# Patient Record
Sex: Female | Born: 1935 | Race: White | Hispanic: No | Smoking: Never smoker
Health system: Southern US, Community
[De-identification: ages and names within clinical notes are randomized; demographics above are authoritative.]

## PROBLEM LIST (undated history)

## (undated) DIAGNOSIS — Z950 Presence of cardiac pacemaker: Secondary | ICD-10-CM

## (undated) DIAGNOSIS — I10 Essential (primary) hypertension: Secondary | ICD-10-CM

## (undated) DIAGNOSIS — I2510 Atherosclerotic heart disease of native coronary artery without angina pectoris: Secondary | ICD-10-CM

## (undated) DIAGNOSIS — I4891 Unspecified atrial fibrillation: Secondary | ICD-10-CM

## (undated) DIAGNOSIS — E119 Type 2 diabetes mellitus without complications: Secondary | ICD-10-CM

## (undated) HISTORY — DX: Atherosclerotic heart disease of native coronary artery without angina pectoris: I25.10

## (undated) HISTORY — DX: Morbid (severe) obesity due to excess calories: E66.01

## (undated) HISTORY — DX: Presence of cardiac pacemaker: Z95.0

## (undated) HISTORY — DX: Unspecified atrial fibrillation: I48.91

## (undated) HISTORY — DX: Type 2 diabetes mellitus without complications: E11.9

## (undated) HISTORY — DX: Essential (primary) hypertension: I10

## (undated) NOTE — *Deleted (*Deleted)
[facility] PROGRESS NOTE    Progress Note  Kirsten Jensen  ZOX:[id number] DOB: Aug 15, 1932 DOA: 10/30/2020 PCP: Melida Quitter, MD     Brief Narrative:   Kirsten Jensen is an 40 y.o. female past medical history significant for chronic atrial fibrillation on Eliquis tachybradycardia syndrome status post pacemaker, essential hypertension, diabetes mellitus type 2, obstructive sleep apnea comes into the hospital due to abnormal labs her hemoglobin dropped from 11-8 she was found to be FOBT positive in the ED, started on IV Protonix drip.  Cardiology was consulted who recommended taking to new metoprolol and proceed with EGD and colonoscopy, GI was consulted the patient underwent EGD and was found to have a nonbleeding ulcer, underwent colonoscopy as that showed diverticulosis and 2 small polyps which were removed.  CT scan of the abdomen and pelvis was unremarkable.  GI did signed off her hemoglobin remained around 1:08 unit of packed red blood cells Eliquis resumed. Her discharge is anticipated on 11/05/2020  Assessment/Plan:   Active Problems:   Upper GI bleed   Chronic atrial fibrillation (HCC)  RN Pressure Injury Documentation:    Estimated body mass index is 49.38 kg/m as calculated from the following:   Height as of this encounter: 5\' 7"  (1.702 m).   Weight as of this encounter: 143 kg. Malnutrition Type:      Malnutrition Characteristics:      Nutrition Interventions:       DVT prophylaxis: *** Family Communication:*** Status is: Inpatient  {Inpatient:23812}  Dispo:  Patient From: Home  Planned Disposition: Home  Expected discharge date: 11/04/20  Medically stable for discharge: No         Code Status:     Code Status Orders  (From admission, onward)         Start     Ordered   10/31/20 0224  Full code  Continuous        10/31/20 0223        Code Status History    Date Active Date Inactive Code Status Order ID Comments User Context    04/05/2020 0332 04/06/2020 2053 Full Code [id number]  Joselyn Arrow, MD ED   Advance Care Planning Activity        IV Access:    Peripheral IV   Procedures and diagnostic studies:   CT ABDOMEN PELVIS W CONTRAST  Result Date: 11/04/2020 CLINICAL DATA:  Anemia. Possible upper GI bleed. 14 pound weight gain and chest discomfort. Candidiasis and duodenal ulcers demonstrated at endoscopy. EXAM: CT ABDOMEN AND PELVIS WITH CONTRAST TECHNIQUE: Multidetector CT imaging of the abdomen and pelvis was performed using the standard protocol following bolus administration of intravenous contrast. CONTRAST:  OMNIPAQUE IOHEXOL 300 MG/ML  SOLN COMPARISON:  None. FINDINGS: Lower chest: Small bilateral pleural effusions with basilar atelectasis. Hepatobiliary: No focal liver abnormality is seen. No gallstones, gallbladder wall thickening, or biliary dilatation. Pancreas: Diffuse fatty atrophy of the pancreas. No inflammatory change or focal mass identified. Spleen: Normal in size without focal abnormality. Adrenals/Urinary Tract: Adrenal glands are unremarkable. Kidneys are normal, without renal calculi, focal lesion, or hydronephrosis. Bladder is unremarkable. Stomach/Bowel: The stomach, small bowel, and colon are not abnormally distended. No wall thickening or inflammatory changes are identified. Diverticulosis of the sigmoid colon. No evidence of diverticulitis. Appendix is not identified. Vascular/Lymphatic: Aortic atherosclerosis. No enlarged abdominal or pelvic lymph nodes. Reproductive: Status post hysterectomy. No adnexal masses. Other: No free air or free fluid in the abdomen. Abdominal wall musculature appears intact. Musculoskeletal:  Degenerative changes in the spine. No destructive bone lesions. IMPRESSION: 1. No acute process demonstrated in the abdomen or pelvis. No evidence of bowel obstruction or inflammation. 2. Small bilateral pleural effusions with basilar atelectasis. 3. Diffuse fatty atrophy  of the pancreas. 4. Aortic atherosclerosis. Aortic Atherosclerosis (ICD10-I70.0). Electronically Signed   By: Burman Nieves M.D.   On: 11/04/2020 01:12     Medical Consultants:    None.  Anti-Infectives:   ***  Subjective:    Kirsten Jensen   Objective:    Vitals:   11/04/20 1945 11/05/20 0436 11/05/20 0500 11/05/20 0900  BP: 111/71 114/60  (!) 96/55  Pulse: 72 70  78  Resp: 18 19    Temp: 98.2 F (36.8 C) 98.5 F (36.9 C)  97.7 F (36.5 C)  TempSrc: Oral Oral  Oral  SpO2: 99% 100%  95%  Weight:   (!) 143 kg   Height:       SpO2: 95 % O2 Flow Rate (L/min): 3 L/min   Intake/Output Summary (Last 24 hours) at 11/05/2020 0944 Last data filed at 11/05/2020 0900 Gross per 24 hour  Intake 480 ml  Output 900 ml  Net -420 ml   Filed Weights   11/02/20 0359 11/02/20 1411 11/05/20 0500  Weight: (!) 142.2 kg (!) 142.2 kg (!) 143 kg    Exam: General exam: In no acute distress. Respiratory system: Good air movement and clear to auscultation. Cardiovascular system: S1 & S2 heard, RRR. No JVD, murmurs, rubs, gallops or clicks.  Gastrointestinal system: Abdomen is nondistended, soft and nontender.  Central nervous system: Alert and oriented. No focal neurological deficits. Extremities: No pedal edema. Skin: No rashes, lesions or ulcers Psychiatry: Judgement and insight appear normal. Mood & affect appropriate.    Data Reviewed:    Labs: Basic Metabolic Panel: Recent Labs  Lab 11/01/20 0216 11/01/20 0216 11/02/20 1610 11/02/20 9604 11/03/20 0940 11/03/20 0940 11/04/20 0051 11/05/20 0209  NA 137  --  139  --  138  --  135 137  K 3.5   < > 3.2*   < > 3.5   < > 3.3* 4.0  CL 100  --  99  --  99  --  98 98  CO2 26  --  30  --  26  --  26 28  GLUCOSE 192*  --  147*  --  261*  --  184* 106*  BUN 26*  --  18  --  15  --  18 20  CREATININE 1.02*  --  0.91  --  0.96  --  1.10* 0.87  CALCIUM 8.7*  --  8.8*  --  9.0  --  8.8* 9.3  MG 1.3*  --  1.8  --  1.8  --   1.7 1.5*  PHOS 2.4*  --  2.8  --  2.9  --  3.3 3.1   < > = values in this interval not displayed.   GFR Estimated Creatinine Clearance: 69 mL/min (by C-G formula based on SCr of 0.87 mg/dL). Liver Function Tests: Recent Labs  Lab 10/30/20 2027 10/31/20 0258 11/05/20 0209  AST 81* 71* 28  ALT 50* 47* 24  ALKPHOS 156* 155* 86  BILITOT 0.7 0.7 0.8  PROT 6.8 6.4* 5.8*  ALBUMIN 3.2* 3.1* 2.8*   No results for input(s): LIPASE, AMYLASE in the last 168 hours. No results for input(s): AMMONIA in the last 168 hours. Coagulation profile Recent Labs  Lab 10/31/20 0041  INR 2.0*   COVID-19 Labs  No results for input(s): DDIMER, FERRITIN, LDH, CRP in the last 72 hours.  Lab Results  Component Value Date   SARSCOV2NAA NEGATIVE 10/30/2020   SARSCOV2NAA NEGATIVE 04/05/2020    CBC: Recent Labs  Lab 10/30/20 2027 10/30/20 2027 10/31/20 0258 10/31/20 1035 11/03/20 0940 11/03/20 2057 11/04/20 0051 11/04/20 0635 11/05/20 0209  WBC 9.6  --  9.2  --  9.1  --  8.0  --  7.0  NEUTROABS  --   --  7.2  --   --   --   --   --   --   HGB 8.2*   < > 8.1*   < > 8.4* 8.2* 8.2* 8.0* 8.2*  HCT 26.7*   < > 25.9*   < > 27.7* 26.3* 26.4* 25.9* 26.5*  MCV 97.4  --  96.3  --  95.5  --  92.6  --  94.3  PLT 399  --  374  --  347  --  278  --  290   < > = values in this interval not displayed.   Cardiac Enzymes: No results for input(s): CKTOTAL, CKMB, CKMBINDEX, TROPONINI in the last 168 hours. BNP (last 3 results) No results for input(s): PROBNP in the last 8760 hours. CBG: Recent Labs  Lab 11/04/20 1659 11/04/20 1938 11/05/20 0000 11/05/20 0438 11/05/20 0815  GLUCAP 193* 199* 157* 108* 124*   D-Dimer: No results for input(s): DDIMER in the last 72 hours. Hgb A1c: No results for input(s): HGBA1C in the last 72 hours. Lipid Profile: No results for input(s): CHOL, HDL, LDLCALC, TRIG, CHOLHDL, LDLDIRECT in the last 72 hours. Thyroid function studies: No results for input(s): TSH,  T4TOTAL, T3FREE, THYROIDAB in the last 72 hours.  Invalid input(s): FREET3 Anemia work up: No results for input(s): VITAMINB12, FOLATE, FERRITIN, TIBC, IRON, RETICCTPCT in the last 72 hours. Sepsis Labs: Recent Labs  Lab 10/31/20 0258 11/03/20 0940 11/04/20 0051 11/05/20 0209  WBC 9.2 9.1 8.0 7.0   Microbiology Recent Results (from the past 240 hour(s))  Respiratory Panel by RT PCR (Flu A&B, Covid) - Nasopharyngeal Swab     Status: None   Collection Time: 10/30/20 10:52 PM   Specimen: Nasopharyngeal Swab; Nasopharyngeal(NP) swabs in vial transport medium  Result Value Ref Range Status   SARS Coronavirus 2 by RT PCR NEGATIVE NEGATIVE Final    Comment: (NOTE) SARS-CoV-2 target nucleic acids are NOT DETECTED.  The SARS-CoV-2 RNA is generally detectable in upper respiratoy specimens during the acute phase of infection. The lowest concentration of SARS-CoV-2 viral copies this assay can detect is 131 copies/mL. A negative result does not preclude SARS-Cov-2 infection and should not be used as the sole basis for treatment or other patient management decisions. A negative result may occur with  improper specimen collection/handling, submission of specimen other than nasopharyngeal swab, presence of viral mutation(s) within the areas targeted by this assay, and inadequate number of viral copies (<131 copies/mL). A negative result must be combined with clinical observations, patient history, and epidemiological information. The expected result is Negative.  Fact Sheet for Patients:  https://www.moore.com/  Fact Sheet for Healthcare Providers:  https://www.young.biz/  This test is no t yet approved or cleared by the [place] FDA and  has been authorized for detection and/or diagnosis of SARS-CoV-2 by FDA under an Emergency Use Authorization (EUA). This EUA will remain  in effect (meaning this test can be used) for the duration of the  COVID-19  declaration under Section 564(b)(1) of the Act, 21 U.S.C. section 360bbb-3(b)(1), unless the authorization is terminated or revoked sooner.     Influenza A by PCR NEGATIVE NEGATIVE Final   Influenza B by PCR NEGATIVE NEGATIVE Final    Comment: (NOTE) The Xpert Xpress SARS-CoV-2/FLU/RSV assay is intended as an aid in  the diagnosis of influenza from Nasopharyngeal swab specimens and  should not be used as a sole basis for treatment. Nasal washings and  aspirates are unacceptable for Xpert Xpress SARS-CoV-2/FLU/RSV  testing.  Fact Sheet for Patients: https://www.moore.com/  Fact Sheet for Healthcare Providers: https://www.young.biz/  This test is not yet approved or cleared by the [place] FDA and  has been authorized for detection and/or diagnosis of SARS-CoV-2 by  FDA under an Emergency Use Authorization (EUA). This EUA will remain  in effect (meaning this test can be used) for the duration of the  Covid-19 declaration under Section 564(b)(1) of the Act, 21  U.S.C. section 360bbb-3(b)(1), unless the authorization is  terminated or revoked. Performed at [facility], [address] [place], [place] [address]   Surgical pcr screen     Status: Abnormal   Collection Time: 11/01/20  5:02 AM   Specimen: Nasal Mucosa; Nasal Swab  Result Value Ref Range Status   MRSA, PCR NEGATIVE NEGATIVE Final   Staphylococcus aureus POSITIVE (A) NEGATIVE Final    Comment: (NOTE) The Xpert SA Assay (FDA approved for NASAL specimens in patients 20 years of age and older), is one component of a comprehensive surveillance program. It is not intended to diagnose infection nor to guide or monitor treatment. Performed at [facility], [address], [place], [place] [address]      Medications:   . apixaban  5 mg Oral BID  . buPROPion  100 mg Oral Daily  . chlorhexidine  15 mL Mouth Rinse BID  . Chlorhexidine Gluconate Cloth  6  each Topical Q0600  . influenza vaccine adjuvanted  0.5 mL Intramuscular Tomorrow-1000  . insulin aspart  0-9 Units Subcutaneous Q4H  . insulin detemir  10 Units Subcutaneous Daily  . levothyroxine  50 mcg Oral QAC breakfast  . mouth rinse  15 mL Mouth Rinse q12n4p  . metoprolol succinate  50 mg Oral Daily  . mupirocin ointment  1 application Nasal BID  . nystatin  5 mL Oral QID  . pantoprazole  40 mg Oral BID AC  . polyethylene glycol-electrolytes  4,000 mL Oral Once  . pravastatin  80 mg Oral Daily  . ramipril  5 mg Oral Daily  . torsemide  20 mg Oral Daily  . vitamin B-12  1,000 mcg Oral Daily   Continuous Infusions: . lactated ringers Stopped (11/01/20 0903)      LOS: 5 days   Marinda Elk  [facility]  11/05/2020, 9:44 AM

---

## 2019-09-11 ENCOUNTER — Ambulatory Visit (INDEPENDENT_AMBULATORY_CARE_PROVIDER_SITE_OTHER): Payer: Medicare Other | Admitting: Internal Medicine

## 2019-09-11 ENCOUNTER — Other Ambulatory Visit: Payer: Self-pay

## 2019-09-11 ENCOUNTER — Encounter: Payer: Self-pay | Admitting: Internal Medicine

## 2019-09-11 ENCOUNTER — Telehealth: Payer: Self-pay | Admitting: Emergency Medicine

## 2019-09-11 VITALS — BP 124/82 | HR 63 | Ht 67.0 in | Wt 303.0 lb

## 2019-09-11 DIAGNOSIS — I4891 Unspecified atrial fibrillation: Secondary | ICD-10-CM

## 2019-09-11 LAB — CUP PACEART INCLINIC DEVICE CHECK
Brady Statistic RA Percent Paced: 75 %
Brady Statistic RV Percent Paced: 20 %
Date Time Interrogation Session: 20200929040000
Implantable Lead Implant Date: 20191003
Implantable Lead Implant Date: 20191003
Implantable Lead Location: 753859
Implantable Lead Location: 753860
Implantable Lead Model: 7741
Implantable Lead Model: 7742
Implantable Lead Serial Number: 1047491
Implantable Lead Serial Number: 1074664
Implantable Pulse Generator Implant Date: 20191003
Lead Channel Impedance Value: 701 Ohm
Lead Channel Impedance Value: 788 Ohm
Lead Channel Pacing Threshold Amplitude: 0.7 V
Lead Channel Pacing Threshold Amplitude: 0.7 V
Lead Channel Pacing Threshold Pulse Width: 0.4 ms
Lead Channel Pacing Threshold Pulse Width: 0.4 ms
Lead Channel Sensing Intrinsic Amplitude: 2.5 mV
Lead Channel Sensing Intrinsic Amplitude: 8.6 mV
Lead Channel Setting Pacing Amplitude: 2 V
Lead Channel Setting Pacing Amplitude: 2 V
Lead Channel Setting Pacing Pulse Width: 0.4 ms
Lead Channel Setting Sensing Sensitivity: 2.5 mV
Pulse Gen Serial Number: 444544

## 2019-09-11 NOTE — Telephone Encounter (Signed)
Spoke with patient concerning appointment today. Patient unsure what company device is made by. Confirmed that device is a [employer].

## 2019-09-11 NOTE — Patient Instructions (Signed)

## 2019-09-11 NOTE — Progress Notes (Signed)
Patient Care Team: Patient, No Pcp Per as PCP - General (General Practice)   HPI  Kirsten Jensen is a 85 y.o. female recently moved from [place] here to establish care for a Dual chamber [employer] pacemaker implanted 2019  Her recollection is that was put in for atrial fibrillation; suspect tachybradycardia syndrome.  She has a history of coronary artery disease.  She was stented about 15 years ago and then about 1 year ago.  She has managed currently with a combination of clopidogrel and Eliquis.  No significant bleeding.  No dyspnea on exertion; her effort tolerance is limited by arthritis in her left hip and knee.  She struggles with morbid obesity.  She has treated obstructive sleep apnea  No chest pain.  Lipids managed with gemfibrozil and Pravachol (?)  Records and Results soft.  Do not have much background information.  She thinks that an echocardiogram recently was normal.  Past Medical History:  Diagnosis Date  . Atrial fibrillation ([place])   . Coronary artery disease    stenting x 2  . Diabetes ([place])   . Hypertension   . Morbid obesity ([place])   . Pacemaker     History reviewed. No pertinent surgical history.  Current Meds  Medication Sig  . acetaminophen (TYLENOL) 500 MG tablet Take 500 mg by mouth every 6 (six) hours as needed for mild pain.  Marland Kitchen acetaminophen-codeine (TYLENOL #4) 300-60 MG tablet Take 1 tablet by mouth every 4 (four) hours as needed for pain.  Marland Kitchen amLODipine (NORVASC) 5 MG tablet Take 5 mg by mouth daily.  Marland Kitchen apixaban (ELIQUIS) 5 MG TABS tablet Take 5 mg by mouth 2 (two) times daily.  Marland Kitchen buPROPion (WELLBUTRIN) 100 MG tablet Take 100 mg by mouth daily.  . Cholecalciferol (VITAMIN D-3) 25 MCG (1000 UT) CAPS Take 1 capsule by mouth daily.  . clopidogrel (PLAVIX) 75 MG tablet Take 75 mg by mouth daily.  Marland Kitchen gemfibrozil (LOPID) 600 MG tablet Take 600 mg by mouth 2 (two) times daily before a meal.  . levothyroxine (SYNTHROID) 50 MCG tablet  Take 50 mcg by mouth daily before breakfast.  . metFORMIN (GLUCOPHAGE-XR) 500 MG 24 hr tablet Take 1,000 mg by mouth 2 (two) times daily.  . Multiple Vitamins-Minerals (OCUVITE ADULT 50+ PO) Take 1 capsule by mouth daily.  . pravastatin (PRAVACHOL) 80 MG tablet Take 80 mg by mouth daily.  . ramipril (ALTACE) 5 MG capsule Take 5 mg by mouth daily.  . traMADol (ULTRAM) 50 MG tablet Take by mouth every 6 (six) hours as needed for moderate pain.  . vitamin B-12 (CYANOCOBALAMIN) 1000 MCG tablet Take 1,000 mcg by mouth daily.    No Known Allergies    Review of Systems negative except from HPI and PMH  Physical Exam BP 124/82   Pulse 63   Ht 5\' 7"  (1.702 m)   Wt (!) 303 lb (137.4 kg)   SpO2 97%   BMI 47.46 kg/m    BP 124/82   Pulse 63   Ht 5\' 7"  (1.702 m)   Wt (!) 303 lb (137.4 kg)   SpO2 97%   BMI 47.46 kg/m  Well developed and Morbidly obese in no acute distress HENT normal Neck supple  Clear Device pocket well healed; without hematoma or erythema.  There is no tethering   Regular rate and rhythm, no murmur Abd-soft with active BS No Clubbing cyanosis   edema Skin-warm and dry A & Oriented  Grossly  normal sensory and motor function  ECG atrial pacing at 63  42/14/43 Right bundle branch block Lateral wall infarct  Assessment and  Plan Atrial fibrillation-paroxysmal  Bradycardia-presumed tachybradycardia  Pacemaker-[employer]  Coronary artery disease-prior stenting x2  Morbidly obese   Device function is normal.  Interval atrial fibrillation detected.  On Anticoagulation;  No bleeding issues   Without symptoms of ischemia  Blood pressure well controlled  Will try and get records           Current medicines are reviewed at length with the patient today .  The patient does not  have concerns regarding medicines.

## 2019-09-13 ENCOUNTER — Telehealth: Payer: Self-pay | Admitting: Internal Medicine

## 2019-09-13 NOTE — Telephone Encounter (Signed)
Patient called the office with some follow-up questions from her visit Tuesday 09/29

## 2019-09-13 NOTE — Telephone Encounter (Signed)
Pt calls in to report that she forgot to mention her weight gain to Dr. Caryl Comes at her appt earlier this week. Reports that she has had an 11 pd weight gain in the last month.  Her weight was 305 lb yesterday, 303 lb at OV this past Tuesday and it was 292 a month ago. She reports BLEE 1+ pitting.  States her shoes are tight on her feet. She also is experiencing occasional SOB, but notes that she didn't have any when aerobic exercising earlier today. She also reports that she is taking Torsemide. I informed her that this was not listed on her med list.  She is going to discuss this with ALF and I will cal her back before I get off today to see if she is supposed to be taking this.

## 2019-09-13 NOTE — Telephone Encounter (Signed)
Returned call as promised. The nurse has still not come in to discuss Torsemide. Pt states that she is having many issues with this facility since arriving on 7/22.  This is not the first medication problem she has had there. She thinks she may have not had this since arriving in late July. Pt aware I will follow up tomorrow to discuss and hopefully confirm if she has had any Torsemide at all, if it was discontinued for any reason (she denies this has been d/c'd), and what dose it was. She understands this is helpful information to form a treatment plan

## 2019-09-14 NOTE — Telephone Encounter (Signed)
Left message informing pt that I would follow up with her on Monday

## 2019-09-17 NOTE — Telephone Encounter (Signed)
Pt called back and wanted to let Sherri know that the dosage of torsemide was 20 mg.

## 2019-09-17 NOTE — Telephone Encounter (Signed)
Pt spoke with facility MD who reports that Torsemide was not on her FL2 at transfer.  Pt is going to call pharmacy in [place] to see what dose she was taking. I will call facility to get latest labs. Aware I will call back to get dosing so as to further discuss w/ Caryl Comes tomorrow. Patient verbalized understanding and agreeable to plan.

## 2019-09-19 NOTE — Telephone Encounter (Signed)
Left detailed message with pt informing her that Dr. Caryl Comes would like to obtain a BMET prior to making decision.   Aware I will fax it to number given to me yesterday by facility.  Explained to pt that we would call once lab result received and reviewed by Caryl Comes.

## 2019-09-21 ENCOUNTER — Encounter: Payer: Self-pay | Admitting: Internal Medicine

## 2019-09-21 NOTE — Telephone Encounter (Signed)
Follow Up  Patient is calling back in again and wanting to follow up based on previous conversations. Please give patient a call back.

## 2019-09-21 NOTE — Progress Notes (Signed)
Echo 9/19   EF 60-65% MR mild-mod; TR mod  DATE TEST EF        11/18 MYOVIEW  60 % Focused infero-apical defect/No ischemia  9/19 Echo   60-65 % MR mild/mod; TR mod Pulm HTN mod

## 2019-09-25 ENCOUNTER — Telehealth: Payer: Self-pay | Admitting: Internal Medicine

## 2019-09-25 NOTE — Telephone Encounter (Addendum)
Patient states she was to have some lab work and an ultrasound done. Orders are not in [place]. She states she called the other day and no one has returned her call.

## 2019-09-25 NOTE — Telephone Encounter (Addendum)
Pt  had recent BMET at her Home Facility/ [facility].. ordered by Dr Caryl Comes.Marland Kitchen to determine if she should be on or what dose of Torsemide... she is not sure who drew the labs or the house MD.. she says the communication there is poor.   Pt would like a call back from the device clinic to be sure that they are now able to connect with her Pacemaker.. since she has transferred her care to [place].   Pt is also asking if Dr. Caryl Comes wanted to get another "ultrasound" on her... I did see records of her having an Echo 08/2018 and Myoview 2018... will forward to Dr. Caryl Comes to see if he would like any new orders for tests.     Called [facility] and spoke with [place] and asked her to help with the lab results so Dr. Caryl Comes can review but she says I need to talk with Bennett Scrape but she is unavailable and she will have her call us back..... I explained the importance of obtaining those results and she verbalized understanding... [phone].

## 2019-09-25 NOTE — Telephone Encounter (Signed)
I let the pt know that we was able to get her transferred into our clinic. I told her that her last transmission was on Jun 10, 2019. She wanted to know why we did not call her to let her know that the monitor was not communicating. I told her [facility] website sends [place] an alert 21 days after not communicating. I told her she has not been in our system for 21 days and did not get the alert yet. She was not happy with the answer. I asked her when she press the heart to do a transmission did any of the lines turn yellow. She did not remember. I asked her was everything hooked up properly? She states she would have to call maintenance to help her. I gave her my direct office number to call me and I will try my best to help her. I also asked was her monitor hooked up with a phone line or a cell adapter? She states she do not have a phone line so it must be the cell adapter. I told her Joey from [place] would be here Wednesday and I will ask him about her monitor. She states she would like a call from [place] as well. The pt states she is not comfortable being in our care. I told her again that I am sorry and I do understand her frustration. She asked me was Dr. Caryl Comes was going to be made aware of this. I told her I was putting everything in a phone note and he will be able to read the phone note. I told once again to call me and I will help her to the best of my abilities. The pt verbalized understanding.

## 2019-09-26 NOTE — Telephone Encounter (Signed)
I spoke with [place] and he agreed to call the pt.

## 2019-10-01 NOTE — Telephone Encounter (Signed)
LMOVM requesting call back to DC. Direct number and office hours given.  Lat[employer] monitor is up to date and transmitting as of 10/01/19 at 16:16. Next scheduled remote transmission on 12/11/19.

## 2019-10-13 ENCOUNTER — Encounter (HOSPITAL_COMMUNITY): Payer: Self-pay | Admitting: Emergency Medicine

## 2019-10-13 ENCOUNTER — Emergency Department (HOSPITAL_COMMUNITY): Payer: Medicare Other

## 2019-10-13 ENCOUNTER — Telehealth: Payer: Self-pay | Admitting: Surgery

## 2019-10-13 ENCOUNTER — Other Ambulatory Visit: Payer: Self-pay

## 2019-10-13 ENCOUNTER — Emergency Department (HOSPITAL_COMMUNITY)
Admission: EM | Admit: 2019-10-13 | Discharge: 2019-10-13 | Disposition: A | Discharge status: Home / Self Care | Payer: Medicare Other | Attending: Emergency Medicine | Admitting: Emergency Medicine

## 2019-10-13 DIAGNOSIS — I4891 Unspecified atrial fibrillation: Secondary | ICD-10-CM | POA: Insufficient documentation

## 2019-10-13 DIAGNOSIS — I2510 Atherosclerotic heart disease of native coronary artery without angina pectoris: Secondary | ICD-10-CM | POA: Diagnosis not present

## 2019-10-13 DIAGNOSIS — Z7901 Long term (current) use of anticoagulants: Secondary | ICD-10-CM | POA: Insufficient documentation

## 2019-10-13 DIAGNOSIS — Z7984 Long term (current) use of oral hypoglycemic drugs: Secondary | ICD-10-CM | POA: Diagnosis not present

## 2019-10-13 DIAGNOSIS — I10 Essential (primary) hypertension: Secondary | ICD-10-CM | POA: Diagnosis not present

## 2019-10-13 DIAGNOSIS — Z79899 Other long term (current) drug therapy: Secondary | ICD-10-CM | POA: Insufficient documentation

## 2019-10-13 DIAGNOSIS — M25512 Pain in left shoulder: Secondary | ICD-10-CM | POA: Insufficient documentation

## 2019-10-13 DIAGNOSIS — E119 Type 2 diabetes mellitus without complications: Secondary | ICD-10-CM | POA: Insufficient documentation

## 2019-10-13 DIAGNOSIS — Z950 Presence of cardiac pacemaker: Secondary | ICD-10-CM | POA: Insufficient documentation

## 2019-10-13 LAB — CBC
HCT: 33.4 % — ABNORMAL LOW (ref 36.0–46.0)
Hemoglobin: 10.9 g/dL — ABNORMAL LOW (ref 12.0–15.0)
MCH: 31.8 pg (ref 26.0–34.0)
MCHC: 32.6 g/dL (ref 30.0–36.0)
MCV: 97.4 fL (ref 80.0–100.0)
Platelets: 291 10*3/uL (ref 150–400)
RBC: 3.43 MIL/uL — ABNORMAL LOW (ref 3.87–5.11)
RDW: 13.2 % (ref 11.5–15.5)
WBC: 10.9 10*3/uL — ABNORMAL HIGH (ref 4.0–10.5)
nRBC: 0 % (ref 0.0–0.2)

## 2019-10-13 LAB — PROTIME-INR
INR: 1.2 (ref 0.8–1.2)
Prothrombin Time: 14.9 seconds (ref 11.4–15.2)

## 2019-10-13 LAB — BASIC METABOLIC PANEL
Anion gap: 14 (ref 5–15)
BUN: 22 mg/dL (ref 8–23)
CO2: 24 mmol/L (ref 22–32)
Calcium: 9.8 mg/dL (ref 8.9–10.3)
Chloride: 99 mmol/L (ref 98–111)
Creatinine, Ser: 0.81 mg/dL (ref 0.44–1.00)
GFR calc Af Amer: >60 mL/min (ref >60)
GFR calc non Af Amer: >60 mL/min (ref >60)
Glucose, Bld: 227 mg/dL — ABNORMAL HIGH (ref 70–99)
Potassium: 4.4 mmol/L (ref 3.5–5.1)
Sodium: 137 mmol/L (ref 135–145)

## 2019-10-13 LAB — TROPONIN I (HIGH SENSITIVITY)
Troponin I (High Sensitivity): 18 ng/L — ABNORMAL HIGH (ref <18)
Troponin I (High Sensitivity): 19 ng/L — ABNORMAL HIGH (ref <18)

## 2019-10-13 MED ORDER — OXYCODONE-ACETAMINOPHEN 5-325 MG PO TABS
1.0000 | ORAL_TABLET | Freq: Once | ORAL | Status: AC
  Administered 2019-10-13: 1 via ORAL
  Filled 2019-10-13: qty 1

## 2019-10-13 MED ORDER — SODIUM CHLORIDE 0.9% FLUSH: 3.0000 mL | Freq: Once | INTRAVENOUS | Status: DC

## 2019-10-13 MED ORDER — PREDNISONE 10 MG (21) PO TBPK: ORAL_TABLET | Freq: Every day | ORAL | 0 refills | Status: DC

## 2019-10-13 NOTE — Telephone Encounter (Signed)
[facility] CM received call from patient's son confirming discharge prescription confirmation. States, ALF wanting to make sure correct prescription is being filled. CM provided clarification. No further questions or concerns noted.

## 2019-10-13 NOTE — ED Provider Notes (Addendum)
[facility] EMERGENCY DEPARTMENT Provider Note   CSN: [id number] Arrival date & time: 10/13/19  0156     History   Chief Complaint Chief Complaint  Patient presents with   Shoulder Pain    HPI Kirsten Jensen is a 85 y.o. female.     HPI  85 year old female presents today complaining of 3 days of left shoulder pain.  She denies having previous similar symptoms.  She describes it as tight and sharp with any movement.  Pain is moderate to severe.  She has taken her usual medications that include tramadol without relief.  She has no known history of injury.  She does have a pacemaker that was put in approximately a year ago just below this area.  She denies any discharge, redness, or other problems with the pacemaker.  She states that she had some pain in her neck previously after a fall.  She was seen for this over a year ago.  At that time it was felt to be muscular strain and she got better from that with Flexeril and massage therapy.  She has no history of fracture or injury to the shoulder.  The pain is somewhat in the medial aspect of the clavicle also.  She denies any change in ability to move her neck.  She is not having pain or problems in her left elbow or hand. Patient lives in an assisted living care facility.  She is fairly new to this area.  She moved around the time that the pandemic occurred and has not had the ability to form strong social relationships and his family live fairly close to her facility.  She has not had much chance to interact with them due to the pandemic.  She is seen by physician, Dr. Redmond School, who comes to the facility.  She has partially blind and has difficulty with mobility, but is able to care for herself in the assisted living part of where she is Past Medical History:  Diagnosis Date   Atrial fibrillation (HCC)    Coronary artery disease    stenting x 2   Diabetes (HCC)    Hypertension    Morbid obesity (HCC)    Pacemaker      There are no active problems to display for this patient.   History reviewed. No pertinent surgical history.   OB History   No obstetric history on file.      Home Medications    Prior to Admission medications   Medication Sig Start Date End Date Taking? Authorizing Provider  acetaminophen (TYLENOL) 500 MG tablet Take 500 mg by mouth every 6 (six) hours as needed for mild pain.    [provider]  acetaminophen-codeine (TYLENOL #4) 300-60 MG tablet Take 1 tablet by mouth every 4 (four) hours as needed for pain.    [provider]  amLODipine (NORVASC) 5 MG tablet Take 5 mg by mouth daily.    [provider]  apixaban (ELIQUIS) 5 MG TABS tablet Take 5 mg by mouth 2 (two) times daily.    [provider]  buPROPion (WELLBUTRIN) 100 MG tablet Take 100 mg by mouth daily.    [provider]  Cholecalciferol (VITAMIN D-3) 25 MCG (1000 UT) CAPS Take 1 capsule by mouth daily.    [provider]  clopidogrel (PLAVIX) 75 MG tablet Take 75 mg by mouth daily.    [provider]  gemfibrozil (LOPID) 600 MG tablet Take 600 mg by mouth 2 (two)  times daily before a meal.    [provider]  levothyroxine (SYNTHROID) 50 MCG tablet Take 50 mcg by mouth daily before breakfast.    [provider]  metFORMIN (GLUCOPHAGE-XR) 500 MG 24 hr tablet Take 1,000 mg by mouth 2 (two) times daily.    [provider]  Multiple Vitamins-Minerals (OCUVITE ADULT 50+ PO) Take 1 capsule by mouth daily.    [provider]  pravastatin (PRAVACHOL) 80 MG tablet Take 80 mg by mouth daily.    [provider]  ramipril (ALTACE) 5 MG capsule Take 5 mg by mouth daily.    [provider]  traMADol (ULTRAM) 50 MG tablet Take by mouth every 6 (six) hours as needed for moderate pain.    [provider]  vitamin B-12 (CYANOCOBALAMIN) 1000 MCG tablet Take 1,000 mcg by mouth daily.    [provider]     Family History History reviewed. No pertinent family history.  Social History Social History   Tobacco Use   Smoking status: Never Smoker   Smokeless tobacco: Never Used  Substance Use Topics   Alcohol use: Not on file   Drug use: Never     Allergies   Patient has no known allergies.   Review of Systems Review of Systems  All other systems reviewed and are negative.    Physical Exam Updated Vital Signs BP (!) 193/69 (BP Location: Right Arm)    Pulse 71    Temp 98.3 F (36.8 C) (Oral)    Resp 16    LMP  (LMP Unknown)    SpO2 99%   Physical Exam Vitals signs and nursing note reviewed.  Constitutional:      General: She is not in acute distress.    Appearance: Normal appearance. She is obese. She is not ill-appearing.  HENT:     Head: Normocephalic.     Right Ear: External ear normal.     Left Ear: External ear normal.     Nose: Nose normal.     Mouth/Throat:     Mouth: Mucous membranes are dry.  Eyes:     Extraocular Movements: Extraocular movements intact.  Neck:     Comments: Patient with decreased range of movement of left shoulder with decreased abduction and external rotation External visual examination of the shoulder reveals no signs of trauma.  Is not warm or red.  There is some diffuse tenderness with palpation around the humeral head Sensation is intact in the upper extremity Radial pulses are intact There is mild tenderness that extends to the upper aspect of the pacemaker pocket There is no tenderness over the pacemaker, redness, or crepitance. Skin:    General: Skin is warm and dry.     Capillary Refill: Capillary refill takes less than 2 seconds.  Neurological:     General: No focal deficit present.     Mental Status: She is alert.  Psychiatric:        Mood and Affect: Mood normal.      ED Treatments / Results  Labs (all labs ordered are listed, but only abnormal results are displayed) Labs Reviewed  BASIC METABOLIC PANEL -  Abnormal; Notable for the following components:      Result Value   Glucose, Bld 227 (*)    All other components within normal limits  CBC - Abnormal; Notable for the following components:   WBC 10.9 (*)    RBC 3.43 (*)    Hemoglobin 10.9 (*)  HCT 33.4 (*)    All other components within normal limits  TROPONIN I (HIGH SENSITIVITY) - Abnormal; Notable for the following components:   Troponin I (High Sensitivity) 18 (*)    All other components within normal limits  TROPONIN I (HIGH SENSITIVITY) - Abnormal; Notable for the following components:   Troponin I (High Sensitivity) 19 (*)    All other components within normal limits  PROTIME-INR    EKG EKG Interpretation  Date/Time:  Saturday October 13 2019 02:57:33 EDT Ventricular Rate:  65 PR Interval:  178 QRS Duration: 144 QT Interval:  454 QTC Calculation: 472 R Axis:   3 Text Interpretation: Normal sinus rhythm Right bundle branch block Lateral infarct , age undetermined Abnormal ECG No old tracing to compare Confirmed by Delora Fuel ([id number]) on 10/13/2019 3:55:05 AM   Radiology Dg Chest 2 View  Result Date: 10/13/2019 CLINICAL DATA:  Chest and left shoulder pain EXAM: CHEST - 2 VIEW COMPARISON:  None. FINDINGS: The heart size and mediastinal contours are within normal limits. Both lungs are clear. The visualized skeletal structures are unremarkable. There is a leftchest wall pacemakerwith leads projecting within the right atrium and right ventricle. IMPRESSION: No active cardiopulmonary disease. Electronically Signed   By: Ulyses Jarred M.D.   On: 10/13/2019 02:54   Dg Shoulder Left  Result Date: 10/13/2019 CLINICAL DATA:  Left shoulder pain EXAM: LEFT SHOULDER - 2+ VIEW COMPARISON:  None. FINDINGS: There is mild glenohumeral and acromioclavicular osteoarthrosis. There is degenerative spurring along the superolateral aspect of the humeral head where it underlies the acromion. No fracture or dislocation. IMPRESSION: Mild  glenohumeral and acromioclavicular osteoarthrosis. No fracture or dislocation of the left shoulder. Electronically Signed   By: Ulyses Jarred M.D.   On: 10/13/2019 02:56    Procedures Procedures (including critical care time)  Medications Ordered in ED Medications  sodium chloride flush (NS) 0.9 % injection 3 mL (has no administration in time range)     Initial Impression / Assessment and Plan / ED Course  I have reviewed the triage vital signs and the nursing notes.  Pertinent labs & imaging results that were available during my care of the patient were reviewed by me and considered in my medical decision making (see chart for details).      85 year old woman presents today with today with left shoulder pain that appears to be located to the left shoulder joint.  There does not  appear to be a cardiac component she does have a pacemaker pocket on the side but does not appear to be infected.  She has had a history of some neck problems but this appears to be in the shoulder and not from the neck.  Given her age, comorbidity of diabetes, and presentation most woods strongly suspect adhesive capsulitis.  Plan shoulder immobilization for comfort and referral to orthopedic surgery.  She will be placed on a short course of steroids as I am hesitant to start her on anti-inflammatory due to her clopidogrel and Eliquis use High blood pressure noted.  Patient with history of same.  I suspect this is due to the situation, pain, and not taking her medications this morning.  She is advised to follow-up and have her blood pressure rechecked in her normal setting after medications  Final Clinical Impressions(s) / ED Diagnoses   Final diagnoses:  Acute pain of left shoulder    ED Discharge Orders    None       Pattricia Boss, MD 10/13/19  0854    Margarita [id number], Kassie Keng, MD 10/13/19 [id number]

## 2019-10-13 NOTE — ED Triage Notes (Addendum)
Patient reports left shoulder pain radiating to left upper chest/left upper arm for several days , denies injury , pain increases with movement . She received 324 mg ASA by EMS and Tylenol#3 at the assisted living facility. Hypertensive at arrival.

## 2019-10-13 NOTE — ED Notes (Signed)
Patient's son called to ask about d/c prescription.Pt thought she was given a script for Percocet It was clarified that PT did not receive a narcotic prescription No further questions at this time

## 2019-10-13 NOTE — Discharge Instructions (Addendum)
Please call Dr. Luanna Cole office for follow-up next week Take medications as prescribed Return if you have increased redness, fever, or swelling.

## 2019-10-13 NOTE — ED Notes (Signed)
Called [facility]tar for pt transport to [facility]

## 2019-10-16 ENCOUNTER — Telehealth: Payer: Self-pay | Admitting: Internal Medicine

## 2019-10-16 NOTE — Telephone Encounter (Signed)
Tried to call patient, voicemail is full, unable to leave message.

## 2019-10-16 NOTE — Telephone Encounter (Signed)
New Message   Patient is calling to see what Dr. Caryl Comes has suggested in reference to her taking the toresmide.. She is also wanting to know about her lab results especially her A1C's. Please call to discuss.

## 2019-10-17 NOTE — Telephone Encounter (Signed)
I spoke with pt. She is calling to follow up on lab results and torsemide dose (see previous phone notes).  Pt reports she had lab work done on 09/20/19 at facility.  I told her I did not see those results in her chart.  I told her BMP from recent ED visit was viewable in chart.  Pt reports she had been on torsemide in the past but this was not listed on paperwork when she moved to new facility in July. She has not been taking since that time and does not know previous dose As noted in previous phone note she reports weight gain and swelling. Does not weigh daily and does not know current weight. Reports she saw podiatrist recently and was told her feet were swollen.  Pt is asking what torsemide dose she should be taking. Pt reports blood sugar has also been elevated. States she is on a steroid and has a binge eating disorder.  She does see a doctor at her facility and I asked her to follow up with him for management of blood sugar and HgbA1C. Will forward to Dr Caryl Comes regarding torsemide dose

## 2019-10-18 MED ORDER — TORSEMIDE 20 MG PO TABS: 20.0000 mg | ORAL_TABLET | Freq: Every day | ORAL | 3 refills | Status: DC

## 2019-10-18 NOTE — Telephone Encounter (Signed)
Pat   Could u aks someone in Kirsten Jensen records if they could followup on the request from her offcie in [place] see the records in [place] We can start her on torsemide 20 mg daily   Her last Cr < 1.0 Would need a bmet in about 2 weeks Thanks SK

## 2019-10-18 NOTE — Telephone Encounter (Signed)
I spoke with pt and gave her medication and follow up lab information from Dr Caryl Comes. Pt resides at [facility] and requests I call facility with instructions.  Phone number is [phone].  I spoke with [place] and medication instructions and lab requests should be faxed to [phone].  [facility] provides medication for pt and can do lab work.  Will send message to medical records to follow up on records from [place].

## 2019-10-18 NOTE — Telephone Encounter (Signed)
Orders faxed

## 2019-10-26 ENCOUNTER — Telehealth: Payer: Self-pay | Admitting: Internal Medicine

## 2019-10-26 NOTE — Telephone Encounter (Signed)
Medical records received from [facility]. Records placed in Dr. Olin Pia box for review. 10/26/19 vlm

## 2019-10-29 ENCOUNTER — Telehealth: Payer: Self-pay | Admitting: Student

## 2019-10-29 NOTE — Telephone Encounter (Signed)
  Alert for RV automatic threshold detected as > programmed amplitude or suspended. Likely due to irregularity in the setting of AF.   Of note with presenting AF.   Pt states she is feeling fine, if not better than her usual self. She has not been using her CPAP machine.  She denies missing any of her medications. She has felt better on torsemide, but feels like she fluctuates within a 10 lb range.   She is able to have labs drawn at her assisted living notes per Phone note 10/18/2019 and is due labwork s/p torsemide start on 10/18/2019.   Encouraged compliance with her CPAP, and will likely add Mg to labwork with now AF.   Will forward to Dr. Caryl Comes for additional, and wait to arrange labs for later this week pending recommendations.

## 2019-10-31 NOTE — Telephone Encounter (Signed)
Jonni Sanger  plz followup with her by phone. Or we can have her go to afib clinci to assess status inafib if it persists Thanks

## 2019-11-01 NOTE — Telephone Encounter (Signed)
Discussed with patient.    No change in symptoms. She continues to have SOB with moderate exertion, but doesn't feel like it has changed for worse or better. Denies lightheadedness, dizziness, or edema.   She agrees to appointment next Tuesday at 1100 am in AF clinic.  Will also call to arrange transportation with [facility].   Legrand Como [address] [place], PA-C  11/01/2019 11:15 AM

## 2019-11-01 NOTE — Telephone Encounter (Signed)
Confirmed with patients senior living facility, [facility], and they will provider transportation to her appointment 11/24, Tuesday at 1100 am to Entrance C at [facility].   They will be dropping her off, so parking code not given.    Afib clinic number given.

## 2019-11-05 NOTE — Telephone Encounter (Signed)
Noted Thanks SK   

## 2019-11-06 ENCOUNTER — Encounter (HOSPITAL_COMMUNITY): Payer: Self-pay | Admitting: Physician Assistant

## 2019-11-06 ENCOUNTER — Other Ambulatory Visit: Payer: Self-pay

## 2019-11-06 ENCOUNTER — Ambulatory Visit (HOSPITAL_COMMUNITY)
Admission: RE | Admit: 2019-11-06 | Discharge: 2019-11-06 | Disposition: A | Discharge status: Home / Self Care | Payer: Medicare Other | Source: Ambulatory Visit | Attending: Physician Assistant | Admitting: Physician Assistant

## 2019-11-06 VITALS — BP 150/60 | HR 65 | Ht 67.0 in | Wt 297.8 lb

## 2019-11-06 DIAGNOSIS — Z79899 Other long term (current) drug therapy: Secondary | ICD-10-CM | POA: Diagnosis not present

## 2019-11-06 DIAGNOSIS — Z9119 Patient's noncompliance with other medical treatment and regimen: Secondary | ICD-10-CM | POA: Diagnosis not present

## 2019-11-06 DIAGNOSIS — Z950 Presence of cardiac pacemaker: Secondary | ICD-10-CM | POA: Insufficient documentation

## 2019-11-06 DIAGNOSIS — Z7901 Long term (current) use of anticoagulants: Secondary | ICD-10-CM | POA: Insufficient documentation

## 2019-11-06 DIAGNOSIS — I495 Sick sinus syndrome: Secondary | ICD-10-CM | POA: Diagnosis not present

## 2019-11-06 DIAGNOSIS — Z7984 Long term (current) use of oral hypoglycemic drugs: Secondary | ICD-10-CM | POA: Diagnosis not present

## 2019-11-06 DIAGNOSIS — Z6841 Body Mass Index (BMI) 40.0 and over, adult: Secondary | ICD-10-CM | POA: Diagnosis not present

## 2019-11-06 DIAGNOSIS — I10 Essential (primary) hypertension: Secondary | ICD-10-CM | POA: Diagnosis not present

## 2019-11-06 DIAGNOSIS — D6869 Other thrombophilia: Secondary | ICD-10-CM

## 2019-11-06 DIAGNOSIS — Z7902 Long term (current) use of antithrombotics/antiplatelets: Secondary | ICD-10-CM | POA: Insufficient documentation

## 2019-11-06 DIAGNOSIS — Z79890 Hormone replacement therapy (postmenopausal): Secondary | ICD-10-CM | POA: Diagnosis not present

## 2019-11-06 DIAGNOSIS — I4510 Unspecified right bundle-branch block: Secondary | ICD-10-CM | POA: Insufficient documentation

## 2019-11-06 DIAGNOSIS — I480 Paroxysmal atrial fibrillation: Secondary | ICD-10-CM | POA: Insufficient documentation

## 2019-11-06 DIAGNOSIS — R9431 Abnormal electrocardiogram [ECG] [EKG]: Secondary | ICD-10-CM | POA: Insufficient documentation

## 2019-11-06 DIAGNOSIS — E119 Type 2 diabetes mellitus without complications: Secondary | ICD-10-CM | POA: Insufficient documentation

## 2019-11-06 DIAGNOSIS — G4733 Obstructive sleep apnea (adult) (pediatric): Secondary | ICD-10-CM | POA: Insufficient documentation

## 2019-11-06 DIAGNOSIS — I2510 Atherosclerotic heart disease of native coronary artery without angina pectoris: Secondary | ICD-10-CM | POA: Diagnosis not present

## 2019-11-06 HISTORY — DX: Paroxysmal atrial fibrillation: I48.0

## 2019-11-06 HISTORY — DX: Other thrombophilia: D68.69

## 2019-11-06 NOTE — Progress Notes (Signed)
Primary Care Physician: System, Pcp Not In Primary Electrophysiologist: Dr Graciela Husbands Referring Physician: Dr Hollace Kinnier Tax is a 85 y.o. female with a history of tachybradycardia syndrome s/p PPM, CAD, OSA, HTN, DM, and paroxysmal atrial fibrillation who presents for follow up in the [facility].  The patient was initially diagnosed with atrial fibrillation remotely in [place] and has been maintained on Eliquis for a CHADS2VASC score of 6. Her AF burden on her device has previously been low at <1%. The device clinic was alerted to AF episode on 10/29/19 with RVR. Patient denied any symptoms from her arrhythmia. She has chronic SOB for years and this is stable. She reports she has not been using her CPAP. Of note, she was also recently treated for adhesive capsulitis with a steroid taper.  Today, she denies symptoms of palpitations, chest pain, orthopnea, PND, lower extremity edema, dizziness, presyncope, syncope, snoring, daytime somnolence, bleeding, or neurologic sequela. The patient is tolerating medications without difficulties and is otherwise without complaint today.    Atrial Fibrillation Risk Factors:  she does have symptoms or diagnosis of sleep apnea. she is not compliant with CPAP therapy. she does not have a history of rheumatic fever.   she has a BMI of Body mass index is 46.64 kg/m.Marland Kitchen Filed Weights   11/06/19 1053  Weight: 135.1 kg    No family history on file.   Atrial Fibrillation Management history:  Previous antiarrhythmic drugs: none Previous cardioversions: none Previous ablations: none CHADS2VASC score: 6 Anticoagulation history: Eliquis   Past Medical History:  Diagnosis Date  . Atrial fibrillation (HCC)   . Coronary artery disease    stenting x 2  . Diabetes (HCC)   . Hypertension   . Morbid obesity (HCC)   . Pacemaker    No past surgical history on file.  Current Outpatient Medications  Medication Sig  Dispense Refill  . acetaminophen (TYLENOL) 500 MG tablet Take 500 mg by mouth every 6 (six) hours as needed for mild pain.    Marland Kitchen acetaminophen-codeine (TYLENOL #4) 300-60 MG tablet Take 1 tablet by mouth every 4 (four) hours as needed for pain.    Marland Kitchen amLODipine (NORVASC) 5 MG tablet Take 5 mg by mouth daily.    Marland Kitchen apixaban (ELIQUIS) 5 MG TABS tablet Take 5 mg by mouth 2 (two) times daily.    Marland Kitchen buPROPion (WELLBUTRIN) 100 MG tablet Take 100 mg by mouth daily.    . Cholecalciferol (VITAMIN D-3) 25 MCG (1000 UT) CAPS Take 1 capsule by mouth daily.    . clopidogrel (PLAVIX) 75 MG tablet Take 75 mg by mouth daily.    Marland Kitchen gemfibrozil (LOPID) 600 MG tablet Take 600 mg by mouth 2 (two) times daily before a meal.    . levothyroxine (SYNTHROID) 50 MCG tablet Take 50 mcg by mouth daily before breakfast.    . metFORMIN (GLUCOPHAGE-XR) 500 MG 24 hr tablet Take 1,000 mg by mouth 2 (two) times daily.    . Multiple Vitamins-Minerals (OCUVITE ADULT 50+ PO) Take 1 capsule by mouth daily.    . pravastatin (PRAVACHOL) 80 MG tablet Take 80 mg by mouth daily.    . ramipril (ALTACE) 5 MG capsule Take 5 mg by mouth daily.    Marland Kitchen torsemide (DEMADEX) 20 MG tablet Take 1 tablet (20 mg total) by mouth daily. 90 tablet 3  . traMADol (ULTRAM) 50 MG tablet Take by mouth every 6 (six) hours as needed for moderate pain.    Marland Kitchen  vitamin B-12 (CYANOCOBALAMIN) 1000 MCG tablet Take 1,000 mcg by mouth daily.     No current facility-administered medications for this encounter.     No Known Allergies  Social History   Socioeconomic History  . Marital status: Widowed    Spouse name: Not on file  . Number of children: Not on file  . Years of education: Not on file  . Highest education level: Not on file  Occupational History  . Not on file  Social Needs  . Financial resource strain: Not on file  . Food insecurity    Worry: Not on file    Inability: Not on file  . Transportation needs    Medical: Not on file    Non-medical: Not  on file  Tobacco Use  . Smoking status: Never Smoker  . Smokeless tobacco: Never Used  Substance and Sexual Activity  . Alcohol use: Not Currently    Frequency: Never  . Drug use: Never  . Sexual activity: Not on file  Lifestyle  . Physical activity    Days per week: Not on file    Minutes per session: Not on file  . Stress: Not on file  Relationships  . Social Herbalist on phone: Not on file    Gets together: Not on file    Attends religious service: Not on file    Active member of club or organization: Not on file    Attends meetings of clubs or organizations: Not on file    Relationship status: Not on file  . Intimate partner violence    Fear of current or ex partner: Not on file    Emotionally abused: Not on file    Physically abused: Not on file    Forced sexual activity: Not on file  Other Topics Concern  . Not on file  Social History Narrative  . Not on file     ROS- All systems are reviewed and negative except as per the HPI above.  Physical Exam: Vitals:   11/06/19 1053  BP: (!) 150/60  Pulse: 65  SpO2: 97%  Weight: 135.1 kg  Height: 5\' 7"  (1.702 m)    GEN- The patient is well appearing obese elderly female, alert and oriented x 3 today.   Head- normocephalic, atraumatic Eyes-  Sclera clear, conjunctiva pink Ears- hearing intact Oropharynx- clear Neck- supple  Lungs- Clear to ausculation bilaterally, normal work of breathing Heart- Regular rate and rhythm, no murmurs, rubs or gallops  GI- soft, NT, ND, + BS Extremities- no clubbing, cyanosis, or edema MS- no significant deformity or atrophy Skin- no rash or lesion Psych- euthymic mood, full affect Neuro- strength and sensation are intact  Wt Readings from Last 3 Encounters:  11/06/19 135.1 kg  09/11/19 (!) 137.4 kg    EKG today demonstrates A-paced rhythm HR 65, RBBB, PR 206, QRS 134, QTc 432  Echo 08/2018 demonstrated  EF 60-65%, mild/mod MR, mod TR, mod pulm HTN  10/2017  Myoview EF 60%, infero-apical defect, no ischemia.  Epic records are reviewed at length today  Assessment and Plan:  1. Paroxysmal atrial fibrillation General education about afib provided and questions answered. Suspect recent episode triggered by steroid vs CPAP noncompliance. She is back in SR today. She was asymptomatic during her episode. Overall AF burden <1% on device interrogation. Continue Eliquis 5 mg BID  This patients CHA2DS2-VASc Score and unadjusted Ischemic Stroke Rate (% per year) is equal to 9.7 % stroke rate/year from a  score of 6  Above score calculated as 1 point each if present [CHF, HTN, DM, Vascular=MI/PAD/Aortic Plaque, Age if 65-74, or Female] Above score calculated as 2 points each if present [Age > 75, or Stroke/TIA/TE]   2. Obesity Body mass index is 46.64 kg/m. Lifestyle modification was discussed at length including regular exercise and weight reduction. Patient has not bee able to walk much 2/2 COVID-19 lockdown at her facility.   3. Obstructive sleep apnea The importance of adequate treatment of sleep apnea was discussed today in order to improve our ability to maintain sinus rhythm long term. Encouraged compliance with CPAP therapy. Patient reports she will start using nightly again.  4. Tachybradycardia syndrome S/p PPM implanted in [place] 2019. Followed by Dr Graciela HusbandsKlein and the device clinic.  5. CAD No anginal symptoms. She is on Plavix.   Follow up with Dr Graciela HusbandsKlein per recall.   Jorja Loaicky Fenton PA-C [facility] [address] [place], [place] [id number] [phone] 11/06/2019 11:25 AM

## 2019-12-11 ENCOUNTER — Ambulatory Visit (INDEPENDENT_AMBULATORY_CARE_PROVIDER_SITE_OTHER): Payer: Medicare Other | Admitting: *Deleted

## 2019-12-11 DIAGNOSIS — I480 Paroxysmal atrial fibrillation: Secondary | ICD-10-CM | POA: Diagnosis not present

## 2019-12-11 LAB — CUP PACEART REMOTE DEVICE CHECK
Battery Remaining Longevity: 96 mo
Battery Remaining Percentage: 100 %
Brady Statistic RA Percent Paced: 42 %
Brady Statistic RV Percent Paced: 12 %
Date Time Interrogation Session: 20201229011100
Implantable Lead Implant Date: 20191003
Implantable Lead Implant Date: 20191003
Implantable Lead Location: 753859
Implantable Lead Location: 753860
Implantable Lead Model: 7741
Implantable Lead Model: 7742
Implantable Lead Serial Number: 1047491
Implantable Lead Serial Number: 1074664
Implantable Pulse Generator Implant Date: 20191003
Lead Channel Impedance Value: 707 Ohm
Lead Channel Impedance Value: 768 Ohm
Lead Channel Pacing Threshold Amplitude: 0.6 V
Lead Channel Pacing Threshold Amplitude: 1 V
Lead Channel Pacing Threshold Pulse Width: 0.4 ms
Lead Channel Pacing Threshold Pulse Width: 0.4 ms
Lead Channel Setting Pacing Amplitude: 2 V
Lead Channel Setting Pacing Amplitude: 2 V
Lead Channel Setting Pacing Pulse Width: 0.4 ms
Lead Channel Setting Sensing Sensitivity: 2.5 mV
Pulse Gen Serial Number: 444544

## 2019-12-20 ENCOUNTER — Ambulatory Visit: Payer: Medicare Other | Admitting: Registered"

## 2020-01-10 ENCOUNTER — Other Ambulatory Visit: Payer: Self-pay

## 2020-01-10 ENCOUNTER — Encounter: Payer: Medicare Other | Attending: Geriatric Medicine | Admitting: Registered"

## 2020-01-10 DIAGNOSIS — E119 Type 2 diabetes mellitus without complications: Secondary | ICD-10-CM | POA: Insufficient documentation

## 2020-01-10 NOTE — Patient Instructions (Signed)
-   Aim to have 1/2 plate of non-starchy vegetables with lunch and dinner.

## 2020-01-10 NOTE — Progress Notes (Signed)
Appointment start time: 2:16  Appointment end time: 3:33  Patient was seen on 01/10/2020 for nutrition counseling pertaining to disordered eating  Primary care provider: Florentina Jenny, MD Therapist: Crecencio Mc  ROI: none Any other medical team members: none  This note is not being shared with the patient for the following reason: eating disorder diagnosis   Assessment  Moved here in July from [place]. Lives in nursing facility.   States she has belonged to [employer] (very active member). Started 49 years ago. States she used to limit carbohydrates while participating. Followed regimen of what to eat and when. States she lost 80 lbs in 5 months. 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   States she loves to swim as exercise. States she needs knees and ankles replaced. Has been widowed since age 33. States husband was alcoholic and died in car crash. Has 3 children.   States "meeting a man" was a Academic librarian for her in the past. Was about 200 lbs through high school. Lost 40 lbs afterwards.   Has had diabetes for at least 25 years. Had education over 20 years ago. Does not recall recent A1c. Checks BS 2x/day: FBS (160) and at 4pm (220-229). States they have increased recently.  Wants me to contact her facility and let her know what her needs are. States she tries to stay away from carbohydrates. States she  has been eating 3 meals in her room and its hard. States she does not want food in her room. Will eat the entree and not eat much of the salad. Tries to cut down on salad dressing. States her issue is: Education administrator they will bring her 1-2 desserts although she does not opt-in for dessert.   Reports her younger brother died when she was 54 years old. States he was the only one that made her feel like she was wanted in the family. States when he died she wasn't given permission to grieve. States she was 86 years old when weight gain happened. States she believes when you fall in  love with someone, they disappear. States she feared her dad leaving once during childhood. States she was called "excess package" and told she was an unwanted pregnancy.     Eating history: Length of time:  Previous treatments: none Goals for RD meetings: improve relationship with food   Mental health diagnosis: BED   Dietary assessment: A typical day consists of 3 meals and 0-1 snacks  Safe foods include: [place], lean beef, tofu, yogurt, chicken, venison, pineapple, popcorn, sugar-free cookies/ice cream Avoided foods include: none reported  24 hour recall:  B: oatmeal + cottage cheese + pineapple S: L: baked sweet potato + lean protein + salad with dressing S: sometimes sugar-free cookies  D: baked potato + lean protein + carrots S:  Beverages:   What Methods Do You Use To Control Your Weight (Compensatory behaviors)?  Binge  Estimated energy needs: 1600 kcal 180 g CHO 120 g pro 44 g fat  Nutrition Diagnosis: NB-1.5 Disordered eating pattern As related to binge eating disorder.  As evidenced by pt reported.  Intervention/Goals: Pt was educated and counseled on eating to nourish the body, ways to increase non-starchy vegetable intake, and ways to improve relationship with food. Pt was in agreement with goals listed.  Goals: - Aim to have 1/2 plate of non-starchy vegetables with lunch and dinner.   Meal plan:    3 meals    0-3 snacks  Monitoring  and Evaluation: Patient will follow up in 4 weeks.

## 2020-01-29 ENCOUNTER — Telehealth: Payer: Self-pay

## 2020-01-29 NOTE — Telephone Encounter (Signed)
PM alert received for increased AF burden since 12/17/19.  Pt with known history of AF, on Eliquis 5mg  BID.    Spoke with pt, she has not noticed any specific changes in HR, but has been feeling more fatigued lately and has had some SOB.  She is not sure if she has had weight as she has not weighted herself.  She also reports that she has not been compliant with CPAP as she has fallen asleep before putting it on.  Pt confirms that she has been compliant with OAC.    Pt agreeable with AF clinic visit.  Advised would request someone contact her for scheduling.

## 2020-01-29 NOTE — Telephone Encounter (Signed)
Spoke with pt, she is aware of appt 02/05/20 with Jorja Loa, PA.

## 2020-02-05 ENCOUNTER — Encounter (HOSPITAL_COMMUNITY): Payer: Self-pay | Admitting: Physician Assistant

## 2020-02-05 ENCOUNTER — Other Ambulatory Visit: Payer: Self-pay

## 2020-02-05 ENCOUNTER — Encounter: Payer: Medicare Other | Attending: Geriatric Medicine | Admitting: Registered"

## 2020-02-05 ENCOUNTER — Ambulatory Visit (HOSPITAL_COMMUNITY)
Admission: RE | Admit: 2020-02-05 | Discharge: 2020-02-05 | Disposition: A | Discharge status: Home / Self Care | Payer: Medicare Other | Source: Ambulatory Visit | Attending: Physician Assistant | Admitting: Physician Assistant

## 2020-02-05 ENCOUNTER — Encounter: Payer: Self-pay | Admitting: Registered"

## 2020-02-05 VITALS — BP 130/64 | HR 127 | Ht 67.0 in | Wt 296.4 lb

## 2020-02-05 DIAGNOSIS — I495 Sick sinus syndrome: Secondary | ICD-10-CM | POA: Diagnosis not present

## 2020-02-05 DIAGNOSIS — I10 Essential (primary) hypertension: Secondary | ICD-10-CM | POA: Insufficient documentation

## 2020-02-05 DIAGNOSIS — Z7984 Long term (current) use of oral hypoglycemic drugs: Secondary | ICD-10-CM | POA: Diagnosis not present

## 2020-02-05 DIAGNOSIS — D6869 Other thrombophilia: Secondary | ICD-10-CM

## 2020-02-05 DIAGNOSIS — G4733 Obstructive sleep apnea (adult) (pediatric): Secondary | ICD-10-CM | POA: Diagnosis not present

## 2020-02-05 DIAGNOSIS — Z79899 Other long term (current) drug therapy: Secondary | ICD-10-CM | POA: Diagnosis not present

## 2020-02-05 DIAGNOSIS — E119 Type 2 diabetes mellitus without complications: Secondary | ICD-10-CM | POA: Diagnosis not present

## 2020-02-05 DIAGNOSIS — Z6841 Body Mass Index (BMI) 40.0 and over, adult: Secondary | ICD-10-CM | POA: Diagnosis not present

## 2020-02-05 DIAGNOSIS — Z950 Presence of cardiac pacemaker: Secondary | ICD-10-CM | POA: Insufficient documentation

## 2020-02-05 DIAGNOSIS — I484 Atypical atrial flutter: Secondary | ICD-10-CM

## 2020-02-05 DIAGNOSIS — I2510 Atherosclerotic heart disease of native coronary artery without angina pectoris: Secondary | ICD-10-CM | POA: Insufficient documentation

## 2020-02-05 DIAGNOSIS — Z7901 Long term (current) use of anticoagulants: Secondary | ICD-10-CM | POA: Diagnosis not present

## 2020-02-05 DIAGNOSIS — I4819 Other persistent atrial fibrillation: Secondary | ICD-10-CM | POA: Insufficient documentation

## 2020-02-05 DIAGNOSIS — E669 Obesity, unspecified: Secondary | ICD-10-CM | POA: Diagnosis not present

## 2020-02-05 LAB — BASIC METABOLIC PANEL
Anion gap: 16 — ABNORMAL HIGH (ref 5–15)
BUN: 26 mg/dL — ABNORMAL HIGH (ref 8–23)
CO2: 22 mmol/L (ref 22–32)
Calcium: 9.6 mg/dL (ref 8.9–10.3)
Chloride: 100 mmol/L (ref 98–111)
Creatinine, Ser: 1.1 mg/dL — ABNORMAL HIGH (ref 0.44–1.00)
GFR calc Af Amer: 53 mL/min — ABNORMAL LOW (ref >60)
GFR calc non Af Amer: 46 mL/min — ABNORMAL LOW (ref >60)
Glucose, Bld: 305 mg/dL — ABNORMAL HIGH (ref 70–99)
Potassium: 4.8 mmol/L (ref 3.5–5.1)
Sodium: 138 mmol/L (ref 135–145)

## 2020-02-05 LAB — MAGNESIUM: Magnesium: 1.3 mg/dL — ABNORMAL LOW (ref 1.7–2.4)

## 2020-02-05 LAB — TSH: TSH: 2.077 u[IU]/mL (ref 0.350–4.500)

## 2020-02-05 MED ORDER — MAGNESIUM OXIDE 400 MG PO TABS: 400.0000 mg | ORAL_TABLET | Freq: Every day | ORAL | 3 refills | Status: DC

## 2020-02-05 MED ORDER — DILTIAZEM HCL ER COATED BEADS 120 MG PO CP24: 120.0000 mg | ORAL_CAPSULE | Freq: Every day | ORAL | 3 refills | Status: DC

## 2020-02-05 NOTE — Patient Instructions (Signed)
STOP amlodipine  START Cardizem (Diltiazem) 120mg  once a day

## 2020-02-05 NOTE — Addendum Note (Signed)
Encounter addended by: Shona Simpson, RN on: 02/05/2020 2:48 PM  Actions taken: Order list changed

## 2020-02-05 NOTE — Patient Instructions (Addendum)
-   Have afternoon snack between 2-3 pm.   - Afternoon snack can include a source of carbohydrates and protein such as:  Cottage cheese and pineapple  Peanut butter and crackers  Cheese and crackers  Fruit and peanut butter  Fruit and cheese  - Continue to have 1/2 plate of non-starchy vegetables with lunch and dinner

## 2020-02-05 NOTE — Progress Notes (Signed)
Primary Care Physician: System, Pcp Not In Primary Electrophysiologist: Dr Caryl Comes Referring Physician: Dr Tora Duck Kirsten Jensen is a 86 y.o. female with a history of tachybradycardia syndrome s/p PPM, CAD, OSA, HTN, DM, and paroxysmal atrial fibrillation who presents for follow up in the [facility].  The patient was initially diagnosed with atrial fibrillation remotely in [place] and has been maintained on Eliquis for a CHADS2VASC score of 6. Her AF burden on her device has previously been low at <1%. The device clinic was alerted to AF episode on 10/29/19 with RVR. Patient denied any symptoms from her arrhythmia. She has chronic SOB for years and this is stable. She reports she has not been using her CPAP. Of note, she was also recently treated for adhesive capsulitis with a steroid taper.  On follow up today, device clinic received alert for increased AT/AF burden on device since 12/17/19. Patient denies any symptoms from her arrhythmia. Her SOB is present but has been chronic and stable for years. She denies orthopnea or increased lower extremity edema. She has had no heart racing or palpitations. Patient does report her son-in-law passed away at the end of 2023-12-03.   Today, she denies symptoms of palpitations, chest pain, orthopnea, PND, lower extremity edema, dizziness, presyncope, syncope, snoring, daytime somnolence, bleeding, or neurologic sequela. The patient is tolerating medications without difficulties and is otherwise without complaint today.    Atrial Fibrillation Risk Factors:  she does have symptoms or diagnosis of sleep apnea. she is not compliant with CPAP therapy. she does not have a history of rheumatic fever.   she has a BMI of Body mass index is 46.42 kg/m.Marland Kitchen Filed Weights   02/05/20 0920  Weight: 134.4 kg    No family history on file.   Atrial Fibrillation Management history:  Previous antiarrhythmic drugs: none Previous  cardioversions: none Previous ablations: none CHADS2VASC score: 6 Anticoagulation history: Eliquis   Past Medical History:  Diagnosis Date  . Atrial fibrillation ([place])   . Coronary artery disease    stenting x 2  . Diabetes ([place])   . Hypertension   . Morbid obesity ([place])   . Pacemaker    No past surgical history on file.  Current Outpatient Medications  Medication Sig Dispense Refill  . acetaminophen (TYLENOL) 500 MG tablet Take 500 mg by mouth every 6 (six) hours as needed for mild pain.    Marland Kitchen acetaminophen-codeine (TYLENOL #4) 300-60 MG tablet Take 1 tablet by mouth every 4 (four) hours as needed for pain.    Marland Kitchen apixaban (ELIQUIS) 5 MG TABS tablet Take 5 mg by mouth 2 (two) times daily.    Marland Kitchen buPROPion (WELLBUTRIN) 100 MG tablet Take 100 mg by mouth daily.    . Cholecalciferol (VITAMIN D-3) 25 MCG (1000 UT) CAPS Take 1 capsule by mouth daily.    . clopidogrel (PLAVIX) 75 MG tablet Take 75 mg by mouth daily.    Marland Kitchen gemfibrozil (LOPID) 600 MG tablet Take 600 mg by mouth 2 (two) times daily before a meal.    . levothyroxine (SYNTHROID) 50 MCG tablet Take 50 mcg by mouth daily before breakfast.    . metFORMIN (GLUCOPHAGE-XR) 500 MG 24 hr tablet Take 1,000 mg by mouth 2 (two) times daily.    . Multiple Vitamins-Minerals (OCUVITE ADULT 50+ PO) Take 1 capsule by mouth daily.    . pravastatin (PRAVACHOL) 80 MG tablet Take 80 mg by mouth daily.    . ramipril (ALTACE)  5 MG capsule Take 5 mg by mouth daily.    Marland Kitchen torsemide (DEMADEX) 20 MG tablet Take 1 tablet (20 mg total) by mouth daily. 90 tablet 3  . traMADol (ULTRAM) 50 MG tablet Take by mouth every 6 (six) hours as needed for moderate pain.    . vitamin B-12 (CYANOCOBALAMIN) 1000 MCG tablet Take 1,000 mcg by mouth daily.    Marland Kitchen diltiazem (CARDIZEM CD) 120 MG 24 hr capsule Take 1 capsule (120 mg total) by mouth daily. 30 capsule 3   No current facility-administered medications for this encounter.    No Known Allergies  Social History    Socioeconomic History  . Marital status: Widowed    Spouse name: Not on file  . Number of children: Not on file  . Years of education: Not on file  . Highest education level: Not on file  Occupational History  . Not on file  Tobacco Use  . Smoking status: Never Smoker  . Smokeless tobacco: Never Used  Substance and Sexual Activity  . Alcohol use: Not Currently  . Drug use: Never  . Sexual activity: Not on file  Other Topics Concern  . Not on file  Social History Narrative  . Not on file   Social Determinants of Health   Financial Resource Strain:   . Difficulty of Paying Living Expenses: Not on file  Food Insecurity:   . Worried About Programme researcher, broadcasting/film/video in the Last Year: Not on file  . Ran Out of Food in the Last Year: Not on file  Transportation Needs:   . Lack of Transportation (Medical): Not on file  . Lack of Transportation (Non-Medical): Not on file  Physical Activity:   . Days of Exercise per Week: Not on file  . Minutes of Exercise per Session: Not on file  Stress:   . Feeling of Stress : Not on file  Social Connections:   . Frequency of Communication with Friends and Family: Not on file  . Frequency of Social Gatherings with Friends and Family: Not on file  . Attends Religious Services: Not on file  . Active Member of Clubs or Organizations: Not on file  . Attends Banker Meetings: Not on file  . Marital Status: Not on file  Intimate Partner Violence:   . Fear of Current or Ex-Partner: Not on file  . Emotionally Abused: Not on file  . Physically Abused: Not on file  . Sexually Abused: Not on file     ROS- All systems are reviewed and negative except as per the HPI above.  Physical Exam: Vitals:   02/05/20 0920  BP: 130/64  Pulse: (!) 127  Weight: 134.4 kg  Height: 5\' 7"  (1.702 m)    GEN- The patient is well appearing obese elderly female, alert and oriented x 3 today.   HEENT-head normocephalic, atraumatic, sclera clear,  conjunctiva pink, hearing intact, trachea midline. Lungs- Clear to ausculation bilaterally, normal work of breathing Heart- Regular rate and rhythm, tachycardia, no murmurs, rubs or gallops  GI- soft, NT, ND, + BS Extremities- no clubbing, cyanosis. Trace bilateral edema MS- no significant deformity or atrophy Skin- no rash or lesion Psych- euthymic mood, full affect Neuro- strength and sensation are intact   Wt Readings from Last 3 Encounters:  02/05/20 134.4 kg  11/06/19 135.1 kg  09/11/19 (!) 137.4 kg    EKG today demonstrates atypical atrial flutter vs atrial tachycardia HR 127, RBBB, QRS 134, QTc 470  Echo 08/2018 demonstrated  EF 60-65%, mild/mod MR, mod TR, mod pulm HTN  10/2017 Myoview EF 60%, infero-apical defect, no ischemia.  Epic records are reviewed at length today  Assessment and Plan:  1. Persistent atrial fibrillation/atrial tach Patient asymptomatic but she does have RVR at times. ? Related to recent death in the family. Overall mean rate per device 99 bpm.  Will change Norvasc to diltiazem 120 mg daily for rate control.  We discussed therapeutic options today. It is the patients preference to pursue a conservative approach to her atrial arrhythmias given her age and paucity of symptoms. Will rate control for now.  Check TSH/mag/Bmet today. Continue Eliquis 5 mg BID  This patients CHA2DS2-VASc Score and unadjusted Ischemic Stroke Rate (% per year) is equal to 9.7 % stroke rate/year from a score of 6  Above score calculated as 1 point each if present [CHF, HTN, DM, Vascular=MI/PAD/Aortic Plaque, Age if 65-74, or Female] Above score calculated as 2 points each if present [Age > 75, or Stroke/TIA/TE]   2. Obesity Body mass index is 46.42 kg/m. Lifestyle modification was discussed and encouraged including regular physical activity and weight reduction.  3. Obstructive sleep apnea The importance of adequate treatment of sleep apnea was discussed today in  order to improve our ability to maintain sinus rhythm long term. Encouraged CPAP compliance.   4. Tachybradycardia syndrome S/p PPM implanted in [place] 2019.  Followed by Dr Graciela Husbands and the device clinic.  5. CAD No anginal symptoms.   Follow up with AF clinic in two weeks. Dr Graciela Husbands per recall.   Kirsten Loa PA-C [facility] [address] [place], [place] [address] [phone] 02/05/2020 10:36 AM

## 2020-02-05 NOTE — Progress Notes (Signed)
  Appointment start time: 10:55  Appointment end time: 12:15  Patient was seen on 02/05/2020 for nutrition counseling pertaining to disordered eating  Primary care provider: Florentina Jenny, MD Therapist: Crecencio Mc; weekly appts  ROI: none Any other medical team members: none  This note is not being shared with the patient for the following reason: eating disorder diagnosis   Assessment  Pt arrives stating her living facility allows her to have visitors now; glad to be able to have family visit. States she ordered food from [employer] recently: ordered salad + tomato basil soup + coconut curry chicken soup. States she has been having 1/2 plate of non-starchy vegetables. Reports BS numbers have been fluctuating, lowest has been 127. States she was told to aim for her FBS to stay around 135 due to age. Reports other BS numbers are around 189-225. States she has been more in touch with emotions lately and not eating as much dessert as before. Reports some physical activity during the week. States she didn't sleep well last night.   Reports she wants to lose weight in 18 months to be about 10 lbs lighter or 1-2 sizes smaller for family event. States she feels better about herself when she is smaller.    Eating history: Length of time:  Previous treatments: none Goals for RD meetings: improve relationship with food   Mental health diagnosis: BED   Dietary assessment: A typical day consists of 3 meals and 0-1 snacks  Safe foods include: [place], lean beef, tofu, yogurt, chicken, venison, pineapple, popcorn, sugar-free cookies/ice cream Avoided foods include: none reported  24 hour recall:  B: 1 c cottage cheese + 1 c pineapple + 1 c oatmeal S: Dinner (12 pm): chicken tetrazini + penne pasta + peas + salad S: sometimes sugar-free cookies  Supper (5 pm): beeft tips + carrots + salad + no-bake chocolate PB cookie  S:  Beverages: not reported  Physical activity: 15 min chair  exercises about 3 days/week  What Methods Do You Use To Control Your Weight (Compensatory behaviors)?  Binge  Estimated energy needs: 1600 kcal 180 g CHO 120 g pro 44 g fat  Nutrition Diagnosis: NB-1.5 Disordered eating pattern As related to binge eating disorder.  As evidenced by pt reported.  Intervention/Goals: Pt was encouraged with being intentional about having non-starchy vegetables with lunch and dinner. Was educated about ways to help manage BS numbers well and how to have afternoon snack between lunch and dinner to help with appetite. Pt was in agreement with goals listed.  Goals: - Have afternoon snack between 2-3 pm.  - Afternoon snack can include a source of carbohydrates and protein such as:  Cottage cheese and pineapple  Peanut butter and crackers  Cheese and crackers  Fruit and peanut butter  Fruit and cheese - Continue to have 1/2 plate of non-starchy vegetables with lunch and dinner  Meal plan:    3 meals    1-2 snacks  Monitoring and Evaluation: Patient will follow up in 3 weeks.

## 2020-02-19 ENCOUNTER — Encounter (HOSPITAL_COMMUNITY): Payer: Self-pay | Admitting: Physician Assistant

## 2020-02-19 ENCOUNTER — Ambulatory Visit (HOSPITAL_COMMUNITY)
Admission: RE | Admit: 2020-02-19 | Discharge: 2020-02-19 | Disposition: A | Discharge status: Home / Self Care | Payer: Medicare Other | Source: Ambulatory Visit | Attending: Physician Assistant | Admitting: Physician Assistant

## 2020-02-19 ENCOUNTER — Other Ambulatory Visit: Payer: Self-pay

## 2020-02-19 VITALS — BP 150/78 | HR 108 | Ht 67.0 in | Wt 302.4 lb

## 2020-02-19 DIAGNOSIS — I484 Atypical atrial flutter: Secondary | ICD-10-CM | POA: Diagnosis not present

## 2020-02-19 DIAGNOSIS — E669 Obesity, unspecified: Secondary | ICD-10-CM | POA: Insufficient documentation

## 2020-02-19 DIAGNOSIS — I4819 Other persistent atrial fibrillation: Secondary | ICD-10-CM | POA: Diagnosis present

## 2020-02-19 DIAGNOSIS — Z7984 Long term (current) use of oral hypoglycemic drugs: Secondary | ICD-10-CM | POA: Insufficient documentation

## 2020-02-19 DIAGNOSIS — Z950 Presence of cardiac pacemaker: Secondary | ICD-10-CM | POA: Diagnosis not present

## 2020-02-19 DIAGNOSIS — Z7902 Long term (current) use of antithrombotics/antiplatelets: Secondary | ICD-10-CM | POA: Insufficient documentation

## 2020-02-19 DIAGNOSIS — Z6841 Body Mass Index (BMI) 40.0 and over, adult: Secondary | ICD-10-CM | POA: Diagnosis not present

## 2020-02-19 DIAGNOSIS — I2510 Atherosclerotic heart disease of native coronary artery without angina pectoris: Secondary | ICD-10-CM | POA: Diagnosis not present

## 2020-02-19 DIAGNOSIS — E118 Type 2 diabetes mellitus with unspecified complications: Secondary | ICD-10-CM | POA: Diagnosis not present

## 2020-02-19 DIAGNOSIS — Z7901 Long term (current) use of anticoagulants: Secondary | ICD-10-CM | POA: Insufficient documentation

## 2020-02-19 DIAGNOSIS — Z79899 Other long term (current) drug therapy: Secondary | ICD-10-CM | POA: Insufficient documentation

## 2020-02-19 DIAGNOSIS — I10 Essential (primary) hypertension: Secondary | ICD-10-CM | POA: Diagnosis not present

## 2020-02-19 DIAGNOSIS — I495 Sick sinus syndrome: Secondary | ICD-10-CM | POA: Insufficient documentation

## 2020-02-19 DIAGNOSIS — G4733 Obstructive sleep apnea (adult) (pediatric): Secondary | ICD-10-CM | POA: Diagnosis not present

## 2020-02-19 DIAGNOSIS — D6869 Other thrombophilia: Secondary | ICD-10-CM

## 2020-02-19 NOTE — Progress Notes (Signed)
Primary Care Physician: System, Pcp Not In Primary Electrophysiologist: Dr Graciela Husbands Referring Physician: Dr Hollace Kinnier Kirsten Jensen is a 86 y.o. female with a history of tachybradycardia syndrome s/p PPM, CAD, OSA, HTN, DM, and paroxysmal atrial fibrillation who presents for follow up in the [facility].  The patient was initially diagnosed with atrial fibrillation remotely in [place] and has been maintained on Eliquis for a CHADS2VASC score of 6. Her AF burden on her device has previously been low at <1%. The device clinic was alerted to AF episode on 10/29/19 with RVR. Patient denied any symptoms from her arrhythmia. She has chronic SOB for years and this is stable. She reports she has not been using her CPAP. Of note, she was also recently treated for adhesive capsulitis with a steroid taper. Device clinic received alert for increased AT/AF burden on device since 12/17/19. Patient denies any symptoms from her arrhythmia. She was started on diltiazem for rate control.  On follow up today, patient reports that she has done well since her last visit. Her heart rate and blood pressure have been well controlled at the facility, she brings in the log today for review. She believes her heart rate is mildly elevated today because she has not yet had her diltiazem. She states that she has a Orthoptist now and has been walking more recently.    Today, she denies symptoms of palpitations, chest pain, orthopnea, PND, lower extremity edema, dizziness, presyncope, syncope, snoring, daytime somnolence, bleeding, or neurologic sequela. The patient is tolerating medications without difficulties and is otherwise without complaint today.    Atrial Fibrillation Risk Factors:  she does have symptoms or diagnosis of sleep apnea. she is not compliant with CPAP therapy. she does not have a history of rheumatic fever.   she has a BMI of Body mass index is 47.36 kg/m.Marland Kitchen Filed Weights   02/19/20 0918  Weight: (!) 137.2 kg    No family history on file.   Atrial Fibrillation Management history:  Previous antiarrhythmic drugs: none Previous cardioversions: none Previous ablations: none CHADS2VASC score: 6 Anticoagulation history: Eliquis   Past Medical History:  Diagnosis Date  . Atrial fibrillation (HCC)   . Coronary artery disease    stenting x 2  . Diabetes (HCC)   . Hypertension   . Morbid obesity (HCC)   . Pacemaker    No past surgical history on file.  Current Outpatient Medications  Medication Sig Dispense Refill  . acetaminophen (TYLENOL) 500 MG tablet Take 500 mg by mouth every 6 (six) hours as needed for mild pain.    Marland Kitchen acetaminophen-codeine (TYLENOL #4) 300-60 MG tablet Take 1 tablet by mouth every 4 (four) hours as needed for pain.    Marland Kitchen apixaban (ELIQUIS) 5 MG TABS tablet Take 5 mg by mouth 2 (two) times daily.    Marland Kitchen buPROPion (WELLBUTRIN) 100 MG tablet Take 100 mg by mouth daily.    . Cholecalciferol (VITAMIN D-3) 25 MCG (1000 UT) CAPS Take 1 capsule by mouth daily.    . clopidogrel (PLAVIX) 75 MG tablet Take 75 mg by mouth daily.    Marland Kitchen diltiazem (CARDIZEM CD) 120 MG 24 hr capsule Take 1 capsule (120 mg total) by mouth daily. 30 capsule 3  . gemfibrozil (LOPID) 600 MG tablet Take 600 mg by mouth 2 (two) times daily before a meal.    . levothyroxine (SYNTHROID) 50 MCG tablet Take 50 mcg by mouth daily before breakfast.    .  magnesium oxide (MAG-OX) 400 MG tablet Take 1 tablet (400 mg total) by mouth daily. 30 tablet 3  . metFORMIN (GLUCOPHAGE-XR) 500 MG 24 hr tablet Take 1,000 mg by mouth 2 (two) times daily.    . Multiple Vitamins-Minerals (OCUVITE ADULT 50+ PO) Take 1 capsule by mouth daily.    . pravastatin (PRAVACHOL) 80 MG tablet Take 80 mg by mouth daily.    . ramipril (ALTACE) 5 MG capsule Take 5 mg by mouth daily.    Marland Kitchen torsemide (DEMADEX) 20 MG tablet Take 1 tablet (20 mg total) by mouth daily. 90 tablet 3  . traMADol (ULTRAM) 50 MG tablet  Take by mouth every 6 (six) hours as needed for moderate pain.    . vitamin B-12 (CYANOCOBALAMIN) 1000 MCG tablet Take 1,000 mcg by mouth daily.     No current facility-administered medications for this encounter.    No Known Allergies  Social History   Socioeconomic History  . Marital status: Widowed    Spouse name: Not on file  . Number of children: Not on file  . Years of education: Not on file  . Highest education level: Not on file  Occupational History  . Not on file  Tobacco Use  . Smoking status: Never Smoker  . Smokeless tobacco: Never Used  Substance and Sexual Activity  . Alcohol use: Not Currently  . Drug use: Never  . Sexual activity: Not on file  Other Topics Concern  . Not on file  Social History Narrative  . Not on file   Social Determinants of Health   Financial Resource Strain:   . Difficulty of Paying Living Expenses: Not on file  Food Insecurity:   . Worried About Programme researcher, broadcasting/film/video in the Last Year: Not on file  . Ran Out of Food in the Last Year: Not on file  Transportation Needs:   . Lack of Transportation (Medical): Not on file  . Lack of Transportation (Non-Medical): Not on file  Physical Activity:   . Days of Exercise per Week: Not on file  . Minutes of Exercise per Session: Not on file  Stress:   . Feeling of Stress : Not on file  Social Connections:   . Frequency of Communication with Friends and Family: Not on file  . Frequency of Social Gatherings with Friends and Family: Not on file  . Attends Religious Services: Not on file  . Active Member of Clubs or Organizations: Not on file  . Attends Banker Meetings: Not on file  . Marital Status: Not on file  Intimate Partner Violence:   . Fear of Current or Ex-Partner: Not on file  . Emotionally Abused: Not on file  . Physically Abused: Not on file  . Sexually Abused: Not on file     ROS- All systems are reviewed and negative except as per the HPI above.  Physical  Exam: Vitals:   02/19/20 0918  BP: (!) 150/78  Pulse: (!) 108  Weight: (!) 137.2 kg  Height: 5\' 7"  (1.702 m)    GEN- The patient is well appearing obese elderly female, alert and oriented x 3 today.   HEENT-head normocephalic, atraumatic, sclera clear, conjunctiva pink, hearing intact, trachea midline. Lungs- Clear to ausculation bilaterally, normal work of breathing Heart- irregular rate and rhythm, no murmurs, rubs or gallops  GI- soft, NT, ND, + BS Extremities- no clubbing, cyanosis. Non pitting edema MS- no significant deformity or atrophy Skin- no rash or lesion Psych- euthymic mood,  full affect Neuro- strength and sensation are intact   Wt Readings from Last 3 Encounters:  02/19/20 (!) 137.2 kg  02/05/20 134.4 kg  11/06/19 135.1 kg    EKG today demonstrates atypical atrial flutter vs coarse afib HR 108, RBBB, QRS 134, QTc 517  Echo 08/2018 demonstrated  EF 60-65%, mild/mod MR, mod TR, mod pulm HTN  10/2017 Myoview EF 60%, infero-apical defect, no ischemia.  Epic records are reviewed at length today  Assessment and Plan:  1. Persistent atrial fibrillation/atrial tach Patient brings in log from facility which shows heart rates in the 80s-low 90s. She feels well and is increasing her activity.   Continue diltiazem 120 mg daily. Recall discussion that the patients preference to pursue a conservative approach to her atrial arrhythmias given her age and paucity of symptoms. Continue Eliquis 5 mg BID  This patients CHA2DS2-VASc Score and unadjusted Ischemic Stroke Rate (% per year) is equal to 9.7 % stroke rate/year from a score of 6  Above score calculated as 1 point each if present [CHF, HTN, DM, Vascular=MI/PAD/Aortic Plaque, Age if 65-74, or Female] Above score calculated as 2 points each if present [Age > 75, or Stroke/TIA/TE]  2. Obesity Body mass index is 47.36 kg/m. Lifestyle modification was discussed and encouraged including regular physical activity and  weight reduction. Patient increasing her physical activity.   3. Obstructive sleep apnea Patient is not compliant with CPAP therapy.  4. Tachybradycardia syndrome S/p PPM implanted in [place] 2019.  Followed by Dr Caryl Comes and the device clinic.  5. CAD No anginal symptoms.   Follow up with Dr Caryl Comes as scheduled.    [facility] [address] [place], [place] [address] [phone] 02/19/2020 9:27 AM

## 2020-02-26 ENCOUNTER — Encounter: Payer: Medicare Other | Attending: Internal Medicine | Admitting: Registered"

## 2020-02-26 ENCOUNTER — Encounter: Payer: Self-pay | Admitting: Registered"

## 2020-02-26 ENCOUNTER — Other Ambulatory Visit: Payer: Self-pay

## 2020-02-26 DIAGNOSIS — E119 Type 2 diabetes mellitus without complications: Secondary | ICD-10-CM | POA: Insufficient documentation

## 2020-02-26 NOTE — Progress Notes (Signed)
  Appointment start time: 2:15  Appointment end time: 3:00   Patient was seen on 02/26/2020 for nutrition counseling pertaining to disordered eating  Primary care provider: Florentina Jenny, MD Therapist: Crecencio Mc; weekly appts  ROI: none Any other medical team members: none  This note is not being shared with the patient for the following reason: eating disorder diagnosis   Assessment  Pt arrives with recent BS numbers: FBS (178-254) states this is taken prior to taking metformin. Also reports BS at 4 pm (145-276). States A1c was 7.7 in Dec. States she feels stressed. States she would like her BS taken before lunch and at 3 pm, prior to having a snack. States she threw away cheese recently. States she is in the process of getting established with a new PCP.    Previous appt: Reports she wants to lose weight in 18 months to be about 10 lbs lighter or 1-2 sizes smaller for family event. States she feels better about herself when she is smaller.    Eating history: Length of time:  Previous treatments: none Goals for RD meetings: improve relationship with food   Mental health diagnosis: BED   Dietary assessment: A typical day consists of 3 meals and 0-1 snacks  Safe foods include: [place], lean beef, tofu, yogurt, chicken, venison, pineapple, popcorn, sugar-free cookies/ice cream Avoided foods include: none reported  24 hour recall:  B: 1 c cottage cheese + 1 c pineapple + 1 c oatmeal S: Dinner (12 pm): pork loin + 2 c salad + chicnek driessing Supper (5 pm): ricotta cheese, mozzarella cheese, spinach + roasted potatoes, cabbage S: club crackers , 1/2 cheese platter + 1/2 can diet pepsi  Beverages: unsweetened iced tea (2 glasses), 2 c coffee (with 2 sweet n low), 1 oz  juice and 1/2 can diet pepis  Physical activity: 15 min chair exercises about 3 days/week  What Methods Do You Use To Control Your Weight (Compensatory behaviors)?  Binge  Estimated energy needs: 1600  kcal 180 g CHO 120 g pro 44 g fat  Nutrition Diagnosis: NB-1.5 Disordered eating pattern As related to binge eating disorder.  As evidenced by pt reported.  Intervention/Goals: Mainly listened. Discussed ADA recommendations for A1c, fasting blood sugar numbers, and after meal blood sugar numbers. Educated on why blood sugars were being checked at the the appointed times and how blood sugar works in the body related to when a meal/snack is eaten.   Meal plan:    3 meals    1-2 snacks  Monitoring and Evaluation: Patient will follow up in 4 weeks.

## 2020-02-27 ENCOUNTER — Telehealth: Payer: Self-pay | Admitting: Registered"

## 2020-02-27 NOTE — Telephone Encounter (Signed)
Magnolia from [employer] called to confirm patient's next visit for 4/13. Magnolia informed patient left behind her folder from [employer] at visit yesterday. Offer was made to mail information. Per Magnolia discard information it changes monthly and they will provide patient with updated information.

## 2020-03-11 ENCOUNTER — Ambulatory Visit (INDEPENDENT_AMBULATORY_CARE_PROVIDER_SITE_OTHER): Payer: Medicare Other | Admitting: *Deleted

## 2020-03-11 DIAGNOSIS — I480 Paroxysmal atrial fibrillation: Secondary | ICD-10-CM

## 2020-03-11 LAB — CUP PACEART REMOTE DEVICE CHECK
Battery Remaining Longevity: 66 mo
Battery Remaining Percentage: 100 %
Brady Statistic RA Percent Paced: 14 %
Brady Statistic RV Percent Paced: 7 %
Date Time Interrogation Session: 20210330011100
Implantable Lead Implant Date: 20191003
Implantable Lead Implant Date: 20191003
Implantable Lead Location: 753859
Implantable Lead Location: 753860
Implantable Lead Model: 7741
Implantable Lead Model: 7742
Implantable Lead Serial Number: 1047491
Implantable Lead Serial Number: 1074664
Implantable Pulse Generator Implant Date: 20191003
Lead Channel Impedance Value: 646 Ohm
Lead Channel Impedance Value: 761 Ohm
Lead Channel Pacing Threshold Amplitude: 1 V
Lead Channel Pacing Threshold Pulse Width: 0.4 ms
Lead Channel Setting Pacing Amplitude: 2 V
Lead Channel Setting Pacing Amplitude: 2 V
Lead Channel Setting Pacing Pulse Width: 0.4 ms
Lead Channel Setting Sensing Sensitivity: 2.5 mV
Pulse Gen Serial Number: 444544

## 2020-03-11 NOTE — Progress Notes (Signed)
PPM remote 

## 2020-03-17 DIAGNOSIS — I495 Sick sinus syndrome: Secondary | ICD-10-CM

## 2020-03-17 DIAGNOSIS — Z950 Presence of cardiac pacemaker: Secondary | ICD-10-CM | POA: Insufficient documentation

## 2020-03-17 HISTORY — DX: Sick sinus syndrome: I49.5

## 2020-03-18 ENCOUNTER — Encounter: Payer: Self-pay | Admitting: Internal Medicine

## 2020-03-18 ENCOUNTER — Ambulatory Visit (INDEPENDENT_AMBULATORY_CARE_PROVIDER_SITE_OTHER): Payer: Medicare Other | Admitting: Internal Medicine

## 2020-03-18 ENCOUNTER — Other Ambulatory Visit: Payer: Self-pay

## 2020-03-18 VITALS — BP 134/88 | HR 96 | Ht 67.0 in | Wt 305.0 lb

## 2020-03-18 DIAGNOSIS — I480 Paroxysmal atrial fibrillation: Secondary | ICD-10-CM | POA: Diagnosis not present

## 2020-03-18 DIAGNOSIS — Z950 Presence of cardiac pacemaker: Secondary | ICD-10-CM | POA: Diagnosis not present

## 2020-03-18 DIAGNOSIS — I495 Sick sinus syndrome: Secondary | ICD-10-CM
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
## 2020-03-18 NOTE — Patient Instructions (Signed)
Medication Instructions:  Your physician recommends that you continue on your current medications as directed. Please refer to the Current Medication list given to you today.  Labwork: None ordered.  Testing/Procedures: None ordered.  Follow-Up: Your physician wants you to follow-up in: 12 months with Dr Graciela Husbands. You will receive a reminder letter in the mail two months in advance. If you don't receive a letter, please call our office to schedule the follow-up appointment.  Remote monitoring is used to monitor your Pacemaker of ICD from home. This monitoring reduces the number of office visits required to check your device to one time per year. It allows [place] to keep an eye on the functioning of your device to ensure it is working properly.   Any Other Special Instructions Will Be Listed Below (If Applicable).  Social Worker at [employer] [phone]  Eileen Stanford [phone]  If you need a refill on your cardiac medications before your next appointment, please call your pharmacy.

## 2020-03-21 ENCOUNTER — Telehealth: Payer: Self-pay | Admitting: Licensed Clinical Social Worker
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
## 2020-03-25 ENCOUNTER — Other Ambulatory Visit: Payer: Self-pay

## 2020-03-25 ENCOUNTER — Encounter: Payer: Self-pay | Admitting: Registered"

## 2020-03-25 ENCOUNTER — Encounter: Payer: Medicare Other | Attending: Internal Medicine | Admitting: Registered"

## 2020-03-25 DIAGNOSIS — E119 Type 2 diabetes mellitus without complications: Secondary | ICD-10-CM | POA: Diagnosis not present

## 2020-03-25 NOTE — Patient Instructions (Addendum)
-   Continue to listen to your body for satiety.

## 2020-03-25 NOTE — Progress Notes (Signed)
  Appointment start time: 2:15  Appointment end time: 3:00   Patient was seen on 03/25/2020 for nutrition counseling pertaining to disordered eating  Primary care provider: Florentina Jenny, MD Therapist: Crecencio Mc; weekly appts  ROI: none Any other medical team members: none  This note is not being shared with the patient for the following reason: eating disorder diagnosis   Assessment   States she has become established with new PCP, Dorinda Hill, MD. [facility] cardiologist and dermatologist last week.  Reports recent BS as FBS (158-202) and at 4pm (135-224); decreased from previous visit. States therapist is trying to help her with impulse control. States son was making fun of her for seeing a dietitian. States she is not happy with the way her clothes are fitting today. States sometimes she will liste  Previous appt: States A1c was 7.7 in Dec. Reports she wants to lose weight in 18 months to be about 10 lbs lighter or 1-2 sizes smaller for family event. States she feels better about herself when she is smaller.    Eating history: Length of time:  Previous treatments: none Goals for RD meetings: improve relationship with food   Mental health diagnosis: BED   Dietary assessment: A typical day consists of 3 meals and 0-1 snacks  Safe foods include: [place], lean beef, tofu, yogurt, chicken, venison, pineapple, popcorn, sugar-free cookies/ice cream Avoided foods include: none reported  24 hour recall:  B: 1 c cottage cheese + 1 c pineapple + 1 c oatmeal S: Dinner (12 pm): pork loin + 2 c salad + chicnek driessing Supper (5 pm): ricotta cheese, mozzarella cheese, spinach + roasted potatoes, cabbage S: club crackers , 1/2 cheese platter + 1/2 can diet pepsi  Beverages: unsweetened iced tea (2 glasses), 2 c coffee (with 2 sweet n low), 1 oz  juice and 1/2 can diet pepis  Physical activity: 15 min chair exercises about 3 days/week  What Methods Do You Use To Control Your Weight  (Compensatory behaviors)?  Binge  Estimated energy needs: 1600 kcal 180 g CHO 120 g pro 44 g fat  Nutrition Diagnosis: NB-1.5 Disordered eating pattern As related to binge eating disorder.  As evidenced by pt reported.  Intervention/Goals: Mainly listened. Discussed BS numbers being lower than previous visit. Encouraged pt to continue to listen to her body related to satiety. Pt was in agreement with goals listed.  Goals: - Continue to listen to your body for satiety.   Meal plan:    3 meals    1-2 snacks  Monitoring and Evaluation: Patient will follow up in 4 weeks.

## 2020-03-26 ENCOUNTER — Telehealth: Payer: Self-pay

## 2020-03-26 NOTE — Telephone Encounter (Signed)
The pt called back because she did not receive a call back from [facility]. I told her we was waiting on the Doctor response and then someone will call her back. The pt verbalized understanding and thanked me for the call.

## 2020-03-26 NOTE — Telephone Encounter (Addendum)
Received [employer] alert for AF burden, presenting Atrial-Flutter,v rate controlled, currently taking Eliquis, cardizem and plavix.Patient reports of feeling very well. States she walked down the hallway today and had no complaints; even absent of shortness of breath. Patient currently resides at [facility]. Requested for patient to send manual transmission when she is able to. Patient given instructions on how to. Verbalizes understanding.   Presenting on alert 03/26/20 @ 01:11 AM: AFL/VS 80's-100's    currently awaiting manual.

## 2020-03-26 NOTE — Telephone Encounter (Signed)
A-flutter was pace-terminated at 03/18/20 appointment with [name] per OV note. Appears patient maintained SR until 03/25/20 at 22:09. [employer] alert for AT/AF duration adjusted to >24hrs for now, but may need to turn off depending on plan per [name].

## 2020-04-03 ENCOUNTER — Telehealth: Payer: Self-pay

## 2020-04-03 NOTE — Telephone Encounter (Signed)
Received [employer] alert on 04/03/20 at 1:14 AM for persistent A-Flutter with controlled VR 70's. + Eliquis and Cardizem.  Patient was seen by [name] 03/18/20 and wanted to follow for recurrent episodes.   Called patient to assess, spoke to [place], Med Tech at [employer], patient reports she feels fine with no complaints. Patient has been compliant with all medications including no missed doses.  Informed Asher Muir that I will forawrd this message to [name] for any further recommendations. Advised that if they have any questions or concerns to feel free to call DC back. Direct number given.

## 2020-04-04 ENCOUNTER — Emergency Department (HOSPITAL_COMMUNITY): Payer: Medicare Other

## 2020-04-04 ENCOUNTER — Inpatient Hospital Stay (HOSPITAL_COMMUNITY)
Admission: EM | Admit: 2020-04-04 | Discharge: 2020-04-06 | DRG: 303 | Disposition: A | Discharge status: Home / Self Care | Payer: Medicare Other | Attending: Family Medicine | Admitting: Family Medicine

## 2020-04-04 DIAGNOSIS — Z7901 Long term (current) use of anticoagulants: Secondary | ICD-10-CM

## 2020-04-04 DIAGNOSIS — I25119 Atherosclerotic heart disease of native coronary artery with unspecified angina pectoris: Secondary | ICD-10-CM | POA: Diagnosis not present

## 2020-04-04 DIAGNOSIS — Z823 Family history of stroke: Secondary | ICD-10-CM

## 2020-04-04 DIAGNOSIS — M25559 Pain in unspecified hip: Secondary | ICD-10-CM | POA: Diagnosis present

## 2020-04-04 DIAGNOSIS — E11649 Type 2 diabetes mellitus with hypoglycemia without coma: Secondary | ICD-10-CM

## 2020-04-04 DIAGNOSIS — Z955 Presence of coronary angioplasty implant and graft: Secondary | ICD-10-CM

## 2020-04-04 DIAGNOSIS — I4892 Unspecified atrial flutter: Secondary | ICD-10-CM | POA: Diagnosis present

## 2020-04-04 DIAGNOSIS — Z79899 Other long term (current) drug therapy: Secondary | ICD-10-CM

## 2020-04-04 DIAGNOSIS — I495 Sick sinus syndrome: Secondary | ICD-10-CM | POA: Diagnosis present

## 2020-04-04 DIAGNOSIS — I10 Essential (primary) hypertension: Secondary | ICD-10-CM | POA: Diagnosis present

## 2020-04-04 DIAGNOSIS — Z8249 Family history of ischemic heart disease and other diseases of the circulatory system: Secondary | ICD-10-CM

## 2020-04-04 DIAGNOSIS — Z20822 Contact with and (suspected) exposure to covid-19: Secondary | ICD-10-CM | POA: Diagnosis present

## 2020-04-04 DIAGNOSIS — G4733 Obstructive sleep apnea (adult) (pediatric): Secondary | ICD-10-CM | POA: Diagnosis present

## 2020-04-04 DIAGNOSIS — E119 Type 2 diabetes mellitus without complications: Secondary | ICD-10-CM | POA: Diagnosis present

## 2020-04-04 DIAGNOSIS — I4510 Unspecified right bundle-branch block: Secondary | ICD-10-CM | POA: Diagnosis present

## 2020-04-04 DIAGNOSIS — Z950 Presence of cardiac pacemaker: Secondary | ICD-10-CM

## 2020-04-04 DIAGNOSIS — R079 Chest pain, unspecified: Secondary | ICD-10-CM | POA: Diagnosis not present

## 2020-04-04 DIAGNOSIS — Z87891 Personal history of nicotine dependence: Secondary | ICD-10-CM

## 2020-04-04 DIAGNOSIS — E785 Hyperlipidemia, unspecified: Secondary | ICD-10-CM | POA: Diagnosis present

## 2020-04-04 DIAGNOSIS — Z6841 Body Mass Index (BMI) 40.0 and over, adult: Secondary | ICD-10-CM

## 2020-04-04 DIAGNOSIS — I4819 Other persistent atrial fibrillation: Secondary | ICD-10-CM | POA: Diagnosis present

## 2020-04-04 DIAGNOSIS — Z7902 Long term (current) use of antithrombotics/antiplatelets: Secondary | ICD-10-CM

## 2020-04-04 DIAGNOSIS — I480 Paroxysmal atrial fibrillation: Secondary | ICD-10-CM | POA: Diagnosis present

## 2020-04-04 DIAGNOSIS — G8929 Other chronic pain: Secondary | ICD-10-CM | POA: Diagnosis present

## 2020-04-04 DIAGNOSIS — Z79890 Hormone replacement therapy (postmenopausal): Secondary | ICD-10-CM

## 2020-04-04 DIAGNOSIS — Z7984 Long term (current) use of oral hypoglycemic drugs: Secondary | ICD-10-CM

## 2020-04-04 LAB — CBC WITH DIFFERENTIAL/PLATELET
Abs Immature Granulocytes: 0.06 10*3/uL (ref 0.00–0.07)
Basophils Absolute: 0.1 10*3/uL (ref 0.0–0.1)
Basophils Relative: 1 %
Eosinophils Absolute: 0.2 10*3/uL (ref 0.0–0.5)
Eosinophils Relative: 2 %
HCT: 35.2 % — ABNORMAL LOW (ref 36.0–46.0)
Hemoglobin: 11.6 g/dL — ABNORMAL LOW (ref 12.0–15.0)
Immature Granulocytes: 1 %
Lymphocytes Relative: 21 %
Lymphs Abs: 2 10*3/uL (ref 0.7–4.0)
MCH: 31.4 pg (ref 26.0–34.0)
MCHC: 33 g/dL (ref 30.0–36.0)
MCV: 95.1 fL (ref 80.0–100.0)
Monocytes Absolute: 0.9 10*3/uL (ref 0.1–1.0)
Monocytes Relative: 9 %
Neutro Abs: 6.3 10*3/uL (ref 1.7–7.7)
Neutrophils Relative %: 66 %
Platelets: 371 10*3/uL (ref 150–400)
RBC: 3.7 MIL/uL — ABNORMAL LOW (ref 3.87–5.11)
RDW: 13.2 % (ref 11.5–15.5)
WBC: 9.5 10*3/uL (ref 4.0–10.5)
nRBC: 0 % (ref 0.0–0.2)

## 2020-04-04 LAB — BASIC METABOLIC PANEL
Anion gap: 15 (ref 5–15)
BUN: 23 mg/dL (ref 8–23)
CO2: 25 mmol/L (ref 22–32)
Calcium: 9.7 mg/dL (ref 8.9–10.3)
Chloride: 100 mmol/L (ref 98–111)
Creatinine, Ser: 1.05 mg/dL — ABNORMAL HIGH (ref 0.44–1.00)
GFR calc Af Amer: 56 mL/min — ABNORMAL LOW (ref >60)
GFR calc non Af Amer: 48 mL/min — ABNORMAL LOW (ref >60)
Glucose, Bld: 157 mg/dL — ABNORMAL HIGH (ref 70–99)
Potassium: 4.1 mmol/L (ref 3.5–5.1)
Sodium: 140 mmol/L (ref 135–145)

## 2020-04-04 LAB — TROPONIN I (HIGH SENSITIVITY): Troponin I (High Sensitivity): 22 ng/L — ABNORMAL HIGH (ref <18)

## 2020-04-04 NOTE — ED Provider Notes (Signed)
[facility] EMERGENCY DEPARTMENT Provider Note   CSN: [id number] Arrival date & time: 04/04/20  2004     History Chief Complaint  Patient presents with  . Chest Pain    Kirsten Jensen is a 86 y.o. female.  HPI She presents for evaluation of chest discomfort which started after eating supper tonight, and walking to her room from the cafeteria.  She lives in an assisted living facility.  Pain lasted 2 hours and then resolved spontaneously.  She has been taking her usual medications.  She has atrial fibrillation.  She got a call from the cardiology office about a possible incident, with her heart, 2 nights ago.  Does not recall any problems at that time.  She denies cough, shortness of breath, diaphoresis, nausea, vomiting, weakness or dizziness.  She is taking her usual medications.  There are no other known modifying factors.    Past Medical History:  Diagnosis Date  . Atrial fibrillation (HCC)   . Coronary artery disease    stenting x 2  . Diabetes (HCC)   . Hypertension   . Morbid obesity (HCC)   . Pacemaker     Patient Active Problem List   Diagnosis Date Noted  . Tachycardia-bradycardia syndrome (HCC) 03/17/2020  . Pacemaker 03/17/2020  . Atypical atrial flutter (HCC) 02/05/2020  . Paroxysmal atrial fibrillation (HCC) 11/06/2019  . Secondary hypercoagulable state (HCC) 11/06/2019    No past surgical history on file.   OB History   No obstetric history on file.     No family history on file.  Social History   Tobacco Use  . Smoking status: Never Smoker  . Smokeless tobacco: Never Used  Substance Use Topics  . Alcohol use: Not Currently  . Drug use: Never    Home Medications Prior to Admission medications   Medication Sig Start Date End Date Taking? Authorizing Provider  acetaminophen (TYLENOL) 500 MG tablet Take 500 mg by mouth every 6 (six) hours as needed for mild pain.    [provider]  acetaminophen-codeine (TYLENOL #4)  300-60 MG tablet Take 1 tablet by mouth every 4 (four) hours as needed for pain.    [provider]  apixaban (ELIQUIS) 5 MG TABS tablet Take 5 mg by mouth 2 (two) times daily.    [provider]  buPROPion (WELLBUTRIN) 100 MG tablet Take 100 mg by mouth daily.    [provider]  Cholecalciferol (VITAMIN D-3) 25 MCG (1000 UT) CAPS Take 1 capsule by mouth daily.    [provider]  clopidogrel (PLAVIX) 75 MG tablet Take 75 mg by mouth daily.    [provider]  diltiazem (CARDIZEM CD) 120 MG 24 hr capsule Take 1 capsule (120 mg total) by mouth daily. 02/05/20   Fenton, Clint R, PA  gemfibrozil (LOPID) 600 MG tablet Take 600 mg by mouth 2 (two) times daily before a meal.    [provider]  levothyroxine (SYNTHROID) 50 MCG tablet Take 50 mcg by mouth daily before breakfast.    [provider]  magnesium oxide (MAG-OX) 400 MG tablet Take 1 tablet (400 mg total) by mouth daily. 02/05/20   Fenton, Clint R, PA  metFORMIN (GLUCOPHAGE-XR) 500 MG 24 hr tablet Take 1,000 mg by mouth 2 (two) times daily.    [provider]  Multiple Vitamins-Minerals (OCUVITE ADULT 50+ PO) Take 1 capsule by mouth daily.    [provider]  pravastatin (PRAVACHOL) 80 MG tablet Take 80 mg by  mouth daily.    [provider]  ramipril (ALTACE) 5 MG capsule Take 5 mg by mouth daily.    [provider]  torsemide (DEMADEX) 20 MG tablet Take 1 tablet (20 mg total) by mouth daily. 10/18/19   Duke Salvia, MD  traMADol (ULTRAM) 50 MG tablet Take by mouth every 6 (six) hours as needed for moderate pain.    [provider]  vitamin B-12 (CYANOCOBALAMIN) 1000 MCG tablet Take 1,000 mcg by mouth daily.    [provider]    Allergies    Patient has no known allergies.  Review of Systems   Review of Systems  All other systems reviewed and are negative.   Physical Exam Updated Vital Signs LMP  (LMP Unknown)   SpO2  97%   Physical Exam Vitals and nursing note reviewed.  Constitutional:      General: She is not in acute distress.    Appearance: She is well-developed. She is not ill-appearing, toxic-appearing or diaphoretic.  HENT:     Head: Normocephalic and atraumatic.     Right Ear: External ear normal.     Left Ear: External ear normal.  Eyes:     Conjunctiva/sclera: Conjunctivae normal.     Pupils: Pupils are equal, round, and reactive to light.  Neck:     Trachea: Phonation normal.  Cardiovascular:     Rate and Rhythm: Normal rate and regular rhythm.     Heart sounds: Normal heart sounds.  Pulmonary:     Effort: Pulmonary effort is normal. No respiratory distress.     Breath sounds: Normal breath sounds. No stridor.  Chest:     Chest wall: No tenderness.  Abdominal:     General: There is no distension.     Palpations: Abdomen is soft.     Tenderness: There is no abdominal tenderness.  Musculoskeletal:        General: Normal range of motion.     Cervical back: Normal range of motion and neck supple.     Right lower leg: Edema present.     Left lower leg: Edema present.  Skin:    General: Skin is warm and dry.  Neurological:     Mental Status: She is alert and oriented to person, place, and time.     Cranial Nerves: No cranial nerve deficit.     Sensory: No sensory deficit.     Motor: No abnormal muscle tone.     Coordination: Coordination normal.  Psychiatric:        Mood and Affect: Mood normal.        Behavior: Behavior normal.        Thought Content: Thought content normal.        Judgment: Judgment normal.     ED Results / Procedures / Treatments   Labs (all labs ordered are listed, but only abnormal results are displayed) Labs Reviewed  BASIC METABOLIC PANEL - Abnormal; Notable for the following components:      Result Value   Glucose, Bld 157 (*)    Creatinine, Ser 1.05 (*)    GFR calc non Af Amer 48 (*)    GFR calc Af Amer 56 (*)    All other components within  normal limits  CBC WITH DIFFERENTIAL/PLATELET - Abnormal; Notable for the following components:   RBC 3.70 (*)    Hemoglobin 11.6 (*)    HCT 35.2 (*)    All other components within normal limits  TROPONIN I (HIGH  SENSITIVITY) - Abnormal; Notable for the following components:   Troponin I (High Sensitivity) 22 (*)    All other components within normal limits  TROPONIN I (HIGH SENSITIVITY)    EKG EKG Interpretation  Date/Time:  Friday April 04 2020 20:25:54 EDT Ventricular Rate:  86 PR Interval:    QRS Duration: 155 QT Interval:  419 QTC Calculation: 502 R Axis:   -25 Text Interpretation: Atrial fibrillation Right bundle branch block Left ventricular hypertrophy Lateral infarct, old Since last tracing rate slower and now in Atrial Fibrillation Confirmed by Daleen Bo [phone]) on 04/04/2020 10:02:37 PM   Radiology DG Chest Port 1 View  Result Date: 04/04/2020 CLINICAL DATA:  Chest pain EXAM: PORTABLE CHEST 1 VIEW COMPARISON:  10/13/2019 FINDINGS: Single frontal view of the chest demonstrates stable dual lead pacemaker. Cardiac silhouette is enlarged but stable. There is chronic central vascular congestion without airspace disease, effusion, or pneumothorax. No acute displaced fractures. IMPRESSION: 1. Stable exam, no acute process. Electronically Signed   By: Randa Ngo M.D.   On: 04/04/2020 22:16    Procedures Procedures (including critical care time)  Medications Ordered in ED Medications - No data to display  ED Course  I have reviewed the triage vital signs and the nursing notes.  Pertinent labs & imaging results that were available during my care of the patient were reviewed by me and considered in my medical decision making (see chart for details).  Clinical Course as of Apr 04 2338  Fri Apr 04, 2020  2316 Normal except glucose high, creatinine high, GFR low  Basic metabolic panel(!) [EW]  8250 Elevated, delta troponin pending  Troponin I (High Sensitivity)(!)  [EW]  2316 Normal except hemoglobin low  CBC with Differential(!) [EW]    Clinical Course User Index [EW] Daleen Bo, MD   MDM Rules/Calculators/A&P                       Patient Vitals for the past 24 hrs:  SpO2  04/04/20 2011 97 %      Medical Decision Making:  This patient is presenting for evaluation of transient chest pain which started at 5 PM tonight resolved about 7 PM. There were no associated symptoms, which does require a range of treatment options, and is a complaint that involves a moderate risk of morbidity and mortality. The differential diagnoses include ACS, PE, pneumonia, congestive heart failure. I decided  to review old records, and in summary patient with a primary cardiac disorder atrial fib/flutter, with pacemaker, monitor by cardiology and remote history of coronary disease status post stenting. I did not require additional historical information from anyone. Clinical Laboratory Tests Ordered, included CBC, chemistry panel, troponin. Radiologic Tests Ordered, included portable chest x-ray. I independently Visualized: Images images, which show normal cardiac silhouette, no CHF or infiltrate; Cardiac Monitor Tracing which shows atrial fibrillation, rate controlled  Critical Interventions-clinical evaluation, laboratory testing, chest x-ray.  After These Interventions, the Patient was reevaluated and was found to remain comfortable.  Initial troponin, slightly elevated at 22 above baseline of 18.  Patient being followed for ventricular flutter by her electrophysiologist.  She has not had any syncope.  She has a remote history of coronary disease, and has had stenting.  Tonight she had 2 hours of chest pain that resolved spontaneously.  There were no associated symptoms.  No recurrence of chest pain in the emergency department  CRITICAL CARE-no Performed by: Daleen Bo   Nursing Notes Reviewed/ Care Coordinated  Applicable Imaging Reviewed Interpretation  of Laboratory Data incorporated into ED treatment   Care to oncoming provider team to evaluate after return of second troponin and likely discharge if no change.  Would recommend follow-up with cardiology regarding episodes of atrial flutter.   Final Clinical Impression(s) / ED Diagnoses Final diagnoses:  Nonspecific chest pain    Rx / DC Orders ED Discharge Orders    None       Mancel Bale, MD 04/04/20 2340

## 2020-04-04 NOTE — ED Triage Notes (Signed)
Pt brought in by EMS from [facility]. Pt was laying in her chair when she developed CP 5/10. Pt was given 324 aspirin en route and CP is now resolved. Pt with hx of Afib w/ pacemaker placement.

## 2020-04-05 ENCOUNTER — Other Ambulatory Visit: Payer: Self-pay

## 2020-04-05 ENCOUNTER — Encounter (HOSPITAL_COMMUNITY): Payer: Self-pay | Admitting: Emergency Medicine

## 2020-04-05 DIAGNOSIS — Z8249 Family history of ischemic heart disease and other diseases of the circulatory system: Secondary | ICD-10-CM | POA: Diagnosis not present

## 2020-04-05 DIAGNOSIS — Z79890 Hormone replacement therapy (postmenopausal): Secondary | ICD-10-CM | POA: Diagnosis not present

## 2020-04-05 DIAGNOSIS — I25119 Atherosclerotic heart disease of native coronary artery with unspecified angina pectoris: Secondary | ICD-10-CM | POA: Diagnosis present

## 2020-04-05 DIAGNOSIS — I4510 Unspecified right bundle-branch block: Secondary | ICD-10-CM | POA: Diagnosis present

## 2020-04-05 DIAGNOSIS — I495 Sick sinus syndrome: Secondary | ICD-10-CM | POA: Diagnosis present

## 2020-04-05 DIAGNOSIS — Z823 Family history of stroke: Secondary | ICD-10-CM | POA: Diagnosis not present

## 2020-04-05 DIAGNOSIS — Z7901 Long term (current) use of anticoagulants: Secondary | ICD-10-CM | POA: Diagnosis not present

## 2020-04-05 DIAGNOSIS — E119 Type 2 diabetes mellitus without complications: Secondary | ICD-10-CM

## 2020-04-05 DIAGNOSIS — E11649 Type 2 diabetes mellitus with hypoglycemia without coma: Secondary | ICD-10-CM

## 2020-04-05 DIAGNOSIS — Z7902 Long term (current) use of antithrombotics/antiplatelets: Secondary | ICD-10-CM | POA: Diagnosis not present

## 2020-04-05 DIAGNOSIS — E785 Hyperlipidemia, unspecified: Secondary | ICD-10-CM

## 2020-04-05 DIAGNOSIS — Z79899 Other long term (current) drug therapy: Secondary | ICD-10-CM | POA: Diagnosis not present

## 2020-04-05 DIAGNOSIS — Z7984 Long term (current) use of oral hypoglycemic drugs: Secondary | ICD-10-CM | POA: Diagnosis not present

## 2020-04-05 DIAGNOSIS — R079 Chest pain, unspecified: Secondary | ICD-10-CM | POA: Diagnosis present

## 2020-04-05 DIAGNOSIS — Z87891 Personal history of nicotine dependence: Secondary | ICD-10-CM | POA: Diagnosis not present

## 2020-04-05 DIAGNOSIS — Z950 Presence of cardiac pacemaker: Secondary | ICD-10-CM | POA: Diagnosis not present

## 2020-04-05 DIAGNOSIS — Z6841 Body Mass Index (BMI) 40.0 and over, adult: Secondary | ICD-10-CM

## 2020-04-05 DIAGNOSIS — M25559 Pain in unspecified hip: Secondary | ICD-10-CM | POA: Diagnosis present

## 2020-04-05 DIAGNOSIS — I10 Essential (primary) hypertension: Secondary | ICD-10-CM | POA: Diagnosis present

## 2020-04-05 DIAGNOSIS — Z20822 Contact with and (suspected) exposure to covid-19: Secondary | ICD-10-CM | POA: Diagnosis present

## 2020-04-05 DIAGNOSIS — I4892 Unspecified atrial flutter: Secondary | ICD-10-CM | POA: Diagnosis present

## 2020-04-05 DIAGNOSIS — G8929 Other chronic pain: Secondary | ICD-10-CM | POA: Diagnosis present

## 2020-04-05 DIAGNOSIS — I4819 Other persistent atrial fibrillation: Secondary | ICD-10-CM | POA: Diagnosis present

## 2020-04-05 DIAGNOSIS — G4733 Obstructive sleep apnea (adult) (pediatric): Secondary | ICD-10-CM | POA: Diagnosis present

## 2020-04-05 DIAGNOSIS — Z955 Presence of coronary angioplasty implant and graft: Secondary | ICD-10-CM | POA: Diagnosis not present

## 2020-04-05 HISTORY — DX: Type 2 diabetes mellitus without complications: E11.9

## 2020-04-05 HISTORY — DX: Hyperlipidemia, unspecified: E78.5

## 2020-04-05 LAB — SARS CORONAVIRUS 2 (TAT 6-24 HRS): SARS Coronavirus 2: NEGATIVE

## 2020-04-05 LAB — HEMOGLOBIN A1C
Hgb A1c MFr Bld: 8 % — ABNORMAL HIGH (ref 4.8–5.6)
Mean Plasma Glucose: 182.9 mg/dL

## 2020-04-05 LAB — GLUCOSE, CAPILLARY
Glucose-Capillary: 220 mg/dL — ABNORMAL HIGH (ref 70–99)
Glucose-Capillary: 190 mg/dL — ABNORMAL HIGH (ref 70–99)

## 2020-04-05 LAB — APTT: aPTT: 48 seconds — ABNORMAL HIGH (ref 24–36)

## 2020-04-05 LAB — MRSA PCR SCREENING: MRSA by PCR: NEGATIVE

## 2020-04-05 LAB — PROTIME-INR
INR: 2.1 — ABNORMAL HIGH (ref 0.8–1.2)
Prothrombin Time: 23.2 seconds — ABNORMAL HIGH (ref 11.4–15.2)

## 2020-04-05 LAB — CBG MONITORING, ED
Glucose-Capillary: 138 mg/dL — ABNORMAL HIGH (ref 70–99)
Glucose-Capillary: 141 mg/dL — ABNORMAL HIGH (ref 70–99)

## 2020-04-05 LAB — MAGNESIUM: Magnesium: 1.3 mg/dL — ABNORMAL LOW (ref 1.7–2.4)

## 2020-04-05 LAB — TROPONIN I (HIGH SENSITIVITY)
Troponin I (High Sensitivity): 24 ng/L — ABNORMAL HIGH (ref <18)
Troponin I (High Sensitivity): 22 ng/L — ABNORMAL HIGH (ref <18)

## 2020-04-05 MED ORDER — METFORMIN HCL ER 500 MG PO TB24
1000.0000 mg | ORAL_TABLET | Freq: Two times a day (BID) | ORAL | Status: DC
  Administered 2020-04-05 – 2020-04-06 (×3): 1000 mg via ORAL
  Filled 2020-04-05 (×4): qty 2

## 2020-04-05 MED ORDER — APIXABAN 5 MG PO TABS
5.0000 mg | ORAL_TABLET | Freq: Two times a day (BID) | ORAL | Status: DC
  Administered 2020-04-05 – 2020-04-06 (×3): 5 mg via ORAL
  Filled 2020-04-05 (×4): qty 1

## 2020-04-05 MED ORDER — GEMFIBROZIL 600 MG PO TABS
600.0000 mg | ORAL_TABLET | Freq: Two times a day (BID) | ORAL | Status: DC
  Administered 2020-04-05 – 2020-04-06 (×3): 600 mg via ORAL
  Filled 2020-04-05 (×4): qty 1

## 2020-04-05 MED ORDER — PRAVASTATIN SODIUM 40 MG PO TABS
80.0000 mg | ORAL_TABLET | Freq: Every day | ORAL | Status: DC
  Administered 2020-04-05: 80 mg via ORAL
  Filled 2020-04-05 (×2): qty 2

## 2020-04-05 MED ORDER — TRAMADOL HCL 50 MG PO TABS
50.0000 mg | ORAL_TABLET | Freq: Three times a day (TID) | ORAL | Status: DC | PRN
  Administered 2020-04-05: 50 mg via ORAL
  Filled 2020-04-05: qty 1

## 2020-04-05 MED ORDER — BUPROPION HCL 100 MG PO TABS
100.0000 mg | ORAL_TABLET | Freq: Every day | ORAL | Status: DC
  Administered 2020-04-05 – 2020-04-06 (×2): 100 mg via ORAL
  Filled 2020-04-05 (×3): qty 1

## 2020-04-05 MED ORDER — VITAMIN D 25 MCG (1000 UNIT) PO TABS
1000.0000 [IU] | ORAL_TABLET | Freq: Every day | ORAL | Status: DC
  Administered 2020-04-05 – 2020-04-06 (×2): 1000 [IU] via ORAL
  Filled 2020-04-05 (×2): qty 1

## 2020-04-05 MED ORDER — MAGNESIUM OXIDE 400 (241.3 MG) MG PO TABS
400.0000 mg | ORAL_TABLET | Freq: Every day | ORAL | Status: DC
  Administered 2020-04-05 – 2020-04-06 (×2): 400 mg via ORAL
  Filled 2020-04-05 (×2): qty 1

## 2020-04-05 MED ORDER — INSULIN ASPART 100 UNIT/ML ~~LOC~~ SOLN
0.0000 [IU] | Freq: Three times a day (TID) | SUBCUTANEOUS | Status: DC
  Administered 2020-04-06: 3 [IU] via SUBCUTANEOUS

## 2020-04-05 MED ORDER — ACETAMINOPHEN 500 MG PO TABS
500.0000 mg | ORAL_TABLET | Freq: Two times a day (BID) | ORAL | Status: DC | PRN
  Administered 2020-04-05: 500 mg via ORAL
  Filled 2020-04-05: qty 1

## 2020-04-05 MED ORDER — TORSEMIDE 20 MG PO TABS
20.0000 mg | ORAL_TABLET | Freq: Every day | ORAL | Status: DC
  Administered 2020-04-05 – 2020-04-06 (×2): 20 mg via ORAL
  Filled 2020-04-05 (×2): qty 1

## 2020-04-05 MED ORDER — DILTIAZEM HCL ER COATED BEADS 120 MG PO CP24
120.0000 mg | ORAL_CAPSULE | Freq: Every day | ORAL | Status: DC
  Administered 2020-04-05 – 2020-04-06 (×2): 120 mg via ORAL
  Filled 2020-04-05 (×3): qty 1

## 2020-04-05 MED ORDER — CLOPIDOGREL BISULFATE 75 MG PO TABS
75.0000 mg | ORAL_TABLET | Freq: Every day | ORAL | Status: DC
  Administered 2020-04-05 – 2020-04-06 (×2): 75 mg via ORAL
  Filled 2020-04-05 (×2): qty 1

## 2020-04-05 MED ORDER — VITAMIN B-12 1000 MCG PO TABS
1000.0000 ug | ORAL_TABLET | Freq: Every day | ORAL | Status: DC
  Administered 2020-04-05 – 2020-04-06 (×2): 1000 ug via ORAL
  Filled 2020-04-05 (×2): qty 1

## 2020-04-05 MED ORDER — LEVOTHYROXINE SODIUM 50 MCG PO TABS
50.0000 ug | ORAL_TABLET | Freq: Every day | ORAL | Status: DC
  Administered 2020-04-05 – 2020-04-06 (×2): 50 ug via ORAL
  Filled 2020-04-05 (×2): qty 1

## 2020-04-05 MED ORDER — RAMIPRIL 2.5 MG PO CAPS
5.0000 mg | ORAL_CAPSULE | Freq: Every day | ORAL | Status: DC
  Administered 2020-04-05 – 2020-04-06 (×2): 5 mg via ORAL
  Filled 2020-04-05: qty 2
  Filled 2020-04-05: qty 1

## 2020-04-05 NOTE — ED Notes (Signed)
PAGED DR FAIR TO DR Karel Jarvis

## 2020-04-05 NOTE — ED Notes (Signed)
Pt. Assisted with ambulation to the bathroom.

## 2020-04-05 NOTE — ED Notes (Signed)
Pt. Assisted w/ ambulation to RR.  

## 2020-04-05 NOTE — ED Notes (Signed)
Per [name] call Kirsten Skiff PA with cardiology and find out if any procedures will be done today before ordering diet.

## 2020-04-05 NOTE — ED Notes (Signed)
Paged [name], pt refused 8am insulin per [employer] RN and pt refused 12noon insulin.  Pt states "I am not on insulin at home and I don't want to take any here".   Also, states she takes Tramadol "up to 3 pills every 8 hours at home".   [name] returned paged, information relayed.  Per [name] pt may have a diabetic heart healthy diet.  Have pharmacy tech reconcile medications.

## 2020-04-05 NOTE — Telephone Encounter (Signed)
We had done paceterm9ination of her atrial arrhythmia in the office-- could you please see if she had noted any improvement in her symptoms between then and her recurrence  if not, continue dilt which has helped with rate control and we can leave things ow as they are Thanks SK

## 2020-04-05 NOTE — ED Notes (Signed)
Marjie Skiff PA in ED.  No procedures to be done today.  Ordered diet.

## 2020-04-05 NOTE — Progress Notes (Signed)
Pt refused SSI coverage.MDpaged made aware.

## 2020-04-05 NOTE — ED Notes (Signed)
Went to give pt. Her Insulin, and she stated, Ive never taken Insulin, I take the METformin.  Did not give the Insulin.

## 2020-04-05 NOTE — ED Notes (Signed)
Pt. Requesting Tramadol for hip pain.

## 2020-04-05 NOTE — Consult Note (Addendum)
Cardiology Consultation:   Patient ID: Kirsten Jensen MRN: [id number]; DOB: [birth year]  Admit date: 04/04/2020 Date of Consult: 04/05/2020  Primary Care Provider: Melida Quitter, MD Primary Cardiologist/ Electrophysiologist: Sherryl Manges, MD  Patient Profile:   Kirsten Jensen is a 86 y.o. female with a history of CAD s/p prior stenting, persistent atrial fibrillation/flutter on Eliquis, tachybrady syndrome s/p PPM, hypertension, diabetes mellitus, and obstructive sleep apnea who is being seen today for the evaluation of atrial flutter and chest pain at the request of Dr. Mahala Menghini.  History of Present Illness:   Kirsten Jensen is a 86 year old female with the above history who is followed by [name]. History of CAD with prior stenting x2 in [place]. Patient estimates most recent stent was placed about 5 years ago. She has been maintained on Plavix since that time. She had a PPM placed about 1 year for tachybrady syndrome. She moved here 10 months ago from [place] and has been following with [name] and the [facility]. Patient recently seen by [name] on 03/18/2020. Per that office visit note, "Undertook rapid atrial pacing with termination of her atrial flutter. We will follow for recurrence and then make a decision regarding antiarrhythmic therapy and/or catheter ablation of the AV node."  Patient presented to the ED on 04/04/2020 via EMS from assisted living facility for further evaluation of chest pain. Patient reports on set on chest tenderness about 2 hours after eating last night that gradually got a little worse so patient decided to come to the ED to be evaluated. She states pain did not feel like reflux. Pain is located in one spot on left side of sternum and patient notes some chest wall tenderness yesterday. She states it feels like arthritis of her breast bone. No exertional chest pain although patient is not very active. She denies any associated shortness of breath, nausea, vomiting, or  diaphoresis with the pain. She occasional notes some mild shortness of breath with activity but attributes this to her weight. She is generally unaware of her atrial fibrillation and denies any recent palpitations, lightheadedness, dizziness. No syncope. No abnormal bleeding or bruising on Plavix and Eliquis. No recent fevers or illnesses.   In the ED, patient mildly tachycardic and tachypneic at times. EKG showed rate controlled atrial flutter with LVH and RBBB. High-sensitivity troponin minimally elevated and flat at 22 >> 24 >> 22. Chest x-ray showed no acute findings. WBC 9.5, Hgb 11.6, Plts 371. Na 140, K 4.1, Glucose 157, Creatinine 1.05. COVID-19 negative. Cardiology consulted for further evaluation.   At the time of this evaluation, patient is laying completely flat in bed in no acute distress. She denies any chest pain at this time. Her only complaint is her chronic hip pain.   She reports remote smoking history but has not smoked since she was in her 74's. She does have a family history of CV disease with her father having a heart attack at the age of 78 and her mother dying from a stroke in her 76's.   Past Medical History:  Diagnosis Date  . Atrial fibrillation (HCC)   . Coronary artery disease    stenting x 2  . Diabetes (HCC)   . Hypertension   . Morbid obesity (HCC)   . Pacemaker     History reviewed. No pertinent surgical history.   Home Medications:  Prior to Admission medications   Medication Sig Start Date End Date Taking? Authorizing Provider  acetaminophen (TYLENOL) 500 MG  tablet Take 500 mg by mouth 2 (two) times daily as needed for mild pain.    Yes [provider]  acetaminophen-codeine (TYLENOL #4) 300-60 MG tablet Take 1 tablet by mouth 3 (three) times daily as needed for pain.    Yes [provider]  apixaban (ELIQUIS) 5 MG TABS tablet Take 5 mg by mouth 2 (two) times daily.   Yes [provider]  buPROPion (WELLBUTRIN) 100 MG tablet  Take 100 mg by mouth daily.   Yes [provider]  Cholecalciferol (VITAMIN D-3) 25 MCG (1000 UT) CAPS Take 1 capsule by mouth daily.   Yes [provider]  clopidogrel (PLAVIX) 75 MG tablet Take 75 mg by mouth daily.   Yes [provider]  diltiazem (CARDIZEM CD) 120 MG 24 hr capsule Take 1 capsule (120 mg total) by mouth daily. 02/05/20  Yes Fenton, Clint R, PA  gemfibrozil (LOPID) 600 MG tablet Take 600 mg by mouth 2 (two) times daily before a meal.   Yes [provider]  glimepiride (AMARYL) 2 MG tablet Take 2 mg by mouth daily with breakfast.   Yes [provider]  ketoconazole (NIZORAL) 2 % shampoo Apply 1 application topically daily as needed for irritation. Let sit for 3 minutes before rinsing.   Yes [provider]  levothyroxine (SYNTHROID) 50 MCG tablet Take 50 mcg by mouth daily before breakfast.   Yes [provider]  magnesium oxide (MAG-OX) 400 MG tablet Take 1 tablet (400 mg total) by mouth daily. 02/05/20  Yes Fenton, Clint R, PA  metFORMIN (GLUCOPHAGE-XR) 500 MG 24 hr tablet Take 1,000 mg by mouth 2 (two) times daily.   Yes [provider]  Multiple Vitamins-Minerals (OCUVITE ADULT 50+ PO) Take 1 capsule by mouth daily.   Yes [provider]  pravastatin (PRAVACHOL) 80 MG tablet Take 80 mg by mouth daily.   Yes [provider]  ramipril (ALTACE) 5 MG capsule Take 5 mg by mouth daily.   Yes [provider]  torsemide (DEMADEX) 20 MG tablet Take 1 tablet (20 mg total) by mouth daily. 10/18/19  Yes Duke Salvia, MD  traMADol (ULTRAM) 50 MG tablet Take 50 mg by mouth every 8 (eight) hours as needed for moderate pain.    Yes [provider]  vitamin B-12 (CYANOCOBALAMIN) 1000 MCG tablet Take 1,000 mcg by mouth daily.   Yes [provider]    Inpatient Medications: Scheduled Meds: . apixaban  5 mg Oral BID  . buPROPion  100 mg Oral Q breakfast  . cholecalciferol   1,000 Units Oral Daily  . clopidogrel  75 mg Oral Daily  . diltiazem  120 mg Oral Daily  . gemfibrozil  600 mg Oral BID AC  . insulin aspart  0-9 Units Subcutaneous TID WC  . levothyroxine  50 mcg Oral QAC breakfast  . magnesium oxide  400 mg Oral Daily  . metFORMIN  1,000 mg Oral BID WC  . pravastatin  80 mg Oral Daily  . ramipril  5 mg Oral Daily  . torsemide  20 mg Oral Daily  . vitamin B-12  1,000 mcg Oral Daily   Continuous Infusions:  PRN Meds: acetaminophen, traMADol  Allergies:   No Known Allergies  Social History:   Social History   Socioeconomic History  . Marital status: Widowed    Spouse name: Not on file  . Number of children: Not on file  . Years of education: Not on file  .  Highest education level: Not on file  Occupational History  . Not on file  Tobacco Use  . Smoking status: Never Smoker  . Smokeless tobacco: Never Used  Substance and Sexual Activity  . Alcohol use: Not Currently  . Drug use: Never  . Sexual activity: Not on file  Other Topics Concern  . Not on file  Social History Narrative  . Not on file   Social Determinants of Health   Financial Resource Strain:   . Difficulty of Paying Living Expenses:   Food Insecurity:   . Worried About Programme researcher, broadcasting/film/video in the Last Year:   . Barista in the Last Year:   Transportation Needs:   . Freight forwarder (Medical):   Marland Kitchen Lack of Transportation (Non-Medical):   Physical Activity:   . Days of Exercise per Week:   . Minutes of Exercise per Session:   Stress:   . Feeling of Stress :   Social Connections:   . Frequency of Communication with Friends and Family:   . Frequency of Social Gatherings with Friends and Family:   . Attends Religious Services:   . Active Member of Clubs or Organizations:   . Attends Banker Meetings:   Marland Kitchen Marital Status:   Intimate Partner Violence:   . Fear of Current or Ex-Partner:   . Emotionally Abused:   Marland Kitchen Physically Abused:   .  Sexually Abused:     Family History:    Family History  Problem Relation Age of Onset  . Hypertension Mother   . Stroke Mother        died at age 23  . CAD Father        MI at age of 71     ROS:  Please see the history of present illness.  All other ROS reviewed and negative.     Physical Exam/Data:   Vitals:   04/04/20 2011 04/05/20 0115 04/05/20 1300 04/05/20 1318  BP:  116/71 (!) 111/51   Pulse:  89 86   Resp:  (!) 22 (!) 28   SpO2: 97% 95% 99%   Weight:    (!) 138 kg  Height:    5\' 7"  (1.702 m)   No intake or output data in the 24 hours ending 04/05/20 1402 Last 3 Weights 04/05/2020 03/18/2020 02/19/2020  Weight (lbs) 304 lb 3.8 oz 305 lb 302 lb 6.4 oz  Weight (kg) 138 kg 138.347 kg 137.168 kg     Body mass index is 47.65 kg/m.  General: 86 y.o. female resting comfortably in no acute distress. HEENT: Normocephalic and atraumatic. Sclera clear. Neck: Supple. No JVD. Heart: Irregular rhythm. Distinct S1 and S2. No significant murmurs, gallops, or rubs. Radial pulses 2+ and equal bilaterally. Lungs: No increased work of breathing. Clear to ausculation bilaterally. No wheezes, rhonchi, or rales.  Abdomen: Soft, non-distended, and non-tender to palpation. Bowel sounds present. Extremities: Trace to mild lower extremity edema.    Skin: Warm and dry. Neuro: Alert and oriented x3. No focal deficits. Psych: Normal affect. Responds appropriately.   EKG:  The EKG was personally reviewed and demonstrates:  Atrial flutter, rate 86 bpm, with LVH and RBBB but no acute ST/T changes.  Telemetry:  Telemetry was personally reviewed and demonstrates: Atrial flutter/fibrillation, mostly rate controlled, with PVCs.   Relevant CV Studies: N/A.  Laboratory Data:  High Sensitivity Troponin:   Recent Labs  Lab 04/04/20 2204 04/05/20 0141 04/05/20 0450  TROPONINIHS 22* 24* 22*  Chemistry Recent Labs  Lab 04/04/20 2204  NA 140  K 4.1  CL 100  CO2 25  GLUCOSE 157*  BUN 23   CREATININE 1.05*  CALCIUM 9.7  GFRNONAA 48*  GFRAA 56*  ANIONGAP 15    No results for input(s): PROT, ALBUMIN, AST, ALT, ALKPHOS, BILITOT in the last 168 hours. Hematology Recent Labs  Lab 04/04/20 2204  WBC 9.5  RBC 3.70*  HGB 11.6*  HCT 35.2*  MCV 95.1  MCH 31.4  MCHC 33.0  RDW 13.2  PLT 371   BNPNo results for input(s): BNP, PROBNP in the last 168 hours.  DDimer No results for input(s): DDIMER in the last 168 hours.   Radiology/Studies:  DG Chest Port 1 View  Result Date: 04/04/2020 CLINICAL DATA:  Chest pain EXAM: PORTABLE CHEST 1 VIEW COMPARISON:  10/13/2019 FINDINGS: Single frontal view of the chest demonstrates stable dual lead pacemaker. Cardiac silhouette is enlarged but stable. There is chronic central vascular congestion without airspace disease, effusion, or pneumothorax. No acute displaced fractures. IMPRESSION: 1. Stable exam, no acute process. Electronically Signed   By: Randa Ngo M.D.   On: 04/04/2020 22:16  [id number]} TIMI Risk Score for Unstable Angina or Non-ST Elevation MI:   The patient's TIMI risk score is 4, which indicates a 20% risk of all cause mortality, new or recurrent myocardial infarction or need for urgent revascularization in the next 14 days.   Assessment and Plan:   Chest Pain with Known CAD - Patient present with chest pain at rest. Patient does have a history CAD with 2 prior stents placed in [place]. Patient thinks last stent was placed about 5 years ago.  - EKG shows no acute ST/T changes.  - High-sensitivity troponin minimally elevated and flat at 22 >> 24 >> 22. Not consistent with ACS. - Currently chest pain free.  - Will check Echo to assess EF and wall motion. - Presentation very atypical. Possible GI related given onset after meal. She also notes chest wall tenderness to left of breastbone so possible costochondritis. Patient also admits she has been under a lot of stress lately so this may be playing a role. However, given multiple  CV risk factors, will discuss with MD about possible stress test.  - Continue Plavix and statin.  Persistent Atrial Fibrillation/ Atrial Flutter/ Tachybrady Syndrome s/p PPM - Patient has a dual chamber [employer] PPM implanted in 2019. Last device check on 03/11/20 showed persistent atrial fibrillation since 12/15/2019 mostly rate controlled.  - Presented in what looks like possible atrial flutter vs. atrial fibrillation. Rate controlled. - Continue Cardizem 120mg  daily. - Continue chronic anticoagulation with Eliquis 5mg  twice daily.  - Device reportedly interrogated in the ED but I could not find the report.  Hypertension - BP well controlled. - Continue home medications: Ramipril 5mg  daily and Cardizem 120mg  daily.   Diabetes Mellitus - Hemoglobin A1c 8.0 this admission. - Currently on sliding scale insulin. - Management per primary team  Obstructive Sleep Apnea - Compliant with CPAP.  Morbid Obesity - BMI 47.    For questions or updates, please contact [place] Please consult [email or web address] for contact info under     Signed, Darreld Mclean, PA-C  04/05/2020 2:02 PM   Patient seen and examined with Sande Rives PA-C.  Agree as above, with the following exceptions and changes as noted below. She is currently feeling well and no chest pain. She does not feel that this is from her heart but can't  be sure. . Gen: NAD, CV: iRRR, no murmurs, Lungs: clear, Abd: soft, Extrem: Warm, well perfused, trace edema, Neuro/Psych: alert and oriented x 3, normal mood and affect. All available labs, radiology testing, previous records reviewed.   Chest pain sounds atypical for cardiac source. Will obtain echocardiogram to evaluate LV Function. If abnormal will discuss stress testing, though likely this can be deferred to outpatient setting. Atrial flutter on ECG, stable rates.  Parke PoissonGayatri A Jaquaya Coyle, MD 04/05/20 5:56 PM

## 2020-04-05 NOTE — ED Notes (Signed)
Pt. Assisted w/ambulation from bathroom to bed

## 2020-04-05 NOTE — ED Notes (Signed)
Pt. Assisted w/ ambulation back to bed.

## 2020-04-05 NOTE — ED Notes (Signed)
Paged J Samtani for diet orders or if pt still needs to be NPO.

## 2020-04-05 NOTE — Progress Notes (Signed)
Agree with the plan of care as documented per my partner Dr. Leeroy Bock fair on HPI done this morning at 78:53 AM  86 year old [name] Resident since 05/2019 lady cardiac disease (CAD status post 2 stents heart score 4) multiple other comorbid's tachybradycardia + [employer] pacemaker presented to ED with chest pain, osa on cpap [occasional] Troponin trending 22-->24-->cardiology recommended to be consulted  EKG my over read from 04/05/2020 at 130 shows atrial flutter with PVCs QRS axis -15 LAFB RSR prime V1 through V3 and no ST-T wave depressions although cannot really interpret EKG given presence of PPM  Tell me that the pain in the chest was more of an "ache" --started around 1820 and went on for a couple of hours--they chekd on her and sent her here.it was constant up until sometime lastg evening when it just went away--out of 10 the pain was 7/10--releived by "nothing"---asa in the ambuilance didn't seem to make a difference --the pain just went away on its own No sour nor reflux feeling in the chest---she was told she might have had runs of flutter this am She DID NOT have pain when she had here HA "had a "squiggly feeling" from wasist  ip Mnitors confirm runs of a fib with some PVC  On exam BP 116/71   Pulse 89   Resp (!) 22   LMP  (LMP Unknown)   SpO2 95%  Obese mallampatti 4 No jvd no bruit s1 s2 no m/r/g abd obese poor exam neuro intact no focal deficit  Plan We will consult cardiology-DDx ACS vs flutter---she recently had HR and device adjusted by DR Graciela Husbands to ? Overdrive pace May need stress test per csrds Expect will be observed overnight at least

## 2020-04-05 NOTE — ED Notes (Signed)
Pt. Assisted w/ambulation the bathroom

## 2020-04-05 NOTE — H&P (Addendum)
[facility] History and Physical  Kirsten Jensen ZOX:[id number] DOB: 10-20-1932 DOA: 04/04/2020  Referring EDP: Nicanor Alcon PCP: Melida Quitter, MD   Chief Complaint: Chest Pain  HPI: Kirsten Jensen is a 86 y.o. female with PMH of PAF on Eliquis with pacemaker, T2DM, HTN, HLD, CAD s/p PCI who presented to ED with chest pain and admitted for ACS rule-out.  Patient reports feeling normal yesterday. She ate dinner around 1700 and noticed chest pain around 1800 that lasted for about two hours and resolved in the ED. Patient was "relaxing" when pain started. Pain was not worse with movement. Denies pain like this previously. Pain was located sub-sternally and in "breast bone" per patient. Reports that it was not extremely painful but very noticeable. Pain did not radiate and not worse with respirations. She did not take anything for the pain. Denies reflux symptoms. Denies other symptoms and reports feeling well currently. Denies headache, dizziness, fever, chills, cough, SOB, abdominal pain, nausea, vomiting, diarrhea, constipation, dysuria, hematuria, hematochezia, melena, difficulty moving arms/legs, speech difficulty, trouble eating, confusion or any other complaints.  In the ED: Vitals stable. Labs remarkable for Cr 1.05, Glucose 157, Trop 22. CBC WNL. CXR: Stable exam, no acute process. EDP called for admission for ACS rule-out due to patient's risk factors.  Review of Systems:  All other systems negative unless noted above in HPI.   Past Medical History:  Diagnosis Date  . Atrial fibrillation (HCC)   . Coronary artery disease    stenting x 2  . Diabetes (HCC)   . Hypertension   . Morbid obesity (HCC)   . Pacemaker    History reviewed. No pertinent surgical history. Social History:  reports that she has never smoked. She has never used smokeless tobacco. She reports previous alcohol use. She reports that she does not use drugs.  No Known Allergies  History reviewed. No pertinent  family history.  Father-Heart Attack in 70's Mother-HTN, Stroke   Prior to Admission medications   Medication Sig Start Date End Date Taking? Authorizing Provider  acetaminophen (TYLENOL) 500 MG tablet Take 500 mg by mouth 2 (two) times daily as needed for mild pain.    Yes [provider]  acetaminophen-codeine (TYLENOL #4) 300-60 MG tablet Take 1 tablet by mouth 3 (three) times daily as needed for pain.    Yes [provider]  apixaban (ELIQUIS) 5 MG TABS tablet Take 5 mg by mouth 2 (two) times daily.   Yes [provider]  buPROPion (WELLBUTRIN) 100 MG tablet Take 100 mg by mouth daily.   Yes [provider]  Cholecalciferol (VITAMIN D-3) 25 MCG (1000 UT) CAPS Take 1 capsule by mouth daily.   Yes [provider]  clopidogrel (PLAVIX) 75 MG tablet Take 75 mg by mouth daily.   Yes [provider]  diltiazem (CARDIZEM CD) 120 MG 24 hr capsule Take 1 capsule (120 mg total) by mouth daily. 02/05/20  Yes Fenton, Clint R, PA  gemfibrozil (LOPID) 600 MG tablet Take 600 mg by mouth 2 (two) times daily before a meal.   Yes [provider]  glimepiride (AMARYL) 2 MG tablet Take 2 mg by mouth daily with breakfast.   Yes [provider]  ketoconazole (NIZORAL) 2 % shampoo Apply 1 application topically daily as needed for irritation. Let sit for 3 minutes before rinsing.   Yes [provider]  levothyroxine (SYNTHROID) 50 MCG tablet Take 50 mcg by mouth daily before breakfast.   Yes [provider]  magnesium oxide (MAG-OX) 400 MG tablet Take 1 tablet (400 mg total) by mouth daily. 02/05/20  Yes Fenton, Clint R, PA  metFORMIN (GLUCOPHAGE-XR) 500 MG 24 hr tablet Take 1,000 mg by mouth 2 (two) times daily.   Yes [provider]  Multiple Vitamins-Minerals (OCUVITE ADULT 50+ PO) Take 1 capsule by mouth daily.   Yes [provider]  pravastatin (PRAVACHOL) 80 MG tablet Take 80 mg by mouth daily.   Yes  [provider]  ramipril (ALTACE) 5 MG capsule Take 5 mg by mouth daily.   Yes [provider]  torsemide (DEMADEX) 20 MG tablet Take 1 tablet (20 mg total) by mouth daily. 10/18/19  Yes Duke Salvia, MD  traMADol (ULTRAM) 50 MG tablet Take 50 mg by mouth every 8 (eight) hours as needed for moderate pain.    Yes [provider]  vitamin B-12 (CYANOCOBALAMIN) 1000 MCG tablet Take 1,000 mcg by mouth daily.   Yes [provider]   Physical Exam: Vitals:   04/04/20 2011 04/05/20 0115  BP:  116/71  Pulse:  89  Resp:  (!) 22  SpO2: 97% 95%    Wt Readings from Last 3 Encounters:  03/18/20 (!) 138.3 kg  02/19/20 (!) 137.2 kg  02/05/20 134.4 kg    . General:  Appears calm and comfortable. AAOx4. Obese. . Eyes: EOMI, normal lids, irises & conjunctiva . ENT: grossly normal hearing, lips & tongue . Neck: normal ROM . Cardiovascular: RRR, no m/r/g. No LE edema. . Chest Wall: Tenderness of lower sternum with deep palpation only. Marland Kitchen Respiratory: CTA bilaterally, no w/r/r. Normal respiratory effort. . Abdomen: soft, ntnd . Skin: no rash or induration seen on limited exam . Musculoskeletal: grossly normal tone BUE/BLE . Psychiatric: grossly normal mood and affect, speech fluent and appropriate . Neurologic: grossly non-focal.          Labs on Admission:  Basic Metabolic Panel: Recent Labs  Lab 04/04/20 2204  NA 140  K 4.1  CL 100  CO2 25  GLUCOSE 157*  BUN 23  CREATININE 1.05*  CALCIUM 9.7   Liver Function Tests: No results for input(s): AST, ALT, ALKPHOS, BILITOT, PROT, ALBUMIN in the last 168 hours. No results for input(s): LIPASE, AMYLASE in the last 168 hours. No results for input(s): AMMONIA in the last 168 hours. CBC: Recent Labs  Lab 04/04/20 2204  WBC 9.5  NEUTROABS 6.3  HGB 11.6*  HCT 35.2*  MCV 95.1  PLT 371   Cardiac Enzymes: No results for input(s): CKTOTAL, CKMB, CKMBINDEX, TROPONINI in the last 168 hours.  BNP  (last 3 results) No results for input(s): BNP in the last 8760 hours.  ProBNP (last 3 results) No results for input(s): PROBNP in the last 8760 hours.  CBG: No results for input(s): GLUCAP in the last 168 hours.  Radiological Exams on Admission: DG Chest Port 1 View  Result Date: 04/04/2020 CLINICAL DATA:  Chest pain EXAM: PORTABLE CHEST 1 VIEW COMPARISON:  10/13/2019 FINDINGS: Single frontal view of the chest demonstrates stable dual lead pacemaker. Cardiac silhouette is enlarged but stable. There is chronic central vascular congestion without airspace disease, effusion, or pneumothorax. No acute displaced fractures. IMPRESSION: 1. Stable exam, no acute process. Electronically Signed   By: Sharlet Salina M.D.   On: 04/04/2020 22:16    EKG: Independently reviewed. HR 90. MAT. QTc 502. No STEMI. RBBB. (Unchanged from prior).  Assessment/Plan Principal Problem:   Chest pain Active Problems:   Paroxysmal atrial  fibrillation ([place])   Tachycardia-bradycardia syndrome ([place])   Pacemaker   T2DM (type 2 diabetes mellitus) ([place])   HTN (hypertension)   Dyslipidemia   Morbid obesity with BMI of 45.0-49.9, adult ([place])  86 y.o. female with PMH of PAF on Eliquis with pacemaker, T2DM, HTN, HLD, CAD s/p PCI who presented to ED with chest pain and admitted for ACS rule-out.  Chest Pain ACS Rule-out - sub-sternal chest pain that lasted for 2 hours, onset at rest without radiation or pleuritic symptoms - hx of CAD s/p 2 stents  - Obese, T2DM, HLD, family hx - Heart Score 4 (mainly for risk factors) - received ASA by EMS - NPO - Consult Cards in AM for possible stress test - Tele  - Last Echo 08/2018 demonstrated EF 60-65%, mild/mod MR, mod TR, mod pulm HTN - appears euvolemic  - Last saw Cards EP 03/18/2020 - Trop 22>24; will trend one more and stop if stable   Persistent atrial fibrillation/atrial tach on Anti-coagulation  CAD s/p PCI - cont Diltiazem, Eliquis, Plavix  T2DM - last A1c  unknown; ordered - Cont PTA Metformin, will hold Amaryl as patient will be NPO - Sliding scale   HTN - cont PTA Ramipril, Torsemide  Dyslipidemia - cont PTA statin and gemfibrozil   Tachy-brady - pacemaker-[employer]   Code Status: Full  DVT Prophylaxis: PTA Eliquis  Family Communication: None Disposition Plan: Admit to Observation. Patient with chest pain and risk factors for MI requiring specialty consult and possible stress test. If stress test able to be performed later today and negative, anticipate discharge back to [facility].   Time spent: 50 minutes  Chauncey Mann, MD [facility] Pager [phone]

## 2020-04-06 ENCOUNTER — Inpatient Hospital Stay (HOSPITAL_COMMUNITY): Payer: Medicare Other

## 2020-04-06 DIAGNOSIS — R079 Chest pain, unspecified: Secondary | ICD-10-CM

## 2020-04-06 LAB — COMPREHENSIVE METABOLIC PANEL
ALT: 13 U/L (ref 0–44)
AST: 24 U/L (ref 15–41)
Albumin: 3.7 g/dL (ref 3.5–5.0)
Alkaline Phosphatase: 77 U/L (ref 38–126)
Anion gap: 13 (ref 5–15)
BUN: 23 mg/dL (ref 8–23)
CO2: 22 mmol/L (ref 22–32)
Calcium: 9.9 mg/dL (ref 8.9–10.3)
Chloride: 102 mmol/L (ref 98–111)
Creatinine, Ser: 1.05 mg/dL — ABNORMAL HIGH (ref 0.44–1.00)
GFR calc Af Amer: 56 mL/min — ABNORMAL LOW (ref >60)
GFR calc non Af Amer: 48 mL/min — ABNORMAL LOW (ref >60)
Glucose, Bld: 260 mg/dL — ABNORMAL HIGH (ref 70–99)
Potassium: 4.6 mmol/L (ref 3.5–5.1)
Sodium: 137 mmol/L (ref 135–145)
Total Bilirubin: 0.7 mg/dL (ref 0.3–1.2)
Total Protein: 7.1 g/dL (ref 6.5–8.1)

## 2020-04-06 LAB — PROTIME-INR
INR: 1.2 (ref 0.8–1.2)
Prothrombin Time: 15.2 seconds (ref 11.4–15.2)

## 2020-04-06 LAB — ECHOCARDIOGRAM COMPLETE
Height: 67 in
Weight: 4680 oz

## 2020-04-06 LAB — GLUCOSE, CAPILLARY
Glucose-Capillary: 203 mg/dL — ABNORMAL HIGH (ref 70–99)
Glucose-Capillary: 176 mg/dL — ABNORMAL HIGH (ref 70–99)

## 2020-04-06 NOTE — Progress Notes (Signed)
  Echocardiogram 2D Echocardiogram has been performed.  Kirsten Jensen 04/06/2020, 12:04 PM

## 2020-04-06 NOTE — Progress Notes (Signed)
Patient is from [facility]. CSW called and spoke with the patient's son, Dennie Bible. He or his wife will come pick the patient up when she is medically ready.   Drucilla Schmidt, MSW, [employer] Clinical Social Work [employer] [phone]

## 2020-04-06 NOTE — Discharge Instructions (Signed)

## 2020-04-06 NOTE — Discharge Summary (Signed)
Physician Discharge Summary  Kirsten HomansCarol Nestor ZOX:[id number] DOB: 03/12/1932 DOA: 04/04/2020  PCP: Melida QuitterWile, Kirsten H, MD  Admit date: 04/04/2020 Discharge date: 04/06/2020  Time spent: 40 minutes  Recommendations for Outpatient Follow-up:  1. Needs Chem-12 CBC in 1 week at her independent living and reported to her regular doctor 2. Limit salt intake etc. 3. No changes to medications on discharge  Discharge Diagnoses:  Principal Problem:   Chest pain Active Problems:   Paroxysmal atrial fibrillation (HCC)   Tachycardia-bradycardia syndrome (HCC)   Pacemaker   T2DM (type 2 diabetes mellitus) (HCC)   HTN (hypertension)   Dyslipidemia   Morbid obesity with BMI of 45.0-49.9, adult [facility](HCC)   Discharge Condition: Improved  Diet recommendation: Heart healthy low-salt  Filed Weights   04/05/20 1318 04/05/20 1520 04/06/20 0543  Weight: (!) 138 kg 133.3 kg 132.7 kg    History of present illness:  86 year oldCarriager Jensen Resident since 6/2020lady cardiac disease (CAD status post 2 stents heart score 4) multiple other comorbid's tachybradycardia + [employer] pacemaker presented to ED with chest pain, osa on cpap [occasional] Troponin trending 22-->24-->cardiology recommended to be consulted  EKG my over read from 04/05/2020 at 130 shows atrial flutter with PVCs QRS axis -15 LAFB RSR prime V1 through V3 and no ST-T wave depressions although cannot really interpret EKG given presence of PPM  Tell me that the pain in the chest was more of an "ache" --started around 1820 and went on for a couple of hours--they chekd on her and sent her here.it was constant up until sometime lastg evening when it just went away--out of 10 the pain was 7/10--releived by "nothing"---asa in the ambuilance didn't seem to make a difference --the pain just went away on its own No sour nor reflux feeling in the chest---she was told she might have had runs of flutter this am She DID NOT have pain when she had here  HA "had a "squiggly feeling" from wasist ip Mnitors confirm runs of a fib with some PVC  Cardiology saw the patient in consult and secondary to her having prior disease with 2 stents and atypical features they obtained an echocardiogram the echocardiogram is as below  IMPRESSIONS    1. Left ventricular ejection fraction, by estimation, is 50%. The left  ventricle has low normal function. Left ventricular endocardial border not  optimally defined to evaluate regional wall motion. The left ventricular  internal cavity size was mildly  dilated. Left ventricular diastolic parameters are indeterminate.  2. Right ventricular systolic function is normal. The right ventricular  size is mildly enlarged. There is mildly elevated pulmonary artery  systolic pressure. The estimated right ventricular systolic pressure is  39.0 mmHg.  3. Left atrial size was mildly dilated.  4. Right atrial size was mildly dilated.  5. The mitral valve is degenerative. Mild mitral valve regurgitation. No  evidence of mitral stenosis.  6. The aortic valve is normal in structure. Aortic valve regurgitation is  not visualized. No aortic stenosis is present.  7. The inferior vena cava is normal in size with greater than 50%  respiratory variability, suggesting right atrial pressure of 3 mmHg   She is feeling really well today and is not in any distress she is eating and drinking she is not on oxygen she has no chest pain she has no fever she has no cough cold blurred vision double vision and she is moving around in the room    Discharge Exam: Vitals:   04/06/20 0945  04/06/20 1210  BP: (!) 153/74 (!) 117/105  Pulse: 92 99  Resp: 20 18  Temp:  99.7 F (37.6 C)  SpO2: 98% 97%    General: EOMI NCAT no focal deficit S1-S2 no murmur rub or gallop chest clinically clear no added sound no rales no rhonchi abdomen soft no rebound no guarding Neurologically intact no focal deficit ROM intact No lower  extremity edema power/5/5 Discharge Instructions    Allergies as of 04/06/2020   No Known Allergies     Medication List    TAKE these medications   acetaminophen 500 MG tablet Commonly known as: TYLENOL Take 500 mg by mouth 2 (two) times daily as needed for mild pain.   acetaminophen-codeine 300-60 MG tablet Commonly known as: TYLENOL #4 Take 1 tablet by mouth 3 (three) times daily as needed for pain.   buPROPion 100 MG tablet Commonly known as: WELLBUTRIN Take 100 mg by mouth daily.   clopidogrel 75 MG tablet Commonly known as: PLAVIX Take 75 mg by mouth daily.   diltiazem 120 MG 24 hr capsule Commonly known as: Cardizem CD Take 1 capsule (120 mg total) by mouth daily.   Eliquis 5 MG Tabs tablet Generic drug: apixaban Take 5 mg by mouth 2 (two) times daily.   gemfibrozil 600 MG tablet Commonly known as: LOPID Take 600 mg by mouth 2 (two) times daily before a meal.   glimepiride 2 MG tablet Commonly known as: AMARYL Take 2 mg by mouth daily with breakfast.   ketoconazole 2 % shampoo Commonly known as: NIZORAL Apply 1 application topically daily as needed for irritation. Let sit for 3 minutes before rinsing.   levothyroxine 50 MCG tablet Commonly known as: SYNTHROID Take 50 mcg by mouth daily before breakfast.   magnesium oxide 400 MG tablet Commonly known as: MAG-OX Take 1 tablet (400 mg total) by mouth daily.   metFORMIN 500 MG 24 hr tablet Commonly known as: GLUCOPHAGE-XR Take 1,000 mg by mouth 2 (two) times daily.   OCUVITE ADULT 50+ PO Take 1 capsule by mouth daily.   pravastatin 80 MG tablet Commonly known as: PRAVACHOL Take 80 mg by mouth daily.   ramipril 5 MG capsule Commonly known as: ALTACE Take 5 mg by mouth daily.   torsemide 20 MG tablet Commonly known as: DEMADEX Take 1 tablet (20 mg total) by mouth daily.   traMADol 50 MG tablet Commonly known as: ULTRAM Take 50 mg by mouth every 8 (eight) hours as needed for moderate pain.    vitamin B-12 1000 MCG tablet Commonly known as: CYANOCOBALAMIN Take 1,000 mcg by mouth daily.   Vitamin D-3 25 MCG (1000 UT) Caps Take 1 capsule by mouth daily.      No Known Allergies    The results of significant diagnostics from this hospitalization (including imaging, microbiology, ancillary and laboratory) are listed below for reference.    Significant Diagnostic Studies: DG Chest Port 1 View  Result Date: 04/04/2020 CLINICAL DATA:  Chest pain EXAM: PORTABLE CHEST 1 VIEW COMPARISON:  10/13/2019 FINDINGS: Single frontal view of the chest demonstrates stable dual lead pacemaker. Cardiac silhouette is enlarged but stable. There is chronic central vascular congestion without airspace disease, effusion, or pneumothorax. No acute displaced fractures. IMPRESSION: 1. Stable exam, no acute process. Electronically Signed   By: Sharlet Salina M.D.   On: 04/04/2020 22:16   ECHOCARDIOGRAM COMPLETE  Result Date: 04/06/2020    ECHOCARDIOGRAM REPORT   Patient Name:   Kirsten Jensen Date of Exam:  04/06/2020 Medical Rec #:  [id number]    Height:       67.0 in Accession #:    [id number]   Weight:       292.5 lb Date of Birth:  [birth year]     BSA:          2.377 m Patient Age:    86 years     BP:           149/90 mmHg Patient Gender: F            HR:           77 bpm. Exam Location:  Inpatient Procedure: 2D Echo, Cardiac Doppler and Color Doppler Indications:    Chest Pain 786.50/ R07.9  History:        Patient has no prior history of Echocardiogram examinations.                 Pacemaker, Arrythmias:Atrial Fibrillation and Atrial Flutter,                 Signs/Symptoms:Chest Pain; Risk Factors:Hypertension, Diabetes,                 Dyslipidemia and Non-Smoker.  Sonographer:    Vickie Epley RDCS Referring Phys: [id number] [place]  1. Left ventricular ejection fraction, by estimation, is 50%. The left ventricle has low normal function. Left ventricular endocardial border not optimally defined  to evaluate regional wall motion. The left ventricular internal cavity size was mildly dilated. Left ventricular diastolic parameters are indeterminate.  2. Right ventricular systolic function is normal. The right ventricular size is mildly enlarged. There is mildly elevated pulmonary artery systolic pressure. The estimated right ventricular systolic pressure is 67.3 mmHg.  3. Left atrial size was mildly dilated.  4. Right atrial size was mildly dilated.  5. The mitral valve is degenerative. Mild mitral valve regurgitation. No evidence of mitral stenosis.  6. The aortic valve is normal in structure. Aortic valve regurgitation is not visualized. No aortic stenosis is present.  7. The inferior vena cava is normal in size with greater than 50% respiratory variability, suggesting right atrial pressure of 3 mmHg. FINDINGS  Left Ventricle: Left ventricular ejection fraction, by estimation, is 50%. The left ventricle has low normal function. Left ventricular endocardial border not optimally defined to evaluate regional wall motion. The left ventricular internal cavity size was mildly dilated. There is no left ventricular hypertrophy. Left ventricular diastolic parameters are indeterminate.  LV Wall Scoring: The posterior wall and mid inferior segment are hypokinetic. Right Ventricle: The right ventricular size is mildly enlarged. No increase in right ventricular wall thickness. Right ventricular systolic function is normal. There is mildly elevated pulmonary artery systolic pressure. The tricuspid regurgitant velocity is 3.00 m/s, and with an assumed right atrial pressure of 3 mmHg, the estimated right ventricular systolic pressure is 41.9 mmHg. Left Atrium: Left atrial size was mildly dilated. Right Atrium: Right atrial size was mildly dilated. Pericardium: There is no evidence of pericardial effusion. Mitral Valve: The mitral valve is degenerative in appearance. Normal mobility of the mitral valve leaflets. Moderate  mitral annular calcification. Mild mitral valve regurgitation. No evidence of mitral valve stenosis. Tricuspid Valve: The tricuspid valve is normal in structure. Tricuspid valve regurgitation is mild . No evidence of tricuspid stenosis. Aortic Valve: The aortic valve is normal in structure. Aortic valve regurgitation is not visualized. No aortic stenosis is present. Pulmonic Valve: The pulmonic valve was not well visualized. Pulmonic valve  regurgitation is not visualized. No evidence of pulmonic stenosis. Aorta: The aortic root is normal in size and structure. Venous: The inferior vena cava was not well visualized. The inferior vena cava is normal in size with greater than 50% respiratory variability, suggesting right atrial pressure of 3 mmHg. IAS/Shunts: The interatrial septum was not well visualized.  LEFT VENTRICLE PLAX 2D LVIDd:         5.90 cm LVIDs:         4.50 cm LV PW:         0.70 cm LV IVS:        0.70 cm LVOT diam:     2.00 cm LV SV:         51 LV SV Index:   21 LVOT Area:     3.14 cm  RIGHT VENTRICLE RV S prime:     11.60 cm/s TAPSE (M-mode): 1.9 cm LEFT ATRIUM             Index       RIGHT ATRIUM           Index LA diam:        4.00 cm 1.68 cm/m  RA Area:     18.00 cm LA Vol (A2C):   60.4 ml 25.41 ml/m RA Volume:   49.20 ml  20.70 ml/m LA Vol (A4C):   50.8 ml 21.37 ml/m LA Biplane Vol: 57.3 ml 24.11 ml/m  AORTIC VALVE LVOT Vmax:   80.90 cm/s LVOT Vmean:  55.900 cm/s LVOT VTI:    0.161 m  AORTA Ao Root diam: 3.10 cm MR Peak grad: 104.0 mmHg  TRICUSPID VALVE MR Vmax:      510.00 cm/s TR Peak grad:   36.0 mmHg                           TR Vmax:        300.00 cm/s                            SHUNTS                           Systemic VTI:  0.16 m                           Systemic Diam: 2.00 cm Weston Brass MD Electronically signed by Weston Brass MD Signature Date/Time: 04/06/2020/12:11:46 PM    Final    CUP PACEART REMOTE DEVICE CHECK  Result Date: 03/11/2020 Scheduled remote reviewed.  Normal device function. R wave varied, 2.5-7.70mv since last remote. Appropriate sensing noted on presenting. Persistent AF since 12/15/19 with rates predominately < 100bpm. Rate control strategy adopted per Epic note (AFib OV 02/19/20). 3 VHR episodes, 2 AF/RVR, 2 NSVT, 11 beats. OAC- Eliquis Next remote 91 days- JBox, RN/CVRS   Microbiology: Recent Results (from the past 240 hour(s))  SARS CORONAVIRUS 2 (TAT 6-24 HRS) Nasopharyngeal Nasopharyngeal Swab     Status: None   Collection Time: 04/05/20  1:41 AM   Specimen: Nasopharyngeal Swab  Result Value Ref Range Status   SARS Coronavirus 2 NEGATIVE NEGATIVE Final    Comment: (NOTE) SARS-CoV-2 target nucleic acids are NOT DETECTED. The SARS-CoV-2 RNA is generally detectable in upper and lower respiratory specimens during the acute phase of infection. Negative results do not preclude SARS-CoV-2 infection, do not  rule out co-infections with other pathogens, and should not be used as the sole basis for treatment or other patient management decisions. Negative results must be combined with clinical observations, patient history, and epidemiological information. The expected result is Negative. Fact Sheet for Patients: HairSlick.no Fact Sheet for Healthcare Providers: quierodirigir.com This test is not yet approved or cleared by the [place] FDA and  has been authorized for detection and/or diagnosis of SARS-CoV-2 by FDA under an Emergency Use Authorization (EUA). This EUA will remain  in effect (meaning this test can be used) for the duration of the COVID-19 declaration under Section 56 4(b)(1) of the Act, 21 U.S.C. section 360bbb-3(b)(1), unless the authorization is terminated or revoked sooner. Performed at [facility], [address], [place], [place] [address]   MRSA PCR Screening     Status: None   Collection Time: 04/05/20  8:19 PM   Specimen: Nasal Mucosa;  Nasopharyngeal  Result Value Ref Range Status   MRSA by PCR NEGATIVE NEGATIVE Final    Comment:        The GeneXpert MRSA Assay (FDA approved for NASAL specimens only), is one component of a comprehensive MRSA colonization surveillance program. It is not intended to diagnose MRSA infection nor to guide or monitor treatment for MRSA infections. Performed at [facility], [address], [place], [place] [address]      Labs: Basic Metabolic Panel: Recent Labs  Lab 04/04/20 2204 04/05/20 0409 04/06/20 0820  NA 140  --  137  K 4.1  --  4.6  CL 100  --  102  CO2 25  --  22  GLUCOSE 157*  --  260*  BUN 23  --  23  CREATININE 1.05*  --  1.05*  CALCIUM 9.7  --  9.9  MG  --  1.3*  --    Liver Function Tests: Recent Labs  Lab 04/06/20 0820  AST 24  ALT 13  ALKPHOS 77  BILITOT 0.7  PROT 7.1  ALBUMIN 3.7   No results for input(s): LIPASE, AMYLASE in the last 168 hours. No results for input(s): AMMONIA in the last 168 hours. CBC: Recent Labs  Lab 04/04/20 2204  WBC 9.5  NEUTROABS 6.3  HGB 11.6*  HCT 35.2*  MCV 95.1  PLT 371   Cardiac Enzymes: No results for input(s): CKTOTAL, CKMB, CKMBINDEX, TROPONINI in the last 168 hours. BNP: BNP (last 3 results) No results for input(s): BNP in the last 8760 hours.  ProBNP (last 3 results) No results for input(s): PROBNP in the last 8760 hours.  CBG: Recent Labs  Lab 04/05/20 1214 04/05/20 1654 04/05/20 2109 04/06/20 0745 04/06/20 1211  GLUCAP 141* 220* 190* 203* 176*       Signed:  Rhetta Mura MD   [facility] 04/06/2020, 12:35 PM

## 2020-04-06 NOTE — Progress Notes (Signed)
Patient discharged: back to facility   Via: Wheelchair   Discharge paperwork given: to patient and family  Reviewed with teach back  IV and telemetry disconnected  Belongings given to patient

## 2020-04-06 NOTE — TOC Transition Note (Signed)
Transition of Care [facility]) - CM/SW Discharge Note   Patient Details  Name: Kirsten Jensen MRN: [id number] Date of Birth: 1932/06/08  Transition of Care [facility]) CM/SW Contact:  Gwenlyn Fudge, LCSWA Phone Number: 04/06/2020, 3:03 PM   Clinical Narrative:     Patient discharging back to her Assisted Living Facility, [facility]. CSW faxed over discharge summary and Fl2 for them to review. Pt's son Dennie Bible, will provide transportation. No additional TOC needs.   Final next level of care: Assisted Living Barriers to Discharge: No Barriers Identified   Patient Goals and CMS Choice Patient states their goals for this hospitalization and ongoing recovery are:: Pt will return to [facility] CMS Medicare.gov Compare Post Acute Care list provided to:: Patient Choice offered to / list presented to : Patient  Discharge Placement                  Name of family member notified: Pat Patient and family notified of of transfer: 04/06/20  Discharge Plan and Services                                     Social Determinants of Health (SDOH) Interventions     Readmission Risk Interventions No flowsheet data found.

## 2020-04-06 NOTE — Progress Notes (Signed)
Pt ready for d/c, awaiting on ride. IV and teli removed Paperwork given

## 2020-04-06 NOTE — Progress Notes (Signed)
Progress Note  Patient Name: Kirsten Jensen Date of Encounter: 04/06/2020  Primary Cardiologist: Dr. Caryl Comes  Subjective   Feels well, no concerns. Eager to go home.   Inpatient Medications    Scheduled Meds: . apixaban  5 mg Oral BID  . buPROPion  100 mg Oral Q breakfast  . cholecalciferol  1,000 Units Oral Daily  . clopidogrel  75 mg Oral Daily  . diltiazem  120 mg Oral Daily  . gemfibrozil  600 mg Oral BID AC  . insulin aspart  0-9 Units Subcutaneous TID WC  . levothyroxine  50 mcg Oral QAC breakfast  . magnesium oxide  400 mg Oral Daily  . metFORMIN  1,000 mg Oral BID WC  . pravastatin  80 mg Oral Daily  . ramipril  5 mg Oral Daily  . torsemide  20 mg Oral Daily  . vitamin B-12  1,000 mcg Oral Daily   Continuous Infusions:  PRN Meds: acetaminophen, traMADol   Vital Signs    Vitals:   04/06/20 0543 04/06/20 0740 04/06/20 0945 04/06/20 1210  BP: (!) 141/73 (!) 149/90 (!) 153/74 (!) 117/105  Pulse: 86 (!) 48 92 99  Resp: 20 19 20 18   Temp: 97.6 F (36.4 C) 98.4 F (36.9 C)  99.7 F (37.6 C)  TempSrc: Oral Oral  Oral  SpO2: 96% 98% 98% 97%  Weight: 132.7 kg     Height:        Intake/Output Summary (Last 24 hours) at 04/06/2020 1213 Last data filed at 04/06/2020 1203 Gross per 24 hour  Intake 960 ml  Output 250 ml  Net 710 ml   Last 3 Weights 04/06/2020 04/05/2020 04/05/2020  Weight (lbs) 292 lb 8 oz 293 lb 14 oz 304 lb 3.8 oz  Weight (kg) 132.677 kg 133.3 kg 138 kg      Telemetry    Atrial fibrillation/flutter - Personally Reviewed  ECG    No new - Personally Reviewed  Physical Exam   GEN: No acute distress.   Neck: No JVD Cardiac: iRRR, no murmurs, rubs, or gallops.  Respiratory: Clear to auscultation bilaterally. GI: Soft, nontender, non-distended  MS: No edema; No deformity. Neuro:  Nonfocal  Psych: Normal affect   Labs    High Sensitivity Troponin:   Recent Labs  Lab 04/04/20 2204 04/05/20 0141 04/05/20 0450  TROPONINIHS 22* 24*  22*      Chemistry Recent Labs  Lab 04/04/20 2204 04/06/20 0820  NA 140 137  K 4.1 4.6  CL 100 102  CO2 25 22  GLUCOSE 157* 260*  BUN 23 23  CREATININE 1.05* 1.05*  CALCIUM 9.7 9.9  PROT  --  7.1  ALBUMIN  --  3.7  AST  --  24  ALT  --  13  ALKPHOS  --  77  BILITOT  --  0.7  GFRNONAA 48* 48*  GFRAA 56* 56*  ANIONGAP 15 13     Hematology Recent Labs  Lab 04/04/20 2204  WBC 9.5  RBC 3.70*  HGB 11.6*  HCT 35.2*  MCV 95.1  MCH 31.4  MCHC 33.0  RDW 13.2  PLT 371    BNPNo results for input(s): BNP, PROBNP in the last 168 hours.   DDimer No results for input(s): DDIMER in the last 168 hours.   Radiology    DG Chest Port 1 View  Result Date: 04/04/2020 CLINICAL DATA:  Chest pain EXAM: PORTABLE CHEST 1 VIEW COMPARISON:  10/13/2019 FINDINGS: Single frontal view of the  chest demonstrates stable dual lead pacemaker. Cardiac silhouette is enlarged but stable. There is chronic central vascular congestion without airspace disease, effusion, or pneumothorax. No acute displaced fractures. IMPRESSION: 1. Stable exam, no acute process. Electronically Signed   By: Sharlet Salina M.D.   On: 04/04/2020 22:16   ECHOCARDIOGRAM COMPLETE  Result Date: 04/06/2020    ECHOCARDIOGRAM REPORT   Patient Name:   Kirsten Jensen Date of Exam: 04/06/2020 Medical Rec #:  [id number]    Height:       67.0 in Accession #:    [id number]   Weight:       292.5 lb Date of Birth:  17-Sep-1932     BSA:          2.377 m Patient Age:    87 years     BP:           149/90 mmHg Patient Gender: F            HR:           77 bpm. Exam Location:  Inpatient Procedure: 2D Echo, Cardiac Doppler and Color Doppler Indications:    Chest Pain 786.50/ R07.9  History:        Patient has no prior history of Echocardiogram examinations.                 Pacemaker, Arrythmias:Atrial Fibrillation and Atrial Flutter,                 Signs/Symptoms:Chest Pain; Risk Factors:Hypertension, Diabetes,                 Dyslipidemia and  Non-Smoker.  Sonographer:    Renella Cunas RDCS Referring Phys: [id number] CALLIE E GOODRICH IMPRESSIONS  1. Left ventricular ejection fraction, by estimation, is 50%. The left ventricle has low normal function. Left ventricular endocardial border not optimally defined to evaluate regional wall motion. The left ventricular internal cavity size was mildly dilated. Left ventricular diastolic parameters are indeterminate.  2. Right ventricular systolic function is normal. The right ventricular size is mildly enlarged. There is mildly elevated pulmonary artery systolic pressure. The estimated right ventricular systolic pressure is 39.0 mmHg.  3. Left atrial size was mildly dilated.  4. Right atrial size was mildly dilated.  5. The mitral valve is degenerative. Mild mitral valve regurgitation. No evidence of mitral stenosis.  6. The aortic valve is normal in structure. Aortic valve regurgitation is not visualized. No aortic stenosis is present.  7. The inferior vena cava is normal in size with greater than 50% respiratory variability, suggesting right atrial pressure of 3 mmHg. FINDINGS  Left Ventricle: Left ventricular ejection fraction, by estimation, is 50%. The left ventricle has low normal function. Left ventricular endocardial border not optimally defined to evaluate regional wall motion. The left ventricular internal cavity size was mildly dilated. There is no left ventricular hypertrophy. Left ventricular diastolic parameters are indeterminate.  LV Wall Scoring: The posterior wall and mid inferior segment are hypokinetic. Right Ventricle: The right ventricular size is mildly enlarged. No increase in right ventricular wall thickness. Right ventricular systolic function is normal. There is mildly elevated pulmonary artery systolic pressure. The tricuspid regurgitant velocity is 3.00 m/s, and with an assumed right atrial pressure of 3 mmHg, the estimated right ventricular systolic pressure is 39.0 mmHg. Left Atrium: Left  atrial size was mildly dilated. Right Atrium: Right atrial size was mildly dilated. Pericardium: There is no evidence of pericardial effusion. Mitral Valve: The mitral valve is degenerative in  appearance. Normal mobility of the mitral valve leaflets. Moderate mitral annular calcification. Mild mitral valve regurgitation. No evidence of mitral valve stenosis. Tricuspid Valve: The tricuspid valve is normal in structure. Tricuspid valve regurgitation is mild . No evidence of tricuspid stenosis. Aortic Valve: The aortic valve is normal in structure. Aortic valve regurgitation is not visualized. No aortic stenosis is present. Pulmonic Valve: The pulmonic valve was not well visualized. Pulmonic valve regurgitation is not visualized. No evidence of pulmonic stenosis. Aorta: The aortic root is normal in size and structure. Venous: The inferior vena cava was not well visualized. The inferior vena cava is normal in size with greater than 50% respiratory variability, suggesting right atrial pressure of 3 mmHg. IAS/Shunts: The interatrial septum was not well visualized.  LEFT VENTRICLE PLAX 2D LVIDd:         5.90 cm LVIDs:         4.50 cm LV PW:         0.70 cm LV IVS:        0.70 cm LVOT diam:     2.00 cm LV SV:         51 LV SV Index:   21 LVOT Area:     3.14 cm  RIGHT VENTRICLE RV S prime:     11.60 cm/s TAPSE (M-mode): 1.9 cm LEFT ATRIUM             Index       RIGHT ATRIUM           Index LA diam:        4.00 cm 1.68 cm/m  RA Area:     18.00 cm LA Vol (A2C):   60.4 ml 25.41 ml/m RA Volume:   49.20 ml  20.70 ml/m LA Vol (A4C):   50.8 ml 21.37 ml/m LA Biplane Vol: 57.3 ml 24.11 ml/m  AORTIC VALVE LVOT Vmax:   80.90 cm/s LVOT Vmean:  55.900 cm/s LVOT VTI:    0.161 m  AORTA Ao Root diam: 3.10 cm MR Peak grad: 104.0 mmHg  TRICUSPID VALVE MR Vmax:      510.00 cm/s TR Peak grad:   36.0 mmHg                           TR Vmax:        300.00 cm/s                            SHUNTS                           Systemic VTI:  0.16  m                           Systemic Diam: 2.00 cm Weston Brass MD Electronically signed by Weston Brass MD Signature Date/Time: 04/06/2020/12:11:46 PM    Final     Cardiac Studies   Echo with low normal EF, known regional wall motion abnormalities based on review of stress testing.   Patient Profile     Heavenly Christine is a 86 y.o. female with a history of CAD s/p prior stenting, persistent atrial fibrillation/flutter on Eliquis, tachybrady syndrome s/p PPM, hypertension, diabetes mellitus, and obstructive sleep apnea who is being seen today for the evaluation of atrial flutter and chest pain at the request of Dr. Mahala Menghini.  Assessment &  Plan    Chest Pain with Known CAD - Patient present with chest pain at rest. Patient does have a history CAD with 2 prior stents placed in [place]. Patient thinks last stent was placed about 5 years ago.  - EKG shows no acute ST/T changes.  - High-sensitivity troponin minimally elevated and flat at 22 >> 24 >> 22. Not consistent with ACS. - Currently chest pain free.  - Will check Echo to assess EF and wall motion. - Presentation very atypical. Possible GI related given onset after meal. She also notes chest wall tenderness to left of breastbone so possible costochondritis. Patient also admits she has been under a lot of stress lately so this may be playing a role. - Continue Plavix and statin. - echo with low normal EF 50% and RWMA in the distribution of perfusion defects seen on prior stress testing. Suggests no change over time.  - consider outpatient stress test, discussed with patient and participated in shared decision making.   Persistent Atrial Fibrillation/ Atrial Flutter/ Tachybrady Syndrome s/p PPM - Patient has a dual chamber [employer] PPM implanted in 2019. Last device check on 03/11/20 showed persistent atrial fibrillation since 12/15/2019 mostly rate controlled.  - Presented in what looks like possible atrial flutter vs. atrial  fibrillation. Rate controlled. - Continue Cardizem 120mg  daily. - Continue chronic anticoagulation with Eliquis 5mg  twice daily.  - Device reportedly interrogated in the ED but I could not find the report.  Hypertension - BP well controlled. - Continue home medications: Ramipril 5mg  daily and Cardizem 120mg  daily.   Diabetes Mellitus - Hemoglobin A1c 8.0 this admission. - Currently on sliding scale insulin. - Management per primary team  Obstructive Sleep Apnea - Compliant with CPAP.  Morbid Obesity - BMI 47.     For questions or updates, please contact [facility] Please consult [email or web address] for contact info under        Signed, , MD  04/06/2020, 12:13 PM

## 2020-04-06 NOTE — NC FL2 (Signed)
[place] MEDICAID FL2 LEVEL OF CARE SCREENING TOOL     IDENTIFICATION  Patient Name: Kirsten Jensen Birthdate: 01/19/1932 Sex: female Admission Date (Current Location): 04/04/2020  [facility] and [place] Number:  Producer, television/film/video and Address:  [facility] [place], [address] [place], [place] [address]      Provider Number: [id number]  Attending Physician Name and Address:  Rhetta Mura, MD  Relative Name and Phone Number:  [place], [place], [phone]    Current Level of Care: Hospital Recommended Level of Care: Assisted Living Facility Prior Approval Number:    Date Approved/Denied:   PASRR Number:    Discharge Plan: Domiciliary (Rest home)    Current Diagnoses: Patient Active Problem List   Diagnosis Date Noted  . Chest pain 04/05/2020  . T2DM (type 2 diabetes mellitus) (HCC) 04/05/2020  . HTN (hypertension) 04/05/2020  . Dyslipidemia 04/05/2020  . Morbid obesity with BMI of 45.0-49.9, adult (HCC) 04/05/2020  . Tachycardia-bradycardia syndrome (HCC) 03/17/2020  . Pacemaker 03/17/2020  . Atypical atrial flutter (HCC) 02/05/2020  . Paroxysmal atrial fibrillation (HCC) 11/06/2019  . Secondary hypercoagulable state (HCC) 11/06/2019    Orientation RESPIRATION BLADDER Height & Weight     Self, Time, Situation, Place  Normal Continent Weight: 132.7 kg(scale a) Height:  5\' 7"  (170.2 cm)  BEHAVIORAL SYMPTOMS/MOOD NEUROLOGICAL BOWEL NUTRITION STATUS      Continent Diet(Low sodium, heart healthy diet, thin liquids)  AMBULATORY STATUS COMMUNICATION OF NEEDS Skin   Independent Verbally Normal                       Personal Care Assistance Level of Assistance  Bathing, Feeding, Dressing Bathing Assistance: Independent Feeding assistance: Independent Dressing Assistance: Independent     Functional Limitations Info  Sight, Hearing, Speech Sight Info: Impaired Hearing Info: Adequate Speech Info: Adequate    SPECIAL CARE FACTORS  FREQUENCY                       Contractures Contractures Info: Not present    Additional Factors Info  Code Status, Allergies Code Status Info: Full Code Allergies Info: No Known Allergies           Current Medications (04/06/2020):  This is the current hospital active medication list Current Facility-Administered Medications  Medication Dose Route Frequency Provider Last Rate Last Admin  . acetaminophen (TYLENOL) tablet 500 mg  500 mg Oral BID PRN 04/08/2020, MD   500 mg at 04/05/20 1442  . apixaban (ELIQUIS) tablet 5 mg  5 mg Oral BID 04/07/20, MD   5 mg at 04/06/20 0946  . buPROPion [facility]) tablet 100 mg  100 mg Oral Q breakfast Fair, [facility], MD   100 mg at 04/06/20 0949  . cholecalciferol (VITAMIN D3) tablet 1,000 Units  1,000 Units Oral Daily 04/08/20, MD   1,000 Units at 04/06/20 0946  . clopidogrel (PLAVIX) tablet 75 mg  75 mg Oral Daily 04/08/20, MD   75 mg at 04/06/20 0946  . diltiazem (CARDIZEM CD) 24 hr capsule 120 mg  120 mg Oral Daily Fair, 04/08/20, MD   120 mg at 04/06/20 0946  . gemfibrozil (LOPID) tablet 600 mg  600 mg Oral BID AC Fair, 04/08/20, MD   600 mg at 04/06/20 0950  . insulin aspart (novoLOG) injection 0-9 Units  0-9 Units Subcutaneous TID WC Fair, 04/08/20, MD   3 Units  at 04/06/20 0958  . levothyroxine (SYNTHROID) tablet 50 mcg  50 mcg Oral QAC breakfast Fair, Marin Shutter, MD   50 mcg at 04/06/20 0600  . magnesium oxide (MAG-OX) tablet 400 mg  400 mg Oral Daily Fair, Marin Shutter, MD   400 mg at 04/06/20 0947  . metFORMIN (GLUCOPHAGE-XR) 24 hr tablet 1,000 mg  1,000 mg Oral BID WC Fair, Marin Shutter, MD   1,000 mg at 04/06/20 0955  . pravastatin (PRAVACHOL) tablet 80 mg  80 mg Oral Daily Chauncey Mann, MD   80 mg at 04/05/20 0959  . ramipril (ALTACE) capsule 5 mg  5 mg Oral Daily Fair, Marin Shutter, MD   5 mg at 04/06/20 0948  . torsemide (DEMADEX) tablet 20 mg  20 mg Oral Daily Fair, Marin Shutter, MD   20 mg at 04/06/20 0946   . traMADol (ULTRAM) tablet 50 mg  50 mg Oral Q8H PRN Chauncey Mann, MD   50 mg at 04/05/20 0739  . vitamin B-12 (CYANOCOBALAMIN) tablet 1,000 mcg  1,000 mcg Oral Daily Chauncey Mann, MD   1,000 mcg at 04/06/20 0539     Discharge Medications: Please see discharge summary for a list of discharge medications.  Relevant Imaging Results:  Relevant Lab Results:   Additional Information SSN: [id number]; COVID negative on 4/24  Tamira Ryland B Gilmar Bua, LCSWA

## 2020-04-06 NOTE — NC FL2 (Signed)
[place] MEDICAID FL2 LEVEL OF CARE SCREENING TOOL     IDENTIFICATION  Patient Name: Kirsten Jensen Birthdate: April 11, 1932 Sex: female Admission Date (Current Location): 04/04/2020  [facility] and [place] Number:  Producer, television/film/video and Address:  [facility] [place]. [facility], [address] [place], [place] [address]      Provider Number: [id number]  Attending Physician Name and Address:  Rhetta Mura, MD  Relative Name and Phone Number:  Maiyah, [place], [phone]    Current Level of Care: Hospital Recommended Level of Care: Skilled Nursing Facility Prior Approval Number:    Date Approved/Denied:   PASRR Number:    Discharge Plan: Domiciliary (Rest home)    Current Diagnoses: Patient Active Problem List   Diagnosis Date Noted  . Chest pain 04/05/2020  . T2DM (type 2 diabetes mellitus) (HCC) 04/05/2020  . HTN (hypertension) 04/05/2020  . Dyslipidemia 04/05/2020  . Morbid obesity with BMI of 45.0-49.9, adult (HCC) 04/05/2020  . Tachycardia-bradycardia syndrome (HCC) 03/17/2020  . Pacemaker 03/17/2020  . Atypical atrial flutter (HCC) 02/05/2020  . Paroxysmal atrial fibrillation (HCC) 11/06/2019  . Secondary hypercoagulable state (HCC) 11/06/2019    Orientation RESPIRATION BLADDER Height & Weight     Self, Time, Situation, Place  Normal Continent Weight: 132.7 kg(scale a) Height:  5\' 7"  (170.2 cm)  BEHAVIORAL SYMPTOMS/MOOD NEUROLOGICAL BOWEL NUTRITION STATUS      Continent Diet(Low sodium, heart healthy diet, thin liquids)  AMBULATORY STATUS COMMUNICATION OF NEEDS Skin   Independent Verbally Normal                       Personal Care Assistance Level of Assistance  Bathing, Feeding, Dressing Bathing Assistance: Independent Feeding assistance: Independent Dressing Assistance: Independent     Functional Limitations Info  Sight, Hearing, Speech Sight Info: Impaired Hearing Info: Adequate Speech Info: Adequate    SPECIAL CARE FACTORS  FREQUENCY                       Contractures Contractures Info: Not present    Additional Factors Info  Code Status, Allergies Code Status Info: Full Code Allergies Info: No Known Allergies           Current Medications (04/06/2020):  This is the current hospital active medication list Current Facility-Administered Medications  Medication Dose Route Frequency Provider Last Rate Last Admin  . acetaminophen (TYLENOL) tablet 500 mg  500 mg Oral BID PRN 04/08/2020, MD   500 mg at 04/05/20 1442  . apixaban (ELIQUIS) tablet 5 mg  5 mg Oral BID 04/07/20, MD   5 mg at 04/06/20 0946  . buPROPion [facility]) tablet 100 mg  100 mg Oral Q breakfast Fair, [facility], MD   100 mg at 04/06/20 0949  . cholecalciferol (VITAMIN D3) tablet 1,000 Units  1,000 Units Oral Daily 04/08/20, MD   1,000 Units at 04/06/20 0946  . clopidogrel (PLAVIX) tablet 75 mg  75 mg Oral Daily 04/08/20, MD   75 mg at 04/06/20 0946  . diltiazem (CARDIZEM CD) 24 hr capsule 120 mg  120 mg Oral Daily Fair, 04/08/20, MD   120 mg at 04/06/20 0946  . gemfibrozil (LOPID) tablet 600 mg  600 mg Oral BID AC Fair, 04/08/20, MD   600 mg at 04/06/20 0950  . insulin aspart (novoLOG) injection 0-9 Units  0-9 Units Subcutaneous TID WC Fair, 04/08/20, MD   3 Units  at 04/06/20 0958  . levothyroxine (SYNTHROID) tablet 50 mcg  50 mcg Oral QAC breakfast Fair, Marin Shutter, MD   50 mcg at 04/06/20 0600  . magnesium oxide (MAG-OX) tablet 400 mg  400 mg Oral Daily Fair, Marin Shutter, MD   400 mg at 04/06/20 0947  . metFORMIN (GLUCOPHAGE-XR) 24 hr tablet 1,000 mg  1,000 mg Oral BID WC Fair, Marin Shutter, MD   1,000 mg at 04/06/20 0955  . pravastatin (PRAVACHOL) tablet 80 mg  80 mg Oral Daily Chauncey Mann, MD   80 mg at 04/05/20 0959  . ramipril (ALTACE) capsule 5 mg  5 mg Oral Daily Fair, Marin Shutter, MD   5 mg at 04/06/20 0948  . torsemide (DEMADEX) tablet 20 mg  20 mg Oral Daily Fair, Marin Shutter, MD   20 mg at 04/06/20 0946   . traMADol (ULTRAM) tablet 50 mg  50 mg Oral Q8H PRN Chauncey Mann, MD   50 mg at 04/05/20 0739  . vitamin B-12 (CYANOCOBALAMIN) tablet 1,000 mcg  1,000 mcg Oral Daily Chauncey Mann, MD   1,000 mcg at 04/06/20 9798     Discharge Medications: Please see discharge summary for a list of discharge medications.  Relevant Imaging Results:  Relevant Lab Results:   Additional Information SSN: [id number]; COVID negative on 4/24  Chirag Krueger B Markelle Asaro, LCSWA

## 2020-04-07 ENCOUNTER — Telehealth: Payer: Self-pay | Admitting: Internal Medicine

## 2020-04-07 NOTE — Telephone Encounter (Signed)
New message   Patient would like a fax sent over to [facility] for an order for her b/p to be taken every morning. The fax # is [phone].

## 2020-04-08 NOTE — Telephone Encounter (Signed)
Ok

## 2020-04-08 NOTE — Telephone Encounter (Signed)
Attempted phone call to pt.  Left voicemail message to contact RN at [phone]. 

## 2020-04-08 NOTE — Telephone Encounter (Signed)
Order for daily B/P check faxed to [facility] [phone] per [place] of Otilio Saber, [place].  Fax placed in medical records for scan.

## 2020-04-10 NOTE — Telephone Encounter (Signed)
Attempted phone call to pt.  Left voicemail message to contact RN at [phone]. 

## 2020-04-14 ENCOUNTER — Telehealth: Payer: Self-pay | Admitting: Internal Medicine

## 2020-04-14 NOTE — Telephone Encounter (Signed)
Patient states she is returning Kirsten Jensen's call from Friday 04/11/20. Not sure what it was in regards to.

## 2020-04-15 NOTE — Telephone Encounter (Signed)
Spoke with pt who states she is unaware when she is is afib or flutter so it is difficult for her to answer if she had any improvement in symptoms.  Pt states she is feeling well and continues medications as prescribed.  Pt made aware of appointment scheduled with Francis Dowse for 05/06/2020.  Pt verbalizes understanding and agrees with current plan.

## 2020-04-15 NOTE — Telephone Encounter (Signed)
Patient aware Kirsten Jensen will be calling her shortly.

## 2020-04-22 ENCOUNTER — Ambulatory Visit: Payer: Medicare Other | Admitting: Family Medicine

## 2020-04-22 ENCOUNTER — Encounter: Payer: Self-pay | Admitting: Registered"

## 2020-04-22 ENCOUNTER — Other Ambulatory Visit: Payer: Self-pay

## 2020-04-22 ENCOUNTER — Encounter: Payer: Medicare Other | Attending: Internal Medicine | Admitting: Registered"

## 2020-04-22 DIAGNOSIS — E119 Type 2 diabetes mellitus without complications: Secondary | ICD-10-CM | POA: Insufficient documentation

## 2020-04-22 NOTE — Progress Notes (Addendum)
  Appointment start time: 2:05  Appointment end time: 3:05  Patient was seen on 5/11//2021 for nutrition counseling pertaining to disordered eating  Primary care provider: Florentina Jenny, MD Therapist: Crecencio Mc; weekly appts  ROI: none Any other medical team members: none  Assessment  Pt reports she was recently in emergency room due to chest pain. Per recent labs A1c was 8.0 (an increased from 7.7 reported for Dec 2020). Glu was 260. Pt reports she has BS checked 2x/day: FBS (79-147) and at 4pm (91-191). States she does not judge well with eating; feels she needs to be eating less and sometimes overeats. States she listens to [employer].  States its hard for her to take in a new thought pattern and knowing what it means to her personally, related to not finding security in weight loss anymore. Reports she wants to lose 10-20 lbs for a family members upcoming wedding. States she got a lot of attention when she was younger for not eating. Expresses a lot of concerns related to current living situation at senior facility. States there is currently an investigation. Pt states she does not have much control over decisions that are being made for her and she does not like it.   Previous appt: States she has become established with new PCP, Dorinda Hill, MD. [facility] cardiologist and dermatologist last week. States A1c was 7.7 in Dec. Reports she wants to lose weight in 18 months to be about 10 lbs lighter or 1-2 sizes smaller for family event. States she feels better about herself when she is smaller.    Eating history: Length of time:  Previous treatments: none Goals for RD meetings: improve relationship with food   Mental health diagnosis: BED   Dietary assessment: A typical day consists of 3 meals and 0-1 snacks  Safe foods include: [place], lean beef, tofu, yogurt, chicken, venison, pineapple, popcorn, sugar-free cookies/ice cream Avoided foods include: none reported  24  hour recall:  B: 1 c cottage cheese + 1 c pineapple + 1 c oatmeal  S: L: cheddar and beef bake + 1 Tbs chicken + mixed vegetables + strawberry cobbler S: 2 chocolate chip cookies Supper (5 pm): french dip sub + vegetable soup  S:   Beverages: unsweetened iced tea (2 glasses), 2 c coffee (with 2 sweet n low), 1 oz  juice and 1/2 can diet pepis  Physical activity: 15 min chair exercises about 3 days/week  What Methods Do You Use To Control Your Weight (Compensatory behaviors)?  Binge  Estimated energy needs: 1600 kcal 180 g CHO 120 g pro 44 g fat  Nutrition Diagnosis: NB-1.5 Disordered eating pattern As related to binge eating disorder.  As evidenced by pt reported.  Intervention/Goals: Mainly listened. Discussed BS numbers being lower than previous visit. Discussed diet culture, challenging thought patterns related to weight loss, and what is considered "the right quantity".    Meal plan:    3 meals    1-2 snacks  Monitoring and Evaluation: Patient will follow up in 3 weeks.

## 2020-05-05 NOTE — Progress Notes (Signed)
Cardiology Office Note Date:  05/06/2020  Patient ID:  Kirsten Jensen, Kirsten Jensen Jan 11, 1932, MRN [id number] PCP:  Michael Boston, MD  Cardiologist/EP:  Dr. Caryl Comes    Chief Complaint: post hospital f/u, remote with Aflutter  History of Present Illness: Lorren Rossetti is a 86 y.o. female with history of CAD (PCI approx 5 years ago in [place]), AFib, flutter, tachy-brady w/PPM, HTN, DM, OSA (w/CPAP).  He comes in today to be seen for Dr. Caryl Comes.  Last seen by him 03/18/2020. Per that office visit note, "Undertook rapid atrial pacing with termination of her atrial flutter. We will follow for recurrence and then make a decision regarding antiarrhythmic therapy and/or catheter ablation of the AV node"  He was admitted to [facility] 04/05/20  w/CP, EKG showed rate controlled atrial flutter with LVH and RBBB. High-sensitivity troponin minimally elevated and flat at 22 >> 24 >> 22. Chest x-ray showed no acute findings. WBC 9.5, Hgb 11.6, Plts 371. Na 140, K 4.1, Glucose 157, Creatinine 1.05. COVID-19 negative Cardiology, Dr. Margaretann Loveless consulted, CP was felt atypical, planned for echo, noted with low normal EF 50% and RWMA in the distribution of perfusion defects seen on prior stress testing. Suggests no change over time.  - consider outpatient stress test, Continued on [place], was rate controlled, dilt/home meds continued Discharged 04/06/20  She feels well!  She is a resident at [facility] 2/2 worsening macular degeneration.  She feels like the care is slow and unpredictable, and feels like she is a good patient advocate for the other residents that are less able to verbalize or get help/attention.  She is a retired Education officer, museum, and feels like she is there for a reason and has been able to make some observations, recommendations and they have made some effective changes already in staffing and care at the facility.  The night she went to the hospital she had eaten dinner and once back to her room felt a central chest  pressure, she felt it was probably not her heart, more her breast bone, but it stayed a while and she was worried about being the whole night there with it, so went tot he hospital.  It seemed to self resolve and has not had it again.  She denies any overt palpitations.  She is infrequently aware of a irregular heart beat.  No dizzy spells, near syncope or syncope, no bleeding or signs of bleeding  In review of the week or 2 of SR that she had, she does not think she could tell the difference.  And in comparison to a month ago or longer, she feels the best now that she has in quite a long time with improving exertional capacity and endurance.    Device information BSCi dual chamber PPM implanted 09/14/2018     Past Medical History:  Diagnosis Date  . Atrial fibrillation ([place])   . Coronary artery disease    stenting x 2  . Diabetes ([place])   . Dyslipidemia 04/05/2020  . Hypertension   . Morbid obesity ([place])   . Pacemaker   . Paroxysmal atrial fibrillation ([place]) 11/06/2019  . Secondary hypercoagulable state ([place]) 11/06/2019  . T2DM (type 2 diabetes mellitus) ([place]) 04/05/2020  . Tachycardia-bradycardia syndrome ([place]) 03/17/2020    No past surgical history on file.  Current Outpatient Medications  Medication Sig Dispense Refill  . acetaminophen (TYLENOL) 500 MG tablet Take 500 mg by mouth 2 (two) times daily as needed for mild pain.     Marland Kitchen  acetaminophen-codeine (TYLENOL #4) 300-60 MG tablet Take 1 tablet by mouth 3 (three) times daily as needed for pain.     Marland Kitchen apixaban (ELIQUIS) 5 MG TABS tablet Take 5 mg by mouth 2 (two) times daily.    Marland Kitchen buPROPion (WELLBUTRIN) 100 MG tablet Take 100 mg by mouth daily.    . Cholecalciferol (VITAMIN D-3) 25 MCG (1000 UT) CAPS Take 1 capsule by mouth daily.    . clopidogrel (PLAVIX) 75 MG tablet Take 75 mg by mouth daily.    Marland Kitchen diltiazem (CARDIZEM CD) 120 MG 24 hr capsule Take 1 capsule (120 mg total) by mouth daily. 30 capsule 3  . gemfibrozil (LOPID) 600  MG tablet Take 600 mg by mouth 2 (two) times daily before a meal.    . glimepiride (AMARYL) 2 MG tablet Take 2 mg by mouth daily with breakfast.    . ketoconazole (NIZORAL) 2 % shampoo Apply 1 application topically daily as needed for irritation. Let sit for 3 minutes before rinsing.    Marland Kitchen levothyroxine (SYNTHROID) 50 MCG tablet Take 50 mcg by mouth daily before breakfast.    . magnesium oxide (MAG-OX) 400 MG tablet Take 1 tablet (400 mg total) by mouth daily. 30 tablet 3  . metFORMIN (GLUCOPHAGE-XR) 500 MG 24 hr tablet Take 1,000 mg by mouth 2 (two) times daily.    . Multiple Vitamins-Minerals (OCUVITE ADULT 50+ PO) Take 1 capsule by mouth daily.    . pravastatin (PRAVACHOL) 80 MG tablet Take 80 mg by mouth daily.    . ramipril (ALTACE) 5 MG capsule Take 5 mg by mouth daily.    Marland Kitchen torsemide (DEMADEX) 20 MG tablet Take 1 tablet (20 mg total) by mouth daily. 90 tablet 3  . traMADol (ULTRAM) 50 MG tablet Take 50 mg by mouth every 8 (eight) hours as needed for moderate pain.     . vitamin B-12 (CYANOCOBALAMIN) 1000 MCG tablet Take 1,000 mcg by mouth daily.     No current facility-administered medications for this visit.    Allergies:   Patient has no known allergies.   Social History:  The patient  reports that she has never smoked. She has never used smokeless tobacco. She reports previous alcohol use. She reports that she does not use drugs.   Family History:  The patient's family history includes CAD in her father; Hypertension in her mother; Stroke in her mother.  ROS:  Please see the history of present illness.  All other systems are reviewed and otherwise negative.   PHYSICAL EXAM:  VS:  BP (!) 142/68   Pulse 100   Ht 5\' 7"  (1.702 m)   Wt 299 lb (135.6 kg)   LMP  (LMP Unknown)   SpO2 97%   BMI 46.83 kg/m  BMI: Body mass index is 46.83 kg/m. Well nourished, well developed, in no acute distress  HEENT: normocephalic, atraumatic  Neck: no JVD, carotid bruits or masses Cardiac:  irreg-irreg ; no significant murmurs, no rubs, or gallops Lungs:  CTA b/l, no wheezing, rhonchi or rales  Abd: soft, nontender, obese MS: no deformity, age appropriate atrophy Ext: no edema  Skin: warm and dry, no rash Neuro:  No gross deficits appreciated Psych: euthymic mood, full affect  PPM site is stable, no tethering or discomfort   EKG:  Not done today  PPM interrogation done today and reviewed by myself:  She is in Aflutter, V rate 90's-100 Battery and lead measurements are good Looks like from 4/6 maintained 10-14 days  in SR and has been in AFlutter since then AF rates generally 90's   04/06/2020: TTE IMPRESSIONS  1. Left ventricular ejection fraction, by estimation, is 50%. The left  ventricle has low normal function. Left ventricular endocardial border not  optimally defined to evaluate regional wall motion. The left ventricular  internal cavity size was mildly  dilated. Left ventricular diastolic parameters are indeterminate.  2. Right ventricular systolic function is normal. The right ventricular  size is mildly enlarged. There is mildly elevated pulmonary artery  systolic pressure. The estimated right ventricular systolic pressure is  39.0 mmHg.  3. Left atrial size was mildly dilated.  4. Right atrial size was mildly dilated.  5. The mitral valve is degenerative. Mild mitral valve regurgitation. No  evidence of mitral stenosis.  6. The aortic valve is normal in structure. Aortic valve regurgitation is  not visualized. No aortic stenosis is present.  7. The inferior vena cava is normal in size with greater than 50%  respiratory variability, suggesting right atrial pressure of 3 mmHg.     Recent Labs: 02/05/2020: TSH 2.077 04/04/2020: Hemoglobin 11.6; Platelets 371 04/05/2020: Magnesium 1.3 04/06/2020: ALT 13; BUN 23; Creatinine, Ser 1.05; Potassium 4.6; Sodium 137  No results found for requested labs within last 8760 hours.   CrCl cannot be calculated  (Patient's most recent lab result is older than the maximum 21 days allowed.).   Wt Readings from Last 3 Encounters:  05/06/20 299 lb (135.6 kg)  04/06/20 292 lb 8 oz (132.7 kg)  03/18/20 (!) 305 lb (138.3 kg)     Other studies reviewed: Additional studies/records reviewed today include: summarized above  ASSESSMENT AND PLAN:  1. CAD      Atypical CP, not recurrent      On Plavix, statin and lopid, labs monitored via her PMD  2. PPM     Battery and lead measurements are good     No programming changes made  3. Persistent AFib, flutter     CHA2DS2Vasc is 6, on Eliquis, appropriately dosed     Had ERAF after about 10 days or so     She feels well  Given no symptoms with her AFlutter (that looks atypical by her EKGs though difficult to see) will not add AAD or pursue rhythm control Rates look fair, 90's generally, follow  4. HTN     No changes today  5. HLD     Monitored via her PMD  6. Mild reduction in LVEF     EF 50% by her last echo     No symptoms or exam findings of volume OL     Discussed perhaps changing her dilt to a BB though she feels the best she has now then she has in quite a while.       For now, no changes, if we need better rate control in the future can add on   Disposition: F/u with me in 2-3 mo, sooner if needed.  Re-evaluate for any symptoms, rates.  Current medicines are reviewed at length with the patient today.  The patient did not have any concerns regarding medicines.  Norma Fredrickson, PA-C 05/06/2020 5:40 PM     [facility] [address] [place] [address] [phone] (office)  [phone] (fax)

## 2020-05-06 ENCOUNTER — Ambulatory Visit (INDEPENDENT_AMBULATORY_CARE_PROVIDER_SITE_OTHER): Payer: Medicare Other | Admitting: Physician Assistant

## 2020-05-06 ENCOUNTER — Other Ambulatory Visit: Payer: Self-pay

## 2020-05-06 VITALS — BP 142/68 | HR 100 | Ht 67.0 in | Wt 299.0 lb

## 2020-05-06 DIAGNOSIS — I2510 Atherosclerotic heart disease of native coronary artery without angina pectoris: Secondary | ICD-10-CM

## 2020-05-06 DIAGNOSIS — I10 Essential (primary) hypertension: Secondary | ICD-10-CM

## 2020-05-06 DIAGNOSIS — I484 Atypical atrial flutter: Secondary | ICD-10-CM | POA: Diagnosis not present

## 2020-05-06 DIAGNOSIS — Z950 Presence of cardiac pacemaker: Secondary | ICD-10-CM | POA: Diagnosis not present

## 2020-05-06 DIAGNOSIS — E785 Hyperlipidemia, unspecified: Secondary | ICD-10-CM

## 2020-05-06 NOTE — Patient Instructions (Signed)
Medication Instructions:   Your physician recommends that you continue on your current medications as directed. Please refer to the Current Medication list given to you today.  *If you need a refill on your cardiac medications before your next appointment, please call your pharmacy*   Lab Work: NONE ORDERED  TODAY   If you have labs (blood work) drawn today and your tests are completely normal, you will receive your results only by: Marland Kitchen MyChart Message (if you have MyChart) OR . A paper copy in the mail If you have any lab test that is abnormal or we need to change your treatment, we will call you to review the results.   Testing/Procedures: NONE ORDERED  TODAY   Follow-Up: At [facility], you and your health needs are our priority.  As part of our continuing mission to provide you with exceptional heart care, we have created designated Provider Care Teams.  These Care Teams include your primary Cardiologist (physician) and Advanced Practice Providers (APPs -  Physician Assistants and Nurse Practitioners) who all work together to provide you with the care you need, when you need it.  We recommend signing up for the patient portal called "MyChart".  Sign up information is provided on this After Visit Summary.  MyChart is used to connect with patients for Virtual Visits (Telemedicine).  Patients are able to view lab/test results, encounter notes, upcoming appointments, etc.  Non-urgent messages can be sent to your provider as well.   To learn more about what you can do with MyChart, go to [email or web address].    Your next appointment:   2 -3  month(s)  The format for your next appointment:   In Person  Provider:   You may see  following Advanced Practice Providers on your designated Care Team: Francis Dowse, [place]   Other Instructions

## 2020-05-20 ENCOUNTER — Encounter: Payer: Self-pay | Admitting: Registered"

## 2020-05-20 ENCOUNTER — Encounter: Payer: Medicare Other | Attending: Internal Medicine | Admitting: Registered"

## 2020-05-20 ENCOUNTER — Other Ambulatory Visit: Payer: Self-pay

## 2020-05-20 DIAGNOSIS — E119 Type 2 diabetes mellitus without complications: Secondary | ICD-10-CM | POA: Diagnosis not present

## 2020-05-20 NOTE — Progress Notes (Signed)
  Appointment start time: 2:11  Appointment end time: 3:05  Patient was seen on 6/8//2021 for nutrition counseling pertaining to disordered eating  Primary care provider: Florentina Jenny, MD Therapist: Crecencio Mc; weekly appts  ROI: 05/20/2020 Any other medical team members: none  Assessment  States she ate eggplant parmesan + ziti + garlic bread + small bowl of tomato basil soup the other night when eating with family. States she ate 1/2 of the meal and took the other half home. States she feels like she should have only eaten 1/3 and saved 2/3 of it. States she ate salad and chicken it was bad. States she weighs 300 lbs and needs to weigh less. Thinks about the pressure it would take off her knees related to weight loss. States she listens to OA 24/7, goes to sleep listening to it and wakes up to it on her phone.   Pt reports she has BS checked 2x/day: FBS (80-124) and at 4pm (74-170). States she has appt at [facility]. Planning to get there 3 days/week for water activities.   Previous appt: States she has become established with new PCP, Dorinda Hill, MD. [facility] cardiologist and dermatologist last week. States A1c was 7.7 in Dec. Reports she wants to lose weight in 18 months to be about 10 lbs lighter or 1-2 sizes smaller for family event. States she feels better about herself when she is smaller.    Eating history: Length of time:  Previous treatments: none Goals for RD meetings: improve relationship with food   Mental health diagnosis: BED   Dietary assessment: A typical day consists of 3 meals and 0-1 snacks  Safe foods include: [place], lean beef, tofu, yogurt, chicken, venison, pineapple, popcorn, sugar-free cookies/ice cream Avoided foods include: none reported  24 hour recall:  B: 2 chinese donuts + 1/2 c cottage cheese  S: L: 1/3 eggplant + garlic sauce + 1/2 soup S: Supper (5 pm): chinese takeout - 2/3 eggplant + brown rice + 1/2 soup + egg roll + 3 chinese donuts S:    Beverages: unsweetened iced tea (2 glasses), 2 c coffee (with 2 sweet n low), 1 oz  juice and 1/2 can diet pepis  Physical activity: 15 min chair exercises about 3 days/week  What Methods Do You Use To Control Your Weight (Compensatory behaviors)?  Binge  Estimated energy needs: 1600 kcal 180 g CHO 120 g pro 44 g fat  Nutrition Diagnosis: NB-1.5 Disordered eating pattern As related to binge eating disorder.  As evidenced by pt reported.  Intervention/Goals: Mainly listened. Discussed BS numbers being lower than previous visit. Discussed challenging thought patterns related to weight loss.   Meal plan:    3 meals    1-2 snacks  Monitoring and Evaluation: Patient will follow up in 4 weeks.

## 2020-06-10 ENCOUNTER — Ambulatory Visit (INDEPENDENT_AMBULATORY_CARE_PROVIDER_SITE_OTHER): Payer: Medicare Other | Admitting: *Deleted

## 2020-06-10 DIAGNOSIS — I495 Sick sinus syndrome: Secondary | ICD-10-CM | POA: Diagnosis not present

## 2020-06-13 LAB — CUP PACEART REMOTE DEVICE CHECK
Battery Remaining Longevity: 60 mo
Battery Remaining Percentage: 96 %
Brady Statistic RA Percent Paced: 0 %
Brady Statistic RV Percent Paced: 6 %
Date Time Interrogation Session: 20210702040200
Implantable Lead Implant Date: 20191003
Implantable Lead Implant Date: 20191003
Implantable Lead Location: 753859
Implantable Lead Location: 753860
Implantable Lead Model: 7741
Implantable Lead Model: 7742
Implantable Lead Serial Number: 1047491
Implantable Lead Serial Number: 1074664
Implantable Pulse Generator Implant Date: 20191003
Lead Channel Impedance Value: 720 Ohm
Lead Channel Impedance Value: 745 Ohm
Lead Channel Pacing Threshold Amplitude: 1 V
Lead Channel Pacing Threshold Pulse Width: 0.4 ms
Lead Channel Setting Pacing Amplitude: 2 V
Lead Channel Setting Pacing Amplitude: 5 V
Lead Channel Setting Pacing Pulse Width: 0.4 ms
Lead Channel Setting Sensing Sensitivity: 2.5 mV
Pulse Gen Serial Number: 444544

## 2020-06-13 NOTE — Progress Notes (Signed)
Remote pacemaker transmission.   

## 2020-06-18 ENCOUNTER — Ambulatory Visit: Payer: Medicare Other | Admitting: Registered"

## 2020-07-14 LAB — CUP PACEART INCLINIC DEVICE CHECK
Brady Statistic RA Percent Paced: 13 %
Brady Statistic RV Percent Paced: 7 %
Date Time Interrogation Session: 20210406103402
Implantable Lead Implant Date: 20191003
Implantable Lead Implant Date: 20191003
Implantable Lead Location: 753859
Implantable Lead Location: 753860
Implantable Lead Model: 7741
Implantable Lead Model: 7742
Implantable Lead Serial Number: 1047491
Implantable Lead Serial Number: 1074664
Implantable Pulse Generator Implant Date: 20191003
Lead Channel Pacing Threshold Amplitude: 0.7 V
Lead Channel Pacing Threshold Amplitude: 1 V
Lead Channel Pacing Threshold Pulse Width: 0.4 ms
Lead Channel Pacing Threshold Pulse Width: 0.4 ms
Lead Channel Sensing Intrinsic Amplitude: 2.9 mV
Lead Channel Sensing Intrinsic Amplitude: 5.9 mV
Pulse Gen Serial Number: 444544

## 2020-07-22 ENCOUNTER — Telehealth: Payer: Self-pay | Admitting: Internal Medicine

## 2020-07-22 ENCOUNTER — Telehealth: Payer: Self-pay

## 2020-07-22 NOTE — Telephone Encounter (Signed)
Pt left a message on voicemail that was not clear. I LMOVM for the pt to give me a call back.

## 2020-07-22 NOTE — Telephone Encounter (Signed)
Pt called afib clinic demanding to speak to Clint Clide Cliff) Charlean Merl, PA-C or Sebastian Ache, NP regarding cardiac clearance for upcoming dental procedure. She states "she was told she could call the office anytime for anything and she's calling now." I instructed the patient that Clide Cliff would not be the person to grant her cardiac clearance that would either need to come from her general cardiologist or from her EP doctor. I instructed the patient that I see that she had already spoken to someone and been informed the same information and that she would need to have a cardiac clearance appointment as instructed.

## 2020-07-22 NOTE — Telephone Encounter (Signed)
   [facility] Pre-operative Risk Assessment    HEARTCARE STAFF: - Please ensure there is not already an duplicate clearance open for this procedure. - Under Visit Info/Reason for Call, type in Other and utilize the format Clearance MM/DD/YY or Clearance TBD. Do not use dashes or single digits. - If request is for dental extraction, please clarify the # of teeth to be extracted.  Request for surgical clearance:  1. What type of surgery is being performed? One tooth extraction  2. When is this surgery scheduled? 07/29/20  3. What type of clearance is required (medical clearance vs. Pharmacy clearance to hold med vs. Both)? Both  4. Are there any medications that need to be held prior to surgery and how long? Up to Cardiology  5. Practice name and name of physician performing surgery? Dr. Othelia Pulling, [place]   6. What is the office phone number? [phone]   7.   What is the office fax number? Pt did not know  8.   Anesthesia type (None, local, MAC, general) ? Pt did not know   Patient needs clearance request to be faxed to her [place] ([place]) phone: [phone] Fax: [phone]  Johnna Acosta 07/22/2020, 4:39 PM  _________________________________________________________________   (provider comments below)

## 2020-07-23 ENCOUNTER — Telehealth: Payer: Self-pay

## 2020-07-23 NOTE — Telephone Encounter (Signed)
Patient returning call.

## 2020-07-23 NOTE — Telephone Encounter (Signed)
The pt is having a tooth extraction and needs to know what medication she can and can not take. She is living at the [facility] they will need a fax stating what she can take and can not take before and after the tooth extraction. She would like for [name] nurse to call her as soon as she can. Her phone number is [phone].

## 2020-07-23 NOTE — Telephone Encounter (Signed)
   Primary Cardiologist: Dr Graciela Husbands  Chart reviewed as part of pre-operative protocol coverage.   Simple dental extractions are considered low risk procedures per guidelines and generally do not require any specific cardiac clearance. It is also generally accepted that for simple extractions and dental cleanings, there is no need to interrupt blood thinner therapy.  SBE prophylaxis is not required for the patient.  I will route this recommendation to the requesting party via Epic fax function and remove from pre-op pool.  Please call with questions.  Corine Shelter, PA-C 07/23/2020, 10:31 AM

## 2020-07-23 NOTE — Telephone Encounter (Signed)
Attempted phone call to pt.  Left voicemail message for pt to contact RN at [phone]. 

## 2020-07-23 NOTE — Telephone Encounter (Signed)
Dental clearance sent to [facility] at the fax number provided.  Corine Shelter PA-C 07/23/2020 10:35 AM

## 2020-07-23 NOTE — Telephone Encounter (Signed)
Opening error 

## 2020-07-23 NOTE — Telephone Encounter (Signed)
Left voicemail message to contact RN at [phone]. °

## 2020-07-29 ENCOUNTER — Emergency Department (HOSPITAL_COMMUNITY)
Admission: EM | Admit: 2020-07-29 | Discharge: 2020-07-29 | Disposition: A | Discharge status: Home / Self Care | Payer: Medicare Other | Attending: Emergency Medicine | Admitting: Emergency Medicine

## 2020-07-29 ENCOUNTER — Encounter (HOSPITAL_COMMUNITY): Payer: Self-pay | Admitting: Emergency Medicine

## 2020-07-29 ENCOUNTER — Telehealth: Payer: Self-pay | Admitting: Internal Medicine

## 2020-07-29 DIAGNOSIS — Z98818 Other dental procedure status: Secondary | ICD-10-CM | POA: Insufficient documentation

## 2020-07-29 DIAGNOSIS — K068 Other specified disorders of gingiva and edentulous alveolar ridge: Secondary | ICD-10-CM | POA: Diagnosis not present

## 2020-07-29 DIAGNOSIS — Z5321 Procedure and treatment not carried out due to patient leaving prior to being seen by health care provider: Secondary | ICD-10-CM | POA: Insufficient documentation

## 2020-07-29 DIAGNOSIS — K0889 Other specified disorders of teeth and supporting structures: Secondary | ICD-10-CM | POA: Insufficient documentation

## 2020-07-29 NOTE — ED Triage Notes (Signed)
Patient reports she had tooth extracted this AM. Has reported continuous bleeding from site. Patient takes Eliquis.

## 2020-07-29 NOTE — Telephone Encounter (Signed)
Follow up   Kirsten Jensen is calling back to follow up, he said pt is still bleeding and needs to be seen today.

## 2020-07-29 NOTE — Telephone Encounter (Signed)
Dental clearance has been completed and sent to [facility].

## 2020-07-29 NOTE — ED Notes (Signed)
LWBS 

## 2020-07-29 NOTE — Telephone Encounter (Signed)
Spoke with pt who states she continues to have bleeding from her tooth extraction today.  Pt advised per Tereso Newcomer, PA-C and Francis Dowse PA-C pt's dentist needs to address the bleeding as this is a dental procedure issue and not cardiology.  Pt advised per Lorin Picket she may hold her Eliquis tonight but will need to restart it in the am.  Pt will need to call her dentist emergency number for further assistance.  Pt verbalizes understanding and agrees with current plan.

## 2020-07-29 NOTE — Telephone Encounter (Signed)
Aurther Loft is calling requesting a nurse reach out to the patient's living facility due to the patient having a tooth exstraction at 12:30 today and still bleeding. She states the pt advised her she informed the living facility and asked them to call to schedule an appt, but they are not doing anything for her. The dental office closes at 4:00 PM, but Aurther Loft will be available tomorrow if there are any questions she can answer. Please advise.

## 2020-07-29 NOTE — Telephone Encounter (Signed)
Bleeding should be addressed by dentist that did her procedure. If needed, Eliquis can be held tonight but should be resumed tomorrow.  Tereso Newcomer, PA-C    07/29/2020 4:15 PM

## 2020-09-09 ENCOUNTER — Ambulatory Visit (INDEPENDENT_AMBULATORY_CARE_PROVIDER_SITE_OTHER): Payer: Medicare Other | Admitting: Emergency Medicine

## 2020-09-09 DIAGNOSIS — I495 Sick sinus syndrome: Secondary | ICD-10-CM

## 2020-09-09 LAB — CUP PACEART REMOTE DEVICE CHECK
Battery Remaining Longevity: 54 mo
Battery Remaining Percentage: 85 %
Brady Statistic RA Percent Paced: 0 %
Brady Statistic RV Percent Paced: 7 %
Date Time Interrogation Session: 20210928012400
Implantable Lead Implant Date: 20191003
Implantable Lead Implant Date: 20191003
Implantable Lead Location: 753859
Implantable Lead Location: 753860
Implantable Lead Model: 7741
Implantable Lead Model: 7742
Implantable Lead Serial Number: 1047491
Implantable Lead Serial Number: 1074664
Implantable Pulse Generator Implant Date: 20191003
Lead Channel Impedance Value: 699 Ohm
Lead Channel Impedance Value: 741 Ohm
Lead Channel Pacing Threshold Amplitude: 1.1 V
Lead Channel Pacing Threshold Pulse Width: 0.4 ms
Lead Channel Setting Pacing Amplitude: 2 V
Lead Channel Setting Pacing Amplitude: 5 V
Lead Channel Setting Pacing Pulse Width: 0.4 ms
Lead Channel Setting Sensing Sensitivity: 2.5 mV
Pulse Gen Serial Number: 444544

## 2020-09-11 ENCOUNTER — Encounter (INDEPENDENT_AMBULATORY_CARE_PROVIDER_SITE_OTHER): Payer: Self-pay | Admitting: Ophthalmology

## 2020-09-11 ENCOUNTER — Ambulatory Visit (INDEPENDENT_AMBULATORY_CARE_PROVIDER_SITE_OTHER): Payer: Medicare Other | Admitting: Ophthalmology

## 2020-09-11 ENCOUNTER — Other Ambulatory Visit: Payer: Self-pay

## 2020-09-11 DIAGNOSIS — E119 Type 2 diabetes mellitus without complications: Secondary | ICD-10-CM | POA: Insufficient documentation

## 2020-09-11 DIAGNOSIS — H353233 Exudative age-related macular degeneration, bilateral, with inactive scar: Secondary | ICD-10-CM | POA: Insufficient documentation

## 2020-09-11 DIAGNOSIS — H353134 Nonexudative age-related macular degeneration, bilateral, advanced atrophic with subfoveal involvement: Secondary | ICD-10-CM | POA: Insufficient documentation

## 2020-09-11 NOTE — Assessment & Plan Note (Signed)

## 2020-09-11 NOTE — Progress Notes (Signed)
09/11/2020     CHIEF COMPLAINT Patient presents for Retina Follow Up   HISTORY OF PRESENT ILLNESS: Kirsten Jensen is a 86 y.o. female who presents to the clinic today for:   HPI    Retina Follow Up    This started 1 year ago.  Severity is mild.  Duration of 1 year.  Since onset it is gradually worsening.          Comments    1 Year F/U OU  Pt c/o gradually decreasing [facility] OU. Pt c/o difficulty reading numbers up close and seeing people far away.       Last edited by Ileana Roup, COA on 09/11/2020  1:57 PM. (History)      Referring physician: Melida Quitter, MD [address] [place],  [place] [address]  HISTORICAL INFORMATION:   Selected notes from the MEDICAL RECORD NUMBER    Lab Results  Component Value Date   HGBA1C 8.0 (H) 04/05/2020     CURRENT MEDICATIONS: No current outpatient medications on file. (Ophthalmic Drugs)   No current facility-administered medications for this visit. (Ophthalmic Drugs)   Current Outpatient Medications (Other)  Medication Sig  . acetaminophen (TYLENOL) 500 MG tablet Take 500 mg by mouth 2 (two) times daily as needed for mild pain.   Marland Kitchen acetaminophen-codeine (TYLENOL #4) 300-60 MG tablet Take 1 tablet by mouth 3 (three) times daily as needed for pain.   Marland Kitchen apixaban (ELIQUIS) 5 MG TABS tablet Take 5 mg by mouth 2 (two) times daily.  Marland Kitchen buPROPion (WELLBUTRIN) 100 MG tablet Take 100 mg by mouth daily.  . Cholecalciferol (VITAMIN D-3) 25 MCG (1000 UT) CAPS Take 1 capsule by mouth daily.  . clopidogrel (PLAVIX) 75 MG tablet Take 75 mg by mouth daily.  Marland Kitchen diltiazem (CARDIZEM CD) 120 MG 24 hr capsule Take 1 capsule (120 mg total) by mouth daily.  Marland Kitchen gemfibrozil (LOPID) 600 MG tablet Take 600 mg by mouth 2 (two) times daily before a meal.  . glimepiride (AMARYL) 2 MG tablet Take 2 mg by mouth daily with breakfast.  . ketoconazole (NIZORAL) 2 % shampoo Apply 1 application topically daily as needed for irritation. Let sit for 3 minutes before  rinsing.  Marland Kitchen levothyroxine (SYNTHROID) 50 MCG tablet Take 50 mcg by mouth daily before breakfast.  . magnesium oxide (MAG-OX) 400 MG tablet Take 1 tablet (400 mg total) by mouth daily.  . metFORMIN (GLUCOPHAGE-XR) 500 MG 24 hr tablet Take 1,000 mg by mouth 2 (two) times daily.  . Multiple Vitamins-Minerals (OCUVITE ADULT 50+ PO) Take 1 capsule by mouth daily.  . pravastatin (PRAVACHOL) 80 MG tablet Take 80 mg by mouth daily.  . ramipril (ALTACE) 5 MG capsule Take 5 mg by mouth daily.  Marland Kitchen torsemide (DEMADEX) 20 MG tablet Take 1 tablet (20 mg total) by mouth daily.  . traMADol (ULTRAM) 50 MG tablet Take 50 mg by mouth every 8 (eight) hours as needed for moderate pain.   . vitamin B-12 (CYANOCOBALAMIN) 1000 MCG tablet Take 1,000 mcg by mouth daily.   No current facility-administered medications for this visit. (Other)      REVIEW OF SYSTEMS:    ALLERGIES No Known Allergies  PAST MEDICAL HISTORY Past Medical History:  Diagnosis Date  . Atrial fibrillation (HCC)   . Coronary artery disease    stenting x 2  . Diabetes (HCC)   . Dyslipidemia 04/05/2020  . Hypertension   . Morbid obesity (HCC)   . Pacemaker   .  Paroxysmal atrial fibrillation (HCC) 11/06/2019  . Secondary hypercoagulable state (HCC) 11/06/2019  . T2DM (type 2 diabetes mellitus) (HCC) 04/05/2020  . 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 No pertinent surgical history.  FAMILY HISTORY Family History  Problem Relation Age of Onset  . Hypertension Mother   . Stroke Mother        died at age 84  . CAD Father        MI at age of 671    SOCIAL HISTORY Social History   Tobacco Use  . Smoking status: Never Smoker  . Smokeless tobacco: Never Used  Vaping Use  . Vaping Use: Never used  Substance Use Topics  . Alcohol use: Not Currently  . Drug use: Never         OPHTHALMIC EXAM: Base Eye Exam    Visual Acuity (ETDRS)      Right Left   Dist [place] CF @ 1' 20/400   Dist ph  NI NI        Tonometry (Tonopen, 1:58 PM)      Right Left   Pressure 13 12       Pupils      Pupils Dark Light Shape React APD   Right PERRL 4 3 Round Brisk None   Left PERRL 4 3 Round Brisk None       Visual Fields (Counting fingers)      Left Right   Restrictions Total inferior nasal deficiency Total superior temporal, superior nasal deficiencies       Extraocular Movement      Right Left    Full Full       Neuro/Psych    Oriented x3: Yes   Mood/Affect: Normal       Dilation    Both eyes: 1.0% Mydriacyl, 2.5% Phenylephrine @ 2:02 PM        Slit Lamp and Fundus Exam    External Exam      Right Left   External Normal Normal       Slit Lamp Exam      Right Left   Lids/Lashes Normal Normal   Conjunctiva/Sclera White and quiet White and quiet   Cornea Clear Clear   Anterior Chamber Deep and quiet Deep and quiet   Iris Round and reactive Round and reactive   Lens Posterior chamber intraocular lens Posterior chamber intraocular lens   Anterior Vitreous Normal Normal       Fundus Exam      Right Left   Posterior Vitreous Posterior vitreous detachment Posterior vitreous detachment   Disc Peripapillary atrophy, extensive region of nonperfusion, bare white sclera approximately 30 disc areas in size encompassing the macula superior to the nerve inferior and nasal to the nerve Peripapillary atrophy, extensive region of nonperfusion, bare white sclera approximately 30 disc areas in size encompassing the macula superior to the nerve inferior and nasal to the nerve   C/D Ratio 0.45 0.3   Macula Geographic atrophy Geographic atrophy   Vessels Normal Normal   Periphery Normal Normal          IMAGING AND PROCEDURES  Imaging and Procedures for 09/11/20  Color Fundus Photography Optos - OU - Both Eyes       Right Eye Progression has no prior data. Disc findings include increased cup to disc ratio. Macula : geographic atrophy. Vessels : normal observations. Periphery :  normal observations.   Left Eye Progression has no prior data. Disc findings include normal observations. Macula :  geographic atrophy. Vessels : normal observations. Periphery : normal observations.   Notes No signs of active CNVM OU                ASSESSMENT/PLAN:  Advanced nonexudative age-related macular degeneration of both eyes with subfoveal involvement The nature of dry age related macular degeneration was discussed with the patient as well as its possible conversion to wet. The results of the AREDS 2 study was discussed with the patient. A diet rich in dark leafy green vegetables was advised and specific recommendations were made regarding supplements with AREDS 2 formulation . Control of hypertension and serum cholesterol may slow the disease. Smoking cessation is mandatory to slow the disease and diminish the risk of progressing to wet age related macular degeneration. The patient was instructed in the use of an Amsler Grid and was told to return immediately for any changes in the Grid. Stressed to the patient do not rub eyes  Diabetes mellitus without complication (HCC) The patient has diabetes without any evidence of retinopathy. The patient advised to maintain good blood glucose control, excellent blood pressure control, and favorable levels of cholesterol, low density lipoprotein, and high density lipoproteins. Follow up in 1 year was recommended. Explained that fluctuations in visual acuity , or "out of focus", may result from large variations of blood sugar control.      ICD-10-CM   1. Advanced nonexudative age-related macular degeneration of both eyes with subfoveal involvement  H35.3134 Color Fundus Photography Optos - OU - Both Eyes  2. Exudative age-related macular degeneration of both eyes with inactive scar (HCC)  H35.3233 Color Fundus Photography Optos - OU - Both Eyes  3. Diabetes mellitus without complication (HCC)  E11.9 Color Fundus Photography Optos - OU - Both  Eyes    1.  2.  3.  Ophthalmic Meds Ordered this visit:  No orders of the defined types were placed in this encounter.      Return in about 2 years (around 09/11/2022) for DILATE OU, COLOR FP.  There are no Patient Instructions on file for this visit.   Explained the diagnoses, plan, and follow up with the patient and they expressed understanding.  Patient expressed understanding of the importance of proper follow up care.   Alford Highland Kivon Aprea M.D. Diseases & Surgery of the Retina and Vitreous [facility] 09/11/20     Abbreviations: M myopia (nearsighted); A astigmatism; H hyperopia (farsighted); P presbyopia; Mrx spectacle prescription;  CTL contact lenses; OD right eye; OS left eye; OU both eyes  XT exotropia; ET esotropia; PEK punctate epithelial keratitis; PEE punctate epithelial erosions; DES dry eye syndrome; MGD meibomian gland dysfunction; ATs artificial tears; PFAT's preservative free artificial tears; NSC nuclear sclerotic cataract; PSC posterior subcapsular cataract; ERM epi-retinal membrane; PVD posterior vitreous detachment; RD retinal detachment; DM diabetes mellitus; DR diabetic retinopathy; NPDR non-proliferative diabetic retinopathy; PDR proliferative diabetic retinopathy; CSME clinically significant macular edema; DME diabetic macular edema; dbh dot blot hemorrhages; CWS cotton wool spot; POAG primary open angle glaucoma; C/D cup-to-disc ratio; HVF humphrey visual field; GVF goldmann visual field; OCT optical coherence tomography; IOP intraocular pressure; BRVO Branch retinal vein occlusion; CRVO central retinal vein occlusion; CRAO central retinal artery occlusion; BRAO branch retinal artery occlusion; RT retinal tear; SB scleral buckle; PPV pars plana vitrectomy; VH Vitreous hemorrhage; PRP panretinal laser photocoagulation; IVK intravitreal kenalog; VMT vitreomacular traction; MH Macular hole;  NVD neovascularization of the disc; NVE neovascularization  elsewhere; AREDS age related eye disease study;  ARMD age related macular degeneration; POAG primary open angle glaucoma; EBMD epithelial/anterior basement membrane dystrophy; ACIOL anterior chamber intraocular lens; IOL intraocular lens; PCIOL posterior chamber intraocular lens; Phaco/IOL phacoemulsification with intraocular lens placement; PRK photorefractive keratectomy; LASIK laser assisted in situ keratomileusis; HTN hypertension; DM diabetes mellitus; COPD chronic obstructive pulmonary disease   09/11/2020     CHIEF COMPLAINT Patient presents for Retina Follow Up   HISTORY OF PRESENT ILLNESS: Kirsten Jensen is a 86 y.o. female who presents to the clinic today for:   HPI    Retina Follow Up    This started 1 year ago.  Severity is mild.  Duration of 1 year.  Since onset it is gradually worsening.          Comments    1 Year F/U OU  Pt c/o gradually decreasing VA OU. Pt c/o difficulty reading numbers up close and seeing people far away.       Last edited by Ileana Roup, COA on 09/11/2020  1:57 PM. (History)      Referring physician: Melida Quitter, MD [address] [place],  [place] [address]  HISTORICAL INFORMATION:   Selected notes from the MEDICAL RECORD NUMBER    Lab Results  Component Value Date   HGBA1C 8.0 (H) 04/05/2020     CURRENT MEDICATIONS: No current outpatient medications on file. (Ophthalmic Drugs)   No current facility-administered medications for this visit. (Ophthalmic Drugs)   Current Outpatient Medications (Other)  Medication Sig  . acetaminophen (TYLENOL) 500 MG tablet Take 500 mg by mouth 2 (two) times daily as needed for mild pain.   Marland Kitchen acetaminophen-codeine (TYLENOL #4) 300-60 MG tablet Take 1 tablet by mouth 3 (three) times daily as needed for pain.   Marland Kitchen apixaban (ELIQUIS) 5 MG TABS tablet Take 5 mg by mouth 2 (two) times daily.  Marland Kitchen buPROPion (WELLBUTRIN) 100 MG tablet Take 100 mg by mouth daily.  . Cholecalciferol (VITAMIN D-3) 25 MCG (1000 UT)  CAPS Take 1 capsule by mouth daily.  . clopidogrel (PLAVIX) 75 MG tablet Take 75 mg by mouth daily.  Marland Kitchen diltiazem (CARDIZEM CD) 120 MG 24 hr capsule Take 1 capsule (120 mg total) by mouth daily.  Marland Kitchen gemfibrozil (LOPID) 600 MG tablet Take 600 mg by mouth 2 (two) times daily before a meal.  . glimepiride (AMARYL) 2 MG tablet Take 2 mg by mouth daily with breakfast.  . ketoconazole (NIZORAL) 2 % shampoo Apply 1 application topically daily as needed for irritation. Let sit for 3 minutes before rinsing.  Marland Kitchen levothyroxine (SYNTHROID) 50 MCG tablet Take 50 mcg by mouth daily before breakfast.  . magnesium oxide (MAG-OX) 400 MG tablet Take 1 tablet (400 mg total) by mouth daily.  . metFORMIN (GLUCOPHAGE-XR) 500 MG 24 hr tablet Take 1,000 mg by mouth 2 (two) times daily.  . Multiple Vitamins-Minerals (OCUVITE ADULT 50+ PO) Take 1 capsule by mouth daily.  . pravastatin (PRAVACHOL) 80 MG tablet Take 80 mg by mouth daily.  . ramipril (ALTACE) 5 MG capsule Take 5 mg by mouth daily.  Marland Kitchen torsemide (DEMADEX) 20 MG tablet Take 1 tablet (20 mg total) by mouth daily.  . traMADol (ULTRAM) 50 MG tablet Take 50 mg by mouth every 8 (eight) hours as needed for moderate pain.   . vitamin B-12 (CYANOCOBALAMIN) 1000 MCG tablet Take 1,000 mcg by mouth daily.   No current facility-administered medications for this visit. (Other)      REVIEW OF SYSTEMS:    ALLERGIES No Known  Allergies  PAST MEDICAL HISTORY Past Medical History:  Diagnosis Date  . Atrial fibrillation (HCC)   . Coronary artery disease    stenting x 2  . Diabetes (HCC)   . Dyslipidemia 04/05/2020  . Hypertension   . Morbid obesity (HCC)   . Pacemaker   . Paroxysmal atrial fibrillation (HCC) 11/06/2019  . Secondary hypercoagulable state (HCC) 11/06/2019  . T2DM (type 2 diabetes mellitus) (HCC) 04/05/2020  . 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 No pertinent surgical history.  FAMILY HISTORY Family History   Problem Relation Age of Onset  . Hypertension Mother   . Stroke Mother        died at age 77  . CAD Father        MI at age of 9    SOCIAL HISTORY Social History   Tobacco Use  . Smoking status: Never Smoker  . Smokeless tobacco: Never Used  Vaping Use  . Vaping Use: Never used  Substance Use Topics  . Alcohol use: Not Currently  . Drug use: Never         OPHTHALMIC EXAM: Base Eye Exam    Visual Acuity (ETDRS)      Right Left   Dist [place] CF @ 1' 20/400   Dist ph [place] NI NI       Tonometry (Tonopen, 1:58 PM)      Right Left   Pressure 13 12       Pupils      Pupils Dark Light Shape React APD   Right PERRL 4 3 Round Brisk None   Left PERRL 4 3 Round Brisk None       Visual Fields (Counting fingers)      Left Right   Restrictions Total inferior nasal deficiency Total superior temporal, superior nasal deficiencies       Extraocular Movement      Right Left    Full Full       Neuro/Psych    Oriented x3: Yes   Mood/Affect: Normal       Dilation    Both eyes: 1.0% Mydriacyl, 2.5% Phenylephrine @ 2:02 PM        Slit Lamp and Fundus Exam    External Exam      Right Left   External Normal Normal       Slit Lamp Exam      Right Left   Lids/Lashes Normal Normal   Conjunctiva/Sclera White and quiet White and quiet   Cornea Clear Clear   Anterior Chamber Deep and quiet Deep and quiet   Iris Round and reactive Round and reactive   Lens Posterior chamber intraocular lens Posterior chamber intraocular lens   Anterior Vitreous Normal Normal       Fundus Exam      Right Left   Posterior Vitreous Posterior vitreous detachment Posterior vitreous detachment   Disc Peripapillary atrophy, extensive region of nonperfusion, bare white sclera approximately 30 disc areas in size encompassing the macula superior to the nerve inferior and nasal to the nerve Peripapillary atrophy, extensive region of nonperfusion, bare white sclera approximately 30 disc areas in  size encompassing the macula superior to the nerve inferior and nasal to the nerve   C/D Ratio 0.45 0.3   Macula Geographic atrophy Geographic atrophy   Vessels Normal Normal   Periphery Normal Normal          IMAGING AND PROCEDURES  Imaging and Procedures for 09/11/20  Color Fundus Photography Optos -  OU - Both Eyes       Right Eye Progression has no prior data. Disc findings include increased cup to disc ratio. Macula : geographic atrophy. Vessels : normal observations. Periphery : normal observations.   Left Eye Progression has no prior data. Disc findings include normal observations. Macula : geographic atrophy. Vessels : normal observations. Periphery : normal observations.   Notes No signs of active CNVM OU                ASSESSMENT/PLAN:  Advanced nonexudative age-related macular degeneration of both eyes with subfoveal involvement The nature of dry age related macular degeneration was discussed with the patient as well as its possible conversion to wet. The results of the AREDS 2 study was discussed with the patient. A diet rich in dark leafy green vegetables was advised and specific recommendations were made regarding supplements with AREDS 2 formulation . Control of hypertension and serum cholesterol may slow the disease. Smoking cessation is mandatory to slow the disease and diminish the risk of progressing to wet age related macular degeneration. The patient was instructed in the use of an Amsler Grid and was told to return immediately for any changes in the Grid. Stressed to the patient do not rub eyes  Diabetes mellitus without complication (HCC) The patient has diabetes without any evidence of retinopathy. The patient advised to maintain good blood glucose control, excellent blood pressure control, and favorable levels of cholesterol, low density lipoprotein, and high density lipoproteins. Follow up in 1 year was recommended. Explained that fluctuations in  visual acuity , or "out of focus", may result from large variations of blood sugar control.      ICD-10-CM   1. Advanced nonexudative age-related macular degeneration of both eyes with subfoveal involvement  H35.3134 Color Fundus Photography Optos - OU - Both Eyes  2. Exudative age-related macular degeneration of both eyes with inactive scar (HCC)  H35.3233 Color Fundus Photography Optos - OU - Both Eyes  3. Diabetes mellitus without complication (HCC)  E11.9 Color Fundus Photography Optos - OU - Both Eyes    1.  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  Now each macula has been affected.  There are no signs of active CNVM. 2. I explained to the patient that database is being maintained to look for this particular type of patient should a clinical trial and/or therapy developed to replenish rejuvenate RPE and/or retinal cells from atrophic dry ARMD  3.  Ophthalmic Meds Ordered this visit:  No orders of the defined types were placed in this encounter.      No follow-ups on file.  There are no Patient Instructions on file for this visit.   Explained the diagnoses, plan, and follow up with the patient and they expressed understanding.  Patient expressed understanding of the importance of proper follow up care.   Alford Highland Yahayra Geis M.D. Diseases & Surgery of the Retina and Vitreous [facility] 09/11/20     Abbreviations: M myopia (nearsighted); A astigmatism; H hyperopia (farsighted); P presbyopia; Mrx spectacle prescription;  CTL contact lenses; OD right eye; OS left eye; OU both eyes  XT exotropia; ET esotropia; PEK punctate epithelial keratitis; PEE punctate epithelial erosions; DES dry eye syndrome; MGD meibomian gland dysfunction; ATs artificial tears; PFAT's preservative free artificial tears; NSC nuclear sclerotic cataract; PSC posterior subcapsular cataract; ERM epi-retinal membrane; PVD posterior vitreous detachment; RD retinal  detachment; DM diabetes mellitus; DR diabetic retinopathy; NPDR non-proliferative diabetic  retinopathy; PDR proliferative diabetic retinopathy; CSME clinically significant macular edema; DME diabetic macular edema; dbh dot blot hemorrhages; CWS cotton wool spot; POAG primary open angle glaucoma; C/D cup-to-disc ratio; HVF humphrey visual field; GVF goldmann visual field; OCT optical coherence tomography; IOP intraocular pressure; BRVO Branch retinal vein occlusion; CRVO central retinal vein occlusion; CRAO central retinal artery occlusion; BRAO branch retinal artery occlusion; RT retinal tear; SB scleral buckle; PPV pars plana vitrectomy; VH Vitreous hemorrhage; PRP panretinal laser photocoagulation; IVK intravitreal kenalog; VMT vitreomacular traction; MH Macular hole;  NVD neovascularization of the disc; NVE neovascularization elsewhere; AREDS age related eye disease study; ARMD age related macular degeneration; POAG primary open angle glaucoma; EBMD epithelial/anterior basement membrane dystrophy; ACIOL anterior chamber intraocular lens; IOL intraocular lens; PCIOL posterior chamber intraocular lens; Phaco/IOL phacoemulsification with intraocular lens placement; Wheeler photorefractive keratectomy; LASIK laser assisted in situ keratomileusis; HTN hypertension; DM diabetes mellitus; COPD chronic obstructive pulmonary disease

## 2020-09-11 NOTE — Assessment & Plan Note (Signed)

## 2020-09-12 NOTE — Progress Notes (Signed)
Remote pacemaker transmission.   

## 2020-10-20 ENCOUNTER — Telehealth: Payer: Self-pay

## 2020-10-20 NOTE — Telephone Encounter (Signed)
Latitude alert for RV pacing > 50%.  Pt with fairly significant increase in RV pacing % over last 2 weeks.   Pt with known history of persist AF, it looks like AF burden has increased in recent months.  In past coupe of week V rates have not been very controlled.    Medications include- Eliquis for OAC, Diltiazem 120mg  daily  Spoke with pt, she reports hse has felt like she was off for the past few months and now if feeling increased SOB.  Pt previously managed in Af clinic and would like input from them on what to do about her AF.

## 2020-10-20 NOTE — Telephone Encounter (Signed)
Called and spoke with patient.  She is aware of appt 10/23/20 at 1:30 pm with Jorja Loa, PA.

## 2020-10-23 ENCOUNTER — Ambulatory Visit (HOSPITAL_COMMUNITY)
Admission: RE | Admit: 2020-10-23 | Discharge: 2020-10-23 | Disposition: A | Discharge status: Home / Self Care | Payer: Medicare Other | Source: Ambulatory Visit | Attending: Physician Assistant | Admitting: Physician Assistant

## 2020-10-23 ENCOUNTER — Encounter (HOSPITAL_COMMUNITY): Payer: Self-pay | Admitting: Physician Assistant

## 2020-10-23 ENCOUNTER — Other Ambulatory Visit: Payer: Self-pay

## 2020-10-23 VITALS — BP 132/80 | HR 106 | Ht 67.0 in | Wt 314.6 lb

## 2020-10-23 DIAGNOSIS — Z79899 Other long term (current) drug therapy: Secondary | ICD-10-CM | POA: Insufficient documentation

## 2020-10-23 DIAGNOSIS — R0600 Dyspnea, unspecified: Secondary | ICD-10-CM | POA: Insufficient documentation

## 2020-10-23 DIAGNOSIS — I484 Atypical atrial flutter: Secondary | ICD-10-CM

## 2020-10-23 DIAGNOSIS — I4892 Unspecified atrial flutter: Secondary | ICD-10-CM | POA: Diagnosis not present

## 2020-10-23 DIAGNOSIS — Z950 Presence of cardiac pacemaker: Secondary | ICD-10-CM | POA: Insufficient documentation

## 2020-10-23 DIAGNOSIS — G4733 Obstructive sleep apnea (adult) (pediatric): Secondary | ICD-10-CM | POA: Insufficient documentation

## 2020-10-23 DIAGNOSIS — I4819 Other persistent atrial fibrillation: Secondary | ICD-10-CM | POA: Insufficient documentation

## 2020-10-23 DIAGNOSIS — Z6841 Body Mass Index (BMI) 40.0 and over, adult: Secondary | ICD-10-CM | POA: Insufficient documentation

## 2020-10-23 DIAGNOSIS — I495 Sick sinus syndrome: Secondary | ICD-10-CM | POA: Insufficient documentation

## 2020-10-23 DIAGNOSIS — D6869 Other thrombophilia: Secondary | ICD-10-CM

## 2020-10-23 DIAGNOSIS — Z7901 Long term (current) use of anticoagulants: Secondary | ICD-10-CM | POA: Insufficient documentation

## 2020-10-23 DIAGNOSIS — E669 Obesity, unspecified: Secondary | ICD-10-CM | POA: Diagnosis not present

## 2020-10-23 DIAGNOSIS — I4510 Unspecified right bundle-branch block: Secondary | ICD-10-CM | POA: Insufficient documentation

## 2020-10-23 DIAGNOSIS — I2510 Atherosclerotic heart disease of native coronary artery without angina pectoris: Secondary | ICD-10-CM | POA: Insufficient documentation

## 2020-10-23 LAB — BASIC METABOLIC PANEL
Anion gap: 14 (ref 5–15)
BUN: 22 mg/dL (ref 8–23)
CO2: 23 mmol/L (ref 22–32)
Calcium: 9.4 mg/dL (ref 8.9–10.3)
Chloride: 99 mmol/L (ref 98–111)
Creatinine, Ser: 1.24 mg/dL — ABNORMAL HIGH (ref 0.44–1.00)
GFR, Estimated: 42 mL/min — ABNORMAL LOW (ref >60)
Glucose, Bld: 183 mg/dL — ABNORMAL HIGH (ref 70–99)
Potassium: 5.1 mmol/L (ref 3.5–5.1)
Sodium: 136 mmol/L (ref 135–145)

## 2020-10-23 MED ORDER — METOPROLOL SUCCINATE ER 25 MG PO TB24: 25.0000 mg | ORAL_TABLET | Freq: Every day | ORAL | 3 refills | Status: DC

## 2020-10-23 NOTE — Patient Instructions (Addendum)
Friday, Saturday & Sunday you will increase your toresmide to 40mg  a day then on Monday you will go back to 20mg  a day   Start Metoprolol 25mg  once a day

## 2020-10-23 NOTE — Progress Notes (Signed)
Primary Care Physician: Kirsten Quitter, MD Primary Electrophysiologist: Dr Graciela Husbands Referring Physician: Dr Hollace Kinnier [name] is a 86 y.o. female with a history of tachybradycardia syndrome s/p PPM, CAD, OSA, HTN, DM, and atrial fibrillation who presents for follow up in the [facility].  The patient was initially diagnosed with atrial fibrillation remotely in [place] and has been maintained on Eliquis for a CHADS2VASC score of 6. Her AF burden on her device has previously been low at <1%. The device clinic was alerted to AF episode on 10/29/19 with RVR. Patient denied any symptoms from her arrhythmia. She has chronic SOB for years and this is stable. She reports she has not been using her CPAP. Of note, she was also recently treated for adhesive capsulitis with a steroid taper. Device clinic received alert for increased AT/AF burden on device since 12/17/19. Patient denies any symptoms from her arrhythmia. She was started on diltiazem for rate control.  On follow up today, patient reports that over the last several weeks she has felt more SOB. She has been treated for an URI the past two weeks and is currently on cefdinir. The device clinic received an alert for elevated V rates over the last 2 weeks. Patient reports she has gained weight and has more lower extremity edema.   Today, she denies symptoms of palpitations, chest pain, orthopnea, PND, dizziness, presyncope, syncope, bleeding, or neurologic sequela. The patient is tolerating medications without difficulties and is otherwise without complaint today.    Atrial Fibrillation Risk Factors:  she does have symptoms or diagnosis of sleep apnea. she is not compliant with CPAP therapy. she does not have a history of rheumatic fever.   she has a BMI of Body mass index is 49.27 kg/m.Marland Kitchen Filed Weights   10/23/20 1404  Weight: (!) 142.7 kg    Family History  Problem Relation Age of Onset  . Hypertension Mother    . Stroke Mother        died at age 44  . CAD Father        MI at age of 15     Atrial Fibrillation Management history:  Previous antiarrhythmic drugs: none Previous cardioversions: none Previous ablations: none CHADS2VASC score: 6 Anticoagulation history: Eliquis   Past Medical History:  Diagnosis Date  . Atrial fibrillation (HCC)   . Coronary artery disease    stenting x 2  . Diabetes (HCC)   . Dyslipidemia 04/05/2020  . Hypertension   . Morbid obesity (HCC)   . Pacemaker   . Paroxysmal atrial fibrillation (HCC) 11/06/2019  . Secondary hypercoagulable state (HCC) 11/06/2019  . T2DM (type 2 diabetes mellitus) (HCC) 04/05/2020  . Tachycardia-bradycardia syndrome (HCC) 03/17/2020   No past surgical history on file.  Current Outpatient Medications  Medication Sig Dispense Refill  . acetaminophen (TYLENOL) 500 MG tablet Take 500 mg by mouth 2 (two) times daily as needed for mild pain.     Marland Kitchen acetaminophen-codeine (TYLENOL #4) 300-60 MG tablet Take 1 tablet by mouth 3 (three) times daily as needed for pain.     Marland Kitchen apixaban (ELIQUIS) 5 MG TABS tablet Take 5 mg by mouth 2 (two) times daily.    . benzonatate (TESSALON) 100 MG capsule Take 100 mg by mouth 3 (three) times daily as needed.    Marland Kitchen buPROPion (WELLBUTRIN) 100 MG tablet Take 100 mg by mouth daily.    . cefdinir (OMNICEF) 300 MG capsule Take 300 mg by mouth  2 (two) times daily.    . Cholecalciferol (VITAMIN D-3) 25 MCG (1000 UT) CAPS Take 1 capsule by mouth daily.    . clopidogrel (PLAVIX) 75 MG tablet Take 75 mg by mouth daily.    Marland Kitchen. diltiazem (CARDIZEM LA) 120 MG 24 hr tablet Take 120 mg by mouth daily.    Marland Kitchen. gemfibrozil (LOPID) 600 MG tablet Take 600 mg by mouth 2 (two) times daily before a meal.    . glipiZIDE (GLUCOTROL XL) 2.5 MG 24 hr tablet Take 2.5 mg by mouth daily with breakfast.    . ketoconazole (NIZORAL) 2 % shampoo Apply 1 application topically daily as needed for irritation. Let sit for 3 minutes before  rinsing.    Marland Kitchen. levothyroxine (SYNTHROID) 50 MCG tablet Take 50 mcg by mouth daily before breakfast.    . magnesium oxide (MAG-OX) 400 MG tablet Take 1 tablet (400 mg total) by mouth daily. 30 tablet 3  . metFORMIN (GLUCOPHAGE-XR) 500 MG 24 hr tablet Take 1,000 mg by mouth 2 (two) times daily.    . Multiple Vitamins-Minerals (OCUVITE ADULT 50+ PO) Take 1 capsule by mouth daily.    . pravastatin (PRAVACHOL) 80 MG tablet Take 80 mg by mouth daily.    . ramipril (ALTACE) 5 MG capsule Take 5 mg by mouth daily.    Marland Kitchen. torsemide (DEMADEX) 20 MG tablet Take 1 tablet (20 mg total) by mouth daily. 90 tablet 3  . vitamin B-12 (CYANOCOBALAMIN) 1000 MCG tablet Take 1,000 mcg by mouth daily.    . metoprolol succinate (TOPROL XL) 25 MG 24 hr tablet Take 1 tablet (25 mg total) by mouth daily. 30 tablet 3   No current facility-administered medications for this encounter.    No Known Allergies  Social History   Socioeconomic History  . Marital status: Widowed    Spouse name: Not on file  . Number of children: Not on file  . Years of education: Not on file  . Highest education level: Not on file  Occupational History  . Not on file  Tobacco Use  . Smoking status: Never Smoker  . Smokeless tobacco: Never Used  Vaping Use  . Vaping Use: Never used  Substance and Sexual Activity  . Alcohol use: Not Currently  . Drug use: Never  . Sexual activity: Not on file  Other Topics Concern  . Not on file  Social History Narrative  . Not on file   Social Determinants of Health   Financial Resource Strain:   . Difficulty of Paying Living Expenses: Not on file  Food Insecurity:   . Worried About Programme researcher, broadcasting/film/videounning Out of Food in the Last Year: Not on file  . Ran Out of Food in the Last Year: Not on file  Transportation Needs:   . Lack of Transportation (Medical): Not on file  . Lack of Transportation (Non-Medical): Not on file  Physical Activity:   . Days of Exercise per Week: Not on file  . Minutes of Exercise  per Session: Not on file  Stress:   . Feeling of Stress : Not on file  Social Connections:   . Frequency of Communication with Friends and Family: Not on file  . Frequency of Social Gatherings with Friends and Family: Not on file  . Attends Religious Services: Not on file  . Active Member of Clubs or Organizations: Not on file  . Attends BankerClub or Organization Meetings: Not on file  . Marital Status: Not on file  Intimate Partner Violence:   .  Fear of Current or Ex-Partner: Not on file  . Emotionally Abused: Not on file  . Physically Abused: Not on file  . Sexually Abused: Not on file     ROS- All systems are reviewed and negative except as per the HPI above.  Physical Exam: Vitals:   10/23/20 1404  BP: 132/80  Pulse: (!) 106  Weight: (!) 142.7 kg  Height: 5\' 7"  (1.702 m)    GEN- The patient is well appearing obese elderly female, alert and oriented x 3 today.   HEENT-head normocephalic, atraumatic, sclera clear, conjunctiva pink, hearing intact, trachea midline. Lungs- Clear to ausculation bilaterally, normal work of breathing Heart- irregular rate and rhythm, no murmurs, rubs or gallops  GI- soft, NT, ND, + BS Extremities- no clubbing, cyanosis. 1+ bilateral edema MS- no significant deformity or atrophy Skin- no rash or lesion Psych- euthymic mood, full affect Neuro- strength and sensation are intact   Wt Readings from Last 3 Encounters:  10/23/20 (!) 142.7 kg  07/29/20 136.1 kg  05/06/20 135.6 kg    EKG today demonstrates atypical atrial flutter HR 106 intermittent V pacing, RBBB, QRS 132, QTc 443  Echo 04/06/20 demonstrated  1. Left ventricular ejection fraction, by estimation, is 50%. The left  ventricle has low normal function. Left ventricular endocardial border not  optimally defined to evaluate regional wall motion. The left ventricular  internal cavity size was mildly  dilated. Left ventricular diastolic parameters are indeterminate.  2. Right  ventricular systolic function is normal. The right ventricular  size is mildly enlarged. There is mildly elevated pulmonary artery  systolic pressure. The estimated right ventricular systolic pressure is  39.0 mmHg.  3. Left atrial size was mildly dilated.  4. Right atrial size was mildly dilated.  5. The mitral valve is degenerative. Mild mitral valve regurgitation. No  evidence of mitral stenosis.  6. The aortic valve is normal in structure. Aortic valve regurgitation is  not visualized. No aortic stenosis is present.  7. The inferior vena cava is normal in size with greater than 50%  respiratory variability, suggesting right atrial pressure of 3 mmHg.  10/2017 Myoview EF 60%, infero-apical defect, no ischemia.  Epic records are reviewed at length today  Assessment and Plan:  1. Persistent atrial fibrillation/atrial tach/atrial flutter Device clinic noted increased V rates with afib. Will start Toprol 25 mg daily for rate control. Continue diltiazem 120 mg daily. Pursuing rate control at this time. Patient did not feel improved when she was in SR. Continue Eliquis 5 mg BID  This patients CHA2DS2-VASc Score and unadjusted Ischemic Stroke Rate (% per year) is equal to 9.7 % stroke rate/year from a score of 6  Above score calculated as 1 point each if present [CHF, HTN, DM, Vascular=MI/PAD/Aortic Plaque, Age if 65-74, or Female] Above score calculated as 2 points each if present [Age > 75, or Stroke/TIA/TE]  2. Obesity Body mass index is 49.27 kg/m. Lifestyle modification was discussed and encouraged including regular physical activity and weight reduction.  3. Obstructive sleep apnea Patient is not compliant with CPAP therapy.  4. Tachybradycardia syndrome S/p PPM implanted in [place]2001 2019.  Followed by Dr [place] and the device clinic.  5. CAD No anginal symptoms.  6. Dyspnea Dyspnea likely multifactorial with URI and fluid overload. Patient's weight is up ~14  lbs over the last 3 months.  Increase torsemide to 40 mg x 3 days, then decrease back to 20 mg daily. Check bmet. Hopefully will improve with better  rate control.    Follow up in the AF clinic next week.    Jorja Loa PA-C [facility] [address] [place], [place] [address] [phone] 10/23/2020 4:13 PM

## 2020-10-26 IMAGING — DX DG CHEST 1V PORT
1 series · 1 of 1 positions shown · non-contrast
Comparison: 10/13/2019

CLINICAL DATA: Chest pain

EXAM:
PORTABLE CHEST 1 VIEW

[chest]
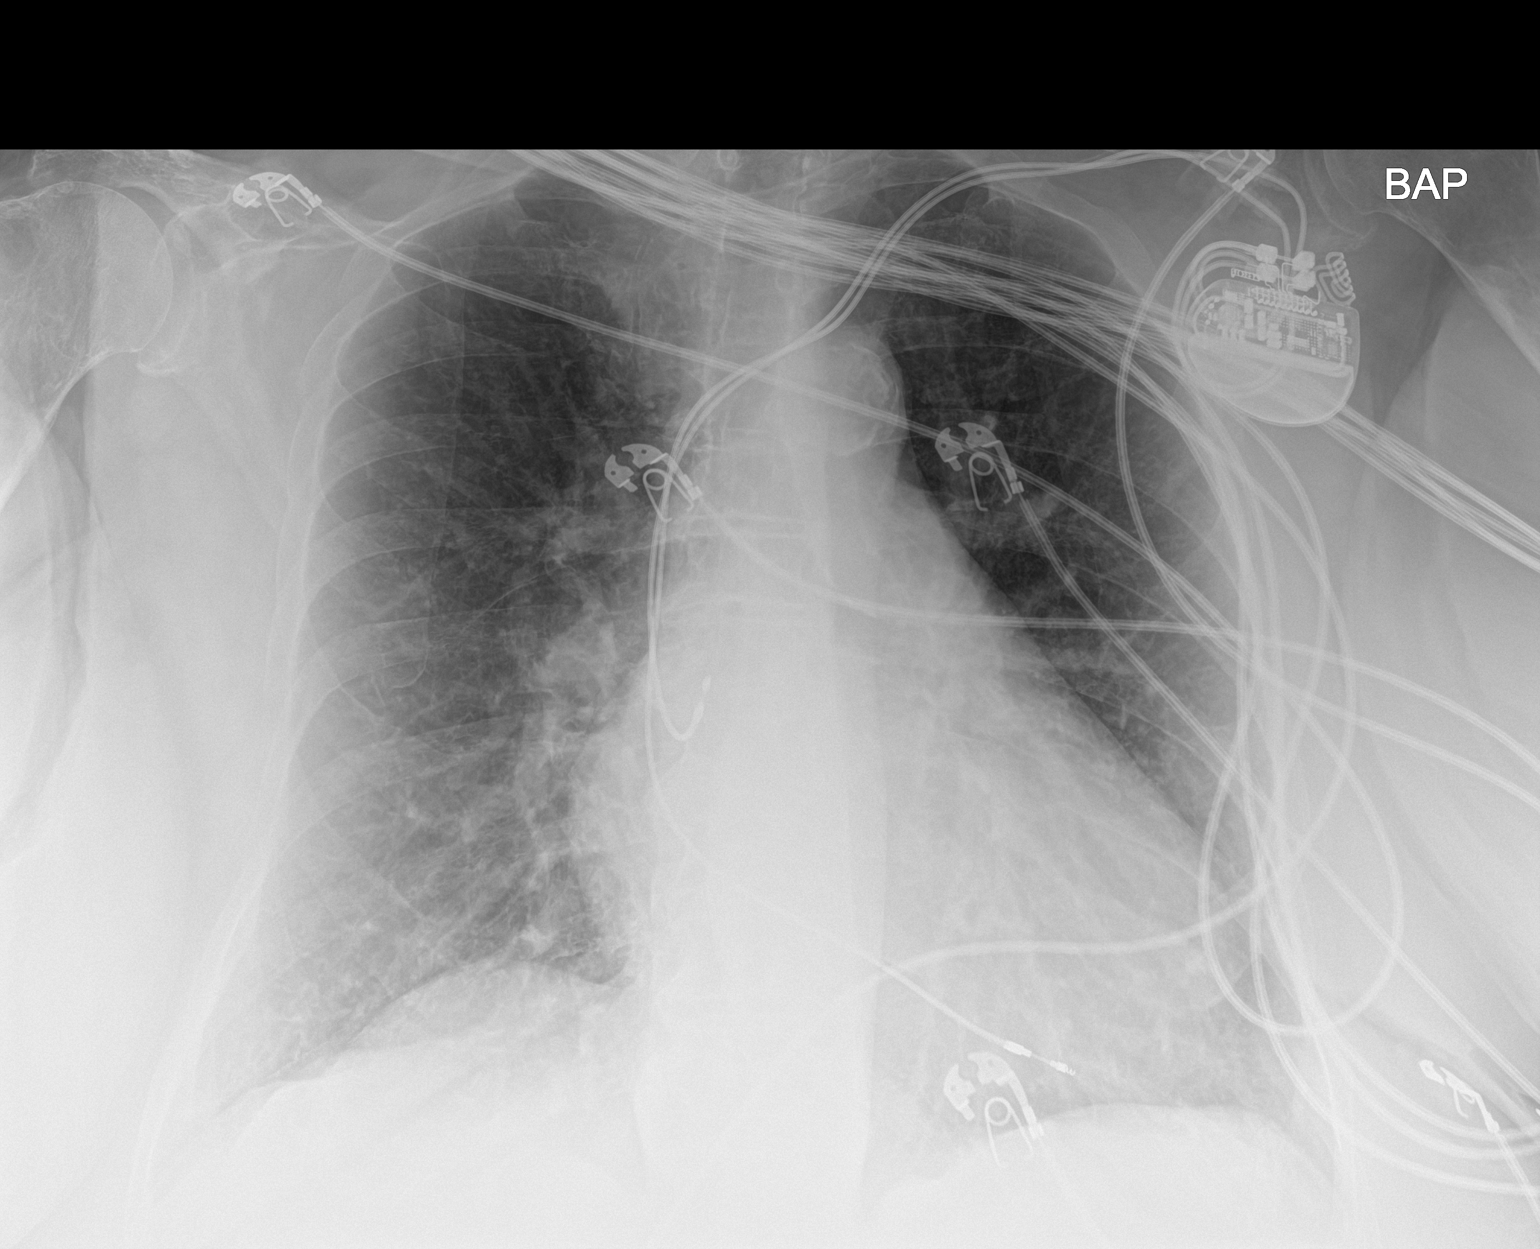

[1 of 1 positions shown; findings below may reference images not displayed]

FINDINGS: Single frontal view of the chest demonstrates stable dual lead
pacemaker. Cardiac silhouette is enlarged but stable. There is
chronic central vascular congestion without airspace disease,
effusion, or pneumothorax. No acute displaced fractures.
IMPRESSION: 1. Stable exam, no acute process.

## 2020-10-30 ENCOUNTER — Encounter (HOSPITAL_COMMUNITY): Payer: Self-pay

## 2020-10-30 ENCOUNTER — Other Ambulatory Visit: Payer: Self-pay

## 2020-10-30 ENCOUNTER — Ambulatory Visit (HOSPITAL_COMMUNITY)
Admission: RE | Admit: 2020-10-30 | Discharge: 2020-10-30 | Disposition: A | Discharge status: Home / Self Care | Payer: Medicare Other | Source: Ambulatory Visit | Attending: Physician Assistant | Admitting: Physician Assistant

## 2020-10-30 ENCOUNTER — Encounter (HOSPITAL_COMMUNITY): Payer: Self-pay | Admitting: Physician Assistant

## 2020-10-30 ENCOUNTER — Ambulatory Visit (HOSPITAL_BASED_OUTPATIENT_CLINIC_OR_DEPARTMENT_OTHER)
Admission: RE | Admit: 2020-10-30 | Discharge: 2020-10-30 | Disposition: A | Discharge status: Home / Self Care | Payer: Medicare Other | Source: Ambulatory Visit | Attending: Physician Assistant | Admitting: Physician Assistant

## 2020-10-30 ENCOUNTER — Inpatient Hospital Stay (HOSPITAL_COMMUNITY)
Admission: EM | Admit: 2020-10-30 | Discharge: 2020-11-08 | DRG: 377 | Disposition: A | Discharge status: Home Health | Payer: Medicare Other | Attending: Internal Medicine | Admitting: Internal Medicine

## 2020-10-30 VITALS — BP 128/78 | HR 96 | Ht 67.0 in | Wt 314.0 lb

## 2020-10-30 DIAGNOSIS — Z7902 Long term (current) use of antithrombotics/antiplatelets: Secondary | ICD-10-CM

## 2020-10-30 DIAGNOSIS — Z6841 Body Mass Index (BMI) 40.0 and over, adult: Secondary | ICD-10-CM

## 2020-10-30 DIAGNOSIS — D6869 Other thrombophilia: Secondary | ICD-10-CM | POA: Diagnosis not present

## 2020-10-30 DIAGNOSIS — E039 Hypothyroidism, unspecified: Secondary | ICD-10-CM | POA: Diagnosis present

## 2020-10-30 DIAGNOSIS — Z951 Presence of aortocoronary bypass graft: Secondary | ICD-10-CM | POA: Insufficient documentation

## 2020-10-30 DIAGNOSIS — Z79890 Hormone replacement therapy (postmenopausal): Secondary | ICD-10-CM

## 2020-10-30 DIAGNOSIS — Z950 Presence of cardiac pacemaker: Secondary | ICD-10-CM

## 2020-10-30 DIAGNOSIS — Z9119 Patient's noncompliance with other medical treatment and regimen: Secondary | ICD-10-CM

## 2020-10-30 DIAGNOSIS — K264 Chronic or unspecified duodenal ulcer with hemorrhage: Principal | ICD-10-CM | POA: Diagnosis present

## 2020-10-30 DIAGNOSIS — E1165 Type 2 diabetes mellitus with hyperglycemia: Secondary | ICD-10-CM | POA: Diagnosis not present

## 2020-10-30 DIAGNOSIS — Z8249 Family history of ischemic heart disease and other diseases of the circulatory system: Secondary | ICD-10-CM

## 2020-10-30 DIAGNOSIS — R059 Cough, unspecified: Secondary | ICD-10-CM

## 2020-10-30 DIAGNOSIS — R0902 Hypoxemia: Secondary | ICD-10-CM | POA: Diagnosis not present

## 2020-10-30 DIAGNOSIS — I509 Heart failure, unspecified: Secondary | ICD-10-CM

## 2020-10-30 DIAGNOSIS — R0600 Dyspnea, unspecified: Secondary | ICD-10-CM | POA: Insufficient documentation

## 2020-10-30 DIAGNOSIS — R052 Subacute cough: Secondary | ICD-10-CM | POA: Insufficient documentation

## 2020-10-30 DIAGNOSIS — N179 Acute kidney failure, unspecified: Secondary | ICD-10-CM | POA: Diagnosis not present

## 2020-10-30 DIAGNOSIS — Z7984 Long term (current) use of oral hypoglycemic drugs: Secondary | ICD-10-CM

## 2020-10-30 DIAGNOSIS — H3530 Unspecified macular degeneration: Secondary | ICD-10-CM | POA: Diagnosis present

## 2020-10-30 DIAGNOSIS — K635 Polyp of colon: Secondary | ICD-10-CM | POA: Diagnosis present

## 2020-10-30 DIAGNOSIS — K922 Gastrointestinal hemorrhage, unspecified: Secondary | ICD-10-CM | POA: Diagnosis not present

## 2020-10-30 DIAGNOSIS — I495 Sick sinus syndrome: Secondary | ICD-10-CM | POA: Insufficient documentation

## 2020-10-30 DIAGNOSIS — I5033 Acute on chronic diastolic (congestive) heart failure: Secondary | ICD-10-CM | POA: Diagnosis present

## 2020-10-30 DIAGNOSIS — K648 Other hemorrhoids: Secondary | ICD-10-CM | POA: Diagnosis present

## 2020-10-30 DIAGNOSIS — E669 Obesity, unspecified: Secondary | ICD-10-CM | POA: Insufficient documentation

## 2020-10-30 DIAGNOSIS — I2510 Atherosclerotic heart disease of native coronary artery without angina pectoris: Secondary | ICD-10-CM | POA: Insufficient documentation

## 2020-10-30 DIAGNOSIS — E119 Type 2 diabetes mellitus without complications: Secondary | ICD-10-CM | POA: Insufficient documentation

## 2020-10-30 DIAGNOSIS — E785 Hyperlipidemia, unspecified: Secondary | ICD-10-CM | POA: Diagnosis present

## 2020-10-30 DIAGNOSIS — G4733 Obstructive sleep apnea (adult) (pediatric): Secondary | ICD-10-CM | POA: Diagnosis present

## 2020-10-30 DIAGNOSIS — I10 Essential (primary) hypertension: Secondary | ICD-10-CM | POA: Insufficient documentation

## 2020-10-30 DIAGNOSIS — B3781 Candidal esophagitis: Secondary | ICD-10-CM | POA: Diagnosis not present

## 2020-10-30 DIAGNOSIS — Z79899 Other long term (current) drug therapy: Secondary | ICD-10-CM

## 2020-10-30 DIAGNOSIS — Z7901 Long term (current) use of anticoagulants: Secondary | ICD-10-CM

## 2020-10-30 DIAGNOSIS — I4821 Permanent atrial fibrillation: Secondary | ICD-10-CM | POA: Insufficient documentation

## 2020-10-30 DIAGNOSIS — I482 Chronic atrial fibrillation, unspecified: Secondary | ICD-10-CM

## 2020-10-30 DIAGNOSIS — D500 Iron deficiency anemia secondary to blood loss (chronic): Secondary | ICD-10-CM | POA: Diagnosis present

## 2020-10-30 DIAGNOSIS — Z20822 Contact with and (suspected) exposure to covid-19: Secondary | ICD-10-CM | POA: Diagnosis present

## 2020-10-30 DIAGNOSIS — K5730 Diverticulosis of large intestine without perforation or abscess without bleeding: Secondary | ICD-10-CM | POA: Diagnosis present

## 2020-10-30 DIAGNOSIS — Z823 Family history of stroke: Secondary | ICD-10-CM

## 2020-10-30 DIAGNOSIS — K449 Diaphragmatic hernia without obstruction or gangrene: Secondary | ICD-10-CM | POA: Diagnosis present

## 2020-10-30 DIAGNOSIS — K644 Residual hemorrhoidal skin tags: Secondary | ICD-10-CM | POA: Diagnosis present

## 2020-10-30 DIAGNOSIS — Z955 Presence of coronary angioplasty implant and graft: Secondary | ICD-10-CM

## 2020-10-30 DIAGNOSIS — I130 Hypertensive heart and chronic kidney disease with heart failure and stage 1 through stage 4 chronic kidney disease, or unspecified chronic kidney disease: Secondary | ICD-10-CM | POA: Diagnosis present

## 2020-10-30 DIAGNOSIS — N1831 Chronic kidney disease, stage 3a: Secondary | ICD-10-CM | POA: Diagnosis present

## 2020-10-30 DIAGNOSIS — I5032 Chronic diastolic (congestive) heart failure: Secondary | ICD-10-CM

## 2020-10-30 DIAGNOSIS — E1122 Type 2 diabetes mellitus with diabetic chronic kidney disease: Secondary | ICD-10-CM | POA: Diagnosis present

## 2020-10-30 DIAGNOSIS — R079 Chest pain, unspecified: Secondary | ICD-10-CM

## 2020-10-30 DIAGNOSIS — I4892 Unspecified atrial flutter: Secondary | ICD-10-CM | POA: Diagnosis present

## 2020-10-30 DIAGNOSIS — H109 Unspecified conjunctivitis: Secondary | ICD-10-CM | POA: Diagnosis not present

## 2020-10-30 LAB — BASIC METABOLIC PANEL
Anion gap: 14 (ref 5–15)
BUN: 40 mg/dL — ABNORMAL HIGH (ref 8–23)
CO2: 23 mmol/L (ref 22–32)
Calcium: 9.3 mg/dL (ref 8.9–10.3)
Chloride: 99 mmol/L (ref 98–111)
Creatinine, Ser: 1.32 mg/dL — ABNORMAL HIGH (ref 0.44–1.00)
GFR, Estimated: 39 mL/min — ABNORMAL LOW (ref >60)
Glucose, Bld: 319 mg/dL — ABNORMAL HIGH (ref 70–99)
Potassium: 3.9 mmol/L (ref 3.5–5.1)
Sodium: 136 mmol/L (ref 135–145)

## 2020-10-30 LAB — CBC
HCT: 26.7 % — ABNORMAL LOW (ref 36.0–46.0)
HCT: 26.7 % — ABNORMAL LOW (ref 36.0–46.0)
Hemoglobin: 8 g/dL — ABNORMAL LOW (ref 12.0–15.0)
Hemoglobin: 8.2 g/dL — ABNORMAL LOW (ref 12.0–15.0)
MCH: 28.9 pg (ref 26.0–34.0)
MCH: 29.9 pg (ref 26.0–34.0)
MCHC: 30 g/dL (ref 30.0–36.0)
MCHC: 30.7 g/dL (ref 30.0–36.0)
MCV: 96.4 fL (ref 80.0–100.0)
MCV: 97.4 fL (ref 80.0–100.0)
Platelets: 382 10*3/uL (ref 150–400)
Platelets: 399 10*3/uL (ref 150–400)
RBC: 2.77 MIL/uL — ABNORMAL LOW (ref 3.87–5.11)
RBC: 2.74 MIL/uL — ABNORMAL LOW (ref 3.87–5.11)
RDW: 15.1 % (ref 11.5–15.5)
RDW: 15.2 % (ref 11.5–15.5)
WBC: 10.8 10*3/uL — ABNORMAL HIGH (ref 4.0–10.5)
WBC: 9.6 10*3/uL (ref 4.0–10.5)
nRBC: 0.3 % — ABNORMAL HIGH (ref 0.0–0.2)
nRBC: 0.3 % — ABNORMAL HIGH (ref 0.0–0.2)

## 2020-10-30 LAB — COMPREHENSIVE METABOLIC PANEL
ALT: 50 U/L — ABNORMAL HIGH (ref 0–44)
AST: 81 U/L — ABNORMAL HIGH (ref 15–41)
Albumin: 3.2 g/dL — ABNORMAL LOW (ref 3.5–5.0)
Alkaline Phosphatase: 156 U/L — ABNORMAL HIGH (ref 38–126)
Anion gap: 14 (ref 5–15)
BUN: 39 mg/dL — ABNORMAL HIGH (ref 8–23)
CO2: 23 mmol/L (ref 22–32)
Calcium: 9.3 mg/dL (ref 8.9–10.3)
Chloride: 100 mmol/L (ref 98–111)
Creatinine, Ser: 1.33 mg/dL — ABNORMAL HIGH (ref 0.44–1.00)
GFR, Estimated: 39 mL/min — ABNORMAL LOW (ref >60)
Glucose, Bld: 288 mg/dL — ABNORMAL HIGH (ref 70–99)
Potassium: 3.6 mmol/L (ref 3.5–5.1)
Sodium: 137 mmol/L (ref 135–145)
Total Bilirubin: 0.7 mg/dL (ref 0.3–1.2)
Total Protein: 6.8 g/dL (ref 6.5–8.1)

## 2020-10-30 LAB — TYPE AND SCREEN
ABO/RH(D): B NEG
Antibody Screen: NEGATIVE

## 2020-10-30 LAB — ABO/RH: ABO/RH(D): B NEG

## 2020-10-30 LAB — BRAIN NATRIURETIC PEPTIDE: B Natriuretic Peptide: 628.9 pg/mL — ABNORMAL HIGH (ref 0.0–100.0)

## 2020-10-30 MED ORDER — GUAIFENESIN 100 MG/5ML PO SOLN
5.0000 mL | Freq: Once | ORAL | Status: AC
  Administered 2020-10-31: 100 mg via ORAL
  Filled 2020-10-30: qty 5

## 2020-10-30 MED ORDER — FUROSEMIDE 10 MG/ML IJ SOLN
40.0000 mg | Freq: Once | INTRAMUSCULAR | Status: AC
  Administered 2020-10-31: 40 mg via INTRAVENOUS
  Filled 2020-10-30: qty 4

## 2020-10-30 NOTE — Addendum Note (Signed)
Encounter addended by: Danice Goltz, PA on: 10/30/2020 4:54 PM  Actions taken: Clinical Note Signed

## 2020-10-30 NOTE — ED Provider Notes (Signed)
[facility] EMERGENCY DEPARTMENT Provider Note   CSN: [id number] Arrival date & time: 10/30/20  2007     History Chief Complaint  Patient presents with  . Abnormal Lab    Kirsten Jensen is a 86 y.o. female with a history of atrial fibrillation on Eliquis, atrial flutter, CAD s/p stent x2, tachybradycardia syndrome s/p PPM, OSA not compliant with CPAP, HTN, dyslipidemia, diabetes mellitus type 2 who presents to the emergency department from the A. fib clinic with a chief complaint of abnormal lab.  The patient was seen earlier today at the A. fib clinic for follow-up.  Labs drawn during the visit demonstrated a hemoglobin of 8.0.  Hemoglobin was 11.6 in April.  She was advised to come to the ER for further work-up and evaluation.  The patient states that she has noticed that her bowel movements have been dark recently, but denies that they have been black or tarry.  She does note that her bowel movements became more dark when she was started on a new medication, but she cannot recall the name of the medication. She denies hematochezia.  However, she does report a history of hematochezia due to hemorrhoids and was followed by a gastroenterologist when she lived in [place].  She is not established with a gastroenterologist since moving to [place].  She does also note that she has had intermittent nosebleeds.  Last episode of epistaxis was more than a week ago.  She has not had to come to the ER for epistaxis and blood has not contained large clots.  She denies hematemesis, rash, gingival bleeding, hematuria, or vaginal bleeding.  No history of GI bleed.  The patient reports that she has been having worsening dyspnea with exertion since July.  She states that she has not very active; however, she will occasionally ambulate in her room to the bathroom [facility] she is currently living.  When she ambulates, she develops pressure-like central chest pain, dyspnea,  nausea, and near syncope.  Symptoms resolved with rest.  No chest pain at this time. She currently denies shortness of breath, nausea, vomiting, dizziness, lightheadedness, diaphoresis, palpitations abdominal pain, diarrhea, constipation, fever, chills at this time.    She does note that her legs are much more swollen than usual. She also notes that she has recently she had a 14 pound weight gain.  She denies orthopnea and PND. Her home torsemide was increased for 3 days.  Per chart review, patient's weight was 135.6 kg on 5/25. Today, she is 142.4 kg.   She also reports that about 3 weeks ago that she developed URI symptoms.  She was placed on a 7-day antibiotic.  However, she cannot recall the name of the antibiotic.  She reports minimal improvement with symptoms despite compliance with the antibiotic.  She continues to endorse a dry cough.  She is unsure if the cough is worse at night.    Last echo was April 2021 with EF of 50%.    The history is provided by the patient and medical records. No language interpreter was used.       Past Medical History:  Diagnosis Date  . Atrial fibrillation (HCC)   . Coronary artery disease    stenting x 2  . Diabetes (HCC)   . Dyslipidemia 04/05/2020  . Hypertension   . Morbid obesity (HCC)   . Pacemaker   . Paroxysmal atrial fibrillation (HCC) 11/06/2019  . Secondary hypercoagulable state (HCC) 11/06/2019  .  T2DM (type 2 diabetes mellitus) (HCC) 04/05/2020  . Tachycardia-bradycardia syndrome (HCC) 03/17/2020    Patient Active Problem List   Diagnosis Date Noted  . Upper GI bleed 10/31/2020  . Advanced nonexudative age-related macular degeneration of both eyes with subfoveal involvement 09/11/2020  . Exudative age-related macular degeneration of both eyes with inactive scar (HCC) 09/11/2020  . Diabetes mellitus without complication (HCC) 09/11/2020  . Chest pain 04/05/2020  . T2DM (type 2 diabetes mellitus) (HCC) 04/05/2020  . HTN (hypertension)  04/05/2020  . Dyslipidemia 04/05/2020  . Morbid obesity with BMI of 45.0-49.9, adult (HCC) 04/05/2020  . Tachycardia-bradycardia syndrome (HCC) 03/17/2020  . Pacemaker 03/17/2020  . Atypical atrial flutter (HCC) 02/05/2020  . Paroxysmal atrial fibrillation (HCC) 11/06/2019  . Secondary hypercoagulable state (HCC) 11/06/2019    History reviewed. No pertinent surgical history.   OB History   No obstetric history on file.     Family History  Problem Relation Age of Onset  . Hypertension Mother   . Stroke Mother        died at age 50  . CAD Father        MI at age of 73    Social History   Tobacco Use  . Smoking status: Never Smoker  . Smokeless tobacco: Never Used  Vaping Use  . Vaping Use: Never used  Substance Use Topics  . Alcohol use: Not Currently  . Drug use: Never    Home Medications Prior to Admission medications   Medication Sig Start Date End Date Taking? Authorizing Provider  acetaminophen (TYLENOL) 500 MG tablet Take 500 mg by mouth 2 (two) times daily as needed for mild pain.     [provider]  acetaminophen-codeine (TYLENOL #4) 300-60 MG tablet Take 1 tablet by mouth 3 (three) times daily as needed for pain.     [provider]  apixaban (ELIQUIS) 5 MG TABS tablet Take 5 mg by mouth 2 (two) times daily.    [provider]  benzonatate (TESSALON) 100 MG capsule Take 100 mg by mouth 3 (three) times daily as needed. 10/17/20   [provider]  buPROPion (WELLBUTRIN) 100 MG tablet Take 100 mg by mouth daily.    [provider]  cefdinir (OMNICEF) 300 MG capsule Take 300 mg by mouth 2 (two) times daily. 10/17/20   [provider]  Cholecalciferol (VITAMIN D-3) 25 MCG (1000 UT) CAPS Take 1 capsule by mouth daily.    [provider]  clopidogrel (PLAVIX) 75 MG tablet Take 75 mg by mouth daily.    [provider]  diltiazem (CARDIZEM LA) 120 MG 24 hr tablet Take 120 mg by mouth daily.     [provider]  gemfibrozil (LOPID) 600 MG tablet Take 600 mg by mouth 2 (two) times daily before a meal.    [provider]  glipiZIDE (GLUCOTROL XL) 2.5 MG 24 hr tablet Take 2.5 mg by mouth daily with breakfast.    [provider]  ketoconazole (NIZORAL) 2 % shampoo Apply 1 application topically daily as needed for irritation. Let sit for 3 minutes before rinsing.    [provider]  levothyroxine (SYNTHROID) 50 MCG tablet Take 50 mcg by mouth daily before breakfast.    [provider]  magnesium oxide (MAG-OX) 400 MG tablet Take 1 tablet (400 mg total) by mouth daily. 02/05/20   Fenton, Clint R, PA  metFORMIN (GLUCOPHAGE-XR) 500 MG 24 hr tablet Take 1,000 mg by mouth 2 (two) times daily.  [provider]  metoprolol succinate (TOPROL XL) 25 MG 24 hr tablet Take 1 tablet (25 mg total) by mouth daily. 10/23/20 10/23/21  Fenton, Clint R, PA  Multiple Vitamins-Minerals (OCUVITE ADULT 50+ PO) Take 1 capsule by mouth daily.    [provider]  pravastatin (PRAVACHOL) 80 MG tablet Take 80 mg by mouth daily.    [provider]  ramipril (ALTACE) 5 MG capsule Take 5 mg by mouth daily.    [provider]  torsemide (DEMADEX) 20 MG tablet Take 1 tablet (20 mg total) by mouth daily. 10/18/19   Duke SalviaKlein, Steven C, MD  vitamin B-12 (CYANOCOBALAMIN) 1000 MCG tablet Take 1,000 mcg by mouth daily.    [provider]    Allergies    Patient has no known allergies.  Review of Systems   Review of Systems  Constitutional: Negative for activity change, chills, diaphoresis and fever.  HENT: Negative for congestion and sore throat.   Eyes: Negative for visual disturbance.  Respiratory: Positive for cough, chest tightness and shortness of breath.   Cardiovascular: Positive for chest pain.  Gastrointestinal: Positive for nausea. Negative for abdominal pain, blood in stool, constipation, diarrhea, rectal pain and vomiting.   Genitourinary: Negative for dysuria, hematuria and vaginal bleeding.  Musculoskeletal: Negative for back pain, myalgias and neck pain.  Skin: Negative for rash.  Allergic/Immunologic: Negative for immunocompromised state.  Neurological: Positive for light-headedness. Negative for dizziness, syncope, weakness, numbness and headaches.  Psychiatric/Behavioral: Negative for confusion.    Physical Exam Updated Vital Signs BP 110/76 (BP Location: Left Wrist)   Pulse (!) 58   Temp 97.6 F (36.4 C) (Oral)   Resp 19   Ht 5\' 7"  (1.702 m)   Wt (!) 144.6 kg   LMP  (LMP Unknown)   SpO2 99%   BMI 49.93 kg/m   Physical Exam Vitals and nursing note reviewed.  Constitutional:      General: She is not in acute distress.    Appearance: She is obese. She is not ill-appearing, toxic-appearing or diaphoretic.     Comments: Elderly, chronically ill-appearing female in no acute distress.  HENT:     Head: Normocephalic.  Eyes:     Conjunctiva/sclera: Conjunctivae normal.  Cardiovascular:     Rate and Rhythm: Normal rate. Rhythm irregular.     Heart sounds: No murmur heard.  No friction rub. No gallop.   Pulmonary:     Effort: Pulmonary effort is normal. No respiratory distress.     Breath sounds: No stridor.     Comments: Crackles in the bilateral bases.  Persistent nonproductive cough throughout exam. Chest:     Chest wall: No tenderness.  Abdominal:     General: There is no distension.     Palpations: Abdomen is soft. There is no mass.     Tenderness: There is no abdominal tenderness. There is no right CVA tenderness, left CVA tenderness, guarding or rebound.     Hernia: No hernia is present.     Comments: Abdomen is soft, nontender, nondistended.  Normoactive bowel sounds.  Negative Murphy sign.  No tenderness over McBurney's point.  No CVA tenderness bilaterally.  Genitourinary:    Comments: Chaperoned exam.  Normal rectal tone.  Stool is dark brown.  No gross blood.  Nonthrombosed  external hemorrhoids are noted. Musculoskeletal:     Cervical back: Normal range of motion and neck supple.     Right lower leg: Edema present.     Left lower leg: Edema  present.     Comments: 2+ pitting edema to the bilateral lower extremities.  Skin:    General: Skin is warm.     Findings: No rash.  Neurological:     Mental Status: She is alert.  Psychiatric:        Behavior: Behavior normal.     ED Results / Procedures / Treatments   Labs (all labs ordered are listed, but only abnormal results are displayed) Labs Reviewed  COMPREHENSIVE METABOLIC PANEL - Abnormal; Notable for the following components:      Result Value   Glucose, Bld 288 (*)    BUN 39 (*)    Creatinine, Ser 1.33 (*)    Albumin 3.2 (*)    AST 81 (*)    ALT 50 (*)    Alkaline Phosphatase 156 (*)    GFR, Estimated 39 (*)    All other components within normal limits  CBC - Abnormal; Notable for the following components:   RBC 2.74 (*)    Hemoglobin 8.2 (*)    HCT 26.7 (*)    nRBC 0.3 (*)    All other components within normal limits  PROTIME-INR - Abnormal; Notable for the following components:   Prothrombin Time 21.5 (*)    INR 2.0 (*)    All other components within normal limits  CBC WITH DIFFERENTIAL/PLATELET - Abnormal; Notable for the following components:   RBC 2.69 (*)    Hemoglobin 8.1 (*)    HCT 25.9 (*)    All other components within normal limits  COMPREHENSIVE METABOLIC PANEL - Abnormal; Notable for the following components:   Glucose, Bld 246 (*)    BUN 37 (*)    Creatinine, Ser 1.25 (*)    Total Protein 6.4 (*)    Albumin 3.1 (*)    AST 71 (*)    ALT 47 (*)    Alkaline Phosphatase 155 (*)    GFR, Estimated 42 (*)    All other components within normal limits  HEMOGLOBIN A1C - Abnormal; Notable for the following components:   Hgb A1c MFr Bld 7.9 (*)    All other components within normal limits  IRON AND TIBC - Abnormal; Notable for the following components:   Iron 23 (*)     Saturation Ratios 5 (*)    All other components within normal limits  VITAMIN B12 - Abnormal; Notable for the following components:   Vitamin B-12 1,164 (*)    All other components within normal limits  GLUCOSE, CAPILLARY - Abnormal; Notable for the following components:   Glucose-Capillary 167 (*)    All other components within normal limits  GLUCOSE, CAPILLARY - Abnormal; Notable for the following components:   Glucose-Capillary 132 (*)    All other components within normal limits  POC OCCULT BLOOD, ED - Abnormal; Notable for the following components:   Fecal Occult Bld POSITIVE (*)    All other components within normal limits  CBG MONITORING, ED - Abnormal; Notable for the following components:   Glucose-Capillary 248 (*)    All other components within normal limits  TROPONIN I (HIGH SENSITIVITY) - Abnormal; Notable for the following components:   Troponin I (High Sensitivity) 26 (*)    All other components within normal limits  TROPONIN I (HIGH SENSITIVITY) - Abnormal; Notable for the following components:   Troponin I (High Sensitivity) 25 (*)    All other components within normal limits  RESPIRATORY PANEL BY RT PCR (FLU A&B, COVID)  FERRITIN  FOLATE  HEMOGLOBIN AND HEMATOCRIT, BLOOD  HEMOGLOBIN AND HEMATOCRIT, BLOOD  HEMOGLOBIN AND HEMATOCRIT, BLOOD  TSH  TYPE AND SCREEN  ABO/RH    EKG None  Radiology DG Chest 2 View  Result Date: 10/30/2020 CLINICAL DATA:  Cough EXAM: CHEST - 2 VIEW COMPARISON:  April 2021 FINDINGS: Mild interstitial prominence appears chronic. No pleural effusion. Mild cardiomegaly. Left chest wall dual lead pacemaker. No acute osseous abnormality. IMPRESSION: Chronic interstitial changes. Electronically Signed   By: Guadlupe Spanish M.D.   On: 10/30/2020 16:11    Procedures .Critical Care Performed by: Barkley Boards, PA-C Authorized by: Barkley Boards, PA-C   Critical care provider statement:    Critical care time (minutes):  45   Critical  care time was exclusive of:  Separately billable procedures and treating other patients and teaching time   Critical care was necessary to treat or prevent imminent or life-threatening deterioration of the following conditions:  Cardiac failure (GI bleed)   Critical care was time spent personally by me on the following activities:  Ordering and performing treatments and interventions, ordering and review of laboratory studies, ordering and review of radiographic studies, pulse oximetry, re-evaluation of patient's condition, obtaining history from patient or surrogate, examination of patient, evaluation of patient's response to treatment and review of old charts   I assumed direction of critical care for this patient from another provider in my specialty: no     (including critical care time)  Medications Ordered in ED Medications  pantoprazole (PROTONIX) 80 mg in sodium chloride 0.9 % 100 mL (0.8 mg/mL) infusion (8 mg/hr Intravenous New Bag/Given 10/31/20 0132)  levothyroxine (SYNTHROID) tablet 50 mcg (has no administration in time range)  metoprolol succinate (TOPROL-XL) 24 hr tablet 12.5 mg (has no administration in time range)  pravastatin (PRAVACHOL) tablet 80 mg (has no administration in time range)  vitamin B-12 (CYANOCOBALAMIN) tablet 1,000 mcg (has no administration in time range)  insulin aspart (novoLOG) injection 0-9 Units (2 Units Subcutaneous Given 10/31/20 0417)  chlorhexidine (PERIDEX) 0.12 % solution 15 mL (has no administration in time range)  MEDLINE mouth rinse (has no administration in time range)  benzonatate (TESSALON) capsule 200 mg (200 mg Oral Given 10/31/20 0504)  furosemide (LASIX) injection 40 mg (40 mg Intravenous Given 10/31/20 0022)  guaiFENesin (ROBITUSSIN) 100 MG/5ML solution 100 mg (100 mg Oral Given 10/31/20 0056)  pantoprazole (PROTONIX) 80 mg in sodium chloride 0.9 % 100 mL IVPB ( Intravenous Restarted 10/31/20 0300)    ED Course  I have reviewed the  triage vital signs and the nursing notes.  Pertinent labs & imaging results that were available during my care of the patient were reviewed by me and considered in my medical decision making (see chart for details).  Clinical Course as of Nov 01 755  Fri Oct 31, 2020  5573 A secure chat message was sent to Dr. Matthias Hughs with [facility] for a.m. gastroenterology consult.   [MM]  0122 Spoke with Dr. Julianne Handler, cardiology.  He recommends holding Eliquis given concern for GI bleed.   [MM]    Clinical Course User Index [MM] Sean Malinowski A, PA-C   MDM Rules/Calculators/A&P                          CHA2DS2-VASc Score = 6  The patient's score is based upon: CHF History: 1 HTN History: 1 Diabetes History: 1 Stroke History: 0 Vascular Disease History: 0 Age Score: 2 Gender Score: 1  ASSESSMENT AND PLAN: Persistent Atrial Fibrillation (ICD10:  I48.19) The patient's CHA2DS2-VASc score is 6, indicating a 9.7% annual risk of stroke.    Secondary Hypercoagulable State (ICD10:  D68.69) The patient is at significant risk for stroke/thromboembolism based upon her CHA2DS2-VASc Score of 6.  However, the patient is on anticoagulation due to her CHA2DS2-VASc score, but this is being held at this time given concern for GI bleed.  Signed,  Barkley Boards, PA-C    10/31/2020 7:56 AM     This is a 86 y.o. female with a history of atrial fibrillation on Eliquis, atrial flutter, CAD s/p stent x2, tachybradycardia syndrome s/p PPM, OSA not compliant with CPAP, HTN, dyslipidemia, diabetes mellitus type 2 who presents to the ED for concern of  low hemoglobin.  She has also been having exertional chest pressure, shortness of breath, and nausea.  Her legs have been swollen and she has had a 14 pound weight gain.  The patient was seen and independently evaluated by Dr. Pilar Plate, attending physician.    Vitals and Exam:    Normotensive.  No tachypnea, hypoxia, or tachycardia.  She is afebrile.   Lab Tests:     I ordered, reviewed, and interpreted labs that showed elevated BNP, concerning for volume overload from congestive heart failure.  Hemoccult is positive and hemoglobin is 8.2 today, down from 11.6 in April. BUN is also elevated.  This is more suspicious for an upper GI bleed.  She does also have elevated transaminases and total bilirubin.  However, abdomen is soft and non-tender.  She does not have a surgical abdomen at this time. Will add on PT/INR.   Imaging Studies ordered:    I independently visualized and interpreted chest x-ray ordered by A. fib clinic earlier today, which demonstrated chronic interstitial changes.   Additional history obtained:    Previous records obtained and reviewed  Medicines ordered:    I ordered IV Lasix x1, guaifenesin for cough, and infusion bolus of Protonix for suspected GI bleed   Consultations Obtained:    I consulted the hospitalist team and Dr. Margo Aye has accepted the patient for admission  I sent a secure chat message to Dr. Matthias Hughs with GI for a.m. evaluation as patient is currently hemodynamically stable  I spoke with Dr. Estrella Deeds, cardiology, at the request of Dr. Margo Aye regarding recommendations for anticoagulation in the setting of suspected GI bleed  Critical Interventions:     She was started on Protonix for suspected GI bleed.  Plan and Disposition:   Patient has been admitted to the hospitalist service for further work up and evaluation. The patient appears reasonably stabilized for admission considering the current resources, flow, and capabilities available in the ED at this time, and I doubt any other [facility] requiring further screening and/or treatment in the ED prior to admission.   Final Clinical Impression(s) / ED Diagnoses Final diagnoses:  Gastrointestinal hemorrhage, unspecified gastrointestinal hemorrhage type  Acute congestive heart failure, unspecified heart failure type [facility])  Exertional chest pain    Rx / DC  Orders ED Discharge Orders    None       Barkley Boards, PA-C 10/31/20 0756    Sabas Sous, MD 11/03/20 2338

## 2020-10-30 NOTE — ED Triage Notes (Signed)
Pt reports that she saw her PCP today for an URI and some SOB, pt reports hx of afib and flutter, pt reports called by PCP and told her HBG was 8 and was 11 last week. Reports dark colored stool, pt is on Eliquis

## 2020-10-30 NOTE — ED Notes (Signed)
Pt was in a-fib during orthostatics, HR for sitting vitals sign ran between 96-100bpm

## 2020-10-30 NOTE — Progress Notes (Addendum)
Primary Care Physician: Melida Quitter, MD Primary Electrophysiologist: Dr Graciela Husbands Referring Physician: Dr Hollace Kinnier Kirsten Jensen is a 86 y.o. female with a history of tachybradycardia syndrome s/p PPM, CAD, OSA, HTN, DM, and atrial fibrillation who presents for follow up in the [facility].  The patient was initially diagnosed with atrial fibrillation remotely in [place] and has been maintained on Eliquis for a CHADS2VASC score of 6. Her AF burden on her device has previously been low at <1%. The device clinic was alerted to AF episode on 10/29/19 with RVR. Patient denied any symptoms from her arrhythmia. She has chronic SOB for years and this is stable. She reports she has not been using her CPAP. Of note, she was also recently treated for adhesive capsulitis with a steroid taper. Device clinic received alert for increased AT/AF burden on device since 12/17/19. Patient denies any symptoms from her arrhythmia. She was started on diltiazem for rate control.  Patient was seen at her PCP office 10/17/20 for cough and was treated for an URI. CXR showed no acute process at that time (spoke to nurse at Dr Sigurd Sos office). She completed her course of antibiotics. The device clinic received an alert for elevated V rates over the same time period.   On follow up today, patient feels symptoms have mildly improved since her last visit. Her heart rate has improved with the addition of metoprolol. Unfortunately, her cough seems much worse today.   Today, she denies symptoms of palpitations, chest pain, orthopnea, PND, dizziness, presyncope, syncope, bleeding, or neurologic sequela. The patient is tolerating medications without difficulties and is otherwise without complaint today.    Atrial Fibrillation Risk Factors:  she does have symptoms or diagnosis of sleep apnea. she is not compliant with CPAP therapy. she does not have a history of rheumatic fever.   she has a BMI of Body  mass index is 49.18 kg/m.Marland Kitchen Filed Weights   10/30/20 1427  Weight: (!) 142.4 kg    Family History  Problem Relation Age of Onset  . Hypertension Mother   . Stroke Mother        died at age 32  . CAD Father        MI at age of 60     Atrial Fibrillation Management history:  Previous antiarrhythmic drugs: none Previous cardioversions: none Previous ablations: none CHADS2VASC score: 6 Anticoagulation history: Eliquis   Past Medical History:  Diagnosis Date  . Atrial fibrillation (HCC)   . Coronary artery disease    stenting x 2  . Diabetes (HCC)   . Dyslipidemia 04/05/2020  . Hypertension   . Morbid obesity (HCC)   . Pacemaker   . Paroxysmal atrial fibrillation (HCC) 11/06/2019  . Secondary hypercoagulable state (HCC) 11/06/2019  . T2DM (type 2 diabetes mellitus) (HCC) 04/05/2020  . Tachycardia-bradycardia syndrome (HCC) 03/17/2020   No past surgical history on file.  Current Outpatient Medications  Medication Sig Dispense Refill  . acetaminophen (TYLENOL) 500 MG tablet Take 500 mg by mouth 2 (two) times daily as needed for mild pain.     Marland Kitchen acetaminophen-codeine (TYLENOL #4) 300-60 MG tablet Take 1 tablet by mouth 3 (three) times daily as needed for pain.     Marland Kitchen apixaban (ELIQUIS) 5 MG TABS tablet Take 5 mg by mouth 2 (two) times daily.    . benzonatate (TESSALON) 100 MG capsule Take 100 mg by mouth 3 (three) times daily as needed.    Marland Kitchen  buPROPion (WELLBUTRIN) 100 MG tablet Take 100 mg by mouth daily.    . cefdinir (OMNICEF) 300 MG capsule Take 300 mg by mouth 2 (two) times daily.    . Cholecalciferol (VITAMIN D-3) 25 MCG (1000 UT) CAPS Take 1 capsule by mouth daily.    . clopidogrel (PLAVIX) 75 MG tablet Take 75 mg by mouth daily.    Marland Kitchen diltiazem (CARDIZEM LA) 120 MG 24 hr tablet Take 120 mg by mouth daily.    Marland Kitchen gemfibrozil (LOPID) 600 MG tablet Take 600 mg by mouth 2 (two) times daily before a meal.    . glipiZIDE (GLUCOTROL XL) 2.5 MG 24 hr tablet Take 2.5 mg by mouth  daily with breakfast.    . ketoconazole (NIZORAL) 2 % shampoo Apply 1 application topically daily as needed for irritation. Let sit for 3 minutes before rinsing.    Marland Kitchen levothyroxine (SYNTHROID) 50 MCG tablet Take 50 mcg by mouth daily before breakfast.    . magnesium oxide (MAG-OX) 400 MG tablet Take 1 tablet (400 mg total) by mouth daily. 30 tablet 3  . metFORMIN (GLUCOPHAGE-XR) 500 MG 24 hr tablet Take 1,000 mg by mouth 2 (two) times daily.    . metoprolol succinate (TOPROL XL) 25 MG 24 hr tablet Take 1 tablet (25 mg total) by mouth daily. 30 tablet 3  . Multiple Vitamins-Minerals (OCUVITE ADULT 50+ PO) Take 1 capsule by mouth daily.    . pravastatin (PRAVACHOL) 80 MG tablet Take 80 mg by mouth daily.    . ramipril (ALTACE) 5 MG capsule Take 5 mg by mouth daily.    Marland Kitchen torsemide (DEMADEX) 20 MG tablet Take 1 tablet (20 mg total) by mouth daily. 90 tablet 3  . vitamin B-12 (CYANOCOBALAMIN) 1000 MCG tablet Take 1,000 mcg by mouth daily.     No current facility-administered medications for this encounter.    No Known Allergies  Social History   Socioeconomic History  . Marital status: Widowed    Spouse name: Not on file  . Number of children: Not on file  . Years of education: Not on file  . Highest education level: Not on file  Occupational History  . Not on file  Tobacco Use  . Smoking status: Never Smoker  . Smokeless tobacco: Never Used  Vaping Use  . Vaping Use: Never used  Substance and Sexual Activity  . Alcohol use: Not Currently  . Drug use: Never  . Sexual activity: Not on file  Other Topics Concern  . Not on file  Social History Narrative  . Not on file   Social Determinants of Health   Financial Resource Strain:   . Difficulty of Paying Living Expenses: Not on file  Food Insecurity:   . Worried About Programme researcher, broadcasting/film/video in the Last Year: Not on file  . Ran Out of Food in the Last Year: Not on file  Transportation Needs:   . Lack of Transportation (Medical):  Not on file  . Lack of Transportation (Non-Medical): Not on file  Physical Activity:   . Days of Exercise per Week: Not on file  . Minutes of Exercise per Session: Not on file  Stress:   . Feeling of Stress : Not on file  Social Connections:   . Frequency of Communication with Friends and Family: Not on file  . Frequency of Social Gatherings with Friends and Family: Not on file  . Attends Religious Services: Not on file  . Active Member of Clubs or Organizations:  Not on file  . Attends Banker Meetings: Not on file  . Marital Status: Not on file  Intimate Partner Violence:   . Fear of Current or Ex-Partner: Not on file  . Emotionally Abused: Not on file  . Physically Abused: Not on file  . Sexually Abused: Not on file     ROS- All systems are reviewed and negative except as per the HPI above.  Physical Exam: Vitals:   10/30/20 1427  BP: 128/78  Pulse: 96  Weight: (!) 142.4 kg  Height: 5\' 7"  (1.702 m)    GEN- The patient is well appearing obese elderly female, alert and oriented x 3 today.   HEENT-head normocephalic, atraumatic, sclera clear, conjunctiva pink, hearing intact, trachea midline. Lungs- Clear to ausculation bilaterally, cough, mildly increased work of breathing Heart- irregular rate and rhythm, no murmurs, rubs or gallops  GI- soft, NT, ND, + BS Extremities- no clubbing, cyanosis. 1+ bilateral edema MS- no significant deformity or atrophy Skin- no rash or lesion Psych- euthymic mood, full affect Neuro- strength and sensation are intact   Wt Readings from Last 3 Encounters:  10/30/20 (!) 142.4 kg  10/23/20 (!) 142.7 kg  07/29/20 136.1 kg    EKG today demonstrates atypical atrial flutter with variable block HR 96, PVCs, QRS 136, QTc 444  Echo 04/06/20 demonstrated  1. Left ventricular ejection fraction, by estimation, is 50%. The left  ventricle has low normal function. Left ventricular endocardial border not  optimally defined to  evaluate regional wall motion. The left ventricular  internal cavity size was mildly  dilated. Left ventricular diastolic parameters are indeterminate.  2. Right ventricular systolic function is normal. The right ventricular  size is mildly enlarged. There is mildly elevated pulmonary artery  systolic pressure. The estimated right ventricular systolic pressure is  39.0 mmHg.  3. Left atrial size was mildly dilated.  4. Right atrial size was mildly dilated.  5. The mitral valve is degenerative. Mild mitral valve regurgitation. No  evidence of mitral stenosis.  6. The aortic valve is normal in structure. Aortic valve regurgitation is  not visualized. No aortic stenosis is present.  7. The inferior vena cava is normal in size with greater than 50%  respiratory variability, suggesting right atrial pressure of 3 mmHg.  10/2017 Myoview EF 60%, infero-apical defect, no ischemia.  Epic records are reviewed at length today  Assessment and Plan:  1. Permanent atrial fibrillation/atrial tach/atrial flutter Heart rate improved today.  Continue Toprol 25 mg daily Continue diltiazem 120 mg daily. Pursuing rate control at this time. Patient did not feel improved when she was in SR. Continue Eliquis 5 mg BID  This patients CHA2DS2-VASc Score and unadjusted Ischemic Stroke Rate (% per year) is equal to 9.7 % stroke rate/year from a score of 6  Above score calculated as 1 point each if present [CHF, HTN, DM, Vascular=MI/PAD/Aortic Plaque, Age if 65-74, or Female] Above score calculated as 2 points each if present [Age > 75, or Stroke/TIA/TE]  2. Obesity Body mass index is 49.18 kg/m. Lifestyle modification was discussed and encouraged including regular physical activity and weight reduction.  3. Obstructive sleep apnea Patient is not compliant with CPAP therapy.  4. Tachybradycardia syndrome S/p PPM [place] 2019.  Followed by Dr [place] and the device clinic.  5. CAD  No anginal symptoms.  6. Dyspnea/cough Only minimally improved.  Unclear etiology. Her cough appears much worse since her last visit which is concerning. Reports she  completed abx treatment last Friday. Check bmet//CBC/BNP/echo/repeat CXR. Per patient report, she had a CXR last week, will request records from PCP.   Follow up with Dr Graciela HusbandsKlein or EP APP in one month.    Addendum: Labs today show renal function declined, BNP elevated at 630 indicating likely fluid overload, CXR showed no acute process. Hgb 8.0 down from 9.4 at PCP office.   Jorja Loaicky Fenton PA-C [facility] [address] [place], [place] [address] [phone] 10/30/2020 2:32 PM

## 2020-10-31 ENCOUNTER — Inpatient Hospital Stay (HOSPITAL_COMMUNITY): Payer: Medicare Other

## 2020-10-31 DIAGNOSIS — I4819 Other persistent atrial fibrillation: Secondary | ICD-10-CM | POA: Diagnosis not present

## 2020-10-31 DIAGNOSIS — K264 Chronic or unspecified duodenal ulcer with hemorrhage: Secondary | ICD-10-CM | POA: Diagnosis not present

## 2020-10-31 DIAGNOSIS — N1831 Chronic kidney disease, stage 3a: Secondary | ICD-10-CM | POA: Diagnosis present

## 2020-10-31 DIAGNOSIS — K922 Gastrointestinal hemorrhage, unspecified: Secondary | ICD-10-CM | POA: Diagnosis present

## 2020-10-31 DIAGNOSIS — I495 Sick sinus syndrome: Secondary | ICD-10-CM | POA: Diagnosis present

## 2020-10-31 DIAGNOSIS — I482 Chronic atrial fibrillation, unspecified: Secondary | ICD-10-CM | POA: Diagnosis not present

## 2020-10-31 DIAGNOSIS — I4821 Permanent atrial fibrillation: Secondary | ICD-10-CM | POA: Diagnosis not present

## 2020-10-31 DIAGNOSIS — Z6841 Body Mass Index (BMI) 40.0 and over, adult: Secondary | ICD-10-CM | POA: Diagnosis not present

## 2020-10-31 DIAGNOSIS — R0902 Hypoxemia: Secondary | ICD-10-CM | POA: Diagnosis not present

## 2020-10-31 DIAGNOSIS — D6869 Other thrombophilia: Secondary | ICD-10-CM | POA: Diagnosis not present

## 2020-10-31 DIAGNOSIS — I5021 Acute systolic (congestive) heart failure: Secondary | ICD-10-CM | POA: Diagnosis not present

## 2020-10-31 DIAGNOSIS — I5031 Acute diastolic (congestive) heart failure: Secondary | ICD-10-CM

## 2020-10-31 DIAGNOSIS — I130 Hypertensive heart and chronic kidney disease with heart failure and stage 1 through stage 4 chronic kidney disease, or unspecified chronic kidney disease: Secondary | ICD-10-CM | POA: Diagnosis not present

## 2020-10-31 DIAGNOSIS — I509 Heart failure, unspecified: Secondary | ICD-10-CM | POA: Diagnosis not present

## 2020-10-31 DIAGNOSIS — D500 Iron deficiency anemia secondary to blood loss (chronic): Secondary | ICD-10-CM | POA: Diagnosis not present

## 2020-10-31 DIAGNOSIS — K644 Residual hemorrhoidal skin tags: Secondary | ICD-10-CM | POA: Diagnosis present

## 2020-10-31 DIAGNOSIS — I480 Paroxysmal atrial fibrillation: Secondary | ICD-10-CM | POA: Diagnosis not present

## 2020-10-31 DIAGNOSIS — I4892 Unspecified atrial flutter: Secondary | ICD-10-CM | POA: Diagnosis not present

## 2020-10-31 DIAGNOSIS — Z950 Presence of cardiac pacemaker: Secondary | ICD-10-CM

## 2020-10-31 DIAGNOSIS — H3530 Unspecified macular degeneration: Secondary | ICD-10-CM | POA: Diagnosis present

## 2020-10-31 DIAGNOSIS — G4733 Obstructive sleep apnea (adult) (pediatric): Secondary | ICD-10-CM | POA: Diagnosis not present

## 2020-10-31 DIAGNOSIS — E785 Hyperlipidemia, unspecified: Secondary | ICD-10-CM | POA: Diagnosis present

## 2020-10-31 DIAGNOSIS — D649 Anemia, unspecified: Secondary | ICD-10-CM | POA: Diagnosis not present

## 2020-10-31 DIAGNOSIS — I5033 Acute on chronic diastolic (congestive) heart failure: Secondary | ICD-10-CM | POA: Diagnosis not present

## 2020-10-31 DIAGNOSIS — I10 Essential (primary) hypertension: Secondary | ICD-10-CM | POA: Diagnosis not present

## 2020-10-31 DIAGNOSIS — N179 Acute kidney failure, unspecified: Secondary | ICD-10-CM | POA: Diagnosis not present

## 2020-10-31 DIAGNOSIS — R079 Chest pain, unspecified: Secondary | ICD-10-CM | POA: Diagnosis not present

## 2020-10-31 DIAGNOSIS — K635 Polyp of colon: Secondary | ICD-10-CM | POA: Diagnosis present

## 2020-10-31 DIAGNOSIS — K449 Diaphragmatic hernia without obstruction or gangrene: Secondary | ICD-10-CM | POA: Diagnosis present

## 2020-10-31 DIAGNOSIS — Z20822 Contact with and (suspected) exposure to covid-19: Secondary | ICD-10-CM | POA: Diagnosis not present

## 2020-10-31 DIAGNOSIS — I2510 Atherosclerotic heart disease of native coronary artery without angina pectoris: Secondary | ICD-10-CM | POA: Diagnosis present

## 2020-10-31 DIAGNOSIS — K5730 Diverticulosis of large intestine without perforation or abscess without bleeding: Secondary | ICD-10-CM | POA: Diagnosis present

## 2020-10-31 DIAGNOSIS — B3781 Candidal esophagitis: Secondary | ICD-10-CM | POA: Diagnosis not present

## 2020-10-31 DIAGNOSIS — R0602 Shortness of breath: Secondary | ICD-10-CM | POA: Diagnosis not present

## 2020-10-31 DIAGNOSIS — I5032 Chronic diastolic (congestive) heart failure: Secondary | ICD-10-CM | POA: Diagnosis not present

## 2020-10-31 DIAGNOSIS — K648 Other hemorrhoids: Secondary | ICD-10-CM | POA: Diagnosis present

## 2020-10-31 LAB — COMPREHENSIVE METABOLIC PANEL
ALT: 47 U/L — ABNORMAL HIGH (ref 0–44)
AST: 71 U/L — ABNORMAL HIGH (ref 15–41)
Albumin: 3.1 g/dL — ABNORMAL LOW (ref 3.5–5.0)
Alkaline Phosphatase: 155 U/L — ABNORMAL HIGH (ref 38–126)
Anion gap: 12 (ref 5–15)
BUN: 37 mg/dL — ABNORMAL HIGH (ref 8–23)
CO2: 26 mmol/L (ref 22–32)
Calcium: 9.1 mg/dL (ref 8.9–10.3)
Chloride: 100 mmol/L (ref 98–111)
Creatinine, Ser: 1.25 mg/dL — ABNORMAL HIGH (ref 0.44–1.00)
GFR, Estimated: 42 mL/min — ABNORMAL LOW (ref >60)
Glucose, Bld: 246 mg/dL — ABNORMAL HIGH (ref 70–99)
Potassium: 3.6 mmol/L (ref 3.5–5.1)
Sodium: 138 mmol/L (ref 135–145)
Total Bilirubin: 0.7 mg/dL (ref 0.3–1.2)
Total Protein: 6.4 g/dL — ABNORMAL LOW (ref 6.5–8.1)

## 2020-10-31 LAB — IRON AND TIBC
Iron: 23 ug/dL — ABNORMAL LOW (ref 28–170)
Saturation Ratios: 5 % — ABNORMAL LOW (ref 10.4–31.8)
TIBC: 434 ug/dL (ref 250–450)
UIBC: 411 ug/dL

## 2020-10-31 LAB — CBC WITH DIFFERENTIAL/PLATELET
Abs Immature Granulocytes: 0.06 10*3/uL (ref 0.00–0.07)
Basophils Absolute: 0 10*3/uL (ref 0.0–0.1)
Basophils Relative: 0 %
Eosinophils Absolute: 0 10*3/uL (ref 0.0–0.5)
Eosinophils Relative: 0 %
HCT: 25.9 % — ABNORMAL LOW (ref 36.0–46.0)
Hemoglobin: 8.1 g/dL — ABNORMAL LOW (ref 12.0–15.0)
Immature Granulocytes: 1 %
Lymphocytes Relative: 11 %
Lymphs Abs: 1 10*3/uL (ref 0.7–4.0)
MCH: 30.1 pg (ref 26.0–34.0)
MCHC: 31.3 g/dL (ref 30.0–36.0)
MCV: 96.3 fL (ref 80.0–100.0)
Monocytes Absolute: 0.8 10*3/uL (ref 0.1–1.0)
Monocytes Relative: 9 %
Neutro Abs: 7.2 10*3/uL (ref 1.7–7.7)
Neutrophils Relative %: 79 %
Platelets: 374 10*3/uL (ref 150–400)
RBC: 2.69 MIL/uL — ABNORMAL LOW (ref 3.87–5.11)
RDW: 15.3 % (ref 11.5–15.5)
WBC: 9.2 10*3/uL (ref 4.0–10.5)
nRBC: 0.2 % (ref 0.0–0.2)

## 2020-10-31 LAB — HEMOGLOBIN AND HEMATOCRIT, BLOOD
HCT: 27.6 % — ABNORMAL LOW (ref 36.0–46.0)
HCT: 26.1 % — ABNORMAL LOW (ref 36.0–46.0)
Hemoglobin: 8.5 g/dL — ABNORMAL LOW (ref 12.0–15.0)
Hemoglobin: 8.1 g/dL — ABNORMAL LOW (ref 12.0–15.0)

## 2020-10-31 LAB — HEMOGLOBIN A1C
Hgb A1c MFr Bld: 7.9 % — ABNORMAL HIGH (ref 4.8–5.6)
Mean Plasma Glucose: 180.03 mg/dL

## 2020-10-31 LAB — FOLATE: Folate: 6.6 ng/mL (ref >5.9)

## 2020-10-31 LAB — GLUCOSE, CAPILLARY
Glucose-Capillary: 122 mg/dL — ABNORMAL HIGH (ref 70–99)
Glucose-Capillary: 132 mg/dL — ABNORMAL HIGH (ref 70–99)
Glucose-Capillary: 158 mg/dL — ABNORMAL HIGH (ref 70–99)
Glucose-Capillary: 167 mg/dL — ABNORMAL HIGH (ref 70–99)
Glucose-Capillary: 176 mg/dL — ABNORMAL HIGH (ref 70–99)
Glucose-Capillary: 207 mg/dL — ABNORMAL HIGH (ref 70–99)
Glucose-Capillary: 217 mg/dL — ABNORMAL HIGH (ref 70–99)

## 2020-10-31 LAB — FERRITIN: Ferritin: 25 ng/mL (ref 11–307)

## 2020-10-31 LAB — CBG MONITORING, ED: Glucose-Capillary: 248 mg/dL — ABNORMAL HIGH (ref 70–99)

## 2020-10-31 LAB — ECHOCARDIOGRAM COMPLETE
Height: 67 in
S' Lateral: 4.1 cm
Weight: 5100.56 oz

## 2020-10-31 LAB — PROTIME-INR
INR: 2 — ABNORMAL HIGH (ref 0.8–1.2)
Prothrombin Time: 21.5 seconds — ABNORMAL HIGH (ref 11.4–15.2)

## 2020-10-31 LAB — RESPIRATORY PANEL BY RT PCR (FLU A&B, COVID)
Influenza A by PCR: NEGATIVE
Influenza B by PCR: NEGATIVE
SARS Coronavirus 2 by RT PCR: NEGATIVE

## 2020-10-31 LAB — TROPONIN I (HIGH SENSITIVITY)
Troponin I (High Sensitivity): 25 ng/L — ABNORMAL HIGH (ref <18)
Troponin I (High Sensitivity): 26 ng/L — ABNORMAL HIGH (ref <18)

## 2020-10-31 LAB — VITAMIN B12: Vitamin B-12: 1164 pg/mL — ABNORMAL HIGH (ref 180–914)

## 2020-10-31 LAB — TSH: TSH: 1.05 u[IU]/mL (ref 0.350–4.500)

## 2020-10-31 LAB — POC OCCULT BLOOD, ED: Fecal Occult Bld: POSITIVE — AB

## 2020-10-31 MED ORDER — PRAVASTATIN SODIUM 40 MG PO TABS
80.0000 mg | ORAL_TABLET | Freq: Every day | ORAL | Status: DC
  Administered 2020-10-31 – 2020-11-08 (×9): 80 mg via ORAL
  Filled 2020-10-31 (×10): qty 2

## 2020-10-31 MED ORDER — PANTOPRAZOLE SODIUM 40 MG IV SOLR
40.0000 mg | Freq: Two times a day (BID) | INTRAVENOUS | Status: DC
  Administered 2020-10-31 (×2): 40 mg via INTRAVENOUS
  Filled 2020-10-31 (×2): qty 40

## 2020-10-31 MED ORDER — ORAL CARE MOUTH RINSE
15.0000 mL | Freq: Two times a day (BID) | OROMUCOSAL | Status: DC
  Administered 2020-11-01 – 2020-11-05 (×2): 15 mL via OROMUCOSAL

## 2020-10-31 MED ORDER — METOPROLOL SUCCINATE ER 25 MG PO TB24
25.0000 mg | ORAL_TABLET | Freq: Every day | ORAL | Status: DC
  Administered 2020-11-01: 25 mg via ORAL
  Filled 2020-10-31 (×2): qty 1

## 2020-10-31 MED ORDER — VITAMIN B-12 1000 MCG PO TABS
1000.0000 ug | ORAL_TABLET | Freq: Every day | ORAL | Status: DC
  Administered 2020-10-31 – 2020-11-08 (×9): 1000 ug via ORAL
  Filled 2020-10-31 (×9): qty 1

## 2020-10-31 MED ORDER — SODIUM CHLORIDE 0.9 % IV SOLN
80.0000 mg | Freq: Once | INTRAVENOUS | Status: AC
  Administered 2020-10-31: 80 mg via INTRAVENOUS
  Filled 2020-10-31 (×2): qty 80

## 2020-10-31 MED ORDER — INSULIN ASPART 100 UNIT/ML ~~LOC~~ SOLN
0.0000 [IU] | SUBCUTANEOUS | Status: DC
  Administered 2020-10-31: 2 [IU] via SUBCUTANEOUS
  Administered 2020-10-31 (×2): 3 [IU] via SUBCUTANEOUS
  Administered 2020-10-31 (×2): 2 [IU] via SUBCUTANEOUS
  Administered 2020-11-01: 5 [IU] via SUBCUTANEOUS
  Administered 2020-11-01: 2 [IU] via SUBCUTANEOUS
  Administered 2020-11-01: 3 [IU] via SUBCUTANEOUS
  Administered 2020-11-01 (×2): 2 [IU] via SUBCUTANEOUS
  Administered 2020-11-02: 1 [IU] via SUBCUTANEOUS
  Administered 2020-11-02 (×4): 2 [IU] via SUBCUTANEOUS
  Administered 2020-11-02: 5 [IU] via SUBCUTANEOUS
  Administered 2020-11-03: 7 [IU] via SUBCUTANEOUS
  Administered 2020-11-03 (×2): 5 [IU] via SUBCUTANEOUS
  Administered 2020-11-03: 7 [IU] via SUBCUTANEOUS
  Administered 2020-11-03: 2 [IU] via SUBCUTANEOUS
  Administered 2020-11-03: 3 [IU] via SUBCUTANEOUS
  Administered 2020-11-04: 2 [IU] via SUBCUTANEOUS
  Administered 2020-11-04: 5 [IU] via SUBCUTANEOUS
  Administered 2020-11-04: 3 [IU] via SUBCUTANEOUS
  Administered 2020-11-04 (×3): 2 [IU] via SUBCUTANEOUS
  Administered 2020-11-05: 1 [IU] via SUBCUTANEOUS
  Administered 2020-11-05: 2 [IU] via SUBCUTANEOUS
  Administered 2020-11-05 (×2): 3 [IU] via SUBCUTANEOUS
  Administered 2020-11-05: 2 [IU] via SUBCUTANEOUS
  Administered 2020-11-06: 1 [IU] via SUBCUTANEOUS
  Administered 2020-11-06: 7 [IU] via SUBCUTANEOUS
  Administered 2020-11-06 (×2): 2 [IU] via SUBCUTANEOUS
  Administered 2020-11-06: 3 [IU] via SUBCUTANEOUS
  Administered 2020-11-06: 5 [IU] via SUBCUTANEOUS
  Administered 2020-11-07: 2 [IU] via SUBCUTANEOUS
  Administered 2020-11-07: 3 [IU] via SUBCUTANEOUS
  Administered 2020-11-07: 2 [IU] via SUBCUTANEOUS

## 2020-10-31 MED ORDER — LEVOTHYROXINE SODIUM 50 MCG PO TABS
50.0000 ug | ORAL_TABLET | Freq: Every day | ORAL | Status: DC
  Administered 2020-10-31 – 2020-11-08 (×9): 50 ug via ORAL
  Filled 2020-10-31 (×9): qty 1

## 2020-10-31 MED ORDER — BUPROPION HCL 100 MG PO TABS
100.0000 mg | ORAL_TABLET | Freq: Every day | ORAL | Status: DC
  Administered 2020-10-31 – 2020-11-08 (×8): 100 mg via ORAL
  Filled 2020-10-31 (×10): qty 1

## 2020-10-31 MED ORDER — PEG 3350-KCL-NA BICARB-NACL 420 G PO SOLR
4000.0000 mL | Freq: Once | ORAL | Status: AC
  Administered 2020-10-31: 4000 mL via ORAL
  Filled 2020-10-31: qty 4000

## 2020-10-31 MED ORDER — BENZONATATE 100 MG PO CAPS
200.0000 mg | ORAL_CAPSULE | Freq: Three times a day (TID) | ORAL | Status: DC | PRN
  Administered 2020-10-31 – 2020-11-08 (×13): 200 mg via ORAL
  Filled 2020-10-31 (×16): qty 2

## 2020-10-31 MED ORDER — CHLORHEXIDINE GLUCONATE 0.12 % MT SOLN
15.0000 mL | Freq: Two times a day (BID) | OROMUCOSAL | Status: DC
  Administered 2020-10-31 – 2020-11-06 (×11): 15 mL via OROMUCOSAL
  Filled 2020-10-31 (×11): qty 15

## 2020-10-31 MED ORDER — METOPROLOL SUCCINATE ER 25 MG PO TB24: 12.5000 mg | ORAL_TABLET | Freq: Every day | ORAL | Status: DC

## 2020-10-31 MED ORDER — SODIUM CHLORIDE 0.9 % IV SOLN
8.0000 mg/h | INTRAVENOUS | Status: DC
  Administered 2020-10-31: 8 mg/h via INTRAVENOUS
  Filled 2020-10-31 (×3): qty 80

## 2020-10-31 NOTE — Progress Notes (Signed)
Full consultation note to follow, from our physician assistant.  This is a patient presenting with symptomatic anemia (shortness of breath) in the setting of a roughly 3.5 g drop in hemoglobin over the past 7 months, documented heme positive stool in the ER, low iron saturation but low normal ferritin level and normal MCV and RDW, so this is equivocal for iron deficiency anemia due to chronic GI blood loss.  The patient does have hemorrhoids which bleed intermittently, but otherwise no localizing GI tract symptoms such as involuntary weight loss, reflux, dysphagia, nausea, abdominal pain, or irritable bowel.  On exam, the patient is cognitively intact but morbidly obese with associated difficulty with mobility.  Chest is clear, heart has a regular rhythm with adequately controlled rate, abdomen is very obese, lower extremities are very adipose but no focal neurologic deficits.  I do feel the patient is an appropriate candidate for endoscopy and colonoscopy.  The patient indicates she has had both of these procedures in the past, although it has been at least 5 years since her colonoscopy, which was done in [place].  I reviewed the risks of endoscopy and colonoscopy with the patient and she is agreeable to proceed.  She is aware it will be done by my partner tomorrow morning.  Kirsten Jensen, M.D. Pager [phone] If no answer or after 5 PM call [phone]

## 2020-10-31 NOTE — Consult Note (Signed)
Cardiology Consult    Patient ID: Davy Faught MRN: [id number], DOB/AGE: 84-Nov-1933   Admit date: 10/30/2020 Date of Consult: 10/31/2020  Primary Physician: Melida Quitter, MD Primary Cardiologist: Sherryl Manges, MD Requesting Provider: Dow Adolph, DO  Patient Profile    Kirsten Jensen is a 86 y.o. female with a history of tachybradycardia syndrome s/p PPM, CAD, OSA, HTN, DM, and atrial fibrillation whom we were asked to evaluate in the setting of suspected gastrointestinal bleed and recent chest pressure and dyspnea.  History of Present Illness    Kirsten Jensen was seen in AF clinic today with Kirsten Jensen for follow-up of her paroxysmal atrial fibrillation.  She reports to me that since around August of this year, she has been having episodes of exertional shortness of breath which will progress to chest pressure.  She stops exerting herself after that (she is almost certain she would pass out if she continued) and exertional bouts are limited to ambulation from her bed to the bathroom.  She is currently living in an assisted living facility but she relays significant concerns to me that she isn't getting the correct medications most of the time.  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  She is maintained on a rate control strategy at this time given lack of improvement in symptoms when in sinus with apixban 5 mg BID.  Labs obtained today showed hemoglobin of 8.0 from 11.6 in April (9.5 recently in PCP office) and she was instructed to present to ED.  She denies symptomatic bleeding but reports she has hemorrhoids that cause her trouble at times.  Troponin 26, 26 w/ BNP of 630. ECG reviewed.  Artifactual baseline but probable ST with frequent PVCs.  No significant ST changes.  Past Medical History   Past Medical History:   Diagnosis Date  . Atrial fibrillation (HCC)   . Coronary artery disease    stenting x 2  . Diabetes (HCC)   . Dyslipidemia 04/05/2020  . Hypertension   . Morbid obesity (HCC)   . Pacemaker   . Paroxysmal atrial fibrillation (HCC) 11/06/2019  . Secondary hypercoagulable state (HCC) 11/06/2019  . T2DM (type 2 diabetes mellitus) (HCC) 04/05/2020  . Tachycardia-bradycardia syndrome (HCC) 03/17/2020    History reviewed. No pertinent surgical history.   No Known Allergies Inpatient Medications    . insulin aspart  0-9 Units Subcutaneous Q4H  . levothyroxine  50 mcg Oral QAC breakfast  . metoprolol succinate  12.5 mg Oral Daily  . pravastatin  80 mg Oral Daily  . vitamin B-12  1,000 mcg Oral Daily    Family History    Family History  Problem Relation Age of Onset  . Hypertension Mother   . Stroke Mother        died at age 29  . CAD Father        MI at age of 41   She indicated that her mother is deceased. She indicated that her father is deceased. She indicated that her sister is alive.   Social History    Social History   Socioeconomic History  . Marital status: Widowed    Spouse name: Not on file  . Number of children: Not on file  . Years of education: Not on file  . Highest education level: Not on file  Occupational History  . Not on file  Tobacco Use  . Smoking status: Never Smoker  . Smokeless tobacco: Never Used  Vaping Use  . Vaping Use: Never used  Substance and Sexual Activity  . Alcohol use: Not Currently  . Drug use: Never  . Sexual activity: Not on file  Other Topics Concern  . Not on file  Social History Narrative  . Not on file   Social Determinants of Health   Financial Resource Strain:   . Difficulty of Paying Living Expenses: Not on file  Food Insecurity:   . Worried About Programme researcher, broadcasting/film/video in the Last Year: Not on file  . Ran Out of Food in the Last Year: Not on file  Transportation Needs:   . Lack of Transportation (Medical): Not  on file  . Lack of Transportation (Non-Medical): Not on file  Physical Activity:   . Days of Exercise per Week: Not on file  . Minutes of Exercise per Session: Not on file  Stress:   . Feeling of Stress : Not on file  Social Connections:   . Frequency of Communication with Friends and Family: Not on file  . Frequency of Social Gatherings with Friends and Family: Not on file  . Attends Religious Services: Not on file  . Active Member of Clubs or Organizations: Not on file  . Attends Banker Meetings: Not on file  . Marital Status: Not on file  Intimate Partner Violence:   . Fear of Current or Ex-Partner: Not on file  . Emotionally Abused: Not on file  . Physically Abused: Not on file  . Sexually Abused: Not on file     Review of Systems    General:  No chills, fever, night sweats or weight changes.  Cardiovascular:  No chest pain, dyspnea on exertion, edema, orthopnea, palpitations, paroxysmal nocturnal dyspnea. Dermatological: No rash, lesions/masses Respiratory: No cough, dyspnea Urologic: No hematuria, dysuria Abdominal:   No nausea, vomiting, diarrhea, bright red blood per rectum, melena, or hematemesis Neurologic:  No visual changes, wkns, changes in mental status. All other systems reviewed and are otherwise negative except as noted above.  Physical Exam    Blood pressure 110/76, pulse (!) 58, temperature 97.6 F (36.4 C), temperature source Oral, resp. rate 19, SpO2 99 %.     Intake/Output Summary (Last 24 hours) at 10/31/2020 0307 Last data filed at 10/31/2020 0136 Gross per 24 hour  Intake 100 ml  Output --  Net 100 ml   Wt Readings from Last 3 Encounters:  10/30/20 (!) 142.4 kg  10/23/20 (!) 142.7 kg  07/29/20 136.1 kg    CONSTITUTIONAL: alert and conversant, well-appearing, nourished, no distress HEENT: oropharynx clear and moist, no mucosal lesions, normal dentition, conjunctiva normal, EOM intact, pupils equal, no lid lag. NECK: supple, no  cervical adenopathy, no thyromegaly CARDIOVASCULAR: Regular rhythm. No gallop, murmur, or rub. Normal S1/S2. Radial pulses intact. JVP >12 cm H20. No carotid bruits. PULMONARY/CHEST WALL: no deformities, normal breath sounds bilaterally, normal work of breathing ABDOMINAL: soft, non-tender, non-distended EXTREMITIES: 2+ BLE edema, warm and well-perfused SKIN: Dry and intact without apparent rashes or wounds. NEUROLOGIC: alert, normal gait, no abnormal movements, cranial nerves grossly intact.   Labs    Troponin (Point of Care Test) No results for input(s): TROPIPOC in the last 72 hours. No results for input(s): CKTOTAL, CKMB, TROPONINI in the last 72 hours. Lab Results  Component Value Date   WBC 9.6 10/30/2020   HGB 8.2 (L) 10/30/2020   HCT 26.7 (L) 10/30/2020  MCV 97.4 10/30/2020   PLT 399 10/30/2020    Recent Labs  Lab 10/30/20 2027  NA 137  K 3.6  CL 100  CO2 23  BUN 39*  CREATININE 1.33*  CALCIUM 9.3  PROT 6.8  BILITOT 0.7  ALKPHOS 156*  ALT 50*  AST 81*  GLUCOSE 288*   No results found for: CHOL, HDL, LDLCALC, TRIG No results found for: DDIMER   Radiology Studies    DG Chest 2 View  Result Date: 10/30/2020 CLINICAL DATA:  Cough EXAM: CHEST - 2 VIEW COMPARISON:  April 2021 FINDINGS: Mild interstitial prominence appears chronic. No pleural effusion. Mild cardiomegaly. Left chest wall dual lead pacemaker. No acute osseous abnormality. IMPRESSION: Chronic interstitial changes. Electronically Signed   By: Guadlupe Spanish M.D.   On: 10/30/2020 16:11    ECG & Cardiac Imaging    TTE 04/06/20 w/ LVEF 50%, indeterminate diastology, RV mildly enlarged w/ RVSP 40 mm Hg, mild LA dilation, mild MR, collapsible IVC  10/2017 moview w/ EF 60%, infero-apical defect, no ischemia.  Unclear anatomy, seen Dr. Jacques Navy 2021 w/ most recent stenting ~ 5 years ago.  Assessment & Plan    86 year old female with history of pAF, high C2V risk, coroanry artery disease w/ prior PCI  (unclear anatomy), probable HFpEF admitted with acute anemia and suspected gastrointestinal bleed in the setting of apixaban.  She has high annual stroke risk of around 9% but brief discontinuation of anticoagulation while managing gastrointestinal bleeding is reasonable and I reviewed her daily stroke risk in light of this with the patient.  She is agreeable.  Suspect that her exertional dyspnea is likely multifactorial given frailty, multiple comorbidities with worsening anemia of late.  Given low normal LVEF and comorbidities I do suspect a component of HFpEF and she appears to have elevated filling pressures by exam, but would hold on diuresis until status of blood loss is clarified.  Recommend prompt reinitiation of anticoagulation once bleeding question is resolved.  Problem list Acute anemia, suspect blood loss Paroxysmal AF, rate control strategy, C2V risk 6 Shortness of breath, chest pressure Frequent PVCs Coronary artery disease s/p PCI HFpEF Morbid obesity Obstructive sleep apnea  Recommendations #pAF, rate control strategy, C2V risk 6 - Continue dilitazem 120 mg daily - Continue metoprolol succinate 25 mg daily - Hold apixaban for now.  Appropriate for apixaban 5 mg BID for now but, if SCr > 1.5 then recommend reduced dose @ 2.5 mg BID  #HFpEF #Shortness of breath, chest pressure - Hold diuretics until bleeding status clarified. - If persistent exertional dyspnea, chest pressure despite correction of anemia and diuresis could consider functional testing.  #Hx CAD - Continue high intensity statin, check lipids, add ezetimibe if LDL >70 - Remote history of PCI w/ precise-DAPT score 72, high risk for minor/major bleed.  Without clear indication for P2Y12 would recommend stopping plavix indefinitely.  Signed, Regino Schultze, MD 10/31/2020, 3:07 AM  For questions or updates, please contact   Please consult [email or web address] for contact info under Cardiology/STEMI.

## 2020-10-31 NOTE — H&P (Addendum)
History and Physical  Kirsten HomansCarol Jensen ZOX:[id number] DOB: 09/11/1932 DOA: 10/30/2020  Referring physician: Lilian KapurMcDonald, PA, EDP PCP: Melida QuitterWile, Laura H, MD  Outpatient Specialists: Cardiology. Patient coming from: Home through A. fib clinic. Chief Complaint: Abnormal labs hemoglobin 8K with baseline of 11K.  HPI: Kirsten HomansCarol Jensen is a 86 y.o. female with medical history significant for permanent A. fib on Eliquis, tachybradycardia status post pacemaker, essential hypertension, type 2 diabetes, OSA on CPAP, who presented [facility] ED as referred by provider at A[facility] fib clinic due to abnormal lab with a drop of hemoglobin, down to 8k from 11k at baseline. In the ED she had a positive FOBT and elevated BUN.  Due to concern for upper GI bleed, she was started on Protonix drip.  She has no prior history of GI bleed.  She reports having an occasional nosebleed, last one was about a week ago.  She has also reported a 14 pound weight gain in the last week and intermittent sudden onset discomfort in her chest since August 2021.  Work-up in the ED revealed presumed upper GI bleed, exacerbation of chronic diastolic CHF, mildly elevated troponin likely secondary to demand ischemia from anemia.  EDP contacted GI and cardiology.  Okay to hold Eliquis per cardiology.  TRH, hospitalist team, asked to admit.  ED Course: Blood pressure stable, maintaining MAP greater than 65, heart rate mildly elevated 101, respiration rate 29. Lab studies remarkable for hemoglobin 8.2K with baseline of 11K, positive FOBT, serum glucose 288, creatinine 1.3 with baseline of 1.0, mildly elevated troponin at 26, BNP greater than 600.  Review of Systems: Review of systems as noted in the HPI. All other systems reviewed and are negative.   Past Medical History:  Diagnosis Date  . Atrial fibrillation (HCC)   . Coronary artery disease    stenting x 2  . Diabetes (HCC)   . Dyslipidemia 04/05/2020  . Hypertension   . Morbid obesity (HCC)   . Pacemaker    . Paroxysmal atrial fibrillation (HCC) 11/06/2019  . Secondary hypercoagulable state (HCC) 11/06/2019  . T2DM (type 2 diabetes mellitus) (HCC) 04/05/2020  . 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 No pertinent surgical history.  Social History:  reports that she has never smoked. She has never used smokeless tobacco. She reports previous alcohol use. She reports that she does not use drugs.   No Known Allergies  Family History  Problem Relation Age of Onset  . Hypertension Mother   . Stroke Mother        died at age 84  . CAD Father        MI at age of 84      Prior to Admission medications   Medication Sig Start Date End Date Taking? Authorizing Provider  acetaminophen (TYLENOL) 500 MG tablet Take 500 mg by mouth 2 (two) times daily as needed for mild pain.     [provider]  acetaminophen-codeine (TYLENOL #4) 300-60 MG tablet Take 1 tablet by mouth 3 (three) times daily as needed for pain.     [provider]  apixaban (ELIQUIS) 5 MG TABS tablet Take 5 mg by mouth 2 (two) times daily.    [provider]  benzonatate (TESSALON) 100 MG capsule Take 100 mg by mouth 3 (three) times daily as needed. 10/17/20   [provider]  buPROPion (WELLBUTRIN) 100 MG tablet Take 100 mg by mouth daily.    [provider]  cefdinir (OMNICEF) 300 MG capsule Take 300  mg by mouth 2 (two) times daily. 10/17/20   [provider]  Cholecalciferol (VITAMIN D-3) 25 MCG (1000 UT) CAPS Take 1 capsule by mouth daily.    [provider]  clopidogrel (PLAVIX) 75 MG tablet Take 75 mg by mouth daily.    [provider]  diltiazem (CARDIZEM LA) 120 MG 24 hr tablet Take 120 mg by mouth daily.    [provider]  gemfibrozil (LOPID) 600 MG tablet Take 600 mg by mouth 2 (two) times daily before a meal.    [provider]  glipiZIDE (GLUCOTROL XL) 2.5 MG 24 hr tablet Take 2.5 mg by mouth daily with  breakfast.    [provider]  ketoconazole (NIZORAL) 2 % shampoo Apply 1 application topically daily as needed for irritation. Let sit for 3 minutes before rinsing.    [provider]  levothyroxine (SYNTHROID) 50 MCG tablet Take 50 mcg by mouth daily before breakfast.    [provider]  magnesium oxide (MAG-OX) 400 MG tablet Take 1 tablet (400 mg total) by mouth daily. 02/05/20   Fenton, Clint R, PA  metFORMIN (GLUCOPHAGE-XR) 500 MG 24 hr tablet Take 1,000 mg by mouth 2 (two) times daily.    [provider]  metoprolol succinate (TOPROL XL) 25 MG 24 hr tablet Take 1 tablet (25 mg total) by mouth daily. 10/23/20 10/23/21  Fenton, Clint R, PA  Multiple Vitamins-Minerals (OCUVITE ADULT 50+ PO) Take 1 capsule by mouth daily.    [provider]  pravastatin (PRAVACHOL) 80 MG tablet Take 80 mg by mouth daily.    [provider]  ramipril (ALTACE) 5 MG capsule Take 5 mg by mouth daily.    [provider]  torsemide (DEMADEX) 20 MG tablet Take 1 tablet (20 mg total) by mouth daily. 10/18/19   Duke Salvia, MD  vitamin B-12 (CYANOCOBALAMIN) 1000 MCG tablet Take 1,000 mcg by mouth daily.    [provider]    Physical Exam: BP 128/72   Pulse 94   Temp (!) 97.5 F (36.4 C) (Oral)   Resp (!) 22   LMP  (LMP Unknown)   SpO2 99%   . General: 86 y.o. year-old female well developed well nourished in no acute distress.  Alert and oriented x3. . Cardiovascular: Regular rate and rhythm with no rubs or gallops.  No thyromegaly or JVD noted.  1+ pitting edema in lower extremities bilaterally. 2/4 pulses in all 4 extremities. Marland Kitchen Respiratory: Clear to auscultation with no wheezes or rales. Good inspiratory effort. . Abdomen: Soft nontender nondistended with normal bowel sounds x4 quadrants. . Muskuloskeletal: No cyanosis or clubbing.  1+ pitting edema in lower extremities bilaterally.   . Neuro: CN II-XII intact, strength, sensation,  reflexes . Skin: No ulcerative lesions noted or rashes . Psychiatry: Judgement and insight appear normal. Mood is appropriate for condition and setting          Labs on Admission:  Basic Metabolic Panel: Recent Labs  Lab 10/30/20 1505 10/30/20 2027  NA 136 137  K 3.9 3.6  CL 99 100  CO2 23 23  GLUCOSE 319* 288*  BUN 40* 39*  CREATININE 1.32* 1.33*  CALCIUM 9.3 9.3   Liver Function Tests: Recent Labs  Lab 10/30/20 2027  AST 81*  ALT 50*  ALKPHOS 156*  BILITOT 0.7  PROT 6.8  ALBUMIN 3.2*   No results for input(s): LIPASE, AMYLASE in the last 168 hours. No results for input(s): AMMONIA in the  last 168 hours. CBC: Recent Labs  Lab 10/30/20 1505 10/30/20 2027  WBC 10.8* 9.6  HGB 8.0* 8.2*  HCT 26.7* 26.7*  MCV 96.4 97.4  PLT 382 399   Cardiac Enzymes: No results for input(s): CKTOTAL, CKMB, CKMBINDEX, TROPONINI in the last 168 hours.  BNP (last 3 results) Recent Labs    10/30/20 1505  BNP 628.9*    ProBNP (last 3 results) No results for input(s): PROBNP in the last 8760 hours.  CBG: No results for input(s): GLUCAP in the last 168 hours.  Radiological Exams on Admission: DG Chest 2 View  Result Date: 10/30/2020 CLINICAL DATA:  Cough EXAM: CHEST - 2 VIEW COMPARISON:  April 2021 FINDINGS: Mild interstitial prominence appears chronic. No pleural effusion. Mild cardiomegaly. Left chest wall dual lead pacemaker. No acute osseous abnormality. IMPRESSION: Chronic interstitial changes. Electronically Signed   By: Guadlupe Spanish M.D.   On: 10/30/2020 16:11    EKG: I independently viewed the EKG done and my findings are as followed: Sinus tachycardia rate of 105, ventricular paced.    Assessment/Plan Present on Admission: . Upper GI bleed  Active Problems:   Upper GI bleed  Presumed upper GI bleed Referred to by A. fib clinic due to drop of hemoglobin 8K from 11K at baseline Positive FOBT in the ED She was started on Protonix drip Hold Eliquis, per  cardiology Repeat H&H every 6 hours Obtain iron studies Transfuse hemoglobin less than 7.0 or less than 8 if symptomatic GI consult in the morning N.p.o. until seen by GI. Protonix drip started in the ED, continue  Permanent A. fib on Eliquis Eliquis is currently on hold due to presumed upper GI bleed Recommend cardiology and GI evaluation for guidance regarding future anticoagulation Patient has a CHA2DS2-VASc score of 6 Resume home oral low-dose beta-blocker for rate control to avoid withdrawal Continue to monitor on telemetry  Acute on chronic diastolic CHF Presented with elevated BNP greater than 600 and evidence of volume overload on exam She is on torsemide at home 20 mg daily Received a dose of IV Lasix in the ED Hold off diuretics for now to maintain MAP greater than 65 in the setting of possible active GI bleed Start strict I's and O's and daily weight Obtain 2D echo  AKI on CKD 3A Baseline creatinine appears to be 1.0 with GFR of 56 Presented with creatinine of 1.3 with GFR of 39 Avoid nephrotoxins Monitor urine output Repeat renal panel  Elevated troponin suspect demand ischemia in the setting of anemia Troponin S 26 Trend troponin  Coronary artery disease status post PCI x2 Resume Plavix if okay with GI. Continue home Pravachol  Essential hypertension  Hold off home oral antihypertensives in the setting of suspected GI bleed We will restart Toprol-XL at lower dose 12.5 mg daily to avoid beta-blocker withdrawal. Maintain MAP greater than 65 Closely monitor vital signs  Hypothyroidism Obtain TSH Resume home levothyroxine  Type 2 diabetes with hyperglycemia Hold off oral hypoglycemics Obtain A1c Start insulin sliding scale  Severe morbid obesity BMI 49 Management per PCP  OSA CPAP at night        DVT prophylaxis: SCDs  Code Status: Full code as stated by the patient herself.  Family Communication: None at bedside.  Disposition Plan:  Admit to telemetry medical.  Consults called: EDP contacted GI and cardiology.    Admission status: Inpatient status.   Status is: Inpatient    Dispo:  Patient From: Home  Planned Disposition: Home  Expected discharge date: 11/03/20  Medically stable for discharge: No, ongoing management of presumed upper GI bleed, acute on chronic diastolic CHF, elevated troponin.Darlin Drop MD [facility] Pager [phone]  If 7PM-7AM, please contact night-coverage [email or web address] Password [facility]  10/31/2020, 12:49 AM

## 2020-10-31 NOTE — Plan of Care (Signed)
  Problem: Education: Goal: Knowledge of General Education information will improve Description: Including pain rating scale, medication(s)/side effects and non-pharmacologic comfort measures Outcome: Progressing   Problem: Clinical Measurements: Goal: Ability to maintain clinical measurements within normal limits will improve Outcome: Progressing Goal: Respiratory complications will improve Outcome: Progressing Goal: Cardiovascular complication will be avoided Outcome: Progressing   Problem: Activity: Goal: Risk for activity intolerance will decrease Outcome: Progressing   Problem: Elimination: Goal: Will not experience complications related to urinary retention Outcome: Progressing   Problem: Safety: Goal: Ability to remain free from injury will improve Outcome: Progressing   Problem: Skin Integrity: Goal: Risk for impaired skin integrity will decrease Outcome: Progressing

## 2020-10-31 NOTE — Progress Notes (Addendum)
11/19 Patient is in failure to sense notified by CCMD, Harrell Gave. Pt is alert and stable.RN will continue to monitor patient for any change in status.  11/19 0430 Pt has unrelieved cough. Linton Flemings, NP notified.  11/19 0507 Pt refused CPAP. Pt educated and encouraged but refused.

## 2020-10-31 NOTE — Progress Notes (Signed)
Primary cardiologist:  Klein/Acharya   Subjective:  Denies SSCP, palpitations or Dyspnea   Objective:  Vitals:   10/31/20 0200 10/31/20 0248 10/31/20 0325 10/31/20 0812  BP: 115/76 110/76  134/78  Pulse: 81 (!) 58  92  Resp: 20 19  16   Temp:  97.6 F (36.4 C)  98.7 F (37.1 C)  TempSrc:  Oral  Oral  SpO2: 100% 99%  100%  Weight:   (!) 144.6 kg   Height:   5\' 7"  (1.702 m)     Intake/Output from previous day:  Intake/Output Summary (Last 24 hours) at 10/31/2020 0947 Last data filed at 10/31/2020 0840 Gross per 24 hour  Intake 100.67 ml  Output 400 ml  Net -299.33 ml    Physical Exam: Overweight white female Blind macular degeneration Lungs clear No murmur  Trace edeam Pale  Pacer under left clavicle   Lab Results: Basic Metabolic Panel: Recent Labs    10/30/20 2027 10/31/20 0258  NA 137 138  K 3.6 3.6  CL 100 100  CO2 23 26  GLUCOSE 288* 246*  BUN 39* 37*  CREATININE 1.33* 1.25*  CALCIUM 9.3 9.1   Liver Function Tests: Recent Labs    10/30/20 2027 10/31/20 0258  AST 81* 71*  ALT 50* 47*  ALKPHOS 156* 155*  BILITOT 0.7 0.7  PROT 6.8 6.4*  ALBUMIN 3.2* 3.1*   No results for input(s): LIPASE, AMYLASE in the last 72 hours. CBC: Recent Labs    10/30/20 2027 10/31/20 0258  WBC 9.6 9.2  NEUTROABS  --  7.2  HGB 8.2* 8.1*  HCT 26.7* 25.9*  MCV 97.4 96.3  PLT 399 374   Hemoglobin A1C: Recent Labs    10/31/20 0258  HGBA1C 7.9*   Fasting Lipid Panel: No results for input(s): CHOL, HDL, LDLCALC, TRIG, CHOLHDL, LDLDIRECT in the last 72 hours. Thyroid Function Tests: Recent Labs    10/31/20 0258  TSH 1.050   Anemia Panel: Recent Labs    10/31/20 0258  VITAMINB12 1,164*  FOLATE 6.6  FERRITIN 25  TIBC 434  IRON 23*    Imaging: DG Chest 2 View  Result Date: 10/30/2020 CLINICAL DATA:  Cough EXAM: CHEST - 2 VIEW COMPARISON:  April 2021 FINDINGS: Mild interstitial prominence appears chronic. No pleural effusion. Mild  cardiomegaly. Left chest wall dual lead pacemaker. No acute osseous abnormality. IMPRESSION: Chronic interstitial changes. Electronically Signed   By: 11/01/2020 M.D.   On: 10/30/2020 16:11    Cardiac Studies:  ECG: afib intermittent pacing    Telemetry: afib intermittent pacing   Echo:   Medications:   . chlorhexidine  15 mL Mouth Rinse BID  . insulin aspart  0-9 Units Subcutaneous Q4H  . levothyroxine  50 mcg Oral QAC breakfast  . mouth rinse  15 mL Mouth Rinse q12n4p  . metoprolol succinate  12.5 mg Oral Daily  . pravastatin  80 mg Oral Daily  . vitamin B-12  1,000 mcg Oral Daily     . pantoprozole (PROTONIX) infusion 8 mg/hr (10/31/20 0132)    Assessment/Plan:   1. CAD:  Prior stenting in [place] ? 5 years ago Currently no chest pain Plavix held due to GI bleeding echo done  04/06/20 with EF 50% no definite RWMA;s Troponin not elevated 25 ECG with afib pacing no acute changes  Should be on Toprol for rate control increase to 25 mg daily. Add back plavix after GI / anemia issues sorted  2. PPM:  Normal function f/u  Klein  3. Afib:  See above regarding rate control hold eliquis in setting of anemia ? GI bleed.    4. HLD continue statin   Ok to proceed with GI w/u including EGD and colonoscopy   Charlton Haws 10/31/2020, 9:47 AM

## 2020-10-31 NOTE — ED Notes (Signed)
Pt admitted to 5N15; report called to [place], RN.

## 2020-10-31 NOTE — Procedures (Signed)
Patient does not want echo at this time 

## 2020-10-31 NOTE — Consult Note (Signed)
[facility] Primary Care Physician:  Melida QuitterWile, Laura H, MD Primary Gastroenterologist: Gentry FitzUnassigned  Reason for Consultation: Iron deficiency anemia, heme positive stools  HPI: Kirsten HomansCarol Jensen is a 86 y.o. female with past medical history noted below to include A. fib (on Eliquis), CAD s/p stenting, pacemaker presenting for consultation of anemia and heme positive stools.  Patient presented to the ED after lab work revealed hemoglobin of 8.0, decreased from baseline of 11.6.  Patient reports over the past couple of days, she has had some nausea and had 2 episodes of nonbloody emesis 2 days ago.  She denies any abdominal pain, GERD, dysphagia, changes in appetite, unexplained weight loss, changes in stool, melena, or hematochezia.  She has had some shortness of breath over the last couple of weeks, worse with exertion.  She is on Eliquis, last dose yesterday.  Denies aspirin or NSAID use.  States she has a history of colon polyps with last colon[place] over [place] years ago [place].  Denies any family history of colon cancer or gastrointestinal malignancy.  Past Medical History:  Diagnosis Date  . Atrial fibrillation (HCC)   . Coronary artery disease    stenting x 2  . Diabetes (HCC)   . Dyslipidemia 04/05/2020  . Hypertension   . Morbid obesity (HCC)   . Pacemaker   . Paroxysmal atrial fibrillation (HCC) 11/06/2019  . Secondary hypercoagulable state (HCC) 11/06/2019  . T2DM (type 2 diabetes mellitus) (HCC) 04/05/2020  . Tachycardia-bradycardia syndrome (HCC) 03/17/2020    History reviewed. No pertinent surgical history.  Prior to Admission medications   Medication Sig Start Date End Date Taking? Authorizing Provider  acetaminophen (TYLENOL) 500 MG tablet Take 500 mg by mouth 2 (two) times daily as needed for mild pain.     [provider]  acetaminophen-codeine (TYLENOL #4) 300-60 MG tablet Take 1 tablet by mouth 3 (three) times daily as needed for pain.     [provider]  apixaban (ELIQUIS) 5 MG TABS tablet Take 5 mg by mouth 2 (two) times daily.    [provider]  benzonatate (TESSALON) 100 MG capsule Take 100 mg by mouth 3 (three) times daily as needed. 10/17/20   [provider]  buPROPion (WELLBUTRIN) 100 MG tablet Take 100 mg by mouth daily.    [provider]  cefdinir (OMNICEF) 300 MG capsule Take 300 mg by mouth 2 (two) times daily. 10/17/20   [provider]  Cholecalciferol (VITAMIN D-3) 25 MCG (1000 UT) CAPS Take 1 capsule by mouth daily.    [provider]  clopidogrel (PLAVIX) 75 MG tablet Take 75 mg by mouth daily.    [provider]  diltiazem (CARDIZEM LA) 120 MG 24 hr tablet Take 120 mg by mouth daily.    [provider]  gemfibrozil (LOPID) 600 MG tablet Take 600 mg by mouth 2 (two) times daily before a meal.    [provider]  glipiZIDE (GLUCOTROL XL) 2.5 MG 24 hr tablet Take 2.5 mg by mouth daily with breakfast.    [provider]  ketoconazole (NIZORAL) 2 % shampoo Apply 1 application topically daily as needed for irritation. Let sit for 3 minutes before rinsing.    [provider]  levothyroxine (SYNTHROID) 50 MCG tablet Take 50 mcg by mouth daily before breakfast.    [provider]  magnesium oxide (MAG-OX) 400 MG tablet Take 1 tablet (400 mg total) by mouth daily. 02/05/20   Fenton, Laural Beneslint R, PA  metFORMIN (GLUCOPHAGE-XR) 500 MG 24 hr tablet Take 1,000 mg by mouth 2 (two) times daily.    [provider]  metoprolol succinate (TOPROL XL) 25 MG 24 hr tablet Take 1 tablet (25 mg total) by mouth daily. 10/23/20 10/23/21  Fenton, Clint R, PA  Multiple Vitamins-Minerals (OCUVITE ADULT 50+ PO) Take 1 capsule by mouth daily.    [provider]  pravastatin (PRAVACHOL) 80 MG tablet Take 80 mg by mouth daily.    [provider]  ramipril (ALTACE) 5 MG capsule Take 5 mg by mouth daily.    [provider]   torsemide (DEMADEX) 20 MG tablet Take 1 tablet (20 mg total) by mouth daily. 10/18/19   Duke Salvia, MD  vitamin B-12 (CYANOCOBALAMIN) 1000 MCG tablet Take 1,000 mcg by mouth daily.    [provider]    Current Facility-Administered Medications  Medication Dose Route Frequency Provider Last Rate Last Admin  . benzonatate (TESSALON) capsule 200 mg  200 mg Oral TID PRN Audrea Muscat T, NP   200 mg at 10/31/20 0504  . chlorhexidine (PERIDEX) 0.12 % solution 15 mL  15 mL Mouth Rinse BID Hall, Carole N, DO      . insulin aspart (novoLOG) injection 0-9 Units  0-9 Units Subcutaneous Q4H Abaigeal, Moomaw N, DO   2 Units at 10/31/20 0417  . levothyroxine (SYNTHROID) tablet 50 mcg  50 mcg Oral QAC breakfast Dow Adolph N, DO      . MEDLINE mouth rinse  15 mL Mouth Rinse q12n4p Hall, Carole N, DO      . metoprolol succinate (TOPROL-XL) 24 hr tablet 12.5 mg  12.5 mg Oral Daily Hall, Carole N, DO      . pantoprazole (PROTONIX) 80 mg in sodium chloride 0.9 % 100 mL (0.8 mg/mL) infusion  8 mg/hr Intravenous Continuous McDonald, Mia A, PA-C 10 mL/hr at 10/31/20 0132 8 mg/hr at 10/31/20 0132  . pravastatin (PRAVACHOL) tablet 80 mg  80 mg Oral Daily Hall, Carole N, DO      . vitamin B-12 (CYANOCOBALAMIN) tablet 1,000 mcg  1,000 mcg Oral Daily Dow Adolph N, DO        Allergies as of 10/30/2020  . (No Known Allergies)    Family History  Problem Relation Age of Onset  . Hypertension Mother   . Stroke Mother        died at age 79  . CAD Father        MI at age of 75    Social History   Socioeconomic History  . Marital status: Widowed    Spouse name: Not on file  . Number of children: Not on file  . Years of education: Not on file  . Highest education level: Not on file  Occupational History  . Not on file  Tobacco Use  . Smoking status: Never Smoker  . Smokeless tobacco: Never Used  Vaping Use  . Vaping Use: Never used  Substance and Sexual Activity  . Alcohol use: Not Currently   . Drug use: Never  . Sexual activity: Not on file  Other Topics Concern  . Not on file  Social History Narrative  . Not on file   Social Determinants of Health   Financial Resource Strain:   . Difficulty of Paying Living Expenses: Not on file  Food Insecurity:   . Worried About Programme researcher, broadcasting/film/video in the Last Year: Not on file  . Ran Out of Food in the Last  Year: Not on file  Transportation Needs:   . Lack of Transportation (Medical): Not on file  . Lack of Transportation (Non-Medical): Not on file  Physical Activity:   . Days of Exercise per Week: Not on file  . Minutes of Exercise per Session: Not on file  Stress:   . Feeling of Stress : Not on file  Social Connections:   . Frequency of Communication with Friends and Family: Not on file  . Frequency of Social Gatherings with Friends and Family: Not on file  . Attends Religious Services: Not on file  . Active Member of Clubs or Organizations: Not on file  . Attends Banker Meetings: Not on file  . Marital Status: Not on file  Intimate Partner Violence:   . Fear of Current or Ex-Partner: Not on file  . Emotionally Abused: Not on file  . Physically Abused: Not on file  . Sexually Abused: Not on file    Review of Systems: Review of Systems  Constitutional: Negative for chills and fever.  HENT: Negative for hearing loss and tinnitus.   Eyes: Negative for pain and redness.  Respiratory: Positive for shortness of breath. Negative for cough.   Cardiovascular: Negative for chest pain and palpitations.  Gastrointestinal: Positive for nausea and vomiting. Negative for abdominal pain, blood in stool, constipation, diarrhea, heartburn and melena.  Genitourinary: Negative for flank pain and hematuria.  Musculoskeletal: Negative for falls and joint pain.  Skin: Negative for itching and rash.  Neurological: Negative for seizures and loss of consciousness.  Endo/Heme/Allergies: Negative for polydipsia. Does not  bruise/bleed easily.  Psychiatric/Behavioral: Negative for substance abuse. The patient is not nervous/anxious.     Physical Exam: Vital signs in last 24 hours: Temp:  [97.5 F (36.4 C)-98.7 F (37.1 C)] 98.7 F (37.1 C) (11/19 0812) Pulse Rate:  [58-101] 92 (11/19 0812) Resp:  [16-29] 16 (11/19 0812) BP: (110-137)/(57-93) 134/78 (11/19 0812) SpO2:  [95 %-100 %] 100 % (11/19 0812) Weight:  [142.4 kg-144.6 kg] 144.6 kg (11/19 0325) Last BM Date:  (Pt stated doesn't remember)  Physical Exam Vitals reviewed.  Constitutional:      General: She is not in acute distress.    Appearance: She is obese.  HENT:     Head: Normocephalic and atraumatic.     Nose: Nose normal. No congestion.     Mouth/Throat:     Mouth: Mucous membranes are moist.     Pharynx: Oropharynx is clear.  Eyes:     General: No scleral icterus.    Extraocular Movements: Extraocular movements intact.     Conjunctiva/sclera: Conjunctivae normal.  Cardiovascular:     Rate and Rhythm: Normal rate and regular rhythm.     Pulses: Normal pulses.  Pulmonary:     Effort: Pulmonary effort is normal. No respiratory distress.     Breath sounds: Normal breath sounds.  Abdominal:     General: Bowel sounds are normal. There is no distension.     Palpations: Abdomen is soft. There is no mass.     Tenderness: There is no abdominal tenderness. There is no guarding or rebound.     Hernia: No hernia is present.  Musculoskeletal:        General: No swelling or tenderness.     Cervical back: Normal range of motion and neck supple.  Skin:    General: Skin is warm and dry.  Neurological:     General: No focal deficit present.     Mental Status:  She is alert and oriented to person, place, and time.  Psychiatric:        Mood and Affect: Mood normal.        Behavior: Behavior normal.      Intake/Output from previous day: 11/18 0701 - 11/19 0700 In: 100.7 [I.V.:0.7; IV Piggyback:100] Out: -  Intake/Output this shift: No  intake/output data recorded.  Lab Results: Recent Labs    10/30/20 1505 10/30/20 2027 10/31/20 0258  WBC 10.8* 9.6 9.2  HGB 8.0* 8.2* 8.1*  HCT 26.7* 26.7* 25.9*  PLT 382 399 374   BMET Recent Labs    10/30/20 1505 10/30/20 2027 10/31/20 0258  NA 136 137 138  K 3.9 3.6 3.6  CL 99 100 100  CO2 23 23 26   GLUCOSE 319* 288* 246*  BUN 40* 39* 37*  CREATININE 1.32* 1.33* 1.25*  CALCIUM 9.3 9.3 9.1   LFT Recent Labs    10/31/20 0258  PROT 6.4*  ALBUMIN 3.1*  AST 71*  ALT 47*  ALKPHOS 155*  BILITOT 0.7   PT/INR Recent Labs    10/31/20 0041  LABPROT 21.5*  INR 2.0*    Studies/Results: DG Chest 2 View  Result Date: 10/30/2020 CLINICAL DATA:  Cough EXAM: CHEST - 2 VIEW COMPARISON:  April 2021 FINDINGS: Mild interstitial prominence appears chronic. No pleural effusion. Mild cardiomegaly. Left chest wall dual lead pacemaker. No acute osseous abnormality. IMPRESSION: Chronic interstitial changes. Electronically Signed   By: May 2021 M.D.   On: 10/30/2020 16:11    Impression: Anemia with heme positive stools in the setting of Eliquis use.  No frank GI bleeding. -Hemoglobin 8.1 today, decreased from 11.6 in 03/2020 -BUN 37 increased from baseline of 22 on 10/23/20, though no signs of upper GI bleeding at this time -INR 2.0  A. fib, on Eliquis, last dose 10/30/2020  Acute on chronic CHF, CAD s/p stenting, pacemaker  CKD  Plan: EGD and colonoscopy tomorrow, OK to proceed per cardiology team.  I thoroughly discussed procedure with the patient to include nature, alternatives, benefits, and risks (including but not limited to bleeding, infection, perforation, anesthesia/cardiac and pulmonary complications).  Patient verbalized understanding and gave verbal consent to proceed with EGD and colonoscopy tomorrow.  Continue to hold Eliquis.  Protonix 40 mg IV twice daily.  Continue to monitor H&H with transfusion as needed to maintain hemoglobin greater than  7.  [facility] will follow.   LOS: 0 days   11/01/2020  10/31/2020, 8:24 AM    [phone]

## 2020-10-31 NOTE — Care Plan (Signed)
This 86 years old female with medical history significant for permanent A. fib on Eliquis, tachybradycardia syndrome status post pacemaker, essential hypertension, type 2 diabetes, obstructive sleep apnea on CPAP presented in the ED after she was sent by her provider at [facility]. fib clinic due to abnormal labs.  Her hemoglobin dropped from 11 to 8 g.  She also had a positive FOBT in the ED.   Patient was admitted for possible upper GI bleed,  started on Protonix drip.  She denies any prior history of GI bleed.  Patient also reports 14 pound weight gain associated with chest discomfort.  Eliquis was kept on hold.  Cardiology consulted recommended to continue metoprolol and proceed with EGD and colonoscopy.  Gastroenterology consulted, scheduled for EGD and colonoscopy on 11/ 20. Patient was seen and examined in the morning.

## 2020-10-31 NOTE — Progress Notes (Signed)
  Echocardiogram 2D Echocardiogram has been performed.  Delcie Roch 10/31/2020, 11:54 AM

## 2020-11-01 ENCOUNTER — Inpatient Hospital Stay (HOSPITAL_COMMUNITY): Payer: Medicare Other | Admitting: Anesthesiology

## 2020-11-01 ENCOUNTER — Encounter (HOSPITAL_COMMUNITY)
Admission: EM | Disposition: A | Discharge status: Other Acute Inpatient Hospital | Payer: Self-pay | Source: Home / Self Care | Attending: Family Medicine

## 2020-11-01 ENCOUNTER — Encounter (HOSPITAL_COMMUNITY): Payer: Self-pay | Admitting: Internal Medicine

## 2020-11-01 DIAGNOSIS — I482 Chronic atrial fibrillation, unspecified: Secondary | ICD-10-CM | POA: Diagnosis not present

## 2020-11-01 DIAGNOSIS — K922 Gastrointestinal hemorrhage, unspecified: Secondary | ICD-10-CM | POA: Diagnosis not present

## 2020-11-01 HISTORY — PX: ESOPHAGOGASTRODUODENOSCOPY: SHX5428

## 2020-11-01 LAB — PHOSPHORUS: Phosphorus: 2.4 mg/dL — ABNORMAL LOW (ref 2.5–4.6)

## 2020-11-01 LAB — SURGICAL PCR SCREEN
MRSA, PCR: NEGATIVE
Staphylococcus aureus: POSITIVE — AB

## 2020-11-01 LAB — GLUCOSE, CAPILLARY
Glucose-Capillary: 166 mg/dL — ABNORMAL HIGH (ref 70–99)
Glucose-Capillary: 161 mg/dL — ABNORMAL HIGH (ref 70–99)
Glucose-Capillary: 257 mg/dL — ABNORMAL HIGH (ref 70–99)
Glucose-Capillary: 161 mg/dL — ABNORMAL HIGH (ref 70–99)
Glucose-Capillary: 212 mg/dL — ABNORMAL HIGH (ref 70–99)

## 2020-11-01 LAB — HEMOGLOBIN AND HEMATOCRIT, BLOOD
HCT: 25.1 % — ABNORMAL LOW (ref 36.0–46.0)
HCT: 25.3 % — ABNORMAL LOW (ref 36.0–46.0)
HCT: 25.9 % — ABNORMAL LOW (ref 36.0–46.0)
Hemoglobin: 7.6 g/dL — ABNORMAL LOW (ref 12.0–15.0)
Hemoglobin: 7.8 g/dL — ABNORMAL LOW (ref 12.0–15.0)
Hemoglobin: 8 g/dL — ABNORMAL LOW (ref 12.0–15.0)

## 2020-11-01 LAB — BASIC METABOLIC PANEL
Anion gap: 11 (ref 5–15)
BUN: 26 mg/dL — ABNORMAL HIGH (ref 8–23)
CO2: 26 mmol/L (ref 22–32)
Calcium: 8.7 mg/dL — ABNORMAL LOW (ref 8.9–10.3)
Chloride: 100 mmol/L (ref 98–111)
Creatinine, Ser: 1.02 mg/dL — ABNORMAL HIGH (ref 0.44–1.00)
GFR, Estimated: 54 mL/min — ABNORMAL LOW (ref >60)
Glucose, Bld: 192 mg/dL — ABNORMAL HIGH (ref 70–99)
Potassium: 3.5 mmol/L (ref 3.5–5.1)
Sodium: 137 mmol/L (ref 135–145)

## 2020-11-01 LAB — MAGNESIUM: Magnesium: 1.3 mg/dL — ABNORMAL LOW (ref 1.7–2.4)

## 2020-11-01 SURGERY — EGD (ESOPHAGOGASTRODUODENOSCOPY)
Anesthesia: Monitor Anesthesia Care

## 2020-11-01 SURGERY — COLONOSCOPY WITH PROPOFOL
Anesthesia: Monitor Anesthesia Care

## 2020-11-01 MED ORDER — SODIUM CHLORIDE 0.9 % IV SOLN: INTRAVENOUS | Status: DC

## 2020-11-01 MED ORDER — LACTATED RINGERS IV SOLN: INTRAVENOUS | Status: DC

## 2020-11-01 MED ORDER — MAGNESIUM SULFATE 2 GM/50ML IV SOLN
2.0000 g | Freq: Once | INTRAVENOUS | Status: AC
  Administered 2020-11-01: 2 g via INTRAVENOUS
  Filled 2020-11-01: qty 50

## 2020-11-01 MED ORDER — MUPIROCIN 2 % EX OINT
1.0000 "application " | TOPICAL_OINTMENT | Freq: Two times a day (BID) | CUTANEOUS | Status: AC
  Administered 2020-11-02 – 2020-11-06 (×9): 1 via NASAL
  Filled 2020-11-01 (×2): qty 22

## 2020-11-01 MED ORDER — PROPOFOL 500 MG/50ML IV EMUL
INTRAVENOUS | Status: DC | PRN
  Administered 2020-11-01: 100 ug/kg/min via INTRAVENOUS

## 2020-11-01 MED ORDER — PEG 3350-KCL-NA BICARB-NACL 420 G PO SOLR
4000.0000 mL | Freq: Once | ORAL | Status: DC
  Filled 2020-11-01 (×2): qty 4000

## 2020-11-01 MED ORDER — PANTOPRAZOLE SODIUM 40 MG PO TBEC
40.0000 mg | DELAYED_RELEASE_TABLET | Freq: Two times a day (BID) | ORAL | Status: DC
  Administered 2020-11-01 – 2020-11-08 (×13): 40 mg via ORAL
  Filled 2020-11-01 (×13): qty 1

## 2020-11-01 MED ORDER — CHLORHEXIDINE GLUCONATE CLOTH 2 % EX PADS
6.0000 | MEDICATED_PAD | Freq: Every day | CUTANEOUS | Status: AC
  Administered 2020-11-02 – 2020-11-06 (×5): 6 via TOPICAL

## 2020-11-01 MED ORDER — NYSTATIN 100000 UNIT/ML MT SUSP
5.0000 mL | Freq: Four times a day (QID) | OROMUCOSAL | Status: DC
  Administered 2020-11-01 – 2020-11-08 (×23): 500000 [IU] via ORAL
  Filled 2020-11-01 (×24): qty 5

## 2020-11-01 MED ORDER — PROPOFOL 10 MG/ML IV BOLUS
INTRAVENOUS | Status: DC | PRN
  Administered 2020-11-01: 20 mg via INTRAVENOUS

## 2020-11-01 NOTE — Op Note (Signed)
[facility] Patient Name: Kirsten Jensen Procedure Date : 11/01/2020 MRN: [id number] Attending MD: Vida Rigger , MD Date of Birth: October 21, 1932 CSN: [id number] Age: 86 Admit Type: Inpatient Procedure:                Upper GI endoscopy Indications:              Iron deficiency anemia secondary to chronic blood                            loss, Heme positive stool Providers:                Vida Rigger, MD, Fayrene Fearing, RN, Michele Mcalpine                            Technician, Rozetta Nunnery, Technician, Dairl Ponder, CRNA Referring MD:              Medicines:                Propofol total dose 180 mg IV Complications:            No immediate complications. Estimated Blood Loss:     Estimated blood loss: none. Procedure:                Pre-Anesthesia Assessment:                           - Prior to the procedure, a History and Physical                            was performed, and patient medications and                            allergies were reviewed. The patient's tolerance of                            previous anesthesia was also reviewed. The risks                            and benefits of the procedure and the sedation                            options and risks were discussed with the patient.                            All questions were answered, and informed consent                            was obtained. Prior Anticoagulants: The patient has                            taken Eliquis (apixaban), last dose was 1 day prior  to procedure. ASA Grade Assessment: III - A patient                            with severe systemic disease. After reviewing the                            risks and benefits, the patient was deemed in                            satisfactory condition to undergo the procedure.                           After obtaining informed consent, the endoscope was                            passed under  direct vision. Throughout the                            procedure, the patient's blood pressure, pulse, and                            oxygen saturations were monitored continuously. The                            [id number]-[id number] ([id number] Olympus gastroscope was                            introduced through the mouth, and advanced to the                            third part of duodenum. The upper GI endoscopy was                            accomplished without difficulty. The patient                            tolerated the procedure well. Scope In: Scope Out: Findings:      The larynx was normal.      , white plaques were found in the middle third of the esophagus and in       the lower third of the esophagus.      A small hiatal hernia was present.      The entire examined stomach was normal.      One non-bleeding cratered duodenal ulcer with no stigmata of bleeding       was found in the duodenal bulb.      The second portion of the duodenum and third portion of the duodenum       were normal.      The exam was otherwise without abnormality. Impression:               - Normal larynx.                           - Esophageal plaques were found, consistent with  candidiasis.                           - Small hiatal hernia.                           - Normal stomach.                           - Non-bleeding duodenal ulcer with no stigmata of                            bleeding.                           - Normal second portion of the duodenum and third                            portion of the duodenum.                           - The examination was otherwise normal.                           - No specimens collected. Recommendation:           - Clear liquid diet today.                           - Continue present medications.                           - Return to GI clinic PRN.                           - Telephone GI clinic if symptomatic PRN.                            - Perform a colonoscopy tomorrow.                           - Use Protonix (pantoprazole) 40 mg PO BID                            indefinitely. Can discharge on once a day                           - Nystatin suspension 100,000 units PO QID for 1                            week. Procedure Code(s):        --- Professional ---                           [phone], Esophagogastroduodenoscopy, flexible,                            transoral; diagnostic, including collection of  specimen(s) by brushing or washing, when performed                            (separate procedure) Diagnosis Code(s):        --- Professional ---                           K22.9, Disease of esophagus, unspecified                           K44.9, Diaphragmatic hernia without obstruction or                            gangrene                           K26.9, Duodenal ulcer, unspecified as acute or                            chronic, without hemorrhage or perforation                           D50.0, Iron deficiency anemia secondary to blood                            loss (chronic)                           R19.5, Other fecal abnormalities CPT copyright 2019 American Medical Association. All rights reserved. The codes documented in this report are preliminary and upon coder review may  be revised to meet current compliance requirements. Vida Rigger, MD 11/01/2020 9:04:04 AM This report has been signed electronically. Number of Addenda: 0

## 2020-11-01 NOTE — Progress Notes (Addendum)
Kirsten Jensen 8:04 AM  Subjective: Patient seen and examined hospital computer reviewed case discussed with my partner Dr. Matthias Hughs and our PA and she has minimal GI symptoms we will see some blood from her nose occasionally from her hemorrhoids and has a hot feeling when she moves her bowels that goes up her spine but otherwise no other GI complaints she had no problems when she had done her previous procedures in [place] over 5 years ago  Objective: No signs stable afebrile no acute distress exam please see preassessment evaluation BUN and creatinine okay hemoglobin stable  Assessment: Guaiac positive anemia and patient on Eliquis but 1 part of her chart has her on Plavix as well but other notes contradict that Plan: Okay to proceed with endoscopy with anesthesia assistance and if negative and well-tolerated colonoscopy tomorrow and patient agrees with the plan  [facility]  office [phone] After 5PM or if no answer call [phone]

## 2020-11-01 NOTE — Progress Notes (Signed)
Primary cardiologist:  Klein/Acharya   Subjective:  Denies SSCP, palpitations or Dyspnea Gong to endo this am   Objective:  Vitals:   10/31/20 2359 11/01/20 0001 11/01/20 0400 11/01/20 0500  BP:  132/77 (!) 144/88   Pulse: 88 88 91   Resp: 18 17 18    Temp:  98.2 F (36.8 C) 98.6 F (37 C)   TempSrc:  Oral Tympanic   SpO2: 99% 100% 96%   Weight:    (!) 144.2 kg  Height:        Intake/Output from previous day:  Intake/Output Summary (Last 24 hours) at 11/01/2020 0743 Last data filed at 10/31/2020 1736 Gross per 24 hour  Intake 960 ml  Output 400 ml  Net 560 ml    Physical Exam: Overweight white female Blind macular degeneration Lungs clear No murmur  Trace edeam Pale  Pacer under left clavicle   Lab Results: Basic Metabolic Panel: Recent Labs    10/31/20 0258 11/01/20 0216  NA 138 137  K 3.6 3.5  CL 100 100  CO2 26 26  GLUCOSE 246* 192*  BUN 37* 26*  CREATININE 1.25* 1.02*  CALCIUM 9.1 8.7*  MG  --  1.3*  PHOS  --  2.4*   Liver Function Tests: Recent Labs    10/30/20 2027 10/31/20 0258  AST 81* 71*  ALT 50* 47*  ALKPHOS 156* 155*  BILITOT 0.7 0.7  PROT 6.8 6.4*  ALBUMIN 3.2* 3.1*   No results for input(s): LIPASE, AMYLASE in the last 72 hours. CBC: Recent Labs    10/30/20 2027 10/30/20 2027 10/31/20 0258 10/31/20 1035 11/01/20 0101 11/01/20 0216  WBC 9.6  --  9.2  --   --   --   NEUTROABS  --   --  7.2  --   --   --   HGB 8.2*   < > 8.1*   < > 7.6* 7.8*  HCT 26.7*   < > 25.9*   < > 25.1* 25.3*  MCV 97.4  --  96.3  --   --   --   PLT 399  --  374  --   --   --    < > = values in this interval not displayed.   Hemoglobin A1C: Recent Labs    10/31/20 0258  HGBA1C 7.9*   Fasting Lipid Panel: No results for input(s): CHOL, HDL, LDLCALC, TRIG, CHOLHDL, LDLDIRECT in the last 72 hours. Thyroid Function Tests: Recent Labs    10/31/20 0258  TSH 1.050   Anemia Panel: Recent Labs    10/31/20 0258  VITAMINB12 1,164*   FOLATE 6.6  FERRITIN 25  TIBC 434  IRON 23*    Imaging: DG Chest 2 View  Result Date: 10/30/2020 CLINICAL DATA:  Cough EXAM: CHEST - 2 VIEW COMPARISON:  April 2021 FINDINGS: Mild interstitial prominence appears chronic. No pleural effusion. Mild cardiomegaly. Left chest wall dual lead pacemaker. No acute osseous abnormality. IMPRESSION: Chronic interstitial changes. Electronically Signed   By: May 2021 M.D.   On: 10/30/2020 16:11   ECHOCARDIOGRAM COMPLETE  Result Date: 10/31/2020    ECHOCARDIOGRAM REPORT   Patient Name:   Kirsten Jensen Date of Exam: 10/31/2020 Medical Rec #:  11/02/2020    Height:       67.0 in Accession #:    [id number]   Weight:       318.8 lb Date of Birth:  [birth year]     BSA:  2.465 m Patient Age:    86 years     BP:           110/76 mmHg Patient Gender: F            HR:           96 bpm. Exam Location:  Inpatient Procedure: 2D Echo Indications:    acute diastolic chf 428.31  History:        Patient has prior history of Echocardiogram examinations, most                 recent 04/06/2020. Pacemaker, Arrythmias:Atrial Fibrillation and                 Atrial Flutter; Risk Factors:Hypertension and Diabetes.  Sonographer:    Delcie Roch Referring Phys: [id number] CAROLE N HALL  Sonographer Comments: Patient is morbidly obese. Image acquisition challenging due to patient body habitus. IMPRESSIONS  1. Left ventricular ejection fraction, by estimation, is 55 to 60%. The left ventricle has normal function. The left ventricle has no regional wall motion abnormalities. There is mild concentric left ventricular hypertrophy. Left ventricular diastolic function could not be evaluated.  2. Right ventricular systolic function is normal. The right ventricular size is normal. There is moderately elevated pulmonary artery systolic pressure. The estimated right ventricular systolic pressure is 58.8 mmHg.  3. The mitral valve is degenerative. Mild mitral valve regurgitation.  4.  Tricuspid valve regurgitation is mild to moderate.  5. The aortic valve is tricuspid. There is mild calcification of the aortic valve. There is mild thickening of the aortic valve. Aortic valve regurgitation is not visualized. Mild aortic valve sclerosis is present, with no evidence of aortic valve stenosis.  6. The inferior vena cava is dilated in size with <50% respiratory variability, suggesting right atrial pressure of 15 mmHg. Comparison(s): Changes from prior study are noted. EF 55-60%. Moderately elevated pulmonary pressures. FINDINGS  Left Ventricle: Left ventricular ejection fraction, by estimation, is 55 to 60%. The left ventricle has normal function. The left ventricle has no regional wall motion abnormalities. The left ventricular internal cavity size was normal in size. There is  mild concentric left ventricular hypertrophy. Left ventricular diastolic function could not be evaluated due to atrial fibrillation. Left ventricular diastolic function could not be evaluated. Right Ventricle: The right ventricular size is normal. No increase in right ventricular wall thickness. Right ventricular systolic function is normal. There is moderately elevated pulmonary artery systolic pressure. The tricuspid regurgitant velocity is 3.31 m/s, and with an assumed right atrial pressure of 15 mmHg, the estimated right ventricular systolic pressure is 58.8 mmHg. Left Atrium: Left atrial size was normal in size. Right Atrium: Right atrial size was normal in size. Pericardium: Trivial pericardial effusion is present. Mitral Valve: The mitral valve is degenerative in appearance. Mild to moderate mitral annular calcification. Mild mitral valve regurgitation. Tricuspid Valve: The tricuspid valve is grossly normal. Tricuspid valve regurgitation is mild to moderate. No evidence of tricuspid stenosis. Aortic Valve: The aortic valve is tricuspid. There is mild calcification of the aortic valve. There is mild thickening of the  aortic valve. Aortic valve regurgitation is not visualized. Mild aortic valve sclerosis is present, with no evidence of aortic valve stenosis. Pulmonic Valve: The pulmonic valve was grossly normal. Pulmonic valve regurgitation is not visualized. No evidence of pulmonic stenosis. Aorta: The aortic root and ascending aorta are structurally normal, with no evidence of dilitation. Venous: The inferior vena cava is dilated in size with  less than 50% respiratory variability, suggesting right atrial pressure of 15 mmHg. IAS/Shunts: The atrial septum is grossly normal. Additional Comments: A pacer wire is visualized in the right atrium and right ventricle.  LEFT VENTRICLE PLAX 2D LVIDd:         4.70 cm LVIDs:         4.10 cm LV PW:         1.30 cm LV IVS:        1.20 cm  IVC IVC diam: 2.60 cm LEFT ATRIUM             Index       RIGHT ATRIUM           Index LA diam:        4.30 cm 1.74 cm/m  RA Area:     16.20 cm LA Vol (A2C):   72.4 ml 29.37 ml/m RA Volume:   41.40 ml  16.79 ml/m LA Vol (A4C):   55.5 ml 22.51 ml/m LA Biplane Vol: 65.3 ml 26.49 ml/m   AORTA Ao Asc diam: 3.40 cm TRICUSPID VALVE TR Peak grad:   43.8 mmHg TR Vmax:        331.00 cm/s Lennie Odor MD Electronically signed by Lennie Odor MD Signature Date/Time: 10/31/2020/12:35:52 PM    Final     Cardiac Studies:  ECG: afib intermittent pacing    Telemetry: afib intermittent pacing   Echo: EF 55-60%10/31/20  Medications:   . buPROPion  100 mg Oral Daily  . chlorhexidine  15 mL Mouth Rinse BID  . insulin aspart  0-9 Units Subcutaneous Q4H  . levothyroxine  50 mcg Oral QAC breakfast  . mouth rinse  15 mL Mouth Rinse q12n4p  . metoprolol succinate  25 mg Oral Daily  . pantoprazole (PROTONIX) IV  40 mg Intravenous Q12H  . pravastatin  80 mg Oral Daily  . vitamin B-12  1,000 mcg Oral Daily     . magnesium sulfate bolus IVPB 2 g (11/01/20 0710)    Assessment/Plan:   1. CAD:  Prior stenting in [place] ? 5 years ago Currently no chest pain  Plavix held due to GI bleeding echo done  04/06/20 with EF 50% no definite RWMA;s Troponin not elevated 25 ECG with afib pacing no acute changes  Toprol added yesterday  Add back plavix after GI / anemia issues sorted  2. PPM:  Normal function f/u Graciela Husbands  3. Afib:  See above regarding rate control hold eliquis in setting of anemia ? GI bleed.  EGD today Echo done Yesterday showed EF 55-60%   4. HLD continue statin     Charlton Haws 11/01/2020, 7:43 AM

## 2020-11-01 NOTE — Transfer of Care (Signed)
Immediate Anesthesia Transfer of Care Note  Patient: Kirsten Jensen  Procedure(s) Performed: ESOPHAGOGASTRODUODENOSCOPY (EGD) (N/A )  Patient Location: Endoscopy Unit  Anesthesia Type:MAC  Level of Consciousness: drowsy  Airway & Oxygen Therapy: Patient Spontanous Breathing and Patient connected to nasal cannula oxygen  Post-op Assessment: Report given to RN and Post -op Vital signs reviewed and stable  Post vital signs: Reviewed and stable  Last Vitals:  Vitals Value Taken Time  BP    Temp    Pulse 94 11/01/20 0856  Resp 26 11/01/20 0856  SpO2 88 % 11/01/20 0856  Vitals shown include unvalidated device data.  Last Pain:  Vitals:   11/01/20 0759  TempSrc: Oral  PainSc: 0-No pain         Complications: No complications documented.

## 2020-11-01 NOTE — Progress Notes (Signed)
PROGRESS NOTE    Kirsten Jensen  AST:[id number] DOB: 1932-10-02 DOA: 10/30/2020 PCP: Melida Quitter, MD   Brief Narrative:  This 86 years old female with medical history significant for permanent A. fib on Eliquis, tachybradycardia syndrome status post pacemaker, essential hypertension, type 2 diabetes, obstructive sleep apnea on CPAP presented in the ED after she was sent by her provider at [facility] due to abnormal labs.  Her hemoglobin dropped from 11 to 8 g.  She also had a positive FOBT in the ED.   Patient was admitted for possible upper GI bleed,  started on Protonix drip.  She denies any prior history of GI bleed.  Patient also reports 14 pound weight gain associated with chest discomfort.  Eliquis was kept on hold.  Cardiology consulted recommended to continue metoprolol and proceed with EGD and colonoscopy.  Gastroenterology consulted,  Patient underwent EGD found to have esophageal plaque consistent with candidiasis, nonbleeding duodenal ulcer.  Scheduled for colonoscopy tomorrow.   Assessment & Plan:   Active Problems:   Upper GI bleed   Chronic atrial fibrillation (HCC)   Presumed upper GI bleed:  Hemoglobin dropped from 11 g to 8 g. Positive FOBT in the ED She was started on Protonix drip Hold Eliquis, per cardiology Repeat H&H every 6 hours.  Hemoglobin remained stable eight 8.0 Obtain iron studies Transfuse hemoglobin less than 7.0 or less than 8 if symptomatic GI consulted.  Patient underwent EGD 11/20. She was found to have esophageal plaques consistent with candidiasis, nonbleeding duodenal ulcer. Continue Protonix 40 mg twice daily Patient is scheduled to have colonoscopy tomorrow.  Permanent A. fib on Eliquis Eliquis is currently on hold due to presumed upper GI bleed. Recommend cardiology and GI evaluation for guidance regarding future anticoagulation Patient has a CHA2DS2-VASc score of 6 Continue metoprolol for heart rate control Continue to monitor on  telemetry  Acute on chronic diastolic CHF Presented with elevated BNP greater than 600 and evidence of volume overload on exam She is on torsemide at home 20 mg daily Received a dose of IV Lasix in the ED Hold off diuretics for now to maintain MAP greater than 65 in the setting of possible active GI bleed Start strict I's and O's and daily weight Echocardiogram unremarkable EF 55 to 60%.  AKI on CKD 3A >> improving Baseline creatinine appears to be 1.0 with GFR of 56 Presented with creatinine of 1.3 with GFR of 39 Avoid nephrotoxins Monitor urine output Repeat renal panel  Elevated troponin suspect demand ischemia in the setting of anemia Troponin S 26 Trend troponin 26, 25, 26  Coronary artery disease status post PCI x2 Resume Plavix if okay with GI. Continue home Pravachol  Essential hypertension  Resume Toprol. Maintain MAP greater than 65 Closely monitor vital signs  Hypothyroidism TSH normal Resume home levothyroxine  Type 2 diabetes with hyperglycemia Hold off oral hypoglycemics Obtain A1c Start insulin sliding scale  Severe morbid obesity BMI 49 Management per PCP  OSA CPAP at night   DVT prophylaxis: SCDs Code Status: Full Family Communication:  No family at bed side. Disposition Plan:  Status is: Inpatient  Remains inpatient appropriate because:Inpatient level of care appropriate due to severity of illness   Dispo:  Patient From: Home  Planned Disposition: Home  Expected discharge date:  11/02/2020 Medically stable for discharge: No    Consultants:   Gastroenterology  Cardiology  Procedures: EGD and colonoscopy Antimicrobials:  Anti-infectives (From admission, onward)   None  Subjective: Patient was seen and examined at bedside.  Overnight events noted.   Patient is going to have EGD and colonoscopy done today.  She denies any abdominal pain.  Objective: Vitals:   11/01/20 0927 11/01/20 0937 11/01/20 1001  11/01/20 1318  BP: 115/65 108/63 122/80 115/77  Pulse: 96 85 100 84  Resp: (!) 23 (!) 22 (!) 22 17  Temp:    (!) 97.5 F (36.4 C)  TempSrc:    Oral  SpO2: 96% 99% 100% 96%  Weight:      Height:        Intake/Output Summary (Last 24 hours) at 11/01/2020 1417 Last data filed at 11/01/2020 0851 Gross per 24 hour  Intake 820 ml  Output --  Net 820 ml   Filed Weights   10/31/20 0325 11/01/20 0500 11/01/20 0759  Weight: (!) 144.6 kg (!) 144.2 kg (!) 144.2 kg    Examination:  General exam: Appears calm and comfortable  Respiratory system: Clear to auscultation. Respiratory effort normal. Cardiovascular system: S1 & S2 heard, RRR. No JVD, murmurs, rubs, gallops or clicks. No pedal edema. Gastrointestinal system: Abdomen is nondistended, soft and nontender. No organomegaly or masses felt. Normal bowel sounds heard. Central nervous system: Alert and oriented. No focal neurological deficits. Extremities: Symmetric 5 x 5 power. Skin: No rashes, lesions or ulcers Psychiatry: Judgement and insight appear normal. Mood & affect appropriate.     Data Reviewed: I have personally reviewed following labs and imaging studies  CBC: Recent Labs  Lab 10/30/20 1505 10/30/20 1505 10/30/20 2027 10/30/20 2027 10/31/20 0258 10/31/20 0258 10/31/20 1035 10/31/20 1611 11/01/20 0101 11/01/20 0216 11/01/20 1110  WBC 10.8*  --  9.6  --  9.2  --   --   --   --   --   --   NEUTROABS  --   --   --   --  7.2  --   --   --   --   --   --   HGB 8.0*   < > 8.2*   < > 8.1*   < > 8.5* 8.1* 7.6* 7.8* 8.0*  HCT 26.7*   < > 26.7*   < > 25.9*   < > 27.6* 26.1* 25.1* 25.3* 25.9*  MCV 96.4  --  97.4  --  96.3  --   --   --   --   --   --   PLT 382  --  399  --  374  --   --   --   --   --   --    < > = values in this interval not displayed.   Basic Metabolic Panel: Recent Labs  Lab 10/30/20 1505 10/30/20 2027 10/31/20 0258 11/01/20 0216  NA 136 137 138 137  K 3.9 3.6 3.6 3.5  CL 99 100 100 100   CO2 23 23 26 26   GLUCOSE 319* 288* 246* 192*  BUN 40* 39* 37* 26*  CREATININE 1.32* 1.33* 1.25* 1.02*  CALCIUM 9.3 9.3 9.1 8.7*  MG  --   --   --  1.3*  PHOS  --   --   --  2.4*   GFR: Estimated Creatinine Clearance: 59.1 mL/min (A) (by C-G formula based on SCr of 1.02 mg/dL (H)). Liver Function Tests: Recent Labs  Lab 10/30/20 2027 10/31/20 0258  AST 81* 71*  ALT 50* 47*  ALKPHOS 156* 155*  BILITOT 0.7 0.7  PROT 6.8 6.4*  ALBUMIN  3.2* 3.1*   No results for input(s): LIPASE, AMYLASE in the last 168 hours. No results for input(s): AMMONIA in the last 168 hours. Coagulation Profile: Recent Labs  Lab 10/31/20 0041  INR 2.0*   Cardiac Enzymes: No results for input(s): CKTOTAL, CKMB, CKMBINDEX, TROPONINI in the last 168 hours. BNP (last 3 results) No results for input(s): PROBNP in the last 8760 hours. HbA1C: Recent Labs    10/31/20 0258  HGBA1C 7.9*   CBG: Recent Labs  Lab 10/31/20 1949 10/31/20 2338 11/01/20 0410 11/01/20 1006 11/01/20 1158  GLUCAP 176* 217* 166* 161* 257*   Lipid Profile: No results for input(s): CHOL, HDL, LDLCALC, TRIG, CHOLHDL, LDLDIRECT in the last 72 hours. Thyroid Function Tests: Recent Labs    10/31/20 0258  TSH 1.050   Anemia Panel: Recent Labs    10/31/20 0258  VITAMINB12 1,164*  FOLATE 6.6  FERRITIN 25  TIBC 434  IRON 23*   Sepsis Labs: No results for input(s): PROCALCITON, LATICACIDVEN in the last 168 hours.  Recent Results (from the past 240 hour(s))  Respiratory Panel by RT PCR (Flu A&B, Covid) - Nasopharyngeal Swab     Status: None   Collection Time: 10/30/20 10:52 PM   Specimen: Nasopharyngeal Swab; Nasopharyngeal(NP) swabs in vial transport medium  Result Value Ref Range Status   SARS Coronavirus 2 by RT PCR NEGATIVE NEGATIVE Final    Comment: (NOTE) SARS-CoV-2 target nucleic acids are NOT DETECTED.  The SARS-CoV-2 RNA is generally detectable in upper respiratoy specimens during the acute phase of  infection. The lowest concentration of SARS-CoV-2 viral copies this assay can detect is 131 copies/mL. A negative result does not preclude SARS-Cov-2 infection and should not be used as the sole basis for treatment or other patient management decisions. A negative result may occur with  improper specimen collection/handling, submission of specimen other than nasopharyngeal swab, presence of viral mutation(s) within the areas targeted by this assay, and inadequate number of viral copies (<131 copies/mL). A negative result must be combined with clinical observations, patient history, and epidemiological information. The expected result is Negative.  Fact Sheet for Patients:  https://www.moore.com/  Fact Sheet for Healthcare Providers:  https://www.young.biz/  This test is no t yet approved or cleared by the [place] FDA and  has been authorized for detection and/or diagnosis of SARS-CoV-2 by FDA under an Emergency Use Authorization (EUA). This EUA will remain  in effect (meaning this test can be used) for the duration of the COVID-19 declaration under Section 564(b)(1) of the Act, 21 U.S.C. section 360bbb-3(b)(1), unless the authorization is terminated or revoked sooner.     Influenza A by PCR NEGATIVE NEGATIVE Final   Influenza B by PCR NEGATIVE NEGATIVE Final    Comment: (NOTE) The Xpert Xpress SARS-CoV-2/FLU/RSV assay is intended as an aid in  the diagnosis of influenza from Nasopharyngeal swab specimens and  should not be used as a sole basis for treatment. Nasal washings and  aspirates are unacceptable for Xpert Xpress SARS-CoV-2/FLU/RSV  testing.  Fact Sheet for Patients: https://www.moore.com/  Fact Sheet for Healthcare Providers: https://www.young.biz/  This test is not yet approved or cleared by the [place] FDA and  has been authorized for detection and/or diagnosis of SARS-CoV-2  by  FDA under an Emergency Use Authorization (EUA). This EUA will remain  in effect (meaning this test can be used) for the duration of the  Covid-19 declaration under Section 564(b)(1) of the Act, 21  U.S.C. section 360bbb-3(b)(1), unless the authorization is  terminated or revoked. Performed at [facility], [address], [place], [place] [address]   Surgical pcr screen     Status: Abnormal   Collection Time: 11/01/20  5:02 AM   Specimen: Nasal Mucosa; Nasal Swab  Result Value Ref Range Status   MRSA, PCR NEGATIVE NEGATIVE Final   Staphylococcus aureus POSITIVE (A) NEGATIVE Final    Comment: (NOTE) The Xpert SA Assay (FDA approved for NASAL specimens in patients 86 years of age and older), is one component of a comprehensive surveillance program. It is not intended to diagnose infection nor to guide or monitor treatment. Performed at [facility], [address], [place], [place] [address]     Radiology Studies: DG Chest 2 View  Result Date: 10/30/2020 CLINICAL DATA:  Cough EXAM: CHEST - 2 VIEW COMPARISON:  April 2021 FINDINGS: Mild interstitial prominence appears chronic. No pleural effusion. Mild cardiomegaly. Left chest wall dual lead pacemaker. No acute osseous abnormality. IMPRESSION: Chronic interstitial changes. Electronically Signed   By: Guadlupe Spanish M.D.   On: 10/30/2020 16:11   ECHOCARDIOGRAM COMPLETE  Result Date: 10/31/2020    ECHOCARDIOGRAM REPORT   Patient Name:   Kirsten Jensen Date of Exam: 10/31/2020 Medical Rec #:  [id number]    Height:       67.0 in Accession #:    [id number]   Weight:       318.8 lb Date of Birth:  Jun 25, 1932     BSA:          2.465 m Patient Age:    88 years     BP:           110/76 mmHg Patient Gender: F            HR:           96 bpm. Exam Location:  Inpatient Procedure: 2D Echo Indications:    acute diastolic chf 428.31  History:        Patient has prior history of Echocardiogram examinations, most                 recent  04/06/2020. Pacemaker, Arrythmias:Atrial Fibrillation and                 Atrial Flutter; Risk Factors:Hypertension and Diabetes.  Sonographer:    Delcie Roch Referring Phys: [id number] CAROLE N HALL  Sonographer Comments: Patient is morbidly obese. Image acquisition challenging due to patient body habitus. IMPRESSIONS  1. Left ventricular ejection fraction, by estimation, is 55 to 60%. The left ventricle has normal function. The left ventricle has no regional wall motion abnormalities. There is mild concentric left ventricular hypertrophy. Left ventricular diastolic function could not be evaluated.  2. Right ventricular systolic function is normal. The right ventricular size is normal. There is moderately elevated pulmonary artery systolic pressure. The estimated right ventricular systolic pressure is 58.8 mmHg.  3. The mitral valve is degenerative. Mild mitral valve regurgitation.  4. Tricuspid valve regurgitation is mild to moderate.  5. The aortic valve is tricuspid. There is mild calcification of the aortic valve. There is mild thickening of the aortic valve. Aortic valve regurgitation is not visualized. Mild aortic valve sclerosis is present, with no evidence of aortic valve stenosis.  6. The inferior vena cava is dilated in size with <50% respiratory variability, suggesting right atrial pressure of 15 mmHg. Comparison(s): Changes from prior study are noted. EF 55-60%. Moderately elevated pulmonary pressures. FINDINGS  Left Ventricle: Left ventricular ejection fraction, by  estimation, is 55 to 60%. The left ventricle has normal function. The left ventricle has no regional wall motion abnormalities. The left ventricular internal cavity size was normal in size. There is  mild concentric left ventricular hypertrophy. Left ventricular diastolic function could not be evaluated due to atrial fibrillation. Left ventricular diastolic function could not be evaluated. Right Ventricle: The right ventricular size is  normal. No increase in right ventricular wall thickness. Right ventricular systolic function is normal. There is moderately elevated pulmonary artery systolic pressure. The tricuspid regurgitant velocity is 3.31 m/s, and with an assumed right atrial pressure of 15 mmHg, the estimated right ventricular systolic pressure is 58.8 mmHg. Left Atrium: Left atrial size was normal in size. Right Atrium: Right atrial size was normal in size. Pericardium: Trivial pericardial effusion is present. Mitral Valve: The mitral valve is degenerative in appearance. Mild to moderate mitral annular calcification. Mild mitral valve regurgitation. Tricuspid Valve: The tricuspid valve is grossly normal. Tricuspid valve regurgitation is mild to moderate. No evidence of tricuspid stenosis. Aortic Valve: The aortic valve is tricuspid. There is mild calcification of the aortic valve. There is mild thickening of the aortic valve. Aortic valve regurgitation is not visualized. Mild aortic valve sclerosis is present, with no evidence of aortic valve stenosis. Pulmonic Valve: The pulmonic valve was grossly normal. Pulmonic valve regurgitation is not visualized. No evidence of pulmonic stenosis. Aorta: The aortic root and ascending aorta are structurally normal, with no evidence of dilitation. Venous: The inferior vena cava is dilated in size with less than 50% respiratory variability, suggesting right atrial pressure of 15 mmHg. IAS/Shunts: The atrial septum is grossly normal. Additional Comments: A pacer wire is visualized in the right atrium and right ventricle.  LEFT VENTRICLE PLAX 2D LVIDd:         4.70 cm LVIDs:         4.10 cm LV PW:         1.30 cm LV IVS:        1.20 cm  IVC IVC diam: 2.60 cm LEFT ATRIUM             Index       RIGHT ATRIUM           Index LA diam:        4.30 cm 1.74 cm/m  RA Area:     16.20 cm LA Vol (A2C):   72.4 ml 29.37 ml/m RA Volume:   41.40 ml  16.79 ml/m LA Vol (A4C):   55.5 ml 22.51 ml/m LA Biplane Vol: 65.3  ml 26.49 ml/m   AORTA Ao Asc diam: 3.40 cm TRICUSPID VALVE TR Peak grad:   43.8 mmHg TR Vmax:        331.00 cm/s Lennie Odor MD Electronically signed by Lennie Odor MD Signature Date/Time: 10/31/2020/12:35:52 PM    Final    Scheduled Meds: . buPROPion  100 mg Oral Daily  . chlorhexidine  15 mL Mouth Rinse BID  . insulin aspart  0-9 Units Subcutaneous Q4H  . levothyroxine  50 mcg Oral QAC breakfast  . mouth rinse  15 mL Mouth Rinse q12n4p  . metoprolol succinate  25 mg Oral Daily  . nystatin  5 mL Oral QID  . pantoprazole  40 mg Oral BID AC  . polyethylene glycol-electrolytes  4,000 mL Oral Once  . pravastatin  80 mg Oral Daily  . vitamin B-12  1,000 mcg Oral Daily   Continuous Infusions: . sodium chloride    .  sodium chloride    . lactated ringers Stopped (11/01/20 0903)     LOS: 1 day    Time spent: 25 mins.    Cipriano Bunker, MD [facility]   If 7PM-7AM, please contact night-coverage

## 2020-11-01 NOTE — Progress Notes (Signed)
Rt Note: Pt states she does not want to wear CPAP tonight she'd rather wear 2l [place]. I told pt if she changes her mind to call RT

## 2020-11-01 NOTE — Anesthesia Preprocedure Evaluation (Signed)
Anesthesia Evaluation  Patient identified by MRN, date of birth, ID band Patient awake    Reviewed: Allergy & Precautions, H&P , NPO status , Patient's Chart, lab work & pertinent test results  Airway Mallampati: II   Neck ROM: full    Dental   Pulmonary neg pulmonary ROS,    breath sounds clear to auscultation       Cardiovascular hypertension, + CAD  + dysrhythmias Atrial Fibrillation + pacemaker  Rhythm:regular Rate:Normal     Neuro/Psych    GI/Hepatic Heme + stools   Endo/Other  diabetes, Type 2Morbid obesity  Renal/GU      Musculoskeletal   Abdominal   Peds  Hematology  (+) anemia ,   Anesthesia Other Findings   Reproductive/Obstetrics                             Anesthesia Physical Anesthesia Plan  ASA: III  Anesthesia Plan: MAC   Post-op Pain Management:    Induction: Intravenous  PONV Risk Score and Plan: 2 and Propofol infusion and Treatment may vary due to age or medical condition  Airway Management Planned: Nasal Cannula  Additional Equipment:   Intra-op Plan:   Post-operative Plan:   Informed Consent: I have reviewed the patients History and Physical, chart, labs and discussed the procedure including the risks, benefits and alternatives for the proposed anesthesia with the patient or authorized representative who has indicated his/her understanding and acceptance.       Plan Discussed with: CRNA, Anesthesiologist and Surgeon  Anesthesia Plan Comments:         Anesthesia Quick Evaluation

## 2020-11-01 NOTE — Plan of Care (Signed)
Patient is alert and oriented x4. Here with GI bleed, NPO for colonoscopy. Has had half of Golyt[place]. To complete the rest after endoscopy tomorrow.  Patient denies any pain, has a persistent cough, some relief with tessalon perls.  Problem: Education: Goal: Knowledge of General Education information will improve Description: Including pain rating scale, medication(s)/side effects and non-pharmacologic comfort measures Outcome: Progressing   Problem: Health Behavior/Discharge Planning: Goal: Ability to manage health-related needs will improve Outcome: Progressing   Problem: Clinical Measurements: Goal: Ability to maintain clinical measurements within normal limits will improve Outcome: Progressing Goal: Diagnostic test results will improve Outcome: Progressing Goal: Respiratory complications will improve Outcome: Progressing Goal: Cardiovascular complication will be avoided Outcome: Progressing   Problem: Activity: Goal: Risk for activity intolerance will decrease Outcome: Progressing   Problem: Nutrition: Goal: Adequate nutrition will be maintained Outcome: Progressing   Problem: Coping: Goal: Level of anxiety will decrease Outcome: Progressing   Problem: Elimination: Goal: Will not experience complications related to bowel motility Outcome: Progressing Goal: Will not experience complications related to urinary retention Outcome: Progressing   Problem: Pain Managment: Goal: General experience of comfort will improve Outcome: Progressing   Problem: Safety: Goal: Ability to remain free from injury will improve Outcome: Progressing   Problem: Skin Integrity: Goal: Risk for impaired skin integrity will decrease Outcome: Progressing

## 2020-11-02 ENCOUNTER — Encounter (HOSPITAL_COMMUNITY): Payer: Self-pay | Admitting: Internal Medicine

## 2020-11-02 ENCOUNTER — Inpatient Hospital Stay (HOSPITAL_COMMUNITY): Payer: Medicare Other | Admitting: Certified Registered Nurse Anesthetist

## 2020-11-02 ENCOUNTER — Encounter (HOSPITAL_COMMUNITY)
Admission: EM | Disposition: A | Discharge status: Other Acute Inpatient Hospital | Payer: Self-pay | Source: Home / Self Care | Attending: Family Medicine

## 2020-11-02 DIAGNOSIS — I482 Chronic atrial fibrillation, unspecified: Secondary | ICD-10-CM | POA: Diagnosis not present

## 2020-11-02 DIAGNOSIS — K922 Gastrointestinal hemorrhage, unspecified: Secondary | ICD-10-CM | POA: Diagnosis not present

## 2020-11-02 HISTORY — PX: COLONOSCOPY: SHX5424

## 2020-11-02 LAB — BASIC METABOLIC PANEL
Anion gap: 10 (ref 5–15)
BUN: 18 mg/dL (ref 8–23)
CO2: 30 mmol/L (ref 22–32)
Calcium: 8.8 mg/dL — ABNORMAL LOW (ref 8.9–10.3)
Chloride: 99 mmol/L (ref 98–111)
Creatinine, Ser: 0.91 mg/dL (ref 0.44–1.00)
GFR, Estimated: >60 mL/min (ref >60)
Glucose, Bld: 147 mg/dL — ABNORMAL HIGH (ref 70–99)
Potassium: 3.2 mmol/L — ABNORMAL LOW (ref 3.5–5.1)
Sodium: 139 mmol/L (ref 135–145)

## 2020-11-02 LAB — GLUCOSE, CAPILLARY
Glucose-Capillary: 137 mg/dL — ABNORMAL HIGH (ref 70–99)
Glucose-Capillary: 145 mg/dL — ABNORMAL HIGH (ref 70–99)
Glucose-Capillary: 150 mg/dL — ABNORMAL HIGH (ref 70–99)
Glucose-Capillary: 155 mg/dL — ABNORMAL HIGH (ref 70–99)
Glucose-Capillary: 162 mg/dL — ABNORMAL HIGH (ref 70–99)
Glucose-Capillary: 167 mg/dL — ABNORMAL HIGH (ref 70–99)
Glucose-Capillary: 290 mg/dL — ABNORMAL HIGH (ref 70–99)

## 2020-11-02 LAB — PHOSPHORUS: Phosphorus: 2.8 mg/dL (ref 2.5–4.6)

## 2020-11-02 LAB — HEMOGLOBIN AND HEMATOCRIT, BLOOD
HCT: 24.8 % — ABNORMAL LOW (ref 36.0–46.0)
Hemoglobin: 7.8 g/dL — ABNORMAL LOW (ref 12.0–15.0)

## 2020-11-02 LAB — MAGNESIUM: Magnesium: 1.8 mg/dL (ref 1.7–2.4)

## 2020-11-02 SURGERY — COLONOSCOPY
Anesthesia: Monitor Anesthesia Care

## 2020-11-02 MED ORDER — TORSEMIDE 20 MG PO TABS
20.0000 mg | ORAL_TABLET | Freq: Every day | ORAL | Status: DC
  Administered 2020-11-03 – 2020-11-08 (×6): 20 mg via ORAL
  Filled 2020-11-02 (×7): qty 1

## 2020-11-02 MED ORDER — MAGNESIUM CITRATE PO SOLN
1.0000 | Freq: Once | ORAL | Status: AC
  Administered 2020-11-02: 1 via ORAL
  Filled 2020-11-02: qty 296

## 2020-11-02 MED ORDER — GUAIFENESIN ER 600 MG PO TB12
600.0000 mg | ORAL_TABLET | Freq: Two times a day (BID) | ORAL | Status: DC | PRN
  Administered 2020-11-02 – 2020-11-03 (×2): 600 mg via ORAL
  Filled 2020-11-02 (×2): qty 1

## 2020-11-02 MED ORDER — PROPOFOL 10 MG/ML IV BOLUS
INTRAVENOUS | Status: DC | PRN
  Administered 2020-11-02: 20 mg via INTRAVENOUS

## 2020-11-02 MED ORDER — POTASSIUM CHLORIDE 10 MEQ/100ML IV SOLN
10.0000 meq | INTRAVENOUS | Status: AC
  Administered 2020-11-02: 10 meq via INTRAVENOUS
  Filled 2020-11-02: qty 100

## 2020-11-02 MED ORDER — ALBUTEROL SULFATE (2.5 MG/3ML) 0.083% IN NEBU
2.5000 mg | INHALATION_SOLUTION | Freq: Once | RESPIRATORY_TRACT | Status: AC
  Administered 2020-11-02: 2.5 mg via RESPIRATORY_TRACT

## 2020-11-02 MED ORDER — RAMIPRIL 5 MG PO CAPS
5.0000 mg | ORAL_CAPSULE | Freq: Every day | ORAL | Status: DC
  Administered 2020-11-03 – 2020-11-08 (×6): 5 mg via ORAL
  Filled 2020-11-02 (×7): qty 1

## 2020-11-02 MED ORDER — ALBUTEROL SULFATE (2.5 MG/3ML) 0.083% IN NEBU
INHALATION_SOLUTION | RESPIRATORY_TRACT | Status: AC
  Filled 2020-11-02: qty 3

## 2020-11-02 MED ORDER — PROPOFOL 500 MG/50ML IV EMUL
INTRAVENOUS | Status: DC | PRN
  Administered 2020-11-02: 100 ug/kg/min via INTRAVENOUS

## 2020-11-02 MED ORDER — METOPROLOL SUCCINATE ER 50 MG PO TB24
50.0000 mg | ORAL_TABLET | Freq: Every day | ORAL | Status: DC
  Administered 2020-11-02 – 2020-11-08 (×6): 50 mg via ORAL
  Filled 2020-11-02 (×7): qty 1

## 2020-11-02 NOTE — Progress Notes (Signed)
Primary cardiologist:  Klein/Acharya   Subjective:  No cardiac symptoms Hb still low For colonoscopy today  Objective:  Vitals:   11/01/20 2003 11/02/20 0359 11/02/20 0419 11/02/20 0820  BP: (!) 123/58  115/65 119/69  Pulse: 82  89 91  Resp: 16  18 18   Temp: 97.6 F (36.4 C)  97.6 F (36.4 C) 98.5 F (36.9 C)  TempSrc: Oral  Oral Oral  SpO2: 100%  97% 98%  Weight:  (!) 142.2 kg    Height:        Intake/Output from previous day: No intake or output data in the 24 hours ending 11/02/20 1016  Physical Exam: Overweight white female Blind macular degeneration Lungs clear No murmur  Trace edeam Pale  Pacer under left clavicle   Lab Results: Basic Metabolic Panel: Recent Labs    11/01/20 0216 11/02/20 0619  NA 137 139  K 3.5 3.2*  CL 100 99  CO2 26 30  GLUCOSE 192* 147*  BUN 26* 18  CREATININE 1.02* 0.91  CALCIUM 8.7* 8.8*  MG 1.3* 1.8  PHOS 2.4* 2.8   Liver Function Tests: Recent Labs    10/30/20 2027 10/31/20 0258  AST 81* 71*  ALT 50* 47*  ALKPHOS 156* 155*  BILITOT 0.7 0.7  PROT 6.8 6.4*  ALBUMIN 3.2* 3.1*   No results for input(s): LIPASE, AMYLASE in the last 72 hours. CBC: Recent Labs    10/30/20 2027 10/30/20 2027 10/31/20 0258 10/31/20 1035 11/01/20 1110 11/02/20 0619  WBC 9.6  --  9.2  --   --   --   NEUTROABS  --   --  7.2  --   --   --   HGB 8.2*   < > 8.1*   < > 8.0* 7.8*  HCT 26.7*   < > 25.9*   < > 25.9* 24.8*  MCV 97.4  --  96.3  --   --   --   PLT 399  --  374  --   --   --    < > = values in this interval not displayed.   Hemoglobin A1C: Recent Labs    10/31/20 0258  HGBA1C 7.9*   Fasting Lipid Panel: No results for input(s): CHOL, HDL, LDLCALC, TRIG, CHOLHDL, LDLDIRECT in the last 72 hours. Thyroid Function Tests: Recent Labs    10/31/20 0258  TSH 1.050   Anemia Panel: Recent Labs    10/31/20 0258  VITAMINB12 1,164*  FOLATE 6.6  FERRITIN 25  TIBC 434  IRON 23*    Imaging: ECHOCARDIOGRAM  COMPLETE  Result Date: 10/31/2020    ECHOCARDIOGRAM REPORT   Patient Name:   Kirsten Jensen Date of Exam: 10/31/2020 Medical Rec #:  11/02/2020    Height:       67.0 in Accession #:    [id number]   Weight:       318.8 lb Date of Birth:  [birth year]     BSA:          2.465 m Patient Age:    86 years     BP:           110/76 mmHg Patient Gender: F            HR:           96 bpm. Exam Location:  Inpatient Procedure: 2D Echo Indications:    acute diastolic chf 428.31  History:        Patient has prior history of Echocardiogram  examinations, most                 recent 04/06/2020. Pacemaker, Arrythmias:Atrial Fibrillation and                 Atrial Flutter; Risk Factors:Hypertension and Diabetes.  Sonographer:    Delcie Roch Referring Phys: [id number] CAROLE N HALL  Sonographer Comments: Patient is morbidly obese. Image acquisition challenging due to patient body habitus. IMPRESSIONS  1. Left ventricular ejection fraction, by estimation, is 55 to 60%. The left ventricle has normal function. The left ventricle has no regional wall motion abnormalities. There is mild concentric left ventricular hypertrophy. Left ventricular diastolic function could not be evaluated.  2. Right ventricular systolic function is normal. The right ventricular size is normal. There is moderately elevated pulmonary artery systolic pressure. The estimated right ventricular systolic pressure is 58.8 mmHg.  3. The mitral valve is degenerative. Mild mitral valve regurgitation.  4. Tricuspid valve regurgitation is mild to moderate.  5. The aortic valve is tricuspid. There is mild calcification of the aortic valve. There is mild thickening of the aortic valve. Aortic valve regurgitation is not visualized. Mild aortic valve sclerosis is present, with no evidence of aortic valve stenosis.  6. The inferior vena cava is dilated in size with <50% respiratory variability, suggesting right atrial pressure of 15 mmHg. Comparison(s): Changes from prior study  are noted. EF 55-60%. Moderately elevated pulmonary pressures. FINDINGS  Left Ventricle: Left ventricular ejection fraction, by estimation, is 55 to 60%. The left ventricle has normal function. The left ventricle has no regional wall motion abnormalities. The left ventricular internal cavity size was normal in size. There is  mild concentric left ventricular hypertrophy. Left ventricular diastolic function could not be evaluated due to atrial fibrillation. Left ventricular diastolic function could not be evaluated. Right Ventricle: The right ventricular size is normal. No increase in right ventricular wall thickness. Right ventricular systolic function is normal. There is moderately elevated pulmonary artery systolic pressure. The tricuspid regurgitant velocity is 3.31 m/s, and with an assumed right atrial pressure of 15 mmHg, the estimated right ventricular systolic pressure is 58.8 mmHg. Left Atrium: Left atrial size was normal in size. Right Atrium: Right atrial size was normal in size. Pericardium: Trivial pericardial effusion is present. Mitral Valve: The mitral valve is degenerative in appearance. Mild to moderate mitral annular calcification. Mild mitral valve regurgitation. Tricuspid Valve: The tricuspid valve is grossly normal. Tricuspid valve regurgitation is mild to moderate. No evidence of tricuspid stenosis. Aortic Valve: The aortic valve is tricuspid. There is mild calcification of the aortic valve. There is mild thickening of the aortic valve. Aortic valve regurgitation is not visualized. Mild aortic valve sclerosis is present, with no evidence of aortic valve stenosis. Pulmonic Valve: The pulmonic valve was grossly normal. Pulmonic valve regurgitation is not visualized. No evidence of pulmonic stenosis. Aorta: The aortic root and ascending aorta are structurally normal, with no evidence of dilitation. Venous: The inferior vena cava is dilated in size with less than 50% respiratory variability,  suggesting right atrial pressure of 15 mmHg. IAS/Shunts: The atrial septum is grossly normal. Additional Comments: A pacer wire is visualized in the right atrium and right ventricle.  LEFT VENTRICLE PLAX 2D LVIDd:         4.70 cm LVIDs:         4.10 cm LV PW:         1.30 cm LV IVS:        1.20 cm  IVC IVC diam: 2.60 cm LEFT ATRIUM             Index       RIGHT ATRIUM           Index LA diam:        4.30 cm 1.74 cm/m  RA Area:     16.20 cm LA Vol (A2C):   72.4 ml 29.37 ml/m RA Volume:   41.40 ml  16.79 ml/m LA Vol (A4C):   55.5 ml 22.51 ml/m LA Biplane Vol: 65.3 ml 26.49 ml/m   AORTA Ao Asc diam: 3.40 cm TRICUSPID VALVE TR Peak grad:   43.8 mmHg TR Vmax:        331.00 cm/s Lennie Odor MD Electronically signed by Lennie Odor MD Signature Date/Time: 10/31/2020/12:35:52 PM    Final     Cardiac Studies:  ECG: afib intermittent pacing    Telemetry: afib intermittent pacing   Echo: EF 55-60%10/31/20  Medications:   . buPROPion  100 mg Oral Daily  . chlorhexidine  15 mL Mouth Rinse BID  . Chlorhexidine Gluconate Cloth  6 each Topical Q0600  . insulin aspart  0-9 Units Subcutaneous Q4H  . levothyroxine  50 mcg Oral QAC breakfast  . mouth rinse  15 mL Mouth Rinse q12n4p  . metoprolol succinate  25 mg Oral Daily  . mupirocin ointment  1 application Nasal BID  . nystatin  5 mL Oral QID  . pantoprazole  40 mg Oral BID AC  . polyethylene glycol-electrolytes  4,000 mL Oral Once  . pravastatin  80 mg Oral Daily  . vitamin B-12  1,000 mcg Oral Daily     . sodium chloride    . sodium chloride    . lactated ringers Stopped (11/01/20 0903)  . potassium chloride 10 mEq (11/02/20 1006)    Assessment/Plan:   1. CAD:  Prior stenting in [place] ? 5 years ago Currently no chest pain Plavix held due to GI bleeding echo done  04/06/20 with EF 50% no definite RWMA;s Troponin not elevated 25 ECG with afib pacing no acute changes  Increase Toprol to 50 mg daily Her stenting was years ago and with need for  DOAC chronic afib would not resume plavix   2. PPM:  Normal function f/u Graciela Husbands  3. Afib:  See above regarding rate control hold eliquis in setting of anemia ? GI bleed.  Colonoscopy today Echo  showed EF 55-60% resume eliquis once cleared by GI   4. HLD continue statin     Charlton Haws 11/02/2020, 10:16 AM

## 2020-11-02 NOTE — Progress Notes (Signed)
Attempted to give patient a breathing treatment but patient refused to keep her oxygen on. Will continue to monitor patient.

## 2020-11-02 NOTE — Progress Notes (Signed)
PROGRESS NOTE    Deah Ottaway  EVO:[id number] DOB: August 19, 1932 DOA: 10/30/2020 PCP: Melida Quitter, MD   Brief Narrative:  This 86 years old female with medical history significant for permanent A. fib on Eliquis, tachybradycardia syndrome status post pacemaker, essential hypertension, type 2 diabetes, obstructive sleep apnea on CPAP presented in the ED after she was sent by her provider at [facility] due to abnormal labs.  Her hemoglobin dropped from 11 to 8 g.  She also had a positive FOBT in the ED.   Patient was admitted for possible upper GI bleed,  started on Protonix drip.  She denies any prior history of GI bleed.  Patient also reports 14 pound weight gain associated with chest discomfort.  Eliquis was kept on hold.  Cardiology consulted recommended to continue metoprolol and proceed with EGD and colonoscopy.  Gastroenterology consulted,  Patient underwent EGD found to have esophageal plaque consistent with candidiasis, nonbleeding duodenal ulcer.  She is scheduled for colonoscopy today.   Assessment & Plan:   Active Problems:   Upper GI bleed   Chronic atrial fibrillation (HCC)   Presumed upper GI bleed:  Hemoglobin dropped from 11 g to 8 g. Positive FOBT in the ED She was started on Protonix drip Hold Eliquis, per cardiology Repeat H&H every 6 hours.  Hemoglobin remained stable @ 8.0 Obtain iron studies Transfuse hemoglobin less than 7.0 or less than 8 if symptomatic GI consulted.  Patient underwent EGD 11/20. She was found to have esophageal plaques consistent with candidiasis, nonbleeding duodenal ulcer. Continue Protonix 40 mg twice daily Patient is scheduled to have colonoscopy today. She completed golitely  Permanent A. fib on Eliquis Eliquis is currently on hold due to presumed upper GI bleed. Recommend cardiology and GI evaluation for guidance regarding future anticoagulation Patient has a CHA2DS2-VASc score of 6 Continue metoprolol for heart rate  control Continue to monitor on telemetry Resume Eliquis when cleared from GI.  Acute on chronic diastolic CHF Presented with elevated BNP greater than 600 and evidence of volume overload on exam She is on torsemide at home 20 mg daily. Received a dose of IV Lasix in the ED Held torsemide because of low blood pressure. Start strict I's and O's and daily weight Echocardiogram unremarkable EF 55 to 60%. Resume torsemide 20 mg daily  AKI on CKD 3A >> stable Baseline creatinine appears to be 1.0 with GFR of 56 Presented with creatinine of 1.3 with GFR of 39 Avoid nephrotoxins Monitor urine output Repeat renal panel  Elevated troponin suspect demand ischemia in the setting of anemia Troponin S 26 Trend troponin 26, 25, 26  Coronary artery disease status post PCI x2 Resume Plavix if okay with GI. Continue home Pravachol. Continue metoprolol.  Essential hypertension  Continue metoprolol and lisinopril. Closely monitor vital signs  Hypothyroidism TSH normal Resume home levothyroxine  Type 2 diabetes with hyperglycemia Hold off oral hypoglycemics Obtain A1c Start insulin sliding scale  Severe morbid obesity BMI 49 Management per PCP  OSA CPAP at night   DVT prophylaxis: SCDs Code Status: Full Family Communication:  No family at bed side. Disposition Plan:  Status is: Inpatient  Remains inpatient appropriate because:Inpatient level of care appropriate due to severity of illness   Dispo:  Patient From: Home  Planned Disposition: Home  Expected discharge date:  11/03/2020 Medically stable for discharge: No    Consultants:   Gastroenterology  Cardiology  Procedures: EGD and colonoscopy Antimicrobials:  Anti-infectives (From admission, onward)   None  Subjective: Patient was seen and examined at bedside.  Overnight events noted.  Denies nausea vomiting diarrhea. Patient had EGD yesterday found to have candidal esophagitis,.  She denies  any abdominal pain. She is going to have colonoscopy today.  Objective: Vitals:   11/01/20 2003 11/02/20 0359 11/02/20 0419 11/02/20 0820  BP: (!) 123/58  115/65 119/69  Pulse: 82  89 91  Resp: Temp: 97.6 F (36.4 C)  97.6 F (36.4 C) 98.5 F (36.9 C)  TempSrc: Oral  Oral Oral  SpO2: 100%  97% 98%  Weight:  (!) 142.2 kg    Height:       No intake or output data in the 24 hours ending 11/02/20 1145 Filed Weights   11/01/20 0500 11/01/20 0759 11/02/20 0359  Weight: (!) 144.2 kg (!) 144.2 kg (!) 142.2 kg    Examination:  General exam: Appears calm and comfortable  Respiratory system: Clear to auscultation. Respiratory effort normal. Cardiovascular system: S1 & S2 heard, RRR. No JVD, murmurs, rubs, gallops or clicks. No pedal edema. Gastrointestinal system: Abdomen is nondistended, soft and nontender. No organomegaly or masses felt. Normal bowel sounds heard. Central nervous system: Alert and oriented. No focal neurological deficits. Extremities: No edema, no cyanosis, no clubbing. Skin: No rashes, lesions or ulcers Psychiatry: Judgement and insight appear normal. Mood & affect appropriate.     Data Reviewed: I have personally reviewed following labs and imaging studies  CBC: Recent Labs  Lab 10/30/20 1505 10/30/20 1505 10/30/20 2027 10/30/20 2027 10/31/20 0258 10/31/20 1035 10/31/20 1611 11/01/20 0101 11/01/20 0216 11/01/20 1110 11/02/20 0619  WBC 10.8*  --  9.6  --  9.2  --   --   --   --   --   --   NEUTROABS  --   --   --   --  7.2  --   --   --   --   --   --   HGB 8.0*   < > 8.2*   < > 8.1*   < > 8.1* 7.6* 7.8* 8.0* 7.8*  HCT 26.7*   < > 26.7*   < > 25.9*   < > 26.1* 25.1* 25.3* 25.9* 24.8*  MCV 96.4  --  97.4  --  96.3  --   --   --   --   --   --   PLT 382  --  399  --  374  --   --   --   --   --   --    < > = values in this interval not displayed.   Basic Metabolic Panel: Recent Labs  Lab 10/30/20 1505 10/30/20 2027 10/31/20 0258  11/01/20 0216 11/02/20 0619  NA 136 137 138 137 139  K 3.9 3.6 3.6 3.5 3.2*  CL 99 100 100 100 99  CO2 GLUCOSE 319* 288* 246* 192* 147*  BUN 40* 39* 37* 26* 18  CREATININE 1.32* 1.33* 1.25* 1.02* 0.91  CALCIUM 9.3 9.3 9.1 8.7* 8.8*  MG  --   --   --  1.3* 1.8  PHOS  --   --   --  2.4* 2.8   GFR: Estimated Creatinine Clearance: 65.7 mL/min (by C-G formula based on SCr of 0.91 mg/dL). Liver Function Tests: Recent Labs  Lab 10/30/20 2027 10/31/20 0258  AST 81* 71*  ALT 50* 47*  ALKPHOS 156* 155*  BILITOT 0.7 0.7  PROT 6.8 6.4*  ALBUMIN 3.2* 3.1*   No results for input(s): LIPASE, AMYLASE in the last 168 hours. No results for input(s): AMMONIA in the last 168 hours. Coagulation Profile: Recent Labs  Lab 10/31/20 0041  INR 2.0*   Cardiac Enzymes: No results for input(s): CKTOTAL, CKMB, CKMBINDEX, TROPONINI in the last 168 hours. BNP (last 3 results) No results for input(s): PROBNP in the last 8760 hours. HbA1C: Recent Labs    10/31/20 0258  HGBA1C 7.9*   CBG: Recent Labs  Lab 11/01/20 2034 11/02/20 0015 11/02/20 0350 11/02/20 0415 11/02/20 0815  GLUCAP 212* 150* 137* 145* 167*   Lipid Profile: No results for input(s): CHOL, HDL, LDLCALC, TRIG, CHOLHDL, LDLDIRECT in the last 72 hours. Thyroid Function Tests: Recent Labs    10/31/20 0258  TSH 1.050   Anemia Panel: Recent Labs    10/31/20 0258  VITAMINB12 1,164*  FOLATE 6.6  FERRITIN 25  TIBC 434  IRON 23*   Sepsis Labs: No results for input(s): PROCALCITON, LATICACIDVEN in the last 168 hours.  Recent Results (from the past 240 hour(s))  Respiratory Panel by RT PCR (Flu A&B, Covid) - Nasopharyngeal Swab     Status: None   Collection Time: 10/30/20 10:52 PM   Specimen: Nasopharyngeal Swab; Nasopharyngeal(NP) swabs in vial transport medium  Result Value Ref Range Status   SARS Coronavirus 2 by RT PCR NEGATIVE NEGATIVE Final    Comment: (NOTE) SARS-CoV-2 target nucleic acids are  NOT DETECTED.  The SARS-CoV-2 RNA is generally detectable in upper respiratoy specimens during the acute phase of infection. The lowest concentration of SARS-CoV-2 viral copies this assay can detect is 131 copies/mL. A negative result does not preclude SARS-Cov-2 infection and should not be used as the sole basis for treatment or other patient management decisions. A negative result may occur with  improper specimen collection/handling, submission of specimen other than nasopharyngeal swab, presence of viral mutation(s) within the areas targeted by this assay, and inadequate number of viral copies (<131 copies/mL). A negative result must be combined with clinical observations, patient history, and epidemiological information. The expected result is Negative.  Fact Sheet for Patients:  https://www.moore.com/  Fact Sheet for Healthcare Providers:  https://www.young.biz/  This test is no t yet approved or cleared by the [place] FDA and  has been authorized for detection and/or diagnosis of SARS-CoV-2 by FDA under an Emergency Use Authorization (EUA). This EUA will remain  in effect (meaning this test can be used) for the duration of the COVID-19 declaration under Section 564(b)(1) of the Act, 21 U.S.C. section 360bbb-3(b)(1), unless the authorization is terminated or revoked sooner.     Influenza A by PCR NEGATIVE NEGATIVE Final   Influenza B by PCR NEGATIVE NEGATIVE Final    Comment: (NOTE) The Xpert Xpress SARS-CoV-2/FLU/RSV assay is intended as an aid in  the diagnosis of influenza from Nasopharyngeal swab specimens and  should not be used as a sole basis for treatment. Nasal washings and  aspirates are unacceptable for Xpert Xpress SARS-CoV-2/FLU/RSV  testing.  Fact Sheet for Patients: https://www.moore.com/  Fact Sheet for Healthcare Providers: https://www.young.biz/  This test is not yet  approved or cleared by the [place] FDA and  has been authorized for detection and/or diagnosis of SARS-CoV-2 by  FDA under an Emergency Use Authorization (EUA). This EUA will remain  in effect (meaning this test can be used) for the duration of the  Covid-19 declaration under Section 564(b)(1) of the Act, 21  U.S.C. section  360bbb-3(b)(1), unless the authorization is  terminated or revoked. Performed at [facility], [address] [place], [place] [address]   Surgical pcr screen     Status: Abnormal   Collection Time: 11/01/20  5:02 AM   Specimen: Nasal Mucosa; Nasal Swab  Result Value Ref Range Status   MRSA, PCR NEGATIVE NEGATIVE Final   Staphylococcus aureus POSITIVE (A) NEGATIVE Final    Comment: (NOTE) The Xpert SA Assay (FDA approved for NASAL specimens in patients 84 years of age and older), is one component of a comprehensive surveillance program. It is not intended to diagnose infection nor to guide or monitor treatment. Performed at [facility], [address] [place], [place] [address]     Radiology Studies: ECHOCARDIOGRAM COMPLETE  Result Date: 10/31/2020    ECHOCARDIOGRAM REPORT   Patient Name:   Vennie HomansCAROL Consolo Date of Exam: 10/31/2020 Medical Rec #:  [id number]    Height:       67.0 in Accession #:    [id number]   Weight:       318.8 lb Date of Birth:  05/29/1932     BSA:          2.465 m Patient Age:    88 years     BP:           110/76 mmHg Patient Gender: F            HR:           96 bpm. Exam Location:  Inpatient Procedure: 2D Echo Indications:    acute diastolic chf 428.31  History:        Patient has prior history of Echocardiogram examinations, most                 recent 04/06/2020. Pacemaker, Arrythmias:Atrial Fibrillation and                 Atrial Flutter; Risk Factors:Hypertension and Diabetes.  Sonographer:    Delcie RochLauren Pennington Referring Phys: [id number] CAROLE N HALL  Sonographer Comments: Patient is morbidly obese. Image acquisition challenging  due to patient body habitus. IMPRESSIONS  1. Left ventricular ejection fraction, by estimation, is 55 to 60%. The left ventricle has normal function. The left ventricle has no regional wall motion abnormalities. There is mild concentric left ventricular hypertrophy. Left ventricular diastolic function could not be evaluated.  2. Right ventricular systolic function is normal. The right ventricular size is normal. There is moderately elevated pulmonary artery systolic pressure. The estimated right ventricular systolic pressure is 58.8 mmHg.  3. The mitral valve is degenerative. Mild mitral valve regurgitation.  4. Tricuspid valve regurgitation is mild to moderate.  5. The aortic valve is tricuspid. There is mild calcification of the aortic valve. There is mild thickening of the aortic valve. Aortic valve regurgitation is not visualized. Mild aortic valve sclerosis is present, with no evidence of aortic valve stenosis.  6. The inferior vena cava is dilated in size with <50% respiratory variability, suggesting right atrial pressure of 15 mmHg. Comparison(s): Changes from prior study are noted. EF 55-60%. Moderately elevated pulmonary pressures. FINDINGS  Left Ventricle: Left ventricular ejection fraction, by estimation, is 55 to 60%. The left ventricle has normal function. The left ventricle has no regional wall motion abnormalities. The left ventricular internal cavity size was normal in size. There is  mild concentric left ventricular hypertrophy. Left ventricular diastolic function could not be evaluated due to atrial fibrillation. Left ventricular diastolic function could not  be evaluated. Right Ventricle: The right ventricular size is normal. No increase in right ventricular wall thickness. Right ventricular systolic function is normal. There is moderately elevated pulmonary artery systolic pressure. The tricuspid regurgitant velocity is 3.31 m/s, and with an assumed right atrial pressure of 15 mmHg, the estimated  right ventricular systolic pressure is 58.8 mmHg. Left Atrium: Left atrial size was normal in size. Right Atrium: Right atrial size was normal in size. Pericardium: Trivial pericardial effusion is present. Mitral Valve: The mitral valve is degenerative in appearance. Mild to moderate mitral annular calcification. Mild mitral valve regurgitation. Tricuspid Valve: The tricuspid valve is grossly normal. Tricuspid valve regurgitation is mild to moderate. No evidence of tricuspid stenosis. Aortic Valve: The aortic valve is tricuspid. There is mild calcification of the aortic valve. There is mild thickening of the aortic valve. Aortic valve regurgitation is not visualized. Mild aortic valve sclerosis is present, with no evidence of aortic valve stenosis. Pulmonic Valve: The pulmonic valve was grossly normal. Pulmonic valve regurgitation is not visualized. No evidence of pulmonic stenosis. Aorta: The aortic root and ascending aorta are structurally normal, with no evidence of dilitation. Venous: The inferior vena cava is dilated in size with less than 50% respiratory variability, suggesting right atrial pressure of 15 mmHg. IAS/Shunts: The atrial septum is grossly normal. Additional Comments: A pacer wire is visualized in the right atrium and right ventricle.  LEFT VENTRICLE PLAX 2D LVIDd:         4.70 cm LVIDs:         4.10 cm LV PW:         1.30 cm LV IVS:        1.20 cm  IVC IVC diam: 2.60 cm LEFT ATRIUM             Index       RIGHT ATRIUM           Index LA diam:        4.30 cm 1.74 cm/m  RA Area:     16.20 cm LA Vol (A2C):   72.4 ml 29.37 ml/m RA Volume:   41.40 ml  16.79 ml/m LA Vol (A4C):   55.5 ml 22.51 ml/m LA Biplane Vol: 65.3 ml 26.49 ml/m   AORTA Ao Asc diam: 3.40 cm TRICUSPID VALVE TR Peak grad:   43.8 mmHg TR Vmax:        331.00 cm/s Lennie Odor MD Electronically signed by Lennie Odor MD Signature Date/Time: 10/31/2020/12:35:52 PM    Final    Scheduled Meds: . buPROPion  100 mg Oral Daily  .  chlorhexidine  15 mL Mouth Rinse BID  . Chlorhexidine Gluconate Cloth  6 each Topical Q0600  . insulin aspart  0-9 Units Subcutaneous Q4H  . levothyroxine  50 mcg Oral QAC breakfast  . mouth rinse  15 mL Mouth Rinse q12n4p  . [START ON 11/03/2020] metoprolol succinate  50 mg Oral Daily  . mupirocin ointment  1 application Nasal BID  . nystatin  5 mL Oral QID  . pantoprazole  40 mg Oral BID AC  . polyethylene glycol-electrolytes  4,000 mL Oral Once  . pravastatin  80 mg Oral Daily  . vitamin B-12  1,000 mcg Oral Daily   Continuous Infusions: . sodium chloride    . sodium chloride    . lactated ringers Stopped (11/01/20 0903)     LOS: 2 days    Time spent: 25 mins.    Cipriano Bunker, MD [facility]  If 7PM-7AM, please contact night-coverage

## 2020-11-02 NOTE — Anesthesia Preprocedure Evaluation (Addendum)
Anesthesia Evaluation  Patient identified by MRN, date of birth, ID band Patient awake    Reviewed: Allergy & Precautions, H&P , NPO status , Patient's Chart, lab work & pertinent test results, reviewed documented beta blocker date and time   Airway Mallampati: III  TM Distance: >3 FB Neck ROM: Full    Dental no notable dental hx. (+) Partial Lower, Partial Upper, Dental Advisory Given   Pulmonary neg pulmonary ROS,    Pulmonary exam normal breath sounds clear to auscultation       Cardiovascular hypertension, Pt. on medications and Pt. on home beta blockers + CAD and + Cardiac Stents  + dysrhythmias Atrial Fibrillation + pacemaker  Rhythm:Irregular Rate:Normal     Neuro/Psych negative neurological ROS  negative psych ROS   GI/Hepatic negative GI ROS, Neg liver ROS,   Endo/Other  diabetes, Type 2, Oral Hypoglycemic AgentsMorbid obesity  Renal/GU negative Renal ROS  negative genitourinary   Musculoskeletal   Abdominal   Peds  Hematology negative hematology ROS (+)   Anesthesia Other Findings   Reproductive/Obstetrics negative OB ROS                            Anesthesia Physical Anesthesia Plan  ASA: III  Anesthesia Plan: MAC   Post-op Pain Management:    Induction: Intravenous  PONV Risk Score and Plan: 2 and Propofol infusion and Treatment may vary due to age or medical condition  Airway Management Planned: Simple Face Mask  Additional Equipment:   Intra-op Plan:   Post-operative Plan:   Informed Consent: I have reviewed the patients History and Physical, chart, labs and discussed the procedure including the risks, benefits and alternatives for the proposed anesthesia with the patient or authorized representative who has indicated his/her understanding and acceptance.     Dental advisory given  Plan Discussed with: CRNA  Anesthesia Plan Comments:          Anesthesia Quick Evaluation

## 2020-11-02 NOTE — Progress Notes (Signed)
Kirsten Jensen 2:10 PM  Subjective: Patient without GI complaints and did well with her prep no problems from her endoscopy and no signs of bleeding  Objective: No signs stable afebrile exam please see preassessment evaluation BUN and creatinine normal hemoglobin stable Assessment: Guaiac positive anemia on blood thinners  Plan: Okay to proceed with colonoscopy with anesthesia assistance with further work-up and plans pending those findings  [facility]  office [phone] After 5PM or if no answer call [phone]

## 2020-11-02 NOTE — Plan of Care (Signed)
Patient is alert and oriented x4. Stools not clear, and Mag citrate administered per order. Patient also with a persistent cough,  mucinex administered. NPO for colonoscopy. Problem: Education: Goal: Knowledge of General Education information will improve Description: Including pain rating scale, medication(s)/side effects and non-pharmacologic comfort measures Outcome: Progressing   Problem: Health Behavior/Discharge Planning: Goal: Ability to manage health-related needs will improve Outcome: Progressing   Problem: Clinical Measurements: Goal: Ability to maintain clinical measurements within normal limits will improve Outcome: Progressing Goal: Diagnostic test results will improve Outcome: Progressing Goal: Respiratory complications will improve Outcome: Progressing Goal: Cardiovascular complication will be avoided Outcome: Progressing   Problem: Activity: Goal: Risk for activity intolerance will decrease Outcome: Progressing   Problem: Nutrition: Goal: Adequate nutrition will be maintained Outcome: Progressing   Problem: Coping: Goal: Level of anxiety will decrease Outcome: Progressing   Problem: Elimination: Goal: Will not experience complications related to bowel motility Outcome: Progressing Goal: Will not experience complications related to urinary retention Outcome: Progressing   Problem: Pain Managment: Goal: General experience of comfort will improve Outcome: Progressing   Problem: Safety: Goal: Ability to remain free from injury will improve Outcome: Progressing   Problem: Skin Integrity: Goal: Risk for impaired skin integrity will decrease Outcome: Progressing

## 2020-11-02 NOTE — Plan of Care (Signed)
Pt did well with procedure today. She is tolerating food now on a soft diet. She did not complain of pain during my shift. Will continue to monitor.   Problem: Education: Goal: Knowledge of General Education information will improve Description: Including pain rating scale, medication(s)/side effects and non-pharmacologic comfort measures Outcome: Progressing   Problem: Health Behavior/Discharge Planning: Goal: Ability to manage health-related needs will improve Outcome: Progressing   Problem: Activity: Goal: Risk for activity intolerance will decrease Outcome: Progressing   Problem: Nutrition: Goal: Adequate nutrition will be maintained Outcome: Progressing   Problem: Coping: Goal: Level of anxiety will decrease Outcome: Progressing

## 2020-11-02 NOTE — Op Note (Signed)
[facility] Patient Name: Kirsten HomansCarol Cange Procedure Date : 11/02/2020 MRN: [id number] Attending MD: Vida RiggerMarc Titianna Loomis , MD Date of Birth: 03/27/1932 CSN: [id number] Age: [age] Admit Type: Inpatient Procedure:                Colonoscopy Indications:              Last colonoscopy: date unknown (unable to locate                            last [place]                            greater than 10 years ago), Melena, Iron deficiency                            anemia secondary to chronic blood loss on multiple                            blood thinners Providers:                Vida RiggerMarc Lakendra Helling, MD, Rogue JurySandy Andrews, RN, Sunday CornFaustina                            Mbumina, Technician Referring MD:              Medicines:                Propofol total dose 200 mg IV Complications:            Hypoxia and respiratory depression helped by                            increased ventilation by the anesthesia team and                            turning off sedation Estimated Blood Loss:     Estimated blood loss: none. Procedure:                Pre-Anesthesia Assessment:                           - Prior to the procedure, a History and Physical                            was performed, and patient medications and                            allergies were reviewed. The patient's tolerance of                            previous anesthesia was also reviewed. The risks                            and benefits of the procedure and the sedation  options and risks were discussed with the patient.                            All questions were answered, and informed consent                            was obtained. Prior Anticoagulants: The patient has                            taken Eliquis (apixaban), last dose was 4 days                            prior to procedure. ASA Grade Assessment: III - A                            patient with severe systemic disease.  After                            reviewing the risks and benefits, the patient was                            deemed in satisfactory condition to undergo the                            procedure.                           After obtaining informed consent, the colonoscope                            was passed under direct vision. Throughout the                            procedure, the patient's blood pressure, pulse, and                            oxygen saturations were monitored continuously. The                            CF-HQ190L ([id number] Olympus colonoscope was                            introduced through the anus and advanced to the the                            terminal ileum. The terminal ileum, ileocecal                            valve, appendiceal orifice, and rectum were                            photographed. The colonoscopy was somewhat  difficult due to significant looping, a tortuous                            colon and the patient's body habitus. Successful                            completion of the procedure was aided by applying                            abdominal pressure. The patient tolerated the                            procedure fairly well. The quality of the bowel                            preparation was adequate. Scope In: 2:35:52 PM Scope Out: 2:47:25 PM Total Procedure Duration: 0 hours 11 minutes 33 seconds  Findings:      External and internal hemorrhoids were found during retroflexion, during       perianal exam and during digital exam. The hemorrhoids were medium-sized.      Scattered small and large-mouthed diverticula were found in the sigmoid       colon.      Two semi-sessile polyps were found in the transverse colon and ascending       colon. The polyps were small in size. There were not biopsied      The terminal ileum appeared normal. No blood seen coming from above      The exam was otherwise without  abnormality. Impression:               - External and internal hemorrhoids.                           - Diverticulosis in the sigmoid colon.                           - Two small polyps in the transverse colon and in                            the ascending colon.                           - The examined portion of the ileum was normal.                           - The examination was otherwise normal.                           - No specimens collected. Recommendation:           - Soft diet today.                           - Continue present medications. Reevaluate her                            blood thinner needs and question  whether she needs                            2 different blood thinners and warned about aspirin                            and nonsteroidals as well                           - Repeat colonoscopy PRN for screening purposes.                           - Return to GI office in 2 weeks. Consider CT scan                            to rule out anything significant and a capsule                            endoscopy next if signs of ongoing bleeding but                            hopefully can go home soon with close office                            follow-up                           - Telephone GI clinic if symptomatic PRN. And would                            touch base with primary care to get her last CBC                            report to see how much blood she actually lost over                            the last few months Procedure Code(s):        --- Professional ---                           [phone], Colonoscopy, flexible; diagnostic, including                            collection of specimen(s) by brushing or washing,                            when performed (separate procedure) Diagnosis Code(s):        --- Professional ---                           K63.5, Polyp of colon                           K92.1, Melena (includes Hematochezia)  D50.0, Iron deficiency anemia secondary to blood                            loss (chronic)                           K57.30, Diverticulosis of large intestine without                            perforation or abscess without bleeding CPT copyright 2019 [employer]. All rights reserved. The codes documented in this report are preliminary and upon coder review may  be revised to meet current compliance requirements. Vida Rigger, MD 11/02/2020 3:10:16 PM This report has been signed electronically. Number of Addenda: 0

## 2020-11-02 NOTE — Anesthesia Postprocedure Evaluation (Signed)
Anesthesia Post Note  Patient: Kirsten Jensen  Procedure(s) Performed: COLONOSCOPY (N/A )     Patient location during evaluation: PACU Anesthesia Type: MAC Level of consciousness: awake and alert Pain management: pain level controlled Vital Signs Assessment: post-procedure vital signs reviewed and stable Respiratory status: spontaneous breathing, nonlabored ventilation, respiratory function stable and patient connected to nasal cannula oxygen Cardiovascular status: stable and blood pressure returned to baseline Postop Assessment: no apparent nausea or vomiting Anesthetic complications: no   No complications documented.  Last Vitals:  Vitals:   11/02/20 1545 11/02/20 1617  BP: 125/60 140/83  Pulse: (!) 112 90  Resp: 15 17  Temp:  36.6 C  SpO2: 94% 96%    Last Pain:  Vitals:   11/02/20 1617  TempSrc: Oral  PainSc:                  [name],W. EDMOND

## 2020-11-02 NOTE — Progress Notes (Signed)
Patient received her nebulizer treatment.

## 2020-11-02 NOTE — Transfer of Care (Signed)
Immediate Anesthesia Transfer of Care Note  Patient: Kirsten Jensen  Procedure(s) Performed: COLONOSCOPY (N/A )  Patient Location: PACU  Anesthesia Type:MAC  Level of Consciousness: awake, alert  and oriented  Airway & Oxygen Therapy: Patient Spontanous Breathing and Patient connected to face mask oxygen  Post-op Assessment: Report given to RN and Post -op Vital signs reviewed and stable  Post vital signs: Reviewed and stable  Last Vitals:  Vitals Value Taken Time  BP 141/61 11/02/20 1510  Temp    Pulse 59 11/02/20 1513  Resp 31 11/02/20 1513  SpO2 88 % 11/02/20 1513  Vitals shown include unvalidated device data.  Last Pain:  Vitals:   11/02/20 1500  TempSrc:   PainSc: 0-No pain         Complications: No complications documented.

## 2020-11-02 NOTE — Progress Notes (Signed)
Discussed case with son and answered all of his questions and consider a unit or 2 more before discharge and the most important thing I think is to determine what blood thinners she needs going forward but probably both Eliquis and Plavix is too much and we are happy to see back in the office in a few weeks to decide if CT scan capsule endoscopy etc. as needed and will try to get primary care doctors last few CBCs to see if anemia is playing a role with her shortness of breath

## 2020-11-03 ENCOUNTER — Inpatient Hospital Stay (HOSPITAL_COMMUNITY): Payer: Medicare Other

## 2020-11-03 ENCOUNTER — Encounter (HOSPITAL_COMMUNITY): Payer: Self-pay | Admitting: Gastroenterology

## 2020-11-03 DIAGNOSIS — K922 Gastrointestinal hemorrhage, unspecified: Secondary | ICD-10-CM | POA: Diagnosis not present

## 2020-11-03 DIAGNOSIS — I482 Chronic atrial fibrillation, unspecified: Secondary | ICD-10-CM | POA: Diagnosis not present

## 2020-11-03 LAB — GLUCOSE, CAPILLARY
Glucose-Capillary: 131 mg/dL — ABNORMAL HIGH (ref 70–99)
Glucose-Capillary: 189 mg/dL — ABNORMAL HIGH (ref 70–99)
Glucose-Capillary: 229 mg/dL — ABNORMAL HIGH (ref 70–99)
Glucose-Capillary: 263 mg/dL — ABNORMAL HIGH (ref 70–99)
Glucose-Capillary: 277 mg/dL — ABNORMAL HIGH (ref 70–99)
Glucose-Capillary: 281 mg/dL — ABNORMAL HIGH (ref 70–99)
Glucose-Capillary: 307 mg/dL — ABNORMAL HIGH (ref 70–99)

## 2020-11-03 LAB — CBC
HCT: 27.7 % — ABNORMAL LOW (ref 36.0–46.0)
Hemoglobin: 8.4 g/dL — ABNORMAL LOW (ref 12.0–15.0)
MCH: 29 pg (ref 26.0–34.0)
MCHC: 30.3 g/dL (ref 30.0–36.0)
MCV: 95.5 fL (ref 80.0–100.0)
Platelets: 347 10*3/uL (ref 150–400)
RBC: 2.9 MIL/uL — ABNORMAL LOW (ref 3.87–5.11)
RDW: 15 % (ref 11.5–15.5)
WBC: 9.1 10*3/uL (ref 4.0–10.5)
nRBC: 0 % (ref 0.0–0.2)

## 2020-11-03 LAB — BASIC METABOLIC PANEL
Anion gap: 13 (ref 5–15)
BUN: 15 mg/dL (ref 8–23)
CO2: 26 mmol/L (ref 22–32)
Calcium: 9 mg/dL (ref 8.9–10.3)
Chloride: 99 mmol/L (ref 98–111)
Creatinine, Ser: 0.96 mg/dL (ref 0.44–1.00)
GFR, Estimated: 58 mL/min — ABNORMAL LOW (ref >60)
Glucose, Bld: 261 mg/dL — ABNORMAL HIGH (ref 70–99)
Potassium: 3.5 mmol/L (ref 3.5–5.1)
Sodium: 138 mmol/L (ref 135–145)

## 2020-11-03 LAB — MAGNESIUM: Magnesium: 1.8 mg/dL (ref 1.7–2.4)

## 2020-11-03 LAB — PHOSPHORUS: Phosphorus: 2.9 mg/dL (ref 2.5–4.6)

## 2020-11-03 LAB — PREPARE RBC (CROSSMATCH)

## 2020-11-03 LAB — HEMOGLOBIN AND HEMATOCRIT, BLOOD
HCT: 26.3 % — ABNORMAL LOW (ref 36.0–46.0)
Hemoglobin: 8.2 g/dL — ABNORMAL LOW (ref 12.0–15.0)

## 2020-11-03 MED ORDER — GUAIFENESIN ER 600 MG PO TB12
600.0000 mg | ORAL_TABLET | Freq: Two times a day (BID) | ORAL | Status: DC | PRN
  Administered 2020-11-04 – 2020-11-08 (×6): 600 mg via ORAL
  Filled 2020-11-03 (×8): qty 1

## 2020-11-03 MED ORDER — IOHEXOL 9 MG/ML PO SOLN
500.0000 mL | ORAL | Status: AC
  Administered 2020-11-03 (×2): 500 mL via ORAL

## 2020-11-03 MED ORDER — IOHEXOL 300 MG/ML  SOLN
100.0000 mL | Freq: Once | INTRAMUSCULAR | Status: AC | PRN
  Administered 2020-11-04: 100 mL via INTRAVENOUS

## 2020-11-03 MED ORDER — INFLUENZA VAC A&B SA ADJ QUAD 0.5 ML IM PRSY
0.5000 mL | PREFILLED_SYRINGE | INTRAMUSCULAR | Status: DC
  Filled 2020-11-03: qty 0.5

## 2020-11-03 MED ORDER — SODIUM CHLORIDE 0.9% IV SOLUTION: Freq: Once | INTRAVENOUS | Status: AC

## 2020-11-03 MED ORDER — ACETAMINOPHEN-CODEINE #3 300-30 MG PO TABS
1.0000 | ORAL_TABLET | Freq: Once | ORAL | Status: AC
  Administered 2020-11-03: 1 via ORAL
  Filled 2020-11-03: qty 1

## 2020-11-03 NOTE — Progress Notes (Signed)
[facility] Progress Note  Kirsten Jensen 86 y.o. 1932/07/20  CC:  Iron deficiency anemia  Subjective: Patient reports feeling better today. Denies any complaints. Denies abdominal pain, nausea/vomiting, melena, or hematochezia. Denies chest pain, states shortness of breath is improving.  ROS : Review of Systems  Respiratory: Positive for shortness of breath. Negative for cough.   Cardiovascular: Negative for chest pain.  Gastrointestinal: Negative for abdominal pain, blood in stool, constipation, diarrhea, heartburn, melena, nausea and vomiting.   Objective: Vital signs in last 24 hours: Vitals:   11/03/20 0256 11/03/20 0750  BP: 117/63 103/73  Pulse: 76 76  Resp: 16 16  Temp: 98.3 F (36.8 C) 98.3 F (36.8 C)  SpO2: 97% 98%    Physical Exam:  General:  Alert, cooperative, no distress, obese  Head:  Normocephalic, without obvious abnormality, atraumatic  Eyes:  Anicteric sclera, EOMs intact   Lungs:   Clear to auscultation bilaterally, respirations unlabored  Heart:  Regular rate, irregular rhythm  Abdomen:   Soft, non-tender,non-distended, bowel sounds active all four quadrants, no guarding or peritoneal signs  Extremities: Mild bilateral lower extremity edema  Pulses: 2+ and symmetric    Lab Results: Recent Labs    11/01/20 0216 11/02/20 0619  NA 137 139  K 3.5 3.2*  CL 100 99  CO2 26 30  GLUCOSE 192* 147*  BUN 26* 18  CREATININE 1.02* 0.91  CALCIUM 8.7* 8.8*  MG 1.3* 1.8  PHOS 2.4* 2.8   No results for input(s): AST, ALT, ALKPHOS, BILITOT, PROT, ALBUMIN in the last 72 hours. Recent Labs    11/01/20 1110 11/02/20 0619  HGB 8.0* 7.8*  HCT 25.9* 24.8*   No results for input(s): LABPROT, INR in the last 72 hours.    Assessment: Anemia with heme positive stools in the setting of Eliquis and Plavix use.  No frank GI bleeding. -Hemoglobin 7.8 yesterday, today's Hgb pending -EGD and colonoscopy unrevealing for source of anemia.  (EGD:  Non-bleeding duodenal ulcer with no stigmata of bleeding, small HH, esophageal candidiasis; colonoscopy: 2 small polyps, diverticulosis, internal hemorrhoids)  A. fib, on Eliquis, was also on Plavix at home. Per cardiology note, plan to d/c Plavix.  Acute on chronic CHF, CAD s/p stenting, pacemaker  CKD  Plan: Protonix 40 mg PO BID x 6-8 weeks, then once daily indefinitely.  Repeat CBC today.  CT abdomen/pelvis to rule out mass lesions as source of anemia.  Patient had breakfast, so cannot complete a capsule endoscopy today, though would consider if anemia persists.  Continue to monitor H&H with transfusion as needed to maintain hemoglobin greater than 7.  Edrick Kins PA-C 11/03/2020, 10:54 AM  Contact #  [phone]

## 2020-11-03 NOTE — Progress Notes (Signed)
Inpatient Diabetes Program Recommendations  AACE/ADA: New Consensus Statement on Inpatient Glycemic Control (2015)  Target Ranges:  Prepandial:   less than 140 mg/dL      Peak postprandial:   less than 180 mg/dL (1-2 hours)      Critically ill patients:  140 - 180 mg/dL   Lab Results  Component Value Date   GLUCAP 263 (H) 11/03/2020   HGBA1C 7.9 (H) 10/31/2020    Review of Glycemic Control Results for Kirsten Jensen, Kirsten Jensen (MRN [id number]) as of 11/03/2020 11:42  Ref. Range 11/02/2020 20:54 11/03/2020 00:09 11/03/2020 04:35 11/03/2020 08:14  Glucose-Capillary Latest Ref Range: 70 - 99 mg/dL 165 (H) 790 (H) 383 (H) 263 (H)   Diabetes history: Type 2 DM Outpatient Diabetes medications: Metformin 1000 mg BID, Glipizide 2.5 mg QAM Current orders for Inpatient glycemic control: Novolog 0-9 units Q4H  Inpatient Diabetes Program Recommendations:    Consider adding Levemir 15 units QD.   Thanks, Lujean Rave, MSN, RNC-OB Diabetes Coordinator [phone] (8a-5p)

## 2020-11-03 NOTE — Progress Notes (Signed)
PT Cancellation Note  Patient Details Name: Chemika Nightengale MRN: [id number] DOB: 1932/05/05   Cancelled Treatment:    Reason Eval/Treat Not Completed: Medical issues which prohibited therapy Received imminent discharge order and chart reviewed.  Noted that d/c is not planned for today.  Went to check on pt and RN just started PRBC infusion and reports IV site is tenuous and does not want to risk losing site while pt getting blood.  PT agrees.  Will f/u as able. Anise Salvo, PT Acute Rehab Services Pager [phone] [facility] [phone]   Rayetta Humphrey 11/03/2020, 4:43 PM

## 2020-11-03 NOTE — Progress Notes (Signed)
Pt iv infiltrated during the blood transfusion.  Very edematous,  Put an ice pack on and elevated it.

## 2020-11-03 NOTE — Progress Notes (Signed)
PROGRESS NOTE    Kirsten Jensen  BZJ:[id number] DOB: 1932/03/03 DOA: 10/30/2020 PCP: Melida Quitter, MD   Brief Narrative:  This 86 years old female with medical history significant for permanent A. fib on Eliquis, tachybradycardia syndrome status post pacemaker, essential hypertension, type 2 diabetes, obstructive sleep apnea on CPAP presented in the ED after she was sent by her provider at [facility] due to abnormal labs.  Her hemoglobin dropped from 11 to 8 g.  She also had a positive FOBT in the ED.   Patient was admitted for possible upper GI bleed,  started on Protonix drip.  She denies any prior history of GI bleed.  Patient also reports 14 pound weight gain associated with chest discomfort.  Eliquis was kept on hold.  Cardiology consulted recommended to continue metoprolol and proceed with EGD and colonoscopy.  Gastroenterology consulted,  Patient underwent EGD found to have esophageal plaque consistent with candidiasis, nonbleeding duodenal ulcer.  Patient underwent colonoscopy found to have external and internal hemorrhoids, diverticulosis in sigmoid colon and 2 small polyps in the transverse and descending colon otherwise unremarkable. GI recommended CT abdomen and pelvis to rule out other lesions.  Assessment & Plan:   Active Problems:   Upper GI bleed   Chronic atrial fibrillation (HCC)   Presumed upper GI bleed:  Hemoglobin dropped from 11 g to 8 g. Positive FOBT in the ED She was started on Protonix drip Hold Eliquis, per cardiology Repeat H&H every 6 hours.  Hemoglobin remained stable @ 8.0 Iron profile showed iron deficiency. Transfuse hemoglobin less than 7.0 or less than 8 if symptomatic GI consulted.  Patient underwent EGD 11/20. She was found to have esophageal plaques consistent with candidiasis, nonbleeding duodenal ulcer. Continue Protonix 40 mg twice daily Patient underwent colonoscopy found to have external and internal hemorrhoids, diverticulosis in sigmoid  colon and 2 small polyps in the transverse and descending colon otherwise unremarkable. GI recommended CT abdomen and pelvis to rule out other lesions. Hemoglobin dropped to 7.8,  will transfuse 1 PRBC.  Follow posttransfusion hemoglobin.  Permanent A. fib on Eliquis Eliquis is currently on hold due to presumed upper GI bleed. Recommend cardiology and GI evaluation for guidance regarding future anticoagulation Patient has a CHA2DS2-VASc score of 6 Continue metoprolol for heart rate control Continue to monitor on telemetry Resume Eliquis when cleared from GI.  Acute on chronic diastolic CHF Presented with elevated BNP greater than 600 and evidence of volume overload on exam She is on torsemide at home 20 mg daily. Received a dose of IV Lasix in the ED Held torsemide because of low blood pressure. Start strict I's and O's and daily weight Echocardiogram unremarkable EF 55 to 60%. Resume torsemide 20 mg daily.  AKI on CKD 3A >> stable Baseline creatinine appears to be 1.0 with GFR of 56 Presented with creatinine of 1.3 with GFR of 39 Avoid nephrotoxins Monitor urine output  Elevated troponin suspect demand ischemia in the setting of anemia Troponin S 26 Trend troponin 26, 25, 26  Coronary artery disease status post PCI x2 Resume Plavix if okay with GI. Continue home Pravachol. Continue metoprolol.  Essential hypertension  Continue metoprolol and lisinopril. Closely monitor vital signs  Hypothyroidism TSH normal Continue home levothyroxine  Type 2 diabetes with hyperglycemia Hold off oral hypoglycemics Hb A1c 7.9 Start insulin sliding scale  Severe morbid obesity BMI 49 Management per PCP  OSA CPAP at night   DVT prophylaxis: SCDs Code Status: Full Family Communication:  No family at bed side. Disposition Plan:  Status is: Inpatient  Remains inpatient appropriate because:Inpatient level of care appropriate due to severity of  illness   Dispo:  Patient From: Home  Planned Disposition: Home  Expected discharge date:  11/04/2020 Medically stable for discharge: No    Consultants:   Gastroenterology  Cardiology  Procedures: EGD and colonoscopy Antimicrobials:  Anti-infectives (From admission, onward)   None      Subjective: Patient was seen and examined at bedside.  Overnight events noted.   Patient is s/p colonoscopy, denies any nausea, vomiting, diarrhea.   Objective: Vitals:   11/02/20 2000 11/03/20 0256 11/03/20 0750 11/03/20 1458  BP: 133/64 117/63 103/73 (!) 141/76  Pulse: 75 76 76 83  Resp: 18 16 16 20   Temp: 98.1 F (36.7 C) 98.3 F (36.8 C) 98.3 F (36.8 C) 98.6 F (37 C)  TempSrc: Oral Oral Oral Oral  SpO2:  97% 98% 99%  Weight:      Height:        Intake/Output Summary (Last 24 hours) at 11/03/2020 1529 Last data filed at 11/03/2020 1520 Gross per 24 hour  Intake 620 ml  Output 300 ml  Net 320 ml   Filed Weights   11/01/20 0759 11/02/20 0359 11/02/20 1411  Weight: (!) 144.2 kg (!) 142.2 kg (!) 142.2 kg    Examination:  General exam: Appears calm and comfortable , not in any distress. Respiratory system: Clear to auscultation. Respiratory effort normal. Cardiovascular system: S1 & S2 heard, RRR. No JVD, murmurs, rubs, gallops or clicks. No pedal edema. Gastrointestinal system: Abdomen is nondistended, soft and nontender. No organomegaly or masses felt. Normal bowel sounds heard. Central nervous system: Alert and oriented. No focal neurological deficits. Extremities: No edema, no cyanosis, no clubbing. Skin: No rashes, lesions or ulcers Psychiatry: Judgement and insight appear normal. Mood & affect appropriate.     Data Reviewed: I have personally reviewed following labs and imaging studies  CBC: Recent Labs  Lab 10/30/20 1505 10/30/20 1505 10/30/20 2027 10/30/20 2027 10/31/20 0258 10/31/20 1035 11/01/20 0101 11/01/20 0216 11/01/20 1110 11/02/20 0619  11/03/20 0940  WBC 10.8*  --  9.6  --  9.2  --   --   --   --   --  9.1  NEUTROABS  --   --   --   --  7.2  --   --   --   --   --   --   HGB 8.0*   < > 8.2*   < > 8.1*   < > 7.6* 7.8* 8.0* 7.8* 8.4*  HCT 26.7*   < > 26.7*   < > 25.9*   < > 25.1* 25.3* 25.9* 24.8* 27.7*  MCV 96.4  --  97.4  --  96.3  --   --   --   --   --  95.5  PLT 382  --  399  --  374  --   --   --   --   --  347   < > = values in this interval not displayed.   Basic Metabolic Panel: Recent Labs  Lab 10/30/20 2027 10/31/20 0258 11/01/20 0216 11/02/20 0619 11/03/20 0940  NA 137 138 137 139 138  K 3.6 3.6 3.5 3.2* 3.5  CL 100 100 100 99 99  CO2 23 26 26 30 26   GLUCOSE 288* 246* 192* 147* 261*  BUN 39* 37* 26* 18 15  CREATININE 1.33* 1.25* 1.02*  0.91 0.96  CALCIUM 9.3 9.1 8.7* 8.8* 9.0  MG  --   --  1.3* 1.8 1.8  PHOS  --   --  2.4* 2.8 2.9   GFR: Estimated Creatinine Clearance: 62.3 mL/min (by C-G formula based on SCr of 0.96 mg/dL). Liver Function Tests: Recent Labs  Lab 10/30/20 2027 10/31/20 0258  AST 81* 71*  ALT 50* 47*  ALKPHOS 156* 155*  BILITOT 0.7 0.7  PROT 6.8 6.4*  ALBUMIN 3.2* 3.1*   No results for input(s): LIPASE, AMYLASE in the last 168 hours. No results for input(s): AMMONIA in the last 168 hours. Coagulation Profile: Recent Labs  Lab 10/31/20 0041  INR 2.0*   Cardiac Enzymes: No results for input(s): CKTOTAL, CKMB, CKMBINDEX, TROPONINI in the last 168 hours. BNP (last 3 results) No results for input(s): PROBNP in the last 8760 hours. HbA1C: No results for input(s): HGBA1C in the last 72 hours. CBG: Recent Labs  Lab 11/02/20 2054 11/03/20 0009 11/03/20 0435 11/03/20 0814 11/03/20 1244  GLUCAP 290* 307* 281* 263* 277*   Lipid Profile: No results for input(s): CHOL, HDL, LDLCALC, TRIG, CHOLHDL, LDLDIRECT in the last 72 hours. Thyroid Function Tests: No results for input(s): TSH, T4TOTAL, FREET4, T3FREE, THYROIDAB in the last 72 hours. Anemia Panel: No results for  input(s): VITAMINB12, FOLATE, FERRITIN, TIBC, IRON, RETICCTPCT in the last 72 hours. Sepsis Labs: No results for input(s): PROCALCITON, LATICACIDVEN in the last 168 hours.  Recent Results (from the past 240 hour(s))  Respiratory Panel by RT PCR (Flu A&B, Covid) - Nasopharyngeal Swab     Status: None   Collection Time: 10/30/20 10:52 PM   Specimen: Nasopharyngeal Swab; Nasopharyngeal(NP) swabs in vial transport medium  Result Value Ref Range Status   SARS Coronavirus 2 by RT PCR NEGATIVE NEGATIVE Final    Comment: (NOTE) SARS-CoV-2 target nucleic acids are NOT DETECTED.  The SARS-CoV-2 RNA is generally detectable in upper respiratoy specimens during the acute phase of infection. The lowest concentration of SARS-CoV-2 viral copies this assay can detect is 131 copies/mL. A negative result does not preclude SARS-Cov-2 infection and should not be used as the sole basis for treatment or other patient management decisions. A negative result may occur with  improper specimen collection/handling, submission of specimen other than nasopharyngeal swab, presence of viral mutation(s) within the areas targeted by this assay, and inadequate number of viral copies (<131 copies/mL). A negative result must be combined with clinical observations, patient history, and epidemiological information. The expected result is Negative.  Fact Sheet for Patients:  https://www.moore.com/  Fact Sheet for Healthcare Providers:  https://www.young.biz/  This test is no t yet approved or cleared by the [place] FDA and  has been authorized for detection and/or diagnosis of SARS-CoV-2 by FDA under an Emergency Use Authorization (EUA). This EUA will remain  in effect (meaning this test can be used) for the duration of the COVID-19 declaration under Section 564(b)(1) of the Act, 21 U.S.C. section 360bbb-3(b)(1), unless the authorization is terminated or revoked sooner.      Influenza A by PCR NEGATIVE NEGATIVE Final   Influenza B by PCR NEGATIVE NEGATIVE Final    Comment: (NOTE) The Xpert Xpress SARS-CoV-2/FLU/RSV assay is intended as an aid in  the diagnosis of influenza from Nasopharyngeal swab specimens and  should not be used as a sole basis for treatment. Nasal washings and  aspirates are unacceptable for Xpert Xpress SARS-CoV-2/FLU/RSV  testing.  Fact Sheet for Patients: https://www.moore.com/  Fact Sheet for Healthcare  Providers: https://www.young.biz/  This test is not yet approved or cleared by the [place] and  has been authorized for detection and/or diagnosis of SARS-CoV-2 by  FDA under an Emergency Use Authorization (EUA). This EUA will remain  in effect (meaning this test can be used) for the duration of the  Covid-19 declaration under Section 564(b)(1) of the Act, 21  U.S.C. section 360bbb-3(b)(1), unless the authorization is  terminated or revoked. Performed at [facility], [address] [place], [place] [address]   Surgical pcr screen     Status: Abnormal   Collection Time: 11/01/20  5:02 AM   Specimen: Nasal Mucosa; Nasal Swab  Result Value Ref Range Status   MRSA, PCR NEGATIVE NEGATIVE Final   Staphylococcus aureus POSITIVE (A) NEGATIVE Final    Comment: (NOTE) The Xpert SA Assay (FDA approved for NASAL specimens in patients 59 years of age and older), is one component of a comprehensive surveillance program. It is not intended to diagnose infection nor to guide or monitor treatment. Performed at [facility], [address] [place], [place] [address]     Radiology Studies: No results found. Scheduled Meds: . buPROPion  100 mg Oral Daily  . chlorhexidine  15 mL Mouth Rinse BID  . Chlorhexidine Gluconate Cloth  6 each Topical Q0600  . [START ON 11/04/2020] influenza vaccine adjuvanted  0.5 mL Intramuscular Tomorrow-1000  . insulin aspart  0-9 Units  Subcutaneous Q4H  . levothyroxine  50 mcg Oral QAC breakfast  . mouth rinse  15 mL Mouth Rinse q12n4p  . metoprolol succinate  50 mg Oral Daily  . mupirocin ointment  1 application Nasal BID  . nystatin  5 mL Oral QID  . pantoprazole  40 mg Oral BID AC  . polyethylene glycol-electrolytes  4,000 mL Oral Once  . pravastatin  80 mg Oral Daily  . ramipril  5 mg Oral Daily  . torsemide  20 mg Oral Daily  . vitamin B-12  1,000 mcg Oral Daily   Continuous Infusions: . lactated ringers Stopped (11/01/20 0903)     LOS: 3 days    Time spent: 25 mins.    Cipriano Bunker, MD [facility]   If 7PM-7AM, please contact night-coverage

## 2020-11-03 NOTE — Progress Notes (Signed)
Brief Nutrition Note  RD received request [facility] to speak with pt regarding current diet order. Pt is concerned about food choices on soft diet, secondary to DM.   RD attempted to speak with pt via call to hospital room phone, however, no answer.   RD attached "Low Fiber Nutrition Therapy" handout from [facility] Nutrition Care Manual to AVS/ discharge instructions.   Levada Schilling, RD, LDN, CDCES Registered Dietitian II Certified Diabetes Care and Education Specialist Please refer to [facility] for RD and/or RD on-call/weekend/after hours pager

## 2020-11-03 NOTE — Care Management Important Message (Signed)
Important Message  Patient Details  Name: Kirsten Jensen MRN: [id number] Date of Birth: 1932/02/14   Medicare Important Message Given:  Yes     Mishti Swanton P Daviona Herbert 11/03/2020, 3:08 PM

## 2020-11-03 NOTE — Plan of Care (Signed)
  Problem: Education: Goal: Knowledge of General Education information will improve Description Including pain rating scale, medication(s)/side effects and non-pharmacologic comfort measures Outcome: Progressing   

## 2020-11-03 NOTE — Progress Notes (Signed)
2350 Patient heading down to CT with transport via bed and oxygen tank.

## 2020-11-03 NOTE — Progress Notes (Addendum)
Progress Note  Patient Name: Kirsten Jensen Date of Encounter: 11/03/2020  [facility] Cardiologist: Sherryl Manges, MD   Subjective   Remains on O2. She denies chest pain, but still reports SOB off of O2. No signs of bleeding.  Inpatient Medications    Scheduled Meds: . buPROPion  100 mg Oral Daily  . chlorhexidine  15 mL Mouth Rinse BID  . Chlorhexidine Gluconate Cloth  6 each Topical Q0600  . insulin aspart  0-9 Units Subcutaneous Q4H  . levothyroxine  50 mcg Oral QAC breakfast  . mouth rinse  15 mL Mouth Rinse q12n4p  . metoprolol succinate  50 mg Oral Daily  . mupirocin ointment  1 application Nasal BID  . nystatin  5 mL Oral QID  . pantoprazole  40 mg Oral BID AC  . polyethylene glycol-electrolytes  4,000 mL Oral Once  . pravastatin  80 mg Oral Daily  . ramipril  5 mg Oral Daily  . torsemide  20 mg Oral Daily  . vitamin B-12  1,000 mcg Oral Daily   Continuous Infusions: . lactated ringers Stopped (11/01/20 0903)   PRN Meds: benzonatate, guaiFENesin   Vital Signs    Vitals:   11/02/20 1617 11/02/20 2000 11/03/20 0256 11/03/20 0750  BP: 140/83 133/64 117/63 103/73  Pulse: 90 75 76 76  Resp: 17 18 16 16   Temp: 97.8 F (36.6 C) 98.1 F (36.7 C) 98.3 F (36.8 C) 98.3 F (36.8 C)  TempSrc: Oral Oral Oral Oral  SpO2: 96%  97% 98%  Weight:      Height:        Intake/Output Summary (Last 24 hours) at 11/03/2020 0819 Last data filed at 11/02/2020 2100 Gross per 24 hour  Intake 420 ml  Output 300 ml  Net 120 ml   Last 3 Weights 11/02/2020 11/02/2020 11/01/2020  Weight (lbs) 313 lb 7.9 oz 313 lb 7.9 oz 317 lb 14.5 oz  Weight (kg) 142.2 kg 142.2 kg 144.2 kg      Telemetry    Paced rhythm - Personally Reviewed  ECG    No new tracings - Personally Reviewed  Physical Exam   GEN: obese female in NAD  Neck: No JVD Cardiac: irregular rhythm, regular rate  Respiratory: Clear to auscultation bilaterally. GI: Soft, nontender, non-distended  MS: mild  B LE edema; No deformity. Neuro:  Nonfocal  Psych: Normal affect   Labs    High Sensitivity Troponin:   Recent Labs  Lab 10/30/20 2232 10/31/20 0041  TROPONINIHS 26* 25*      Chemistry Recent Labs  Lab 10/30/20 2027 10/30/20 2027 10/31/20 0258 11/01/20 0216 11/02/20 0619  NA 137   < > 138 137 139  K 3.6   < > 3.6 3.5 3.2*  CL 100   < > 100 100 99  CO2 23   < > 26 26 30   GLUCOSE 288*   < > 246* 192* 147*  BUN 39*   < > 37* 26* 18  CREATININE 1.33*   < > 1.25* 1.02* 0.91  CALCIUM 9.3   < > 9.1 8.7* 8.8*  PROT 6.8  --  6.4*  --   --   ALBUMIN 3.2*  --  3.1*  --   --   AST 81*  --  71*  --   --   ALT 50*  --  47*  --   --   ALKPHOS 156*  --  155*  --   --   BILITOT  0.7  --  0.7  --   --   GFRNONAA 39*   < > 42* 54* >60  ANIONGAP 14   < > 12 11 10    < > = values in this interval not displayed.     Hematology Recent Labs  Lab 10/30/20 1505 10/30/20 1505 10/30/20 2027 10/30/20 2027 10/31/20 0258 10/31/20 1035 11/01/20 0216 11/01/20 1110 11/02/20 0619  WBC 10.8*  --  9.6  --  9.2  --   --   --   --   RBC 2.77*  --  2.74*  --  2.69*  --   --   --   --   HGB 8.0*   < > 8.2*   < > 8.1*   < > 7.8* 8.0* 7.8*  HCT 26.7*   < > 26.7*   < > 25.9*   < > 25.3* 25.9* 24.8*  MCV 96.4  --  97.4  --  96.3  --   --   --   --   MCH 28.9  --  29.9  --  30.1  --   --   --   --   MCHC 30.0  --  30.7  --  31.3  --   --   --   --   RDW 15.1  --  15.2  --  15.3  --   --   --   --   PLT 382  --  399  --  374  --   --   --   --    < > = values in this interval not displayed.    BNP Recent Labs  Lab 10/30/20 1505  BNP 628.9*     DDimer No results for input(s): DDIMER in the last 168 hours.   Radiology    No results found.  Cardiac Studies   Echo 10/31/20: 1. Left ventricular ejection fraction, by estimation, is 55 to 60%. The  left ventricle has normal function. The left ventricle has no regional  wall motion abnormalities. There is mild concentric left ventricular   hypertrophy. Left ventricular diastolic  function could not be evaluated.  2. Right ventricular systolic function is normal. The right ventricular  size is normal. There is moderately elevated pulmonary artery systolic  pressure. The estimated right ventricular systolic pressure is 58.8 mmHg.  3. The mitral valve is degenerative. Mild mitral valve regurgitation.  4. Tricuspid valve regurgitation is mild to moderate.  5. The aortic valve is tricuspid. There is mild calcification of the  aortic valve. There is mild thickening of the aortic valve. Aortic valve  regurgitation is not visualized. Mild aortic valve sclerosis is present,  with no evidence of aortic valve  stenosis.  6. The inferior vena cava is dilated in size with <50% respiratory  variability, suggesting right atrial pressure of 15 mmHg.   Patient Profile     86 y.o. female with a history of tachybradycardia syndrome s/p PPM, CAD, OSA, HTN, DM, and atrial fibrillation whom we were asked to evaluate in the setting of suspected gastrointestinal bleed and recent chest pressure and dyspnea.  Assessment & Plan    Paroxysmal Atrial fibrillation - 120 mg cardizem, 25 mg toprol - home 5 mg eliquis - would resume eliquis when OK from GI standpoint This patients CHA2DS2-VASc Score and unadjusted Ischemic Stroke Rate (% per year) is equal to 11.2 % stroke rate/year from a score of 7 (female, 4age, CAD, CHF, HTN, DM)  CAD s/p PCI ?5 years ago - was on plavix at home - no PCI within the past 12 months - recommend D/C plavix.     Hypertension - medications above + ramipril - BP well-controlled   Hyperlipidemia with LDL goal < 70 - continue 80 mg pravachol - need updated lipid panel - can be completed OP   Chronic diastolic heart failure - continue home torsemide - echo this admission with preserved EF   GI bleed - Hb dropped to 8 from 11 g - EGD and colonoscopy with GI - no signs of bleeding - HB nadir 7.6,  now 7.8 - has not been transfused - will defer to primary and GI regarding next steps +/- iron      For questions or updates, please contact [facility] Please consult [email or web address] for contact info under        Signed, Marcelino Duster, PA  11/03/2020, 8:19 AM    History and all data above reviewed.  Patient examined.  I agree with the findings as above.  The patient exam reveals COR:  Irregular RR  ,  Lungs: Clear  ,  Abd: Positive bowel sounds, no rebound no guarding, Ext  No edema   .  All available labs, radiology testing, previous records reviewed. Agree with documented assessment and plan. Atrial fib:  I would agree that the DOAC needs to be resumed as soon as cleared by GI.  I also agree with discontinuing Plavix given no acute coronary intervention.  I would use DOAC only in this situation with high risk for embolic stroke, no recent ACS and recent GI bleed.     Fayrene Fearing Dayven Linsley  12:09 PM  11/03/2020

## 2020-11-03 NOTE — Progress Notes (Signed)
Contacted on-coming phlebotomist [place] at [phone].  She is coming to get morning labs for patient.

## 2020-11-04 ENCOUNTER — Other Ambulatory Visit (HOSPITAL_COMMUNITY): Payer: Medicare Other

## 2020-11-04 DIAGNOSIS — I482 Chronic atrial fibrillation, unspecified: Secondary | ICD-10-CM | POA: Diagnosis not present

## 2020-11-04 DIAGNOSIS — K922 Gastrointestinal hemorrhage, unspecified: Secondary | ICD-10-CM | POA: Diagnosis not present

## 2020-11-04 LAB — GLUCOSE, CAPILLARY
Glucose-Capillary: 169 mg/dL — ABNORMAL HIGH (ref 70–99)
Glucose-Capillary: 192 mg/dL — ABNORMAL HIGH (ref 70–99)
Glucose-Capillary: 193 mg/dL — ABNORMAL HIGH (ref 70–99)
Glucose-Capillary: 199 mg/dL — ABNORMAL HIGH (ref 70–99)
Glucose-Capillary: 231 mg/dL — ABNORMAL HIGH (ref 70–99)
Glucose-Capillary: 252 mg/dL — ABNORMAL HIGH (ref 70–99)

## 2020-11-04 LAB — BASIC METABOLIC PANEL
Anion gap: 11 (ref 5–15)
BUN: 18 mg/dL (ref 8–23)
CO2: 26 mmol/L (ref 22–32)
Calcium: 8.8 mg/dL — ABNORMAL LOW (ref 8.9–10.3)
Chloride: 98 mmol/L (ref 98–111)
Creatinine, Ser: 1.1 mg/dL — ABNORMAL HIGH (ref 0.44–1.00)
GFR, Estimated: 49 mL/min — ABNORMAL LOW (ref >60)
Glucose, Bld: 184 mg/dL — ABNORMAL HIGH (ref 70–99)
Potassium: 3.3 mmol/L — ABNORMAL LOW (ref 3.5–5.1)
Sodium: 135 mmol/L (ref 135–145)

## 2020-11-04 LAB — HEMOGLOBIN AND HEMATOCRIT, BLOOD
HCT: 25.9 % — ABNORMAL LOW (ref 36.0–46.0)
Hemoglobin: 8 g/dL — ABNORMAL LOW (ref 12.0–15.0)

## 2020-11-04 LAB — TYPE AND SCREEN
ABO/RH(D): B NEG
Antibody Screen: NEGATIVE
Unit division: 0

## 2020-11-04 LAB — CBC
HCT: 26.4 % — ABNORMAL LOW (ref 36.0–46.0)
Hemoglobin: 8.2 g/dL — ABNORMAL LOW (ref 12.0–15.0)
MCH: 28.8 pg (ref 26.0–34.0)
MCHC: 31.1 g/dL (ref 30.0–36.0)
MCV: 92.6 fL (ref 80.0–100.0)
Platelets: 278 10*3/uL (ref 150–400)
RBC: 2.85 MIL/uL — ABNORMAL LOW (ref 3.87–5.11)
RDW: 16 % — ABNORMAL HIGH (ref 11.5–15.5)
WBC: 8 10*3/uL (ref 4.0–10.5)
nRBC: 0 % (ref 0.0–0.2)

## 2020-11-04 LAB — BPAM RBC
Blood Product Expiration Date: 202111282359
ISSUE DATE / TIME: 202111221512
Unit Type and Rh: 9500

## 2020-11-04 LAB — PHOSPHORUS: Phosphorus: 3.3 mg/dL (ref 2.5–4.6)

## 2020-11-04 LAB — MAGNESIUM: Magnesium: 1.7 mg/dL (ref 1.7–2.4)

## 2020-11-04 MED ORDER — INSULIN DETEMIR 100 UNIT/ML ~~LOC~~ SOLN
10.0000 [IU] | Freq: Every day | SUBCUTANEOUS | Status: DC
  Administered 2020-11-04 – 2020-11-08 (×5): 10 [IU] via SUBCUTANEOUS
  Filled 2020-11-04 (×5): qty 0.1

## 2020-11-04 MED ORDER — APIXABAN 5 MG PO TABS
5.0000 mg | ORAL_TABLET | Freq: Two times a day (BID) | ORAL | Status: DC
  Administered 2020-11-04 – 2020-11-08 (×9): 5 mg via ORAL
  Filled 2020-11-04 (×9): qty 1

## 2020-11-04 MED ORDER — POTASSIUM CHLORIDE 20 MEQ PO PACK
40.0000 meq | PACK | Freq: Once | ORAL | Status: AC
  Administered 2020-11-04: 40 meq via ORAL
  Filled 2020-11-04: qty 2

## 2020-11-04 NOTE — Progress Notes (Signed)
Patient back from CT.

## 2020-11-04 NOTE — Evaluation (Signed)
Physical Therapy Evaluation Patient Details Name: Kirsten Jensen MRN: [id number] DOB: Oct 22, 1932 Today's Date: 11/04/2020   History of Present Illness  Patient is an 86 y/o female with PMG of permanent Afib on Eliquis, tachybradycardia syndrome s/p pacemaker placement, atrial flutter, CAD s/p stent x 2, OSA, HTN, dyslipidemia, Type II DM. Patient presented to ED for drop in hemoglobin. Patient was found to have iron deficiency anemia due to chronic GI blood loss. Patient s/p endoscopy 11/20 and colonoscopy 11/21.  Clinical Impression  PTA, patient reports she lived in an independent living facility [facility]) and used rollator for mobility, however has been experiencing increased SOB with exertion for a couple of months. Patient presents with decreased activity tolerance with spO2 maintaining at 96% with activity. Patient required min guard for safety for sit to stand and stand pivot transfer with RW. Patient also presents with generalized weakness and impaired balance. Patient will benefit from skilled PT services to address listed deficits. Recommend HHPT following discharge to maximize functional mobility and endurance.     Follow Up Recommendations Home health PT;Supervision for mobility/OOB    Equipment Recommendations  Wheelchair (measurements PT);Wheelchair cushion (measurements PT);3in1 (PT)    Recommendations for Other Services       Precautions / Restrictions Precautions Precautions: Fall Precaution Comments: legally blind Restrictions Weight Bearing Restrictions: No      Mobility  Bed Mobility               General bed mobility comments: in recliner upon arrival    Transfers Overall transfer level: Needs assistance Equipment used: Rolling Doreen Garretson (2 wheeled) Transfers: Sit to/from [employer] Sit to Stand: Min guard Stand pivot transfers: Min guard       General transfer comment: min guard for safety  Ambulation/Gait                 Stairs            Wheelchair Mobility    Modified Rankin (Stroke Patients Only)       Balance Overall balance assessment: Needs assistance Sitting-balance support: No upper extremity supported;Feet supported Sitting balance-Leahy Scale: Fair     Standing balance support: Bilateral upper extremity supported;During functional activity Standing balance-Leahy Scale: Poor                               Pertinent Vitals/Pain Pain Assessment: No/denies pain    Home Living Family/patient expects to be discharged to:: Assisted living               Home Equipment: Zayvier Caravello - 4 wheels Additional Comments: lives at [facility]; states it is independent living. Patient lives on 2nd floor with elevator     Prior Function Level of Independence: Independent with assistive device(s)         Comments: states [facility] was supposed to do medications but she ended up doing t hem     Hand Dominance        Extremity/Trunk Assessment   Upper Extremity Assessment Upper Extremity Assessment: Generalized weakness    Lower Extremity Assessment Lower Extremity Assessment: Generalized weakness    Cervical / Trunk Assessment Cervical / Trunk Assessment: Kyphotic  Communication   Communication: No difficulties  Cognition Arousal/Alertness: Awake/alert Behavior During Therapy: Agitated Overall Cognitive Status: Within Functional Limits for tasks assessed  General Comments: patient agitated upon arrival and request not to be seen. Son present at beginning of session and encouraged patient to participate. Patient willing to participate but only to [facility].       General Comments General comments (skin integrity, edema, etc.): spO2 resting 95% on RA. spO2 following activity 96% on RA with SOB noted     Exercises     Assessment/Plan    PT Assessment Patient needs continued PT services  PT Problem List  Decreased strength;Decreased activity tolerance;Decreased balance;Decreased mobility;Cardiopulmonary status limiting activity       PT Treatment Interventions DME instruction;Gait training;Functional mobility training;Therapeutic activities;Therapeutic exercise;Balance training;Patient/family education;Wheelchair mobility training    PT Goals (Current goals can be found in the Care Plan section)  Acute Rehab PT Goals Patient Stated Goal: to go home PT Goal Formulation: With patient Time For Goal Achievement: 11/18/20 Potential to Achieve Goals: Good    Frequency Min 3X/week   Barriers to discharge        Co-evaluation               AM-PAC PT "6 Clicks" Mobility  Outcome Measure Help needed turning from your back to your side while in a flat bed without using bedrails?: A Little Help needed moving from lying on your back to sitting on the side of a flat bed without using bedrails?: A Little Help needed moving to and from a bed to a chair (including a wheelchair)?: A Little Help needed standing up from a chair using your arms (e.g., wheelchair or bedside chair)?: A Little Help needed to walk in hospital room?: A Little Help needed climbing 3-5 steps with a railing? : A Lot 6 Click Score: 17    End of Session Equipment Utilized During Treatment: Gait belt Activity Tolerance: Patient limited by fatigue Patient left: in chair;with call bell/phone within reach;with nursing/sitter in room Nurse Communication: Mobility status PT Visit Diagnosis: Unsteadiness on feet (R26.81);Other abnormalities of gait and mobility (R26.89);Muscle weakness (generalized) (M62.81)    Time: 8676-7209 PT Time Calculation (min) (ACUTE ONLY): 41 min   Charges:   PT Evaluation $PT Eval Low Complexity: 1 Low PT Treatments $Therapeutic Activity: 23-37 mins        Gregor Hams, PT, DPT Acute Rehabilitation Services Pager [phone] Office [phone]   Zannie Kehr Allred 11/04/2020,  10:37 AM

## 2020-11-04 NOTE — Progress Notes (Signed)
PROGRESS NOTE    Kirsten Jensen  ZOX:[id number] DOB: 04-06-1932 DOA: 10/30/2020 PCP: Melida Quitter, MD   Brief Narrative:  This 86 years old female with medical history significant for permanent A. fib on Eliquis, tachybradycardia syndrome status post pacemaker, essential hypertension, type 2 diabetes, obstructive sleep apnea on CPAP presented in the ED after she was sent by her provider at A. fib clinic due to abnormal labs.  Her hemoglobin dropped from 11 to 8 g.  She also had a positive FOBT in the ED.   Patient was admitted for possible upper GI bleed,  started on Protonix drip.  She denies any prior history of GI bleed.  Patient also reports 14 pound weight gain associated with chest discomfort. Eliquis was kept on hold.  Cardiology consulted recommended to continue metoprolol and proceed with EGD and colonoscopy.  Gastroenterology consulted,  Patient underwent EGD found to have nonbleeding duodenal ulcer.  Patient underwent colonoscopy found to have diverticulosis in sigmoid colon and 2 small polyps in the transverse and descending colon otherwise unremarkable. GI recommended CT abdomen and pelvis to rule out other lesions which was unremarkable. GI signed off,  Hb stays at 8.0, 1 unit PRBC given. Eliquis resumed.  Anticipated discharge home 11/ 24 if Hb remains stable.  Assessment & Plan:   Active Problems:   Upper GI bleed   Chronic atrial fibrillation (HCC)   Presumed upper GI bleed:  Hemoglobin dropped from 11 g to 8 g. Positive FOBT in the ED She was started on Protonix drip Hold Eliquis, per cardiology Repeat H&H every 6 hours.  Hemoglobin remained stable @ 8.0 Iron profile showed iron deficiency. Transfuse hemoglobin less than 7.0 or less than 8 if symptomatic. GI consulted.  Patient underwent EGD 11/20. She was found to have esophageal plaques consistent with candidiasis, nonbleeding duodenal ulcer. Continue Protonix 40 mg twice daily Patient underwent colonoscopy found to  have external and internal hemorrhoids, diverticulosis in sigmoid colon and 2 small polyps in the transverse and descending colon otherwise unremarkable. GI recommended CT abdomen and pelvis to rule out other lesions which was unremarkable. Hemoglobin dropped to 7.8,  S/p 1 PRBC.  Follow posttransfusion hemoglobin. 8.2 GI signed off, Eliquis resumed. Anticipated discharge home 11/ 24 if hemoglobin remains stable.  Permanent A. fib on Eliquis Eliquis is currently on hold due to presumed upper GI bleed. Recommend cardiology and GI evaluation for guidance regarding future anticoagulation Patient has a CHA2DS2-VASc score of 6 Continue metoprolol for heart rate control Continue to monitor on telemetry Resume Eliquis when cleared from GI. Eliquis resumed 11/23  Acute on chronic diastolic CHF Presented with elevated BNP greater than 600 and evidence of volume overload on exam She is on torsemide at home 20 mg daily. Received a dose of IV Lasix in the ED Held torsemide because of low blood pressure. Start strict I's and O's and daily weight Echocardiogram unremarkable EF 55 to 60%. Resumed torsemide 20 mg daily.  AKI on CKD 3A >> stable.  Baseline creatinine appears to be 1.0 with GFR of 56 Presented with creatinine of 1.3 with GFR of 39 Avoid nephrotoxins Monitor urine output  Elevated troponin suspect demand ischemia in the setting of anemia Trend troponin 26, 25, 26  Coronary artery disease status post PCI x2 Resume Plavix if okay with GI. Continue home Pravachol. Continue metoprolol.  Essential hypertension  Continue metoprolol and lisinopril. Closely monitor vital signs  Hypothyroidism TSH normal Continue home levothyroxine  Type 2 diabetes with hyperglycemia Hold  off oral hypoglycemics Hb A1c 7.9  insulin sliding scale  Severe morbid obesity BMI 49 Management per PCP  OSA CPAP at night   DVT prophylaxis: SCDs Code Status: Full Family  Communication:  No family at bed side. Disposition Plan:  Status is: Inpatient  Remains inpatient appropriate because:Inpatient level of care appropriate due to severity of illness   Dispo:  Patient From: Home  Planned Disposition: Home  Expected discharge date:  11/05/2020 Medically stable for discharge: No    Consultants:   Gastroenterology  Cardiology  Procedures: EGD and colonoscopy Antimicrobials:  Anti-infectives (From admission, onward)   None      Subjective: Patient was seen and examined at bedside.  Overnight events noted.   Patient reports feeling better, denies any abdominal pain, nausea vomiting diarrhea. She wants to have regular diet.   Objective: Vitals:   11/03/20 1535 11/03/20 2007 11/04/20 0400 11/04/20 0745  BP: 118/81 (!) 103/52 (!) 108/56 114/63  Pulse: 82 72 66 80  Resp: 20 17 18 14   Temp: 98.2 F (36.8 C) (!) 97.4 F (36.3 C) (!) 97.4 F (36.3 C) 97.7 F (36.5 C)  TempSrc: Oral Oral Oral Oral  SpO2: 100% 100% 98% 95%  Weight:      Height:        Intake/Output Summary (Last 24 hours) at 11/04/2020 1617 Last data filed at 11/04/2020 0900 Gross per 24 hour  Intake 120 ml  Output --  Net 120 ml   Filed Weights   11/01/20 0759 11/02/20 0359 11/02/20 1411  Weight: (!) 144.2 kg (!) 142.2 kg (!) 142.2 kg    Examination:  General exam: Appears calm and comfortable , not in any distress. Respiratory system: Clear to auscultation. Respiratory effort normal. Cardiovascular system: S1 & S2 heard, RRR. No JVD, murmurs, rubs, gallops or clicks. No pedal edema. Gastrointestinal system: Abdomen is nondistended, soft and nontender. No organomegaly or masses felt.  Normal bowel sounds heard. Central nervous system: Alert and oriented. No focal neurological deficits. Extremities: No edema, no cyanosis, no clubbing. Skin: No rashes, lesions or ulcers Psychiatry: Judgement and insight appear normal. Mood & affect appropriate.     Data  Reviewed: I have personally reviewed following labs and imaging studies  CBC: Recent Labs  Lab 10/30/20 1505 10/30/20 1505 10/30/20 2027 10/30/20 2027 10/31/20 0258 10/31/20 1035 11/02/20 0619 11/03/20 0940 11/03/20 2057 11/04/20 0051 11/04/20 0635  WBC 10.8*  --  9.6  --  9.2  --   --  9.1  --  8.0  --   NEUTROABS  --   --   --   --  7.2  --   --   --   --   --   --   HGB 8.0*   < > 8.2*   < > 8.1*   < > 7.8* 8.4* 8.2* 8.2* 8.0*  HCT 26.7*   < > 26.7*   < > 25.9*   < > 24.8* 27.7* 26.3* 26.4* 25.9*  MCV 96.4  --  97.4  --  96.3  --   --  95.5  --  92.6  --   PLT 382  --  399  --  374  --   --  347  --  278  --    < > = values in this interval not displayed.   Basic Metabolic Panel: Recent Labs  Lab 10/31/20 0258 11/01/20 0216 11/02/20 0619 11/03/20 0940 11/04/20 0051  NA 138 137 139 138 135  K 3.6 3.5 3.2* 3.5 3.3*  CL 100 100 99 99 98  CO2 26 26 30 26 26   GLUCOSE 246* 192* 147* 261* 184*  BUN 37* 26* 18 15 18   CREATININE 1.25* 1.02* 0.91 0.96 1.10*  CALCIUM 9.1 8.7* 8.8* 9.0 8.8*  MG  --  1.3* 1.8 1.8 1.7  PHOS  --  2.4* 2.8 2.9 3.3   GFR: Estimated Creatinine Clearance: 54.4 mL/min (A) (by C-G formula based on SCr of 1.1 mg/dL (H)). Liver Function Tests: Recent Labs  Lab 10/30/20 2027 10/31/20 0258  AST 81* 71*  ALT 50* 47*  ALKPHOS 156* 155*  BILITOT 0.7 0.7  PROT 6.8 6.4*  ALBUMIN 3.2* 3.1*   No results for input(s): LIPASE, AMYLASE in the last 168 hours. No results for input(s): AMMONIA in the last 168 hours. Coagulation Profile: Recent Labs  Lab 10/31/20 0041  INR 2.0*   Cardiac Enzymes: No results for input(s): CKTOTAL, CKMB, CKMBINDEX, TROPONINI in the last 168 hours. BNP (last 3 results) No results for input(s): PROBNP in the last 8760 hours. HbA1C: No results for input(s): HGBA1C in the last 72 hours. CBG: Recent Labs  Lab 11/03/20 2117 11/04/20 0024 11/04/20 0438 11/04/20 0859 11/04/20 1125  GLUCAP 189* 169* 231* 192* 252*    Lipid Profile: No results for input(s): CHOL, HDL, LDLCALC, TRIG, CHOLHDL, LDLDIRECT in the last 72 hours. Thyroid Function Tests: No results for input(s): TSH, T4TOTAL, FREET4, T3FREE, THYROIDAB in the last 72 hours. Anemia Panel: No results for input(s): VITAMINB12, FOLATE, FERRITIN, TIBC, IRON, RETICCTPCT in the last 72 hours. Sepsis Labs: No results for input(s): PROCALCITON, LATICACIDVEN in the last 168 hours.  Recent Results (from the past 240 hour(s))  Respiratory Panel by RT PCR (Flu A&B, Covid) - Nasopharyngeal Swab     Status: None   Collection Time: 10/30/20 10:52 PM   Specimen: Nasopharyngeal Swab; Nasopharyngeal(NP) swabs in vial transport medium  Result Value Ref Range Status   SARS Coronavirus 2 by RT PCR NEGATIVE NEGATIVE Final    Comment: (NOTE) SARS-CoV-2 target nucleic acids are NOT DETECTED.  The SARS-CoV-2 RNA is generally detectable in upper respiratoy specimens during the acute phase of infection. The lowest concentration of SARS-CoV-2 viral copies this assay can detect is 131 copies/mL. A negative result does not preclude SARS-Cov-2 infection and should not be used as the sole basis for treatment or other patient management decisions. A negative result may occur with  improper specimen collection/handling, submission of specimen other than nasopharyngeal swab, presence of viral mutation(s) within the areas targeted by this assay, and inadequate number of viral copies (<131 copies/mL). A negative result must be combined with clinical observations, patient history, and epidemiological information. The expected result is Negative.  Fact Sheet for Patients:  11/06/20  Fact Sheet for Healthcare Providers:  11/01/20  This test is no t yet approved or cleared by the https://www.moore.com/ FDA and  has been authorized for detection and/or diagnosis of SARS-CoV-2 by FDA under an Emergency Use  Authorization (EUA). This EUA will remain  in effect (meaning this test can be used) for the duration of the COVID-19 declaration under Section 564(b)(1) of the Act, 21 U.S.C. section 360bbb-3(b)(1), unless the authorization is terminated or revoked sooner.     Influenza A by PCR NEGATIVE NEGATIVE Final   Influenza B by PCR NEGATIVE NEGATIVE Final    Comment: (NOTE) The Xpert Xpress SARS-CoV-2/FLU/RSV assay is intended as an aid in  the diagnosis of influenza from Nasopharyngeal swab  specimens and  should not be used as a sole basis for treatment. Nasal washings and  aspirates are unacceptable for Xpert Xpress SARS-CoV-2/FLU/RSV  testing.  Fact Sheet for Patients: https://www.moore.com/https://www.fda.gov/media/142436/download  Fact Sheet for Healthcare Providers: https://www.young.biz/https://www.fda.gov/media/142435/download  This test is not yet approved or cleared by the [place] FDA and  has been authorized for detection and/or diagnosis of SARS-CoV-2 by  FDA under an Emergency Use Authorization (EUA). This EUA will remain  in effect (meaning this test can be used) for the duration of the  Covid-19 declaration under Section 564(b)(1) of the Act, 21  U.S.C. section 360bbb-3(b)(1), unless the authorization is  terminated or revoked. Performed at [facility], [address] [place], [place] [address]   Surgical pcr screen     Status: Abnormal   Collection Time: 11/01/20  5:02 AM   Specimen: Nasal Mucosa; Nasal Swab  Result Value Ref Range Status   MRSA, PCR NEGATIVE NEGATIVE Final   Staphylococcus aureus POSITIVE (A) NEGATIVE Final    Comment: (NOTE) The Xpert SA Assay (FDA approved for NASAL specimens in patients 84 years of age and older), is one component of a comprehensive surveillance program. It is not intended to diagnose infection nor to guide or monitor treatment. Performed at [facility], [address] [place], [place] [address]     Radiology Studies: CT ABDOMEN PELVIS W  CONTRAST  Result Date: 11/04/2020 CLINICAL DATA:  Anemia. Possible upper GI bleed. 14 pound weight gain and chest discomfort. Candidiasis and duodenal ulcers demonstrated at endoscopy. EXAM: CT ABDOMEN AND PELVIS WITH CONTRAST TECHNIQUE: Multidetector CT imaging of the abdomen and pelvis was performed using the standard protocol following bolus administration of intravenous contrast. CONTRAST:  100mL OMNIPAQUE IOHEXOL 300 MG/ML  SOLN COMPARISON:  None. FINDINGS: Lower chest: Small bilateral pleural effusions with basilar atelectasis. Hepatobiliary: No focal liver abnormality is seen. No gallstones, gallbladder wall thickening, or biliary dilatation. Pancreas: Diffuse fatty atrophy of the pancreas. No inflammatory change or focal mass identified. Spleen: Normal in size without focal abnormality. Adrenals/Urinary Tract: Adrenal glands are unremarkable. Kidneys are normal, without renal calculi, focal lesion, or hydronephrosis. Bladder is unremarkable. Stomach/Bowel: The stomach, small bowel, and colon are not abnormally distended. No wall thickening or inflammatory changes are identified. Diverticulosis of the sigmoid colon. No evidence of diverticulitis. Appendix is not identified. Vascular/Lymphatic: Aortic atherosclerosis. No enlarged abdominal or pelvic lymph nodes. Reproductive: Status post hysterectomy. No adnexal masses. Other: No free air or free fluid in the abdomen. Abdominal wall musculature appears intact. Musculoskeletal: Degenerative changes in the spine. No destructive bone lesions. IMPRESSION: 1. No acute process demonstrated in the abdomen or pelvis. No evidence of bowel obstruction or inflammation. 2. Small bilateral pleural effusions with basilar atelectasis. 3. Diffuse fatty atrophy of the pancreas. 4. Aortic atherosclerosis. Aortic Atherosclerosis (ICD10-I70.0). Electronically Signed   By: Burman NievesWilliam  Stevens M.D.   On: 11/04/2020 01:12   Scheduled Meds: . apixaban  5 mg Oral BID  . buPROPion   100 mg Oral Daily  . chlorhexidine  15 mL Mouth Rinse BID  . Chlorhexidine Gluconate Cloth  6 each Topical Q0600  . influenza vaccine adjuvanted  0.5 mL Intramuscular Tomorrow-1000  . insulin aspart  0-9 Units Subcutaneous Q4H  . insulin detemir  10 Units Subcutaneous Daily  . levothyroxine  50 mcg Oral QAC breakfast  . mouth rinse  15 mL Mouth Rinse q12n4p  . metoprolol succinate  50 mg Oral Daily  . mupirocin ointment  1  application Nasal BID  . nystatin  5 mL Oral QID  . pantoprazole  40 mg Oral BID AC  . polyethylene glycol-electrolytes  4,000 mL Oral Once  . pravastatin  80 mg Oral Daily  . ramipril  5 mg Oral Daily  . torsemide  20 mg Oral Daily  . vitamin B-12  1,000 mcg Oral Daily   Continuous Infusions: . lactated ringers Stopped (11/01/20 0903)     LOS: 4 days    Time spent: 25 mins.    Cipriano Bunker, MD [facility]   If 7PM-7AM, please contact night-coverage

## 2020-11-04 NOTE — Progress Notes (Signed)
Pt has refused cpap at this time.  Pt states she does not want to use cpap during her stay here, that it is "different from her home unit' so RT removed device.  RT explained she can request it at anytime and her circuit was left in room.  RT will continue to monitor.

## 2020-11-04 NOTE — Progress Notes (Signed)
[facility] Gastroenterology Progress Note  Kirsten Jensen 86 y.o. 1932-07-24  CC:  Iron deficiency anemia  Subjective: Patient denies any complaints. Denies abdominal pain, nausea/vomiting, melena, or hematochezia. Denies chest pain, states shortness of breath is improving.  ROS : Review of Systems  Respiratory: Positive for shortness of breath. Negative for cough.   Cardiovascular: Negative for chest pain.  Gastrointestinal: Negative for abdominal pain, blood in stool, constipation, diarrhea, heartburn, melena, nausea and vomiting.   Objective: Vital signs in last 24 hours: Vitals:   11/04/20 0400 11/04/20 0745  BP: (!) 108/56 114/63  Pulse: 66 80  Resp: 18 14  Temp: (!) 97.4 F (36.3 C) 97.7 F (36.5 C)  SpO2: 98% 95%    Physical Exam:  General:  Alert, cooperative, no distress, obese  Head:  Normocephalic, without obvious abnormality, atraumatic  Eyes:  Anicteric sclera, EOMs intact   Lungs:   Clear to auscultation bilaterally, respirations unlabored  Heart:  Regular rate, irregular rhythm  Abdomen:   Soft, non-tender,non-distended, bowel sounds active all four quadrants, no guarding or peritoneal signs  Extremities: Mild bilateral lower extremity edema  Pulses: 2+ and symmetric    Lab Results: Recent Labs    11/03/20 0940 11/04/20 0051  NA 138 135  K 3.5 3.3*  CL 99 98  CO2 26 26  GLUCOSE 261* 184*  BUN 15 18  CREATININE 0.96 1.10*  CALCIUM 9.0 8.8*  MG 1.8 1.7  PHOS 2.9 3.3   No results for input(s): AST, ALT, ALKPHOS, BILITOT, PROT, ALBUMIN in the last 72 hours. Recent Labs    11/03/20 0940 11/03/20 2057 11/04/20 0051 11/04/20 0635  WBC 9.1  --  8.0  --   HGB 8.4*   < > 8.2* 8.0*  HCT 27.7*   < > 26.4* 25.9*  MCV 95.5  --  92.6  --   PLT 347  --  278  --    < > = values in this interval not displayed.   No results for input(s): LABPROT, INR in the last 72 hours.    Assessment: Anemia with heme positive stools in the setting of Eliquis and Plavix  use.  No frank GI bleeding. -Hemoglobin 8.0 today, 8.2 yesterday.  Unchanged after 1u pRBCs, likely due to dilution. -EGD and colonoscopy unrevealing for source of anemia.  (EGD: Non-bleeding duodenal ulcer with no stigmata of bleeding, small HH, esophageal candidiasis; colonoscopy: 2 small polyps, diverticulosis, internal hemorrhoids) -No abnormalities in the abdomen/small bowel per CT yesterday  A. fib, on Eliquis, was also on Plavix at home. Per cardiology note, plan to d/c Plavix.  Acute on chronic CHF, CAD s/p stenting, pacemaker  CKD  Plan: Protonix 40 mg PO BID x 6-8 weeks, then once daily indefinitely.  Resume anticoagulation on discharge as per cardiology recommendations.  Continue to monitor H&H with transfusion as needed to maintain hemoglobin greater than 7.  Repeat Hgb with PCP in 1-2 weeks.  Recommend follow-up in our office in the next 2 to 4 weeks.  [facility] will sign off.  Please contact us if we can be of any further assistance during his hospital stay.  Edrick Kins PA-C 11/04/2020, 10:48 AM  Contact #  [phone]

## 2020-11-04 NOTE — Progress Notes (Addendum)
Progress Note  Patient Name: Kirsten Jensen Date of Encounter: 11/04/2020  [facility] Cardiologist: Sherryl Manges, MD   Subjective   1U PRBC yesterday, unfortunately, IV infiltrated. Per nursing, pt did receive about 250 cc / 300 total. Hb unchanged, but could be dilutional. Pt does appear volume up today. PIV access is limited.   K 3.3, replaced  Inpatient Medications    Scheduled Meds: . buPROPion  100 mg Oral Daily  . chlorhexidine  15 mL Mouth Rinse BID  . Chlorhexidine Gluconate Cloth  6 each Topical Q0600  . influenza vaccine adjuvanted  0.5 mL Intramuscular Tomorrow-1000  . insulin aspart  0-9 Units Subcutaneous Q4H  . levothyroxine  50 mcg Oral QAC breakfast  . mouth rinse  15 mL Mouth Rinse q12n4p  . metoprolol succinate  50 mg Oral Daily  . mupirocin ointment  1 application Nasal BID  . nystatin  5 mL Oral QID  . pantoprazole  40 mg Oral BID AC  . polyethylene glycol-electrolytes  4,000 mL Oral Once  . potassium chloride  40 mEq Oral Once  . pravastatin  80 mg Oral Daily  . ramipril  5 mg Oral Daily  . torsemide  20 mg Oral Daily  . vitamin B-12  1,000 mcg Oral Daily   Continuous Infusions: . lactated ringers Stopped (11/01/20 0903)   PRN Meds: benzonatate, guaiFENesin   Vital Signs    Vitals:   11/03/20 1535 11/03/20 2007 11/04/20 0400 11/04/20 0745  BP: 118/81 (!) 103/52 (!) 108/56 114/63  Pulse: 82 72 66 80  Resp: 20 17 18 14   Temp: 98.2 F (36.8 C) (!) 97.4 F (36.3 C) (!) 97.4 F (36.3 C)   TempSrc: Oral Oral Oral   SpO2: 100% 100% 98% 95%  Weight:      Height:        Intake/Output Summary (Last 24 hours) at 11/04/2020 11/06/2020 Last data filed at 11/03/2020 1535 Gross per 24 hour  Intake 325 ml  Output --  Net 325 ml   Last 3 Weights 11/02/2020 11/02/2020 11/01/2020  Weight (lbs) 313 lb 7.9 oz 313 lb 7.9 oz 317 lb 14.5 oz  Weight (kg) 142.2 kg 142.2 kg 144.2 kg      Telemetry    Paced rhythm, PVCs - Personally Reviewed  ECG     No new tracings - Personally Reviewed  Physical Exam   GEN: obese female, appears younger than stated age, in no acute distress.   Neck: No JVD -exam difficult Cardiac: irregular rhythm, regular rate Respiratory: unlabored, diminished in bases. GI: Soft, nontender, non-distended  MS: 1+ B LE edema; No deformity. Neuro:  Nonfocal  Psych: Normal affect   Labs    High Sensitivity Troponin:   Recent Labs  Lab 10/30/20 2232 10/31/20 0041  TROPONINIHS 26* 25*      Chemistry Recent Labs  Lab 10/30/20 2027 10/30/20 2027 10/31/20 0258 11/01/20 0216 11/02/20 0619 11/03/20 0940 11/04/20 0051  NA 137   < > 138   < > 139 138 135  K 3.6   < > 3.6   < > 3.2* 3.5 3.3*  CL 100   < > 100   < > 99 99 98  CO2 23   < > 26   < > 30 26 26   GLUCOSE 288*   < > 246*   < > 147* 261* 184*  BUN 39*   < > 37*   < > 18 15 18   CREATININE 1.33*   < >  1.25*   < > 0.91 0.96 1.10*  CALCIUM 9.3   < > 9.1   < > 8.8* 9.0 8.8*  PROT 6.8  --  6.4*  --   --   --   --   ALBUMIN 3.2*  --  3.1*  --   --   --   --   AST 81*  --  71*  --   --   --   --   ALT 50*  --  47*  --   --   --   --   ALKPHOS 156*  --  155*  --   --   --   --   BILITOT 0.7  --  0.7  --   --   --   --   GFRNONAA 39*   < > 42*   < > >60 58* 49*  ANIONGAP 14   < > 12   < > 10 13 11    < > = values in this interval not displayed.     Hematology Recent Labs  Lab 10/31/20 0258 10/31/20 1035 11/03/20 0940 11/03/20 0940 11/03/20 2057 11/04/20 0051 11/04/20 0635  WBC 9.2  --  9.1  --   --  8.0  --   RBC 2.69*  --  2.90*  --   --  2.85*  --   HGB 8.1*   < > 8.4*   < > 8.2* 8.2* 8.0*  HCT 25.9*   < > 27.7*   < > 26.3* 26.4* 25.9*  MCV 96.3  --  95.5  --   --  92.6  --   MCH 30.1  --  29.0  --   --  28.8  --   MCHC 31.3  --  30.3  --   --  31.1  --   RDW 15.3  --  15.0  --   --  16.0*  --   PLT 374  --  347  --   --  278  --    < > = values in this interval not displayed.    BNP Recent Labs  Lab 10/30/20 1505  BNP 628.9*      DDimer No results for input(s): DDIMER in the last 168 hours.   Radiology    CT ABDOMEN PELVIS W CONTRAST  Result Date: 11/04/2020 CLINICAL DATA:  Anemia. Possible upper GI bleed. 14 pound weight gain and chest discomfort. Candidiasis and duodenal ulcers demonstrated at endoscopy. EXAM: CT ABDOMEN AND PELVIS WITH CONTRAST TECHNIQUE: Multidetector CT imaging of the abdomen and pelvis was performed using the standard protocol following bolus administration of intravenous contrast. CONTRAST:  11/06/2020 OMNIPAQUE IOHEXOL 300 MG/ML  SOLN COMPARISON:  None. FINDINGS: Lower chest: Small bilateral pleural effusions with basilar atelectasis. Hepatobiliary: No focal liver abnormality is seen. No gallstones, gallbladder wall thickening, or biliary dilatation. Pancreas: Diffuse fatty atrophy of the pancreas. No inflammatory change or focal mass identified. Spleen: Normal in size without focal abnormality. Adrenals/Urinary Tract: Adrenal glands are unremarkable. Kidneys are normal, without renal calculi, focal lesion, or hydronephrosis. Bladder is unremarkable. Stomach/Bowel: The stomach, small bowel, and colon are not abnormally distended. No wall thickening or inflammatory changes are identified. Diverticulosis of the sigmoid colon. No evidence of diverticulitis. Appendix is not identified. Vascular/Lymphatic: Aortic atherosclerosis. No enlarged abdominal or pelvic lymph nodes. Reproductive: Status post hysterectomy. No adnexal masses. Other: No free air or free fluid in the abdomen. Abdominal wall musculature appears intact. Musculoskeletal: Degenerative changes in  the spine. No destructive bone lesions. IMPRESSION: 1. No acute process demonstrated in the abdomen or pelvis. No evidence of bowel obstruction or inflammation. 2. Small bilateral pleural effusions with basilar atelectasis. 3. Diffuse fatty atrophy of the pancreas. 4. Aortic atherosclerosis. Aortic Atherosclerosis (ICD10-I70.0). Electronically Signed    By: Burman Nieves M.D.   On: 11/04/2020 01:12    Cardiac Studies   Echo 10/31/20: 1. Left ventricular ejection fraction, by estimation, is 55 to 60%. The  left ventricle has normal function. The left ventricle has no regional  wall motion abnormalities. There is mild concentric left ventricular  hypertrophy. Left ventricular diastolic  function could not be evaluated.  2. Right ventricular systolic function is normal. The right ventricular  size is normal. There is moderately elevated pulmonary artery systolic  pressure. The estimated right ventricular systolic pressure is 58.8 mmHg.  3. The mitral valve is degenerative. Mild mitral valve regurgitation.  4. Tricuspid valve regurgitation is mild to moderate.  5. The aortic valve is tricuspid. There is mild calcification of the  aortic valve. There is mild thickening of the aortic valve. Aortic valve  regurgitation is not visualized. Mild aortic valve sclerosis is present,  with no evidence of aortic valve  stenosis.  6. The inferior vena cava is dilated in size with <50% respiratory  variability, suggesting right atrial pressure of 15 mmHg.   Patient Profile     86 y.o. female with a history of tachybradycardia syndrome s/p PPM, CAD, OSA, HTN, DM, and atrial fibrillationwhom we were asked to evaluate in the setting of suspected gastrointestinal bleed and recent chest pressure and dyspnea.  Assessment & Plan    Persistent atrial fibrillation Chronic anticoagulation - 120 mg cardizem, 25 mg toprol - home 5 mg eliquis - This patients CHA2DS2-VASc Score and unadjusted Ischemic Stroke Rate (% per year) is equal to 11.2 % stroke rate/year from a score of 7 (female, 2age, CAD, CHF, HTN, DM) - anticoagulation still on hold - would recommend resuming Eliquis.    Suspected GI bleed - GI consulted, no active bleeding on EGD or colonoscopy - CT abdomen pelvis without acute findings - Hb has been stable in the 8 range, despite 1U  PRBC yesterday - Hb 8 - could be dilutional - no signs of bleeding, per patient   CAD s/p PCI ?5 years ago - no PCI in the last 12 months - D/C plavix, do not start ASA - will continue on anticoagulation monotherapy given ?GI bleed   Hypertension - pressures have been well-controlled - continue cardizem, toprol, and ramipril   Hyperlipidemia with LDL goal < 70 - continue 80 mg pravachol - will update lipid panel OP   Chronic diastolic heart failure Dyspnea  - continue home torsemide - echo this admission with preserved EF - she does complain of exertional dyspnea when transferring to [facility] - pt appears volume up - may consider additional demadex today        For questions or updates, please contact [facility] Please consult [email or web address] for contact info under        Signed, Marcelino Duster, PA  11/04/2020, 8:37 AM    History and all data above reviewed.  Patient examined.  I agree with the findings as above.  She has no chest pain or SOB lying in bed  The patient exam reveals GOT:[id number]   ,  Lungs: Clear  ,  Abd: Positive bowel sounds, no rebound no guarding, Ext No edema  .  All  available labs, radiology testing, previous records reviewed. Agree with documented assessment and plan. Atrial fib:  I had a long discussion with the patient and her son yesterday.  Her risk of stroke without DOAC is high.  Therefore I would suggest resuming DOAC.  She will need to have close follow up of her anemia.  If she proves not to tolerate DOAC we could consider Watchman in the future.  I will defer follow up CBC to be arranged by the discharging team with either GI or the primary MD.  We will arrange follow up with Dr. Graciela HusbandsKlein or our Atrial Fib clinic for about six weeks from now.     Fayrene FearingJames Renie Stelmach  11:45 AM  11/04/2020

## 2020-11-04 NOTE — Plan of Care (Signed)
Patient had CT Abdomen and Pelvis done tonight as ordered by GI. Did receive 1 unit of blood today - current Hgb is 8.2. Patient is stable. Will continue to monitor and continue current POC.

## 2020-11-04 NOTE — Plan of Care (Signed)
  Problem: Education: Goal: Knowledge of General Education information will improve Description Including pain rating scale, medication(s)/side effects and non-pharmacologic comfort measures Outcome: Progressing   

## 2020-11-04 NOTE — Anesthesia Postprocedure Evaluation (Signed)
Anesthesia Post Note  Patient: Kirsten Jensen  Procedure(s) Performed: ESOPHAGOGASTRODUODENOSCOPY (EGD) (N/A )     Patient location during evaluation: Endoscopy Anesthesia Type: MAC Level of consciousness: awake and alert Pain management: pain level controlled Vital Signs Assessment: post-procedure vital signs reviewed and stable Respiratory status: spontaneous breathing, nonlabored ventilation, respiratory function stable and patient connected to nasal cannula oxygen Cardiovascular status: blood pressure returned to baseline and stable Postop Assessment: no apparent nausea or vomiting Anesthetic complications: no   No complications documented.  Last Vitals:  Vitals:   11/04/20 0745 11/04/20 1629  BP: 114/63 108/79  Pulse: 80 73  Resp: 14 17  Temp: 36.5 C (!) 36.4 C  SpO2: 95% 100%    Last Pain:  Vitals:   11/04/20 1629  TempSrc: Oral  PainSc:                  [place] S

## 2020-11-04 NOTE — Progress Notes (Addendum)
Brief Nutrition Note  Contacted pt at the request of [facility].   Spoke with pt over telephone, who reports concern about her soft diet. She shares with this RD that she has been struggling with binge eating disorder and has been seeing a therapist and RD as an outpatient to help with this. She shares that this has been complicated due to her transition of living in an assisted living facility. She expresses concern that she does not have an outlined plan for her nutritional care since transitioning to this setting and feels that there is a breakdown in communication between her outpatient care providers.   Pt is currently on a soft diet. RD explained rationale for soft diet. She shares that she is very concerned about calorie intake, in addition to sodium and carbohydrate intake. Specifically, she is unable to receive vegetables on her omelette and feels she is receiving too much sodium and sugary items, which concerns her due to her issues to fluid retention and DM. RD encouraged pt not to focus on a specific calorie level, especially in the acute hospital setting, as nutritional needs are increased in order to support healing of acute illness. Also encouraged pt to continue to follow-up with her outpatient care team to continue to support her binge eating disorder.   Discussed pt prescribed diet (heart healthy, carb modified) which will assist with pt being able to better access the food items that she desires. Discussed case with Dr. Lucianne Muss, who gave this RD verbal permission to adjust diet order per pt request. RD deleted previous diet education materials from AVS, as this is no longer relevant to her plan of care.  Pt expressed gratitude for RD reaching out and had no additional questions at this time.   Kirsten Jensen, RD, LDN, CDCES Registered Dietitian II Certified Diabetes Care and Education Specialist Please refer to [facility] for RD and/or RD on-call/weekend/after hours pager

## 2020-11-05 DIAGNOSIS — I10 Essential (primary) hypertension: Secondary | ICD-10-CM | POA: Diagnosis not present

## 2020-11-05 DIAGNOSIS — I5032 Chronic diastolic (congestive) heart failure: Secondary | ICD-10-CM | POA: Diagnosis not present

## 2020-11-05 DIAGNOSIS — I509 Heart failure, unspecified: Secondary | ICD-10-CM

## 2020-11-05 DIAGNOSIS — I4819 Other persistent atrial fibrillation: Secondary | ICD-10-CM | POA: Diagnosis not present

## 2020-11-05 LAB — COMPREHENSIVE METABOLIC PANEL
ALT: 24 U/L (ref 0–44)
AST: 28 U/L (ref 15–41)
Albumin: 2.8 g/dL — ABNORMAL LOW (ref 3.5–5.0)
Alkaline Phosphatase: 86 U/L (ref 38–126)
Anion gap: 11 (ref 5–15)
BUN: 20 mg/dL (ref 8–23)
CO2: 28 mmol/L (ref 22–32)
Calcium: 9.3 mg/dL (ref 8.9–10.3)
Chloride: 98 mmol/L (ref 98–111)
Creatinine, Ser: 0.87 mg/dL (ref 0.44–1.00)
GFR, Estimated: >60 mL/min (ref >60)
Glucose, Bld: 106 mg/dL — ABNORMAL HIGH (ref 70–99)
Potassium: 4 mmol/L (ref 3.5–5.1)
Sodium: 137 mmol/L (ref 135–145)
Total Bilirubin: 0.8 mg/dL (ref 0.3–1.2)
Total Protein: 5.8 g/dL — ABNORMAL LOW (ref 6.5–8.1)

## 2020-11-05 LAB — GLUCOSE, CAPILLARY
Glucose-Capillary: 108 mg/dL — ABNORMAL HIGH (ref 70–99)
Glucose-Capillary: 124 mg/dL — ABNORMAL HIGH (ref 70–99)
Glucose-Capillary: 157 mg/dL — ABNORMAL HIGH (ref 70–99)
Glucose-Capillary: 179 mg/dL — ABNORMAL HIGH (ref 70–99)
Glucose-Capillary: 220 mg/dL — ABNORMAL HIGH (ref 70–99)
Glucose-Capillary: 228 mg/dL — ABNORMAL HIGH (ref 70–99)

## 2020-11-05 LAB — MAGNESIUM: Magnesium: 1.5 mg/dL — ABNORMAL LOW (ref 1.7–2.4)

## 2020-11-05 LAB — CBC
HCT: 26.5 % — ABNORMAL LOW (ref 36.0–46.0)
Hemoglobin: 8.2 g/dL — ABNORMAL LOW (ref 12.0–15.0)
MCH: 29.2 pg (ref 26.0–34.0)
MCHC: 30.9 g/dL (ref 30.0–36.0)
MCV: 94.3 fL (ref 80.0–100.0)
Platelets: 290 10*3/uL (ref 150–400)
RBC: 2.81 MIL/uL — ABNORMAL LOW (ref 3.87–5.11)
RDW: 15.9 % — ABNORMAL HIGH (ref 11.5–15.5)
WBC: 7 10*3/uL (ref 4.0–10.5)
nRBC: 0 % (ref 0.0–0.2)

## 2020-11-05 LAB — PHOSPHORUS: Phosphorus: 3.1 mg/dL (ref 2.5–4.6)

## 2020-11-05 MED ORDER — POLYETHYLENE GLYCOL 3350 17 G PO PACK
17.0000 g | PACK | Freq: Every day | ORAL | Status: DC
  Administered 2020-11-05 – 2020-11-08 (×2): 17 g via ORAL
  Filled 2020-11-05 (×4): qty 1

## 2020-11-05 MED ORDER — FERROUS SULFATE 325 (65 FE) MG PO TABS: 325.0000 mg | ORAL_TABLET | Freq: Every day | ORAL | 3 refills | Status: AC

## 2020-11-05 MED ORDER — PANTOPRAZOLE SODIUM 40 MG PO TBEC
40.0000 mg | DELAYED_RELEASE_TABLET | Freq: Two times a day (BID) | ORAL | 3 refills | Status: DC
Start: 2020-11-05 — End: 2024-04-25

## 2020-11-05 MED ORDER — FLUCONAZOLE 100 MG PO TABS: 100.0000 mg | ORAL_TABLET | Freq: Every day | ORAL | 0 refills | Status: AC

## 2020-11-05 MED ORDER — ACETAMINOPHEN-CODEINE #3 300-30 MG PO TABS
1.0000 | ORAL_TABLET | Freq: Once | ORAL | Status: AC
  Administered 2020-11-05: 1 via ORAL
  Filled 2020-11-05: qty 1

## 2020-11-05 MED ORDER — FERROUS SULFATE 325 (65 FE) MG PO TABS
325.0000 mg | ORAL_TABLET | Freq: Every day | ORAL | Status: DC
  Administered 2020-11-06 – 2020-11-08 (×3): 325 mg via ORAL
  Filled 2020-11-05 (×3): qty 1

## 2020-11-05 MED ORDER — HYDROCODONE-ACETAMINOPHEN 5-325 MG PO TABS
1.0000 | ORAL_TABLET | Freq: Once | ORAL | Status: DC
  Filled 2020-11-05: qty 1

## 2020-11-05 NOTE — TOC Initial Note (Signed)
Transition of Care (TOC) - Initial/Assessment Note    Patient Details  Name: Kirsten Jensen MRN: [id number] Date of Birth: 11/02/1932  Transition of Care (TOC) CM/SW Contact:    Amerson, Julie M, RN Phone Number: 11/05/2020, 3:08 PM  Clinical Narrative:Patient is an 86 y/o female with PMG of permanent Afib on Eliquis, tachybradycardia syndrome s/p pacemaker placement, atrial flutter, CAD s/p stent x 2, OSA, HTN, dyslipidemia, Type II DM. Patient presented to ED for drop in hemoglobin. Patient was found to have iron deficiency anemia due to chronic GI blood loss. Patient s/p endoscopy 11/20 and colonoscopy 11/21. PTA, pt lives at [facility].  She states facility is poorly staffed, and provides little assistance to her.  She has concerns regarding her discharge today, as she feels she will have to come right back to the hospital.  MD states he has discussed discharge at length with patient, but was unable to reach her son discuss discharge with him.   Met with pt in her room; she was able to reach son, Pat, by phone. He is very unhappy about pt's discharge today, and plans to come to the hospital to discuss with MD/Case manager.  Left MD message to call son to discuss any issues he may have.                    Feel patient does not have a safe discharge plan at this time as she is not ambulating, and is unable to ambulate to the bathroom safely.  Per patient and son, there is not adequate staff to assist patient at her assisted living facility.  Patient and son are aware of their Medicare rights and may choose to appeal the discharge.     Expected Discharge Plan: Assisted Living Barriers to Discharge: Continued Medical Work up          Expected Discharge Plan and Services Expected Discharge Plan: Assisted Living   Discharge Planning Services: CM Consult   Living arrangements for the past 2 months: Assisted Living Facility Expected Discharge Date: 11/05/20                                     Prior Living Arrangements/Services Living arrangements for the past 2 months: Assisted Living Facility Lives with:: Facility Resident Patient language and need for interpreter reviewed:: Yes Do you feel safe going back to the place where you live?: No   Patient states that facility is not adequately staffed, and they will not be able to provide enough assistance for her  Need for Family Participation in Patient Care: Yes (Comment) Care giver support system in place?: Yes (comment)   Criminal Activity/Legal Involvement Pertinent to Current Situation/Hospitalization: No - Comment as needed  Activities of Daily Living Home Assistive Devices/Equipment: None ADL Screening (condition at time of admission) Patient's cognitive ability adequate to safely complete daily activities?: Yes Is the patient deaf or have difficulty hearing?: No Does the patient have difficulty seeing, even when wearing glasses/contacts?: Yes (Patient is legally blind) Does the patient have difficulty concentrating, remembering, or making decisions?: No Patient able to express need for assistance with ADLs?: Yes Does the patient have difficulty dressing or bathing?: No Independently performs ADLs?: Yes (appropriate for developmental age) Does the patient have difficulty walking or climbing stairs?: No Weakness of Legs: None Weakness of Arms/Hands: None  Permission Sought/Granted                    Emotional Assessment Appearance:: Appears stated age Attitude/Demeanor/Rapport: Engaged Affect (typically observed): Afraid/Fearful Orientation: : Oriented to Self, Oriented to Place, Oriented to  Time, Oriented to Situation      Admission diagnosis:  Upper GI bleed [K92.2] Exertional chest pain [R07.9] Gastrointestinal hemorrhage, unspecified gastrointestinal hemorrhage type [K92.2] Acute congestive heart failure, unspecified heart failure type (HCC) [I50.9] Patient Active Problem List    Diagnosis Date Noted  . Upper GI bleed 10/31/2020  . Chronic atrial fibrillation (HCC)   . Advanced nonexudative age-related macular degeneration of both eyes with subfoveal involvement 09/11/2020  . Exudative age-related macular degeneration of both eyes with inactive scar (HCC) 09/11/2020  . Diabetes mellitus without complication (HCC) 09/11/2020  . Chest pain 04/05/2020  . T2DM (type 2 diabetes mellitus) (HCC) 04/05/2020  . HTN (hypertension) 04/05/2020  . Dyslipidemia 04/05/2020  . Morbid obesity with BMI of 45.0-49.9, adult (HCC) 04/05/2020  . Tachycardia-bradycardia syndrome (HCC) 03/17/2020  . Pacemaker 03/17/2020  . Atypical atrial flutter (HCC) 02/05/2020  . Paroxysmal atrial fibrillation (HCC) 11/06/2019  . Secondary hypercoagulable state (HCC) 11/06/2019   PCP:  Wile, Laura H, MD [place]acy[place]   [facility] [place] [address] [place] [address] Phone: [phone] Fax: [phone]     Social Determinants of Health (SDOH) Interventions    Readmission Risk Interventions No flowsheet data found.  Julie W. Amerson, RN, BSN  Trauma/Neuro ICU Case Manager [phone] 

## 2020-11-05 NOTE — Progress Notes (Addendum)
Progress Note  Patient Name: Kirsten Jensen Date of Encounter: 11/05/2020  Primary Cardiologist: Sherryl Manges, MD   Subjective   Doing well today. Long discussion regarding living situation   Inpatient Medications    Scheduled Meds: . apixaban  5 mg Oral BID  . buPROPion  100 mg Oral Daily  . chlorhexidine  15 mL Mouth Rinse BID  . Chlorhexidine Gluconate Cloth  6 each Topical Q0600  . influenza vaccine adjuvanted  0.5 mL Intramuscular Tomorrow-1000  . insulin aspart  0-9 Units Subcutaneous Q4H  . insulin detemir  10 Units Subcutaneous Daily  . levothyroxine  50 mcg Oral QAC breakfast  . mouth rinse  15 mL Mouth Rinse q12n4p  . metoprolol succinate  50 mg Oral Daily  . mupirocin ointment  1 application Nasal BID  . nystatin  5 mL Oral QID  . pantoprazole  40 mg Oral BID AC  . polyethylene glycol-electrolytes  4,000 mL Oral Once  . pravastatin  80 mg Oral Daily  . ramipril  5 mg Oral Daily  . torsemide  20 mg Oral Daily  . vitamin B-12  1,000 mcg Oral Daily   Continuous Infusions: . lactated ringers Stopped (11/01/20 0903)   PRN Meds: benzonatate, guaiFENesin   Vital Signs    Vitals:   11/04/20 1945 11/05/20 0436 11/05/20 0500 11/05/20 0900  BP: 111/71 114/60  (!) 96/55  Pulse: 72 70  78  Resp: 18 19    Temp: 98.2 F (36.8 C) 98.5 F (36.9 C)  97.7 F (36.5 C)  TempSrc: Oral Oral  Oral  SpO2: 99% 100%  95%  Weight:   (!) 143 kg   Height:        Intake/Output Summary (Last 24 hours) at 11/05/2020 0957 Last data filed at 11/05/2020 0900 Gross per 24 hour  Intake 480 ml  Output 900 ml  Net -420 ml   Filed Weights   11/02/20 0359 11/02/20 1411 11/05/20 0500  Weight: (!) 142.2 kg (!) 142.2 kg (!) 143 kg    Physical Exam   General: Overweight, NAD Neck: Negative for carotid bruits. No JVD Lungs:Clear to ausculation bilaterally. No wheezes, rales, or rhonchi. Breathing is unlabored. Cardiovascular: Irregularly irregular. No murmurs Extremities:  No edema. Radial pulses 2+ bilaterally Neuro: Alert and oriented. No focal deficits. No facial asymmetry. MAE spontaneously. Psych: Responds to questions appropriately with normal affect.    Labs    Chemistry Recent Labs  Lab 10/30/20 2027 10/30/20 2027 10/31/20 0258 11/01/20 0216 11/03/20 0940 11/04/20 0051 11/05/20 0209  NA 137   < > 138   < > 138 135 137  K 3.6   < > 3.6   < > 3.5 3.3* 4.0  CL 100   < > 100   < > 99 98 98  CO2 23   < > 26   < > 26 26 28   GLUCOSE 288*   < > 246*   < > 261* 184* 106*  BUN 39*   < > 37*   < > 15 18 20   CREATININE 1.33*   < > 1.25*   < > 0.96 1.10* 0.87  CALCIUM 9.3   < > 9.1   < > 9.0 8.8* 9.3  PROT 6.8  --  6.4*  --   --   --  5.8*  ALBUMIN 3.2*  --  3.1*  --   --   --  2.8*  AST 81*  --  71*  --   --   --  28  ALT 50*  --  47*  --   --   --  24  ALKPHOS 156*  --  155*  --   --   --  86  BILITOT 0.7  --  0.7  --   --   --  0.8  GFRNONAA 39*   < > 42*   < > 58* 49* >60  ANIONGAP 14   < > 12   < > 13 11 11    < > = values in this interval not displayed.     Hematology Recent Labs  Lab 11/03/20 0940 11/03/20 2057 11/04/20 0051 11/04/20 0635 11/05/20 0209  WBC 9.1  --  8.0  --  7.0  RBC 2.90*  --  2.85*  --  2.81*  HGB 8.4*   < > 8.2* 8.0* 8.2*  HCT 27.7*   < > 26.4* 25.9* 26.5*  MCV 95.5  --  92.6  --  94.3  MCH 29.0  --  28.8  --  29.2  MCHC 30.3  --  31.1  --  30.9  RDW 15.0  --  16.0*  --  15.9*  PLT 347  --  278  --  290   < > = values in this interval not displayed.    Cardiac EnzymesNo results for input(s): TROPONINI in the last 168 hours. No results for input(s): TROPIPOC in the last 168 hours.   BNP Recent Labs  Lab 10/30/20 1505  BNP 628.9*     DDimer No results for input(s): DDIMER in the last 168 hours.   Radiology    CT ABDOMEN PELVIS W CONTRAST  Result Date: 11/04/2020 CLINICAL DATA:  Anemia. Possible upper GI bleed. 14 pound weight gain and chest discomfort. Candidiasis and duodenal ulcers demonstrated  at endoscopy. EXAM: CT ABDOMEN AND PELVIS WITH CONTRAST TECHNIQUE: Multidetector CT imaging of the abdomen and pelvis was performed using the standard protocol following bolus administration of intravenous contrast. CONTRAST:  11/06/2020 OMNIPAQUE IOHEXOL 300 MG/ML  SOLN COMPARISON:  None. FINDINGS: Lower chest: Small bilateral pleural effusions with basilar atelectasis. Hepatobiliary: No focal liver abnormality is seen. No gallstones, gallbladder wall thickening, or biliary dilatation. Pancreas: Diffuse fatty atrophy of the pancreas. No inflammatory change or focal mass identified. Spleen: Normal in size without focal abnormality. Adrenals/Urinary Tract: Adrenal glands are unremarkable. Kidneys are normal, without renal calculi, focal lesion, or hydronephrosis. Bladder is unremarkable. Stomach/Bowel: The stomach, small bowel, and colon are not abnormally distended. No wall thickening or inflammatory changes are identified. Diverticulosis of the sigmoid colon. No evidence of diverticulitis. Appendix is not identified. Vascular/Lymphatic: Aortic atherosclerosis. No enlarged abdominal or pelvic lymph nodes. Reproductive: Status post hysterectomy. No adnexal masses. Other: No free air or free fluid in the abdomen. Abdominal wall musculature appears intact. Musculoskeletal: Degenerative changes in the spine. No destructive bone lesions. IMPRESSION: 1. No acute process demonstrated in the abdomen or pelvis. No evidence of bowel obstruction or inflammation. 2. Small bilateral pleural effusions with basilar atelectasis. 3. Diffuse fatty atrophy of the pancreas. 4. Aortic atherosclerosis. Aortic Atherosclerosis (ICD10-I70.0). Electronically Signed   By: M.D.   On: 11/04/2020 01:12   Telemetry    Intermittent pacing - Personally Reviewed  ECG    No new tracing as of 11/05/2020- Personally Reviewed  Cardiac Studies   Echo 10/31/20: 1. Left ventricular ejection fraction, by estimation, is 55 to 60%.  The  left ventricle has normal function. The left ventricle has no regional  wall motion  abnormalities. There is mild concentric left ventricular  hypertrophy. Left ventricular diastolic  function could not be evaluated.  2. Right ventricular systolic function is normal. The right ventricular  size is normal. There is moderately elevated pulmonary artery systolic  pressure. The estimated right ventricular systolic pressure is 58.8 mmHg.  3. The mitral valve is degenerative. Mild mitral valve regurgitation.  4. Tricuspid valve regurgitation is mild to moderate.  5. The aortic valve is tricuspid. There is mild calcification of the  aortic valve. There is mild thickening of the aortic valve. Aortic valve  regurgitation is not visualized. Mild aortic valve sclerosis is present,  with no evidence of aortic valve  stenosis.  6. The inferior vena cava is dilated in size with <50% respiratory  variability, suggesting right atrial pressure of 15 mmHg  Patient Profile     86 y.o. female with a history of tachybradycardia syndrome s/p PPM, CAD, OSA, HTN, DM, and atrial fibrillationwhom we were asked to evaluate in the setting of suspected gastrointestinal bleed and recent chest pressure and dyspnea  Assessment & Plan    1.  Persistent atrial fibrillation on chronic anticoagulation: -Currently maintained on diltiazem 120 mg, Toprol-XL 25 mg -On home Eliquis 5 mg -Given elevated CHA2DS2-VASc score and high risk for CVA would continue anticoagulation at this time with close eye on CBC  2.  Suspected GI bleed: -GI consulted with no active bleeding on EGD or colonoscopy -CT of abdomen and pelvis with no acute findings -Hemoglobin today, 8.2>>stable   3.  CAD s/p PCI: -No recent PCI within 1 year -Plavix discontinued   4.  HTN: -Soft, 96/55>>114/60>>>111/71 -No orthostatic symptoms  -Continue diltiazem, Toprol and oral  5.  HLD: -Continue statin  6.  Chronic diastolic CHF: -On  PTA torsemide -Echocardiogram this admission with preserved LVEF -Net positive 1025L   Signed, Georgie Chard NP-C [facility] Pager: [phone] 11/05/2020, 9:57 AM     For questions or updates, please contact   Please consult [email or web address] for contact info under Cardiology/STEMI.  History and all data above reviewed.  Patient examined.  I agree with the findings as above.  No pain.  No SOB.   The patient exam reveals [id number]   ,  Lungs: Clear  ,  Abd: Positive bowel sounds, no rebound no guarding, Ext Mild edema  .  All available labs, radiology testing, previous records reviewed. Agree with documented assessment and plan. OK to go home with DOAC.  I will defer to primary team to arrange follow up of her Hgb with the primary physician.  She also has follow up with GI. We have arranged follow up in our office for late December.     Fayrene Fearing [facility]  10:54 AM  11/05/2020

## 2020-11-05 NOTE — Discharge Summary (Signed)
Physician Discharge Summary  Shadana Pry BMW:[id number] DOB: 1932-07-06 DOA: 10/30/2020  PCP: Melida Quitter, MD  Admit date: 10/30/2020 Discharge date: 11/05/2020  Admitted From: Home Disposition:  ALF  Recommendations for Outpatient Follow-up:  1. Follow up with PCP in 1-2 weeks 2. Please obtain BMP/CBC in one week   Home Health:No Equipment/Devices:None  Discharge Condition:Stable CODE STATUS: Full Diet recommendation: Heart Healthy   Brief/Interim Summary: 86 y.o. female past medical history significant for chronic atrial fibrillation on Eliquis tachybradycardia syndrome status post pacemaker, essential hypertension, diabetes mellitus type 2, obstructive sleep apnea comes into the hospital due to abnormal labs her hemoglobin dropped from 11-8 she was found to be FOBT positive in the ED, started on IV Protonix drip.  Cardiology was consulted who recommended taking to new metoprolol and proceed with EGD and colonoscopy, GI was consulted the patient underwent EGD and was found to have a nonbleeding ulcer, underwent colonoscopy as that showed diverticulosis and 2 small polyps which were removed.  CT scan of the abdomen and pelvis was unremarkable.  GI did signed off her hemoglobin remained around 1:08 unit of packed red blood cells Eliquis resumed  Discharge Diagnoses:  Active Problems:   Upper GI bleed   Chronic atrial fibrillation (HCC)  Presumed upper GI bleed: Her hemoglobin dropped from 11-8 FOBT was positive in the ED, Eliquis was held she was started on IV Protonix, GI was consulted and she was transfused 1 unit of packed red blood cells she underwent EGD on 11/01/2020-found a nonbleeding ulcer and plaques consistent with candidiasis.  She was started on Diflucan and she will continue her Protonix twice a day at home, she underwent a colonoscopy she was found to have internal and external hemorrhoids diverticulosis with 2 small polyps which were removed.  A CT scan of the abdomen  pelvis was done which was unremarkable. Her hemoglobin on the day of discharge is trending up she will go home on oral iron.  Permanent atrial fibrillation: Eliquis was held her chads Vascor is greater than 5, her heart rate was controlled on metoprolol. GI recommended to restart Eliquis she was monitored for 24 hours and there were no signs of bleeding.  Acute on chronic diastolic heart failure: She received IV Lasix in the ED, echocardiogram showed an EF of 55% he she will resume her torsemide 20 mg daily at home.  Acute kidney injury on chronic kidney disease stage IIIa: With a baseline creatinine of around 1 on admission 1.3 there is resolved with IV fluid hydration.  Elevated troponins: Likely demand ischemia.  CAD, with a history of PCI x2: She will go home off Plavix continue metoprolol. Cardiology recommended to hold Plavix do not resume aspirin continue Eliquis.  Essential hypertension: No changes made to her medication.  Hypothyroidism: Continue Synthroid.  Diabetes mellitus type 2: No change made to her medication.  Severe morbid obesity: Follow-up with PCP.  Discharge Instructions  Discharge Instructions    Diet - low sodium heart healthy   Complete by: As directed    Increase activity slowly   Complete by: As directed      Allergies as of 11/05/2020   No Known Allergies     Medication List    STOP taking these medications   cefdinir 300 MG capsule Commonly known as: OMNICEF   clopidogrel 75 MG tablet Commonly known as: PLAVIX     TAKE these medications   acetaminophen 500 MG tablet Commonly known as: TYLENOL Take 500 mg by mouth 2 (  two) times daily as needed for mild pain.   acetaminophen-codeine 300-30 MG tablet Commonly known as: TYLENOL #3 Take 1 tablet by mouth 3 (three) times daily as needed for moderate pain.   acetic acid 0.25 % irrigation Irrigate with 1 application as directed See admin instructions. to wash perineum once a  week during bathing   albuterol 108 (90 Base) MCG/ACT inhaler Commonly known as: VENTOLIN HFA Inhale 1 puff into the lungs every 4 (four) hours as needed for wheezing or shortness of breath.   benzonatate 100 MG capsule Commonly known as: TESSALON Take 100 mg by mouth 3 (three) times daily as needed for cough.   buPROPion 100 MG tablet Commonly known as: WELLBUTRIN Take 100 mg by mouth daily.   cholecalciferol 25 MCG (1000 UNIT) tablet Commonly known as: VITAMIN D3 Take 1,000 Units by mouth daily.   diltiazem 120 MG 24 hr tablet Commonly known as: CARDIZEM LA Take 120 mg by mouth daily.   Eliquis 5 MG Tabs tablet Generic drug: apixaban Take 5 mg by mouth 2 (two) times daily.   gemfibrozil 600 MG tablet Commonly known as: LOPID Take 600 mg by mouth 2 (two) times daily before a meal.   glipiZIDE 2.5 MG 24 hr tablet Commonly known as: GLUCOTROL XL Take 2.5 mg by mouth daily with breakfast.   guaiFENesin 600 MG 12 hr tablet Commonly known as: MUCINEX Take 600 mg by mouth 2 (two) times daily as needed for cough.   ketoconazole 2 % shampoo Commonly known as: NIZORAL Apply 1 application topically 3 (three) times a week. Let sit for 3 minutes before rinsing.   Tuesday Thursday Saturday   levothyroxine 50 MCG tablet Commonly known as: SYNTHROID Take 50 mcg by mouth daily before breakfast.   magnesium oxide 400 MG tablet Commonly known as: MAG-OX Take 1 tablet (400 mg total) by mouth daily.   metFORMIN 500 MG 24 hr tablet Commonly known as: GLUCOPHAGE-XR Take 1,000 mg by mouth 2 (two) times daily.   metoprolol succinate 25 MG 24 hr tablet Commonly known as: Toprol XL Take 1 tablet (25 mg total) by mouth daily.   nystatin cream Commonly known as: MYCOSTATIN Apply 1 application topically 3 (three) times daily.   OCUVITE ADULT 50+ PO Take 1 capsule by mouth daily.   pantoprazole 40 MG tablet Commonly known as: PROTONIX Take 1 tablet (40 mg total) by mouth 2  (two) times daily before a meal.   pravastatin 80 MG tablet Commonly known as: PRAVACHOL Take 80 mg by mouth daily.   Preparation H 0.25-14-74.9 % rectal ointment Generic drug: phenylephrine-shark liver oil-mineral oil-petrolatum Place 1 application rectally 4 (four) times daily as needed (pain and bleeding).   ramipril 5 MG capsule Commonly known as: ALTACE Take 5 mg by mouth daily.   torsemide 20 MG tablet Commonly known as: DEMADEX Take 1 tablet (20 mg total) by mouth daily.   vitamin B-12 1000 MCG tablet Commonly known as: CYANOCOBALAMIN Take 1,000 mcg by mouth daily.       No Known Allergies  Consultations:  Cardiology  Gastroenterology   Procedures/Studies: DG Chest 2 View  Result Date: 10/30/2020 CLINICAL DATA:  Cough EXAM: CHEST - 2 VIEW COMPARISON:  April 2021 FINDINGS: Mild interstitial prominence appears chronic. No pleural effusion. Mild cardiomegaly. Left chest wall dual lead pacemaker. No acute osseous abnormality. IMPRESSION: Chronic interstitial changes. Electronically Signed   By: Guadlupe Spanish M.D.   On: 10/30/2020 16:11   CT ABDOMEN PELVIS W  CONTRAST  Result Date: 11/04/2020 CLINICAL DATA:  Anemia. Possible upper GI bleed. 14 pound weight gain and chest discomfort. Candidiasis and duodenal ulcers demonstrated at endoscopy. EXAM: CT ABDOMEN AND PELVIS WITH CONTRAST TECHNIQUE: Multidetector CT imaging of the abdomen and pelvis was performed using the standard protocol following bolus administration of intravenous contrast. CONTRAST:  100mL OMNIPAQUE IOHEXOL 300 MG/ML  SOLN COMPARISON:  None. FINDINGS: Lower chest: Small bilateral pleural effusions with basilar atelectasis. Hepatobiliary: No focal liver abnormality is seen. No gallstones, gallbladder wall thickening, or biliary dilatation. Pancreas: Diffuse fatty atrophy of the pancreas. No inflammatory change or focal mass identified. Spleen: Normal in size without focal abnormality. Adrenals/Urinary  Tract: Adrenal glands are unremarkable. Kidneys are normal, without renal calculi, focal lesion, or hydronephrosis. Bladder is unremarkable. Stomach/Bowel: The stomach, small bowel, and colon are not abnormally distended. No wall thickening or inflammatory changes are identified. Diverticulosis of the sigmoid colon. No evidence of diverticulitis. Appendix is not identified. Vascular/Lymphatic: Aortic atherosclerosis. No enlarged abdominal or pelvic lymph nodes. Reproductive: Status post hysterectomy. No adnexal masses. Other: No free air or free fluid in the abdomen. Abdominal wall musculature appears intact. Musculoskeletal: Degenerative changes in the spine. No destructive bone lesions. IMPRESSION: 1. No acute process demonstrated in the abdomen or pelvis. No evidence of bowel obstruction or inflammation. 2. Small bilateral pleural effusions with basilar atelectasis. 3. Diffuse fatty atrophy of the pancreas. 4. Aortic atherosclerosis. Aortic Atherosclerosis (ICD10-I70.0). Electronically Signed   By: Burman NievesWilliam  Stevens M.D.   On: 11/04/2020 01:12   ECHOCARDIOGRAM COMPLETE  Result Date: 10/31/2020    ECHOCARDIOGRAM REPORT   Patient Name:   Vennie HomansCAROL Deese Date of Exam: 10/31/2020 Medical Rec #:  [id number]    Height:       67.0 in Accession #:    [id number]   Weight:       318.8 lb Date of Birth:  05/28/1932     BSA:          2.465 m Patient Age:    86 years     BP:           110/76 mmHg Patient Gender: F            HR:           96 bpm. Exam Location:  Inpatient Procedure: 2D Echo Indications:    acute diastolic chf 428.31  History:        Patient has prior history of Echocardiogram examinations, most                 recent 04/06/2020. Pacemaker, Arrythmias:Atrial Fibrillation and                 Atrial Flutter; Risk Factors:Hypertension and Diabetes.  Sonographer:    Delcie RochLauren Pennington Referring Phys: [id number] CAROLE N HALL  Sonographer Comments: Patient is morbidly obese. Image acquisition challenging due to patient  body habitus. IMPRESSIONS  1. Left ventricular ejection fraction, by estimation, is 55 to 60%. The left ventricle has normal function. The left ventricle has no regional wall motion abnormalities. There is mild concentric left ventricular hypertrophy. Left ventricular diastolic function could not be evaluated.  2. Right ventricular systolic function is normal. The right ventricular size is normal. There is moderately elevated pulmonary artery systolic pressure. The estimated right ventricular systolic pressure is 58.8 mmHg.  3. The mitral valve is degenerative. Mild mitral valve regurgitation.  4. Tricuspid valve regurgitation is mild to moderate.  5. The aortic valve is tricuspid. There is mild  calcification of the aortic valve. There is mild thickening of the aortic valve. Aortic valve regurgitation is not visualized. Mild aortic valve sclerosis is present, with no evidence of aortic valve stenosis.  6. The inferior vena cava is dilated in size with <50% respiratory variability, suggesting right atrial pressure of 15 mmHg. Comparison(s): Changes from prior study are noted. EF 55-60%. Moderately elevated pulmonary pressures. FINDINGS  Left Ventricle: Left ventricular ejection fraction, by estimation, is 55 to 60%. The left ventricle has normal function. The left ventricle has no regional wall motion abnormalities. The left ventricular internal cavity size was normal in size. There is  mild concentric left ventricular hypertrophy. Left ventricular diastolic function could not be evaluated due to atrial fibrillation. Left ventricular diastolic function could not be evaluated. Right Ventricle: The right ventricular size is normal. No increase in right ventricular wall thickness. Right ventricular systolic function is normal. There is moderately elevated pulmonary artery systolic pressure. The tricuspid regurgitant velocity is 3.31 m/s, and with an assumed right atrial pressure of 15 mmHg, the estimated right  ventricular systolic pressure is 58.8 mmHg. Left Atrium: Left atrial size was normal in size. Right Atrium: Right atrial size was normal in size. Pericardium: Trivial pericardial effusion is present. Mitral Valve: The mitral valve is degenerative in appearance. Mild to moderate mitral annular calcification. Mild mitral valve regurgitation. Tricuspid Valve: The tricuspid valve is grossly normal. Tricuspid valve regurgitation is mild to moderate. No evidence of tricuspid stenosis. Aortic Valve: The aortic valve is tricuspid. There is mild calcification of the aortic valve. There is mild thickening of the aortic valve. Aortic valve regurgitation is not visualized. Mild aortic valve sclerosis is present, with no evidence of aortic valve stenosis. Pulmonic Valve: The pulmonic valve was grossly normal. Pulmonic valve regurgitation is not visualized. No evidence of pulmonic stenosis. Aorta: The aortic root and ascending aorta are structurally normal, with no evidence of dilitation. Venous: The inferior vena cava is dilated in size with less than 50% respiratory variability, suggesting right atrial pressure of 15 mmHg. IAS/Shunts: The atrial septum is grossly normal. Additional Comments: A pacer wire is visualized in the right atrium and right ventricle.  LEFT VENTRICLE PLAX 2D LVIDd:         4.70 cm LVIDs:         4.10 cm LV PW:         1.30 cm LV IVS:        1.20 cm  IVC IVC diam: 2.60 cm LEFT ATRIUM             Index       RIGHT ATRIUM           Index LA diam:        4.30 cm 1.74 cm/m  RA Area:     16.20 cm LA Vol (A2C):   72.4 ml 29.37 ml/m RA Volume:   41.40 ml  16.79 ml/m LA Vol (A4C):   55.5 ml 22.51 ml/m LA Biplane Vol: 65.3 ml 26.49 ml/m   AORTA Ao Asc diam: 3.40 cm TRICUSPID VALVE TR Peak grad:   43.8 mmHg TR Vmax:        331.00 cm/s Lennie Odor MD Electronically signed by Lennie Odor MD Signature Date/Time: 10/31/2020/12:35:52 PM    Final     (Echo, Carotid, EGD, Colonoscopy, ERCP)     Subjective: No new complaints feels great.  Discharge Exam: Vitals:   11/05/20 0436 11/05/20 0900  BP: 114/60 (!) 96/55  Pulse: 70 78  Resp: 19   Temp: 98.5 F (36.9 C) 97.7 F (36.5 C)  SpO2: 100% 95%   Vitals:   11/04/20 1945 11/05/20 0436 11/05/20 0500 11/05/20 0900  BP: 111/71 114/60  (!) 96/55  Pulse: 72 70  78  Resp: 18 19    Temp: 98.2 F (36.8 C) 98.5 F (36.9 C)  97.7 F (36.5 C)  TempSrc: Oral Oral  Oral  SpO2: 99% 100%  95%  Weight:   (!) 143 kg   Height:        General: Pt is alert, awake, not in acute distress Cardiovascular: RRR, S1/S2 +, no rubs, no gallops Respiratory: CTA bilaterally, no wheezing, no rhonchi Abdominal: Soft, NT, ND, bowel sounds + Extremities: no edema, no cyanosis    The results of significant diagnostics from this hospitalization (including imaging, microbiology, ancillary and laboratory) are listed below for reference.     Microbiology: Recent Results (from the past 240 hour(s))  Respiratory Panel by RT PCR (Flu A&B, Covid) - Nasopharyngeal Swab     Status: None   Collection Time: 10/30/20 10:52 PM   Specimen: Nasopharyngeal Swab; Nasopharyngeal(NP) swabs in vial transport medium  Result Value Ref Range Status   SARS Coronavirus 2 by RT PCR NEGATIVE NEGATIVE Final    Comment: (NOTE) SARS-CoV-2 target nucleic acids are NOT DETECTED.  The SARS-CoV-2 RNA is generally detectable in upper respiratoy specimens during the acute phase of infection. The lowest concentration of SARS-CoV-2 viral copies this assay can detect is 131 copies/mL. A negative result does not preclude SARS-Cov-2 infection and should not be used as the sole basis for treatment or other patient management decisions. A negative result may occur with  improper specimen collection/handling, submission of specimen other than nasopharyngeal swab, presence of viral mutation(s) within the areas targeted by this assay, and inadequate number of viral  copies (<131 copies/mL). A negative result must be combined with clinical observations, patient history, and epidemiological information. The expected result is Negative.  Fact Sheet for Patients:  https://www.moore.com/  Fact Sheet for Healthcare Providers:  https://www.young.biz/  This test is no t yet approved or cleared by the [place] FDA and  has been authorized for detection and/or diagnosis of SARS-CoV-2 by FDA under an Emergency Use Authorization (EUA). This EUA will remain  in effect (meaning this test can be used) for the duration of the COVID-19 declaration under Section 564(b)(1) of the Act, 21 U.S.C. section 360bbb-3(b)(1), unless the authorization is terminated or revoked sooner.     Influenza A by PCR NEGATIVE NEGATIVE Final   Influenza B by PCR NEGATIVE NEGATIVE Final    Comment: (NOTE) The Xpert Xpress SARS-CoV-2/FLU/RSV assay is intended as an aid in  the diagnosis of influenza from Nasopharyngeal swab specimens and  should not be used as a sole basis for treatment. Nasal washings and  aspirates are unacceptable for Xpert Xpress SARS-CoV-2/FLU/RSV  testing.  Fact Sheet for Patients: https://www.moore.com/  Fact Sheet for Healthcare Providers: https://www.young.biz/  This test is not yet approved or cleared by the [place] FDA and  has been authorized for detection and/or diagnosis of SARS-CoV-2 by  FDA under an Emergency Use Authorization (EUA). This EUA will remain  in effect (meaning this test can be used) for the duration of the  Covid-19 declaration under Section 564(b)(1) of the Act, 21  U.S.C. section 360bbb-3(b)(1), unless the authorization is  terminated or revoked. Performed at [facility], [address] [place], [place] [address]   Surgical pcr  screen     Status: Abnormal   Collection Time: 11/01/20  5:02 AM   Specimen: Nasal Mucosa; Nasal Swab   Result Value Ref Range Status   MRSA, PCR NEGATIVE NEGATIVE Final   Staphylococcus aureus POSITIVE (A) NEGATIVE Final    Comment: (NOTE) The Xpert SA Assay (FDA approved for NASAL specimens in patients 72 years of age and older), is one component of a comprehensive surveillance program. It is not intended to diagnose infection nor to guide or monitor treatment. Performed at [facility], [address] [place], [place] [address]      Labs: BNP (last 3 results) Recent Labs    10/30/20 1505  BNP 628.9*   Basic Metabolic Panel: Recent Labs  Lab 11/01/20 0216 11/02/20 0619 11/03/20 0940 11/04/20 0051 11/05/20 0209  NA 137 139 138 135 137  K 3.5 3.2* 3.5 3.3* 4.0  CL 100 99 99 98 98  CO2 GLUCOSE 192* 147* 261* 184* 106*  BUN 26* CREATININE 1.02* 0.91 0.96 1.10* 0.87  CALCIUM 8.7* 8.8* 9.0 8.8* 9.3  MG 1.3* 1.8 1.8 1.7 1.5*  PHOS 2.4* 2.8 2.9 3.3 3.1   Liver Function Tests: Recent Labs  Lab 10/30/20 2027 10/31/20 0258 11/05/20 0209  AST 81* 71* 28  ALT 50* 47* 24  ALKPHOS 156* 155* 86  BILITOT 0.7 0.7 0.8  PROT 6.8 6.4* 5.8*  ALBUMIN 3.2* 3.1* 2.8*   No results for input(s): LIPASE, AMYLASE in the last 168 hours. No results for input(s): AMMONIA in the last 168 hours. CBC: Recent Labs  Lab 10/30/20 2027 10/30/20 2027 10/31/20 0258 10/31/20 1035 11/03/20 0940 11/03/20 2057 11/04/20 0051 11/04/20 0635 11/05/20 0209  WBC 9.6  --  9.2  --  9.1  --  8.0  --  7.0  NEUTROABS  --   --  7.2  --   --   --   --   --   --   HGB 8.2*   < > 8.1*   < > 8.4* 8.2* 8.2* 8.0* 8.2*  HCT 26.7*   < > 25.9*   < > 27.7* 26.3* 26.4* 25.9* 26.5*  MCV 97.4  --  96.3  --  95.5  --  92.6  --  94.3  PLT 399  --  374  --  347  --  278  --  290   < > = values in this interval not displayed.   Cardiac Enzymes: No results for input(s): CKTOTAL, CKMB, CKMBINDEX, TROPONINI in the last 168 hours. BNP: Invalid input(s): POCBNP CBG: Recent Labs   Lab 11/04/20 1659 11/04/20 1938 11/05/20 0000 11/05/20 0438 11/05/20 0815  GLUCAP 193* 199* 157* 108* 124*   D-Dimer No results for input(s): DDIMER in the last 72 hours. Hgb A1c No results for input(s): HGBA1C in the last 72 hours. Lipid Profile No results for input(s): CHOL, HDL, LDLCALC, TRIG, CHOLHDL, LDLDIRECT in the last 72 hours. Thyroid function studies No results for input(s): TSH, T4TOTAL, T3FREE, THYROIDAB in the last 72 hours.  Invalid input(s): FREET3 Anemia work up No results for input(s): VITAMINB12, FOLATE, FERRITIN, TIBC, IRON, RETICCTPCT in the last 72 hours. Urinalysis No results found for: COLORURINE, APPEARANCEUR, LABSPEC, PHURINE, GLUCOSEU, HGBUR, BILIRUBINUR, KETONESUR, PROTEINUR, UROBILINOGEN, NITRITE, LEUKOCYTESUR Sepsis Labs Invalid input(s): PROCALCITONIN,  WBC,  LACTICIDVEN Microbiology Recent Results (from the past 240 hour(s))  Respiratory Panel by RT PCR (Flu A&B, Covid) - Nasopharyngeal Swab  Status: None   Collection Time: 10/30/20 10:52 PM   Specimen: Nasopharyngeal Swab; Nasopharyngeal(NP) swabs in vial transport medium  Result Value Ref Range Status   SARS Coronavirus 2 by RT PCR NEGATIVE NEGATIVE Final    Comment: (NOTE) SARS-CoV-2 target nucleic acids are NOT DETECTED.  The SARS-CoV-2 RNA is generally detectable in upper respiratoy specimens during the acute phase of infection. The lowest concentration of SARS-CoV-2 viral copies this assay can detect is 131 copies/mL. A negative result does not preclude SARS-Cov-2 infection and should not be used as the sole basis for treatment or other patient management decisions. A negative result may occur with  improper specimen collection/handling, submission of specimen other than nasopharyngeal swab, presence of viral mutation(s) within the areas targeted by this assay, and inadequate number of viral copies (<131 copies/mL). A negative result must be combined with clinical observations,  patient history, and epidemiological information. The expected result is Negative.  Fact Sheet for Patients:  https://www.moore.com/  Fact Sheet for Healthcare Providers:  https://www.young.biz/  This test is no t yet approved or cleared by the [place] FDA and  has been authorized for detection and/or diagnosis of SARS-CoV-2 by FDA under an Emergency Use Authorization (EUA). This EUA will remain  in effect (meaning this test can be used) for the duration of the COVID-19 declaration under Section 564(b)(1) of the Act, 21 U.S.C. section 360bbb-3(b)(1), unless the authorization is terminated or revoked sooner.     Influenza A by PCR NEGATIVE NEGATIVE Final   Influenza B by PCR NEGATIVE NEGATIVE Final    Comment: (NOTE) The Xpert Xpress SARS-CoV-2/FLU/RSV assay is intended as an aid in  the diagnosis of influenza from Nasopharyngeal swab specimens and  should not be used as a sole basis for treatment. Nasal washings and  aspirates are unacceptable for Xpert Xpress SARS-CoV-2/FLU/RSV  testing.  Fact Sheet for Patients: https://www.moore.com/  Fact Sheet for Healthcare Providers: https://www.young.biz/  This test is not yet approved or cleared by the [place] FDA and  has been authorized for detection and/or diagnosis of SARS-CoV-2 by  FDA under an Emergency Use Authorization (EUA). This EUA will remain  in effect (meaning this test can be used) for the duration of the  Covid-19 declaration under Section 564(b)(1) of the Act, 21  U.S.C. section 360bbb-3(b)(1), unless the authorization is  terminated or revoked. Performed at [facility], [address] [place], [place] [address]   Surgical pcr screen     Status: Abnormal   Collection Time: 11/01/20  5:02 AM   Specimen: Nasal Mucosa; Nasal Swab  Result Value Ref Range Status   MRSA, PCR NEGATIVE NEGATIVE Final   Staphylococcus aureus  POSITIVE (A) NEGATIVE Final    Comment: (NOTE) The Xpert SA Assay (FDA approved for NASAL specimens in patients 42 years of age and older), is one component of a comprehensive surveillance program. It is not intended to diagnose infection nor to guide or monitor treatment. Performed at [facility], [address] [place], [place] [address]      Time coordinating discharge: Over 30 minutes  SIGNED:   Marinda Elk, MD  [facility] 11/05/2020, 10:16 AM Pager   If 7PM-7AM, please contact night-coverage [email or web address] Password [email or web address]

## 2020-11-05 NOTE — Progress Notes (Signed)
Physical Therapy Treatment Patient Details Name: Kirsten Jensen MRN: [id number] DOB: 07/22/1932 Today's Date: 11/05/2020    History of Present Illness Patient is an 86 y/o female with PMG of permanent Afib on Eliquis, tachybradycardia syndrome s/p pacemaker placement, atrial flutter, CAD s/p stent x 2, OSA, HTN, dyslipidemia, Type II DM. Patient presented to ED for drop in hemoglobin. Patient was found to have iron deficiency anemia due to chronic GI blood loss. Patient s/p endoscopy 11/20 and colonoscopy 11/21.    PT Comments    Patient progressing towards physical therapy goals. Patient performed sit to stand transfer and stand pivot transfer bed>BSC with RW and min guard. Patient ambulated 6' with RW and min guard, patient noted minimal SOB with activity which is improved from last session. Patient continues to be limited by decreased activity tolerance, impaired balance, generalized weakness, and impaired functional mobility. Continue to recommend HHPT following discharge to maximize functional mobility and independence.     Follow Up Recommendations  Home health PT;Supervision for mobility/OOB     Equipment Recommendations  Wheelchair (measurements PT);Wheelchair cushion (measurements PT);3in1 (PT)    Recommendations for Other Services       Precautions / Restrictions Precautions Precautions: Fall Precaution Comments: legally blind Restrictions Weight Bearing Restrictions: No    Mobility  Bed Mobility Overal bed mobility:  (sitting EOB upon arrival)                Transfers Overall transfer level: Needs assistance Equipment used: Rolling walker (2 wheeled) Transfers: Sit to/from [employer] Sit to Stand: Min guard Stand pivot transfers: Min guard       General transfer comment: min guard for safety  Ambulation/Gait Ambulation/Gait assistance: Min guard Gait Distance (Feet): 6 Feet Assistive device: Rolling walker (2 wheeled) Gait  Pattern/deviations: Step-through pattern;Decreased stride length;Wide base of support     General Gait Details: patient noted improved endurance with minimal SOB upon sitting   Stairs             Wheelchair Mobility    Modified Rankin (Stroke Patients Only)       Balance Overall balance assessment: Needs assistance Sitting-balance support: No upper extremity supported;Feet supported Sitting balance-Leahy Scale: Fair     Standing balance support: Bilateral upper extremity supported;During functional activity Standing balance-Leahy Scale: Poor                              Cognition Arousal/Alertness: Awake/alert Behavior During Therapy: WFL for tasks assessed/performed Overall Cognitive Status: Within Functional Limits for tasks assessed                                        Exercises General Exercises - Lower Extremity Long Arc Quad: Both;10 reps Hip Flexion/Marching: Both;10 reps;Seated    General Comments        Pertinent Vitals/Pain Pain Assessment: No/denies pain    Home Living                      Prior Function            PT Goals (current goals can now be found in the care plan section) Acute Rehab PT Goals Patient Stated Goal: to go home PT Goal Formulation: With patient Time For Goal Achievement: 11/18/20 Potential to Achieve Goals: Good Progress towards PT goals: Progressing toward goals  Frequency    Min 3X/week      PT Plan Current plan remains appropriate    Co-evaluation              AM-PAC PT "6 Clicks" Mobility   Outcome Measure  Help needed turning from your back to your side while in a flat bed without using bedrails?: A Little Help needed moving from lying on your back to sitting on the side of a flat bed without using bedrails?: A Little Help needed moving to and from a bed to a chair (including a wheelchair)?: A Little Help needed standing up from a chair using your arms  (e.g., wheelchair or bedside chair)?: A Little Help needed to walk in hospital room?: A Little Help needed climbing 3-5 steps with a railing? : A Lot 6 Click Score: 17    End of Session Equipment Utilized During Treatment: Gait belt Activity Tolerance: Patient tolerated treatment well Patient left: in chair;with call bell/phone within reach;with nursing/sitter in room Nurse Communication: Mobility status PT Visit Diagnosis: Unsteadiness on feet (R26.81);Other abnormalities of gait and mobility (R26.89);Muscle weakness (generalized) (M62.81)     Time: 1478-2956 PT Time Calculation (min) (ACUTE ONLY): 29 min  Charges:  $Therapeutic Activity: 23-37 mins                     Gregor Hams, PT, DPT Acute Rehabilitation Services Pager [phone] Office [phone]    Elissa Lovett 11/05/2020, 5:38 PM

## 2020-11-05 NOTE — [place] FL2 (Signed)
[place] MEDICAID FL2 LEVEL OF CARE SCREENING TOOL     IDENTIFICATION  Patient Name: Kirsten Jensen Birthdate: [birth year] Sex: female Admission Date (Current Location): 10/30/2020  [facility] and [place] Number:  Producer, television/film/video and Address:  [facility], [address] [place], [place] [address]      Provider Number: [id number]  Attending Physician Name and Address:  Marinda Elk, MD  Relative Name and Phone Number:  Marua Qin, son; [phone]    Current Level of Care: Hospital Recommended Level of Care: Assisted Living Facility Prior Approval Number:    Date Approved/Denied:   PASRR Number:    Discharge Plan: Other (Comment) (ALF)    Current Diagnoses: Patient Active Problem List   Diagnosis Date Noted  . Upper GI bleed 10/31/2020  . Chronic atrial fibrillation (HCC)   . Advanced nonexudative age-related macular degeneration of both eyes with subfoveal involvement 09/11/2020  . Exudative age-related macular degeneration of both eyes with inactive scar (HCC) 09/11/2020  . Diabetes mellitus without complication (HCC) 09/11/2020  . Chest pain 04/05/2020  . T2DM (type 2 diabetes mellitus) (HCC) 04/05/2020  . HTN (hypertension) 04/05/2020  . Dyslipidemia 04/05/2020  . Morbid obesity with BMI of 45.0-49.9, adult (HCC) 04/05/2020  . Tachycardia-bradycardia syndrome (HCC) 03/17/2020  . Pacemaker 03/17/2020  . Atypical atrial flutter (HCC) 02/05/2020  . Paroxysmal atrial fibrillation (HCC) 11/06/2019  . Secondary hypercoagulable state (HCC) 11/06/2019    Orientation RESPIRATION BLADDER Height & Weight     Self, Time, Situation, Place  Normal Continent Weight: (!) 143 kg Height:  5\' 7"  (170.2 cm)  BEHAVIORAL SYMPTOMS/MOOD NEUROLOGICAL BOWEL NUTRITION STATUS      Continent Diet (Heart healthy carb modified thin liquids)  AMBULATORY STATUS COMMUNICATION OF NEEDS Skin   Supervision Verbally Normal                        Personal Care Assistance Level of Assistance  Bathing Bathing Assistance: Limited assistance         Functional Limitations Info             SPECIAL CARE FACTORS FREQUENCY  PT (By licensed PT)     PT Frequency: 2-3 times weekly              Contractures Contractures Info: Not present    Additional Factors Info  Code Status, Allergies, Insulin Sliding Scale Code Status Info: Full code Allergies Info: No known allergies   Insulin Sliding Scale Info: Novolog 0-9 units every 4 hours/sliding scale       Current Medications (11/05/2020):  This is the current hospital active medication list Current Facility-Administered Medications  Medication Dose Route Frequency Provider Last Rate Last Admin  . apixaban (ELIQUIS) tablet 5 mg  5 mg Oral BID 11/07/2020, MD   5 mg at 11/05/20 0947  . benzonatate (TESSALON) capsule 200 mg  200 mg Oral TID PRN 11/07/20, MD   200 mg at 11/04/20 1719  . buPROPion [facility]) tablet 100 mg  100 mg Oral Daily [facility], MD   100 mg at 11/05/20 0947  . chlorhexidine (PERIDEX) 0.12 % solution 15 mL  15 mL Mouth Rinse BID 11/07/20, MD   15 mL at 11/05/20 0945  . Chlorhexidine Gluconate Cloth 2 % PADS 6 each  6 each Topical Q0600 11/07/20, MD   6 each at 11/05/20 0618  . [START ON 11/06/2020] ferrous sulfate tablet 325 mg  325 mg  Oral Q breakfast Marinda Elk, MD      . guaiFENesin [facility]) 12 hr tablet 600 mg  600 mg Oral BID PRN Cipriano Bunker, MD   600 mg at 11/04/20 1212  . influenza vaccine adjuvanted (FLUAD) injection 0.5 mL  0.5 mL Intramuscular Tomorrow-1000 Cipriano Bunker, MD      . insulin aspart (novoLOG) injection 0-9 Units  0-9 Units Subcutaneous Q4H Vida Rigger, MD   2 Units at 11/05/20 1146  . insulin detemir (LEVEMIR) injection 10 Units  10 Units Subcutaneous Daily Cipriano Bunker, MD   10 Units at 11/05/20 0947  . lactated ringers infusion   Intravenous Continuous Vida Rigger, MD   Stopped at 11/01/20 [phone]   . levothyroxine (SYNTHROID) tablet 50 mcg  50 mcg Oral QAC breakfast Vida Rigger, MD   50 mcg at 11/05/20 0945  . MEDLINE mouth rinse  15 mL Mouth Rinse q12n4p Vida Rigger, MD   15 mL at 11/05/20 1147  . metoprolol succinate (TOPROL-XL) 24 hr tablet 50 mg  50 mg Oral Daily Vida Rigger, MD   50 mg at 11/04/20 0859  . mupirocin ointment (BACTROBAN) 2 % 1 application  1 application Nasal BID Vida Rigger, MD   1 application at 11/05/20 0950  . nystatin (MYCOSTATIN) 100000 UNIT/ML suspension 500,000 Units  5 mL Oral QID Vida Rigger, MD   500,000 Units at 11/05/20 0946  . pantoprazole (PROTONIX) EC tablet 40 mg  40 mg Oral BID AC Vida Rigger, MD   40 mg at 11/05/20 0945  . polyethylene glycol (MIRALAX / GLYCOLAX) packet 17 g  17 g Oral Daily Marinda Elk, MD   17 g at 11/05/20 1147  . pravastatin (PRAVACHOL) tablet 80 mg  80 mg Oral Daily Vida Rigger, MD   80 mg at 11/05/20 0946  . ramipril (ALTACE) capsule 5 mg  5 mg Oral Daily Vida Rigger, MD   5 mg at 11/05/20 0946  . torsemide (DEMADEX) tablet 20 mg  20 mg Oral Daily Vida Rigger, MD   20 mg at 11/05/20 0946  . vitamin B-12 (CYANOCOBALAMIN) tablet 1,000 mcg  1,000 mcg Oral Daily Vida Rigger, MD   1,000 mcg at 11/05/20 0945     Discharge Medications: Please see discharge summary for a list of discharge medications.  Relevant Imaging Results:  Relevant Lab Results:   Additional Information SS# [id number]    Quintella Baton, RN, BSN  Trauma/Neuro ICU Case Manager [phone]

## 2020-11-05 NOTE — Progress Notes (Signed)
11/23 2200 Pt is sable. Pt refused SCDs. Pt encouraged and educated. RN will continue to assess pt readiness to go on SCDs.

## 2020-11-05 NOTE — Plan of Care (Signed)

## 2020-11-06 DIAGNOSIS — I5021 Acute systolic (congestive) heart failure: Secondary | ICD-10-CM | POA: Diagnosis not present

## 2020-11-06 DIAGNOSIS — R079 Chest pain, unspecified: Secondary | ICD-10-CM | POA: Diagnosis not present

## 2020-11-06 DIAGNOSIS — I5032 Chronic diastolic (congestive) heart failure: Secondary | ICD-10-CM

## 2020-11-06 DIAGNOSIS — I509 Heart failure, unspecified: Secondary | ICD-10-CM

## 2020-11-06 DIAGNOSIS — K922 Gastrointestinal hemorrhage, unspecified: Secondary | ICD-10-CM | POA: Diagnosis not present

## 2020-11-06 LAB — GLUCOSE, CAPILLARY
Glucose-Capillary: 138 mg/dL — ABNORMAL HIGH (ref 70–99)
Glucose-Capillary: 184 mg/dL — ABNORMAL HIGH (ref 70–99)
Glucose-Capillary: 191 mg/dL — ABNORMAL HIGH (ref 70–99)
Glucose-Capillary: 230 mg/dL — ABNORMAL HIGH (ref 70–99)
Glucose-Capillary: 232 mg/dL — ABNORMAL HIGH (ref 70–99)
Glucose-Capillary: 253 mg/dL — ABNORMAL HIGH (ref 70–99)
Glucose-Capillary: 327 mg/dL — ABNORMAL HIGH (ref 70–99)

## 2020-11-06 MED ORDER — NAPHAZOLINE-GLYCERIN 0.012-0.2 % OP SOLN
1.0000 [drp] | Freq: Four times a day (QID) | OPHTHALMIC | Status: DC | PRN
  Administered 2020-11-06: 2 [drp] via OPHTHALMIC
  Filled 2020-11-06: qty 15

## 2020-11-06 NOTE — Progress Notes (Signed)
   Noted reviewed - Dr. Antoine Poche has signed off and arranged follow-up in December.  [facility] will sign off.   Medication Recommendations:  As above Other recommendations (labs, testing, etc):  none Follow up as an outpatient:   Otilio Saber, PA-C in December  Chrystie Nose, MD, [facility], FACP  [place]  [facility]  Medical Director of the Advanced Lipid Disorders &  Cardiovascular Risk Reduction Clinic Diplomate of the American Board of Clinical Lipidology Attending Cardiologist  Direct Dial: [phone]  Fax: [phone]  Website:  [email or web address]

## 2020-11-06 NOTE — Progress Notes (Signed)
Met with pt to discuss the D/C plan. Per pt, her daughter-in-law appealed the D/C order last night. Received fax from [facility] and medical records faxed ([phone]). Will continue to f/u to assist with the D/C plan. 

## 2020-11-06 NOTE — Progress Notes (Signed)
[facility] PROGRESS NOTE    Progress Note  Kirsten Jensen  YDX:[id number] DOB: 08/17/1932 DOA: 10/30/2020 PCP: Melida Quitter, MD     Brief Narrative:   Kirsten Jensen is an 86 y.o. female past medical history significant for chronic atrial fibrillation on Eliquis tachybradycardia syndrome status post pacemaker, essential hypertension, diabetes mellitus type 2, obstructive sleep apnea comes into the hospital due to abnormal labs her hemoglobin dropped from 11-8 she was found to be FOBT positive in the ED, started on IV Protonix drip.  Cardiology was consulted who recommended taking to new metoprolol and proceed with EGD and colonoscopy, GI was consulted the patient underwent EGD and was found to have a nonbleeding ulcer, underwent colonoscopy as that showed diverticulosis and 2 small polyps which were removed.  CT scan of the abdomen and pelvis was unremarkable.  GI did signed off her hemoglobin remained around 1:08 unit of packed red blood cells Eliquis resumed. Her discharge is anticipated on 11/05/2020  Assessment/Plan:   Presumed upper GI bleed: She is status post 1 unit of packed red blood cells underwent EGD and colonoscopy she had a nonbleeding ulcer and 2 polyps with internal and external hemorrhoids. No signs of overt bleeding CT scan of the abdomen pelvis unremarkable. Patient was scheduled to go to ALF but she has appealed her discharge. Repeat a CBC tomorrow morning.  Chronic fibrillation: With a chads Vascor greater than 5 heart rate controlled metoprolol continue Eliquis no signs of bleeding.  Acute on chronic diastolic heart failure: Continue torsemide daily.  Acute kidney injury on chronic kidney disease stage IIIa: Her creatinine appears to be at baseline likely prerenal azotemia.  Elevated troponins: Likely demand ischemia.  She denies any chest pain.  CAD with a history of PCI x2: Had recommended no further ischemic work-up she could go home off Plavix  continue metoprolol and Eliquis.  Essential hypertension: No change made to her blood pressure medication.  Hypothyroidism: Continue Synthroid.  Diabetes mellitus type 2: No changes in medication. Blood glucose fairly controlled   DVT prophylaxis: Eliquis Family Communication: Son Status is: Inpatient  Remains inpatient appropriate because:Hemodynamically unstable   Dispo:  Patient From: Home  Planned Disposition: Home  Expected discharge date: 11/04/20  Medically stable for discharge: No         Code Status:     Code Status Orders  (From admission, onward)         Start     Ordered   10/31/20 0224  Full code  Continuous        10/31/20 0223        Code Status History    Date Active Date Inactive Code Status Order ID Comments User Context   04/05/2020 0332 04/06/2020 2053 Full Code [id number]  Joselyn Arrow, MD ED   Advance Care Planning Activity        IV Access:    Peripheral IV   Procedures and diagnostic studies:   No results found.   Medical Consultants:    None.  Anti-Infectives:   none  Subjective:    Kirsten Jensen complaining of left eye dryness  Objective:    Vitals:   11/05/20 2138 11/06/20 0405 11/06/20 0439 11/06/20 0741  BP: 113/77 112/66  111/65  Pulse: 69 74  72  Resp: 15 16  14   Temp: 98 F (36.7 C) 97.6 F (36.4 C)  (!) 97.4 F (36.3 C)  TempSrc: Oral Oral  Oral  SpO2: 94% 98%  95%  Weight:   )  158.8 kg   Height:       SpO2: 95 % O2 Flow Rate (L/min): 3 L/min   Intake/Output Summary (Last 24 hours) at 11/06/2020 0906 Last data filed at 11/06/2020 0800 Gross per 24 hour  Intake 720 ml  Output --  Net 720 ml   Filed Weights   11/02/20 1411 11/05/20 0500 11/06/20 0439  Weight: (!) 142.2 kg (!) 143 kg (!) 158.8 kg    Exam: General exam: In no acute distress. Respiratory system: Good air movement and clear to auscultation. Cardiovascular system: S1 & S2 heard, RRR. No  JVD. Gastrointestinal system: Abdomen is nondistended, soft and nontender.  Extremities: No pedal edema. Skin: No rashes, lesions or ulcers  Data Reviewed:    Labs: Basic Metabolic Panel: Recent Labs  Lab 11/01/20 0216 11/01/20 0216 11/02/20 9622 11/02/20 2979 11/03/20 0940 11/03/20 0940 11/04/20 0051 11/05/20 0209  NA 137  --  139  --  138  --  135 137  K 3.5   < > 3.2*   < > 3.5   < > 3.3* 4.0  CL 100  --  99  --  99  --  98 98  CO2 26  --  30  --  26  --  26 28  GLUCOSE 192*  --  147*  --  261*  --  184* 106*  BUN 26*  --  18  --  15  --  18 20  CREATININE 1.02*  --  0.91  --  0.96  --  1.10* 0.87  CALCIUM 8.7*  --  8.8*  --  9.0  --  8.8* 9.3  MG 1.3*  --  1.8  --  1.8  --  1.7 1.5*  PHOS 2.4*  --  2.8  --  2.9  --  3.3 3.1   < > = values in this interval not displayed.   GFR Estimated Creatinine Clearance: 73.6 mL/min (by C-G formula based on SCr of 0.87 mg/dL). Liver Function Tests: Recent Labs  Lab 10/30/20 2027 10/31/20 0258 11/05/20 0209  AST 81* 71* 28  ALT 50* 47* 24  ALKPHOS 156* 155* 86  BILITOT 0.7 0.7 0.8  PROT 6.8 6.4* 5.8*  ALBUMIN 3.2* 3.1* 2.8*   No results for input(s): LIPASE, AMYLASE in the last 168 hours. No results for input(s): AMMONIA in the last 168 hours. Coagulation profile Recent Labs  Lab 10/31/20 0041  INR 2.0*   COVID-19 Labs  No results for input(s): DDIMER, FERRITIN, LDH, CRP in the last 72 hours.  Lab Results  Component Value Date   SARSCOV2NAA NEGATIVE 10/30/2020   SARSCOV2NAA NEGATIVE 04/05/2020    CBC: Recent Labs  Lab 10/30/20 2027 10/30/20 2027 10/31/20 0258 10/31/20 1035 11/03/20 0940 11/03/20 2057 11/04/20 0051 11/04/20 0635 11/05/20 0209  WBC 9.6  --  9.2  --  9.1  --  8.0  --  7.0  NEUTROABS  --   --  7.2  --   --   --   --   --   --   HGB 8.2*   < > 8.1*   < > 8.4* 8.2* 8.2* 8.0* 8.2*  HCT 26.7*   < > 25.9*   < > 27.7* 26.3* 26.4* 25.9* 26.5*  MCV 97.4  --  96.3  --  95.5  --  92.6  --  94.3   PLT 399  --  374  --  347  --  278  --  290   < > =  values in this interval not displayed.   Cardiac Enzymes: No results for input(s): CKTOTAL, CKMB, CKMBINDEX, TROPONINI in the last 168 hours. BNP (last 3 results) No results for input(s): PROBNP in the last 8760 hours. CBG: Recent Labs  Lab 11/05/20 1750 11/05/20 2141 11/06/20 0019 11/06/20 0354 11/06/20 0740  GLUCAP 220* 228* 232* 184* 138*   D-Dimer: No results for input(s): DDIMER in the last 72 hours. Hgb A1c: No results for input(s): HGBA1C in the last 72 hours. Lipid Profile: No results for input(s): CHOL, HDL, LDLCALC, TRIG, CHOLHDL, LDLDIRECT in the last 72 hours. Thyroid function studies: No results for input(s): TSH, T4TOTAL, T3FREE, THYROIDAB in the last 72 hours.  Invalid input(s): FREET3 Anemia work up: No results for input(s): VITAMINB12, FOLATE, FERRITIN, TIBC, IRON, RETICCTPCT in the last 72 hours. Sepsis Labs: Recent Labs  Lab 10/31/20 0258 11/03/20 0940 11/04/20 0051 11/05/20 0209  WBC 9.2 9.1 8.0 7.0   Microbiology Recent Results (from the past 240 hour(s))  Respiratory Panel by RT PCR (Flu A&B, Covid) - Nasopharyngeal Swab     Status: None   Collection Time: 10/30/20 10:52 PM   Specimen: Nasopharyngeal Swab; Nasopharyngeal(NP) swabs in vial transport medium  Result Value Ref Range Status   SARS Coronavirus 2 by RT PCR NEGATIVE NEGATIVE Final    Comment: (NOTE) SARS-CoV-2 target nucleic acids are NOT DETECTED.  The SARS-CoV-2 RNA is generally detectable in upper respiratoy specimens during the acute phase of infection. The lowest concentration of SARS-CoV-2 viral copies this assay can detect is 131 copies/mL. A negative result does not preclude SARS-Cov-2 infection and should not be used as the sole basis for treatment or other patient management decisions. A negative result may occur with  improper specimen collection/handling, submission of specimen other than nasopharyngeal swab,  presence of viral mutation(s) within the areas targeted by this assay, and inadequate number of viral copies (<131 copies/mL). A negative result must be combined with clinical observations, patient history, and epidemiological information. The expected result is Negative.  Fact Sheet for Patients:  https://www.moore.com/  Fact Sheet for Healthcare Providers:  https://www.young.biz/  This test is no t yet approved or cleared by the [place] FDA and  has been authorized for detection and/or diagnosis of SARS-CoV-2 by FDA under an Emergency Use Authorization (EUA). This EUA will remain  in effect (meaning this test can be used) for the duration of the COVID-19 declaration under Section 564(b)(1) of the Act, 21 U.S.C. section 360bbb-3(b)(1), unless the authorization is terminated or revoked sooner.     Influenza A by PCR NEGATIVE NEGATIVE Final   Influenza B by PCR NEGATIVE NEGATIVE Final    Comment: (NOTE) The Xpert Xpress SARS-CoV-2/FLU/RSV assay is intended as an aid in  the diagnosis of influenza from Nasopharyngeal swab specimens and  should not be used as a sole basis for treatment. Nasal washings and  aspirates are unacceptable for Xpert Xpress SARS-CoV-2/FLU/RSV  testing.  Fact Sheet for Patients: https://www.moore.com/  Fact Sheet for Healthcare Providers: https://www.young.biz/  This test is not yet approved or cleared by the [place] FDA and  has been authorized for detection and/or diagnosis of SARS-CoV-2 by  FDA under an Emergency Use Authorization (EUA). This EUA will remain  in effect (meaning this test can be used) for the duration of the  Covid-19 declaration under Section 564(b)(1) of the Act, 21  U.S.C. section 360bbb-3(b)(1), unless the authorization is  terminated or revoked. Performed at [facility], [address] [place],  [place] [address]   Surgical pcr  screen     Status: Abnormal   Collection Time: 11/01/20  5:02 AM   Specimen: Nasal Mucosa; Nasal Swab  Result Value Ref Range Status   MRSA, PCR NEGATIVE NEGATIVE Final   Staphylococcus aureus POSITIVE (A) NEGATIVE Final    Comment: (NOTE) The Xpert SA Assay (FDA approved for NASAL specimens in patients 84 years of age and older), is one component of a comprehensive surveillance program. It is not intended to diagnose infection nor to guide or monitor treatment. Performed at [facility], [address] [place], [place] [address]      Medications:   . apixaban  5 mg Oral BID  . buPROPion  100 mg Oral Daily  . chlorhexidine  15 mL Mouth Rinse BID  . ferrous sulfate  325 mg Oral Q breakfast  . influenza vaccine adjuvanted  0.5 mL Intramuscular Tomorrow-1000  . insulin aspart  0-9 Units Subcutaneous Q4H  . insulin detemir  10 Units Subcutaneous Daily  . levothyroxine  50 mcg Oral QAC breakfast  . mouth rinse  15 mL Mouth Rinse q12n4p  . metoprolol succinate  50 mg Oral Daily  . mupirocin ointment  1 application Nasal BID  . nystatin  5 mL Oral QID  . pantoprazole  40 mg Oral BID AC  . polyethylene glycol  17 g Oral Daily  . pravastatin  80 mg Oral Daily  . ramipril  5 mg Oral Daily  . torsemide  20 mg Oral Daily  . vitamin B-12  1,000 mcg Oral Daily   Continuous Infusions: . lactated ringers Stopped (11/01/20 0903)      LOS: 6 days   Marinda ElkAbraham Feliz Ortiz  [facility]  11/06/2020, 9:06 AM

## 2020-11-07 DIAGNOSIS — I509 Heart failure, unspecified: Secondary | ICD-10-CM | POA: Diagnosis not present

## 2020-11-07 DIAGNOSIS — K922 Gastrointestinal hemorrhage, unspecified: Secondary | ICD-10-CM | POA: Diagnosis not present

## 2020-11-07 DIAGNOSIS — R079 Chest pain, unspecified: Secondary | ICD-10-CM | POA: Diagnosis not present

## 2020-11-07 DIAGNOSIS — I5021 Acute systolic (congestive) heart failure: Secondary | ICD-10-CM | POA: Diagnosis not present

## 2020-11-07 LAB — GLUCOSE, CAPILLARY
Glucose-Capillary: 181 mg/dL — ABNORMAL HIGH (ref 70–99)
Glucose-Capillary: 189 mg/dL — ABNORMAL HIGH (ref 70–99)
Glucose-Capillary: 162 mg/dL — ABNORMAL HIGH (ref 70–99)
Glucose-Capillary: 258 mg/dL — ABNORMAL HIGH (ref 70–99)
Glucose-Capillary: 84 mg/dL (ref 70–99)

## 2020-11-07 LAB — CBC
HCT: 27.4 % — ABNORMAL LOW (ref 36.0–46.0)
Hemoglobin: 8.6 g/dL — ABNORMAL LOW (ref 12.0–15.0)
MCH: 29.2 pg (ref 26.0–34.0)
MCHC: 31.4 g/dL (ref 30.0–36.0)
MCV: 92.9 fL (ref 80.0–100.0)
Platelets: 277 10*3/uL (ref 150–400)
RBC: 2.95 MIL/uL — ABNORMAL LOW (ref 3.87–5.11)
RDW: 15.7 % — ABNORMAL HIGH (ref 11.5–15.5)
WBC: 7.2 10*3/uL (ref 4.0–10.5)
nRBC: 0 % (ref 0.0–0.2)

## 2020-11-07 MED ORDER — ACETAMINOPHEN-CODEINE #3 300-30 MG PO TABS: 1.0000 | ORAL_TABLET | Freq: Three times a day (TID) | ORAL | Status: DC | PRN

## 2020-11-07 MED ORDER — INSULIN ASPART 100 UNIT/ML ~~LOC~~ SOLN
0.0000 [IU] | Freq: Every day | SUBCUTANEOUS | Status: DC
  Administered 2020-11-07: 3 [IU] via SUBCUTANEOUS

## 2020-11-07 MED ORDER — BENZONATATE 100 MG PO CAPS: 100.0000 mg | ORAL_CAPSULE | Freq: Three times a day (TID) | ORAL | Status: DC | PRN

## 2020-11-07 MED ORDER — INSULIN ASPART 100 UNIT/ML ~~LOC~~ SOLN
6.0000 [IU] | Freq: Three times a day (TID) | SUBCUTANEOUS | Status: DC
  Administered 2020-11-07 – 2020-11-08 (×4): 6 [IU] via SUBCUTANEOUS

## 2020-11-07 MED ORDER — INSULIN ASPART 100 UNIT/ML ~~LOC~~ SOLN
0.0000 [IU] | Freq: Three times a day (TID) | SUBCUTANEOUS | Status: DC
  Administered 2020-11-07 (×2): 4 [IU] via SUBCUTANEOUS
  Administered 2020-11-08: 7 [IU] via SUBCUTANEOUS

## 2020-11-07 NOTE — TOC Transition Note (Signed)
Transition of Care [facility]) - CM/SW Discharge Note   Patient Details  Name: Kirsten Jensen MRN: [id number] Date of Birth: 13-May-1932  Transition of Care [facility]) CM/SW Contact:  Janae Bridgeman, RN Phone Number: 11/07/2020, 5:27 PM   Clinical Narrative:    Case management spoke with the [employer] appeals and the patient lost the appeal to stay in the hospital as an inpatient.  I presented the patient the information regarding the [employer] appeal and the patient was given the written paperwork in accordance.  The patient was given knowledge regarding home health services that would be started with [facility] for PT.  Orders for home health was completed.  The patient states that she was ambulating better today and does not need the wheelchair that was suggested as dme by the last PT note.  The primary nurse and physician were notified.   Final next level of care: Assisted Living Barriers to Discharge:  ([employer] appeal lost by the patient.)   Patient Goals and CMS Choice Patient states their goals for this hospitalization and ongoing recovery are:: Patient is planning to discharge home to the assisted living. CMS Medicare.gov Compare Post Acute Care list provided to:: Patient Choice offered to / list presented to : Patient  Discharge Placement                       Discharge Plan and Services   Discharge Planning Services: CM Consult Post Acute Care Choice: Home Health (patient declined WC)          DME Arranged:  (patient declined WC)         HH Arranged: PT HH Agency: [facility] (now [facility]) Date HH Agency Contacted: 11/07/20 Time HH Agency Contacted: 1726 Representative spoke with at [facility] Agency: Mellissa Kohut, [facility]  Social Determinants of Health (SDOH) Interventions     Readmission Risk Interventions No flowsheet data found.

## 2020-11-07 NOTE — Progress Notes (Signed)
[facility] PROGRESS NOTE    Progress Note  Kirsten HomansCarol Sather  ZOX:[id number] DOB: 05/19/1932 DOA: 10/30/2020 PCP: Melida QuitterWile, Laura H, MD     Brief Narrative:   Kirsten Jensen is an 86 y.o. female past medical history significant for chronic atrial fibrillation on Eliquis tachybradycardia syndrome status post pacemaker, essential hypertension, diabetes mellitus type 2, obstructive sleep apnea comes into the hospital due to abnormal labs her hemoglobin dropped from 11-8 she was found to be FOBT positive in the ED, started on IV Protonix drip.  Cardiology was consulted who recommended taking to new metoprolol and proceed with EGD and colonoscopy, GI was consulted the patient underwent EGD and was found to have a nonbleeding ulcer, underwent colonoscopy as that showed diverticulosis and 2 small polyps which were removed.  CT scan of the abdomen and pelvis was unremarkable.  GI did signed off her hemoglobin remained around 1:08 unit of packed red blood cells Eliquis resumed. Her discharge is anticipated on 11/05/2020  Assessment/Plan:   Presumed upper GI bleed: Hemoglobin is trending up, continue ferrous sulfate orally.  Chronic fibrillation: With a chads Vascor greater than 5 heart rate controlled metoprolol continue Eliquis no signs of bleeding.  Acute on chronic diastolic heart failure: Continue torsemide daily.  Acute kidney injury on chronic kidney disease stage IIIa: Her creatinine appears to be at baseline likely prerenal azotemia.  Elevated troponins: Likely demand ischemia.  She denies any chest pain.  CAD with a history of PCI x2: Had recommended no further ischemic work-up she could go home off Plavix continue metoprolol and Eliquis.  Essential hypertension: No change made to her blood pressure medication.  Hypothyroidism: Continue Synthroid.  Diabetes mellitus type 2: Glucose increasing continue long-acting insulin change her sliding scale.   DVT prophylaxis:  Eliquis Family Communication: Son Status is: Inpatient  Remains inpatient appropriate because:Hemodynamically unstable   Dispo:  Patient From: Home  Planned Disposition: Home  Expected discharge date: 11/04/20  Medically stable for discharge: No awaiting placement to ALF she has appealed her discharge.     Code Status:     Code Status Orders  (From admission, onward)         Start     Ordered   10/31/20 0224  Full code  Continuous        10/31/20 0223        Code Status History    Date Active Date Inactive Code Status Order ID Comments User Context   04/05/2020 0332 04/06/2020 2053 Full Code [id number]  Joselyn ArrowFair, Chelsea N, MD ED   Advance Care Planning Activity        IV Access:    Peripheral IV   Procedures and diagnostic studies:   No results found.   Medical Consultants:    None.  Anti-Infectives:   none  Subjective:    Kirsten Homansarol Violet no complaints this morning.  Objective:    Vitals:   11/06/20 1618 11/06/20 1934 11/07/20 0300 11/07/20 0420  BP: 115/74 (!) 107/44 (!) 112/58   Pulse: 85 63 88   Resp: 15 18 17    Temp: 98 F (36.7 C) 98 F (36.7 C) 97.9 F (36.6 C)   TempSrc: Oral  Oral   SpO2: 95% 100% 97%   Weight:    (!) 148.9 kg  Height:       SpO2: 97 % O2 Flow Rate (L/min): 3 L/min   Intake/Output Summary (Last 24 hours) at 11/07/2020 0905 Last data filed at 11/07/2020 0600 Gross per 24 hour  Intake 480 ml  Output 500 ml  Net -20 ml   Filed Weights   11/05/20 0500 11/06/20 0439 11/07/20 0420  Weight: (!) 143 kg (!) 158.8 kg (!) 148.9 kg    Exam: General exam: In no acute distress. Respiratory system: Good air movement and clear to auscultation. Cardiovascular system: S1 & S2 heard, RRR. No JVD. Gastrointestinal system: Abdomen is nondistended, soft and nontender.  Extremities: No pedal edema. Skin: No rashes, lesions or ulcers  Data Reviewed:    Labs: Basic Metabolic Panel: Recent Labs  Lab 11/01/20 0216  11/01/20 0216 11/02/20 4166 11/02/20 0630 11/03/20 0940 11/03/20 0940 11/04/20 0051 11/05/20 0209  NA 137  --  139  --  138  --  135 137  K 3.5   < > 3.2*   < > 3.5   < > 3.3* 4.0  CL 100  --  99  --  99  --  98 98  CO2 26  --  30  --  26  --  26 28  GLUCOSE 192*  --  147*  --  261*  --  184* 106*  BUN 26*  --  18  --  15  --  18 20  CREATININE 1.02*  --  0.91  --  0.96  --  1.10* 0.87  CALCIUM 8.7*  --  8.8*  --  9.0  --  8.8* 9.3  MG 1.3*  --  1.8  --  1.8  --  1.7 1.5*  PHOS 2.4*  --  2.8  --  2.9  --  3.3 3.1   < > = values in this interval not displayed.   GFR Estimated Creatinine Clearance: 70.7 mL/min (by C-G formula based on SCr of 0.87 mg/dL). Liver Function Tests: Recent Labs  Lab 11/05/20 0209  AST 28  ALT 24  ALKPHOS 86  BILITOT 0.8  PROT 5.8*  ALBUMIN 2.8*   No results for input(s): LIPASE, AMYLASE in the last 168 hours. No results for input(s): AMMONIA in the last 168 hours. Coagulation profile No results for input(s): INR, PROTIME in the last 168 hours. COVID-19 Labs  No results for input(s): DDIMER, FERRITIN, LDH, CRP in the last 72 hours.  Lab Results  Component Value Date   SARSCOV2NAA NEGATIVE 10/30/2020   SARSCOV2NAA NEGATIVE 04/05/2020    CBC: Recent Labs  Lab 11/03/20 0940 11/03/20 0940 11/03/20 2057 11/04/20 0051 11/04/20 0635 11/05/20 0209 11/07/20 0323  WBC 9.1  --   --  8.0  --  7.0 7.2  HGB 8.4*   < > 8.2* 8.2* 8.0* 8.2* 8.6*  HCT 27.7*   < > 26.3* 26.4* 25.9* 26.5* 27.4*  MCV 95.5  --   --  92.6  --  94.3 92.9  PLT 347  --   --  278  --  290 277   < > = values in this interval not displayed.   Cardiac Enzymes: No results for input(s): CKTOTAL, CKMB, CKMBINDEX, TROPONINI in the last 168 hours. BNP (last 3 results) No results for input(s): PROBNP in the last 8760 hours. CBG: Recent Labs  Lab 11/06/20 1141 11/06/20 1613 11/06/20 1931 11/06/20 2349 11/07/20 0401  GLUCAP 191* 253* 327* 230* 181*   D-Dimer: No  results for input(s): DDIMER in the last 72 hours. Hgb A1c: No results for input(s): HGBA1C in the last 72 hours. Lipid Profile: No results for input(s): CHOL, HDL, LDLCALC, TRIG, CHOLHDL, LDLDIRECT in the last 72 hours. Thyroid function studies:  No results for input(s): TSH, T4TOTAL, T3FREE, THYROIDAB in the last 72 hours.  Invalid input(s): FREET3 Anemia work up: No results for input(s): VITAMINB12, FOLATE, FERRITIN, TIBC, IRON, RETICCTPCT in the last 72 hours. Sepsis Labs: Recent Labs  Lab 11/03/20 0940 11/04/20 0051 11/05/20 0209 11/07/20 0323  WBC 9.1 8.0 7.0 7.2   Microbiology Recent Results (from the past 240 hour(s))  Respiratory Panel by RT PCR (Flu A&B, Covid) - Nasopharyngeal Swab     Status: None   Collection Time: 10/30/20 10:52 PM   Specimen: Nasopharyngeal Swab; Nasopharyngeal(NP) swabs in vial transport medium  Result Value Ref Range Status   SARS Coronavirus 2 by RT PCR NEGATIVE NEGATIVE Final    Comment: (NOTE) SARS-CoV-2 target nucleic acids are NOT DETECTED.  The SARS-CoV-2 RNA is generally detectable in upper respiratoy specimens during the acute phase of infection. The lowest concentration of SARS-CoV-2 viral copies this assay can detect is 131 copies/mL. A negative result does not preclude SARS-Cov-2 infection and should not be used as the sole basis for treatment or other patient management decisions. A negative result may occur with  improper specimen collection/handling, submission of specimen other than nasopharyngeal swab, presence of viral mutation(s) within the areas targeted by this assay, and inadequate number of viral copies (<131 copies/mL). A negative result must be combined with clinical observations, patient history, and epidemiological information. The expected result is Negative.  Fact Sheet for Patients:  https://www.moore.com/  Fact Sheet for Healthcare Providers:   https://www.young.biz/  This test is no t yet approved or cleared by the [place] FDA and  has been authorized for detection and/or diagnosis of SARS-CoV-2 by FDA under an Emergency Use Authorization (EUA). This EUA will remain  in effect (meaning this test can be used) for the duration of the COVID-19 declaration under Section 564(b)(1) of the Act, 21 U.S.C. section 360bbb-3(b)(1), unless the authorization is terminated or revoked sooner.     Influenza A by PCR NEGATIVE NEGATIVE Final   Influenza B by PCR NEGATIVE NEGATIVE Final    Comment: (NOTE) The Xpert Xpress SARS-CoV-2/FLU/RSV assay is intended as an aid in  the diagnosis of influenza from Nasopharyngeal swab specimens and  should not be used as a sole basis for treatment. Nasal washings and  aspirates are unacceptable for Xpert Xpress SARS-CoV-2/FLU/RSV  testing.  Fact Sheet for Patients: https://www.moore.com/  Fact Sheet for Healthcare Providers: https://www.young.biz/  This test is not yet approved or cleared by the [place] FDA and  has been authorized for detection and/or diagnosis of SARS-CoV-2 by  FDA under an Emergency Use Authorization (EUA). This EUA will remain  in effect (meaning this test can be used) for the duration of the  Covid-19 declaration under Section 564(b)(1) of the Act, 21  U.S.C. section 360bbb-3(b)(1), unless the authorization is  terminated or revoked. Performed at [facility], [address], [place], [place] [address]   Surgical pcr screen     Status: Abnormal   Collection Time: 11/01/20  5:02 AM   Specimen: Nasal Mucosa; Nasal Swab  Result Value Ref Range Status   MRSA, PCR NEGATIVE NEGATIVE Final   Staphylococcus aureus POSITIVE (A) NEGATIVE Final    Comment: (NOTE) The Xpert SA Assay (FDA approved for NASAL specimens in patients 70 years of age and older), is one component of a comprehensive surveillance  program. It is not intended to diagnose infection nor to guide or monitor treatment. Performed at [facility], [address], [place],  [place] [address]      Medications:   . apixaban  5 mg Oral BID  . buPROPion  100 mg Oral Daily  . ferrous sulfate  325 mg Oral Q breakfast  . influenza vaccine adjuvanted  0.5 mL Intramuscular Tomorrow-1000  . insulin aspart  0-9 Units Subcutaneous Q4H  . insulin detemir  10 Units Subcutaneous Daily  . levothyroxine  50 mcg Oral QAC breakfast  . metoprolol succinate  50 mg Oral Daily  . nystatin  5 mL Oral QID  . pantoprazole  40 mg Oral BID AC  . polyethylene glycol  17 g Oral Daily  . pravastatin  80 mg Oral Daily  . ramipril  5 mg Oral Daily  . torsemide  20 mg Oral Daily  . vitamin B-12  1,000 mcg Oral Daily   Continuous Infusions: . lactated ringers Stopped (11/01/20 0903)      LOS: 7 days   Marinda Elk  [facility]  11/07/2020, 9:05 AM

## 2020-11-07 NOTE — Progress Notes (Signed)
Inpatient Diabetes Program Recommendations  AACE/ADA: New Consensus Statement on Inpatient Glycemic Control   Target Ranges:  Prepandial:   less than 140 mg/dL      Peak postprandial:   less than 180 mg/dL (1-2 hours)      Critically ill patients:  140 - 180 mg/dL   Results for RAEONNA, MILO (MRN [id number]) as of 11/07/2020 10:08  Ref. Range 11/06/2020 07:40 11/06/2020 11:41 11/06/2020 16:13 11/06/2020 19:31 11/06/2020 23:49 11/07/2020 04:01  Glucose-Capillary Latest Ref Range: 70 - 99 mg/dL 342 (H) 876 (H) 811 (H) 327 (H) 230 (H) 181 (H)   Review of Glycemic Control  Current orders for Inpatient glycemic control: Levemir 10 units daily, Novolog 0-20 units TID with meals, Novolog 0-5 units QHS, Novolog 6 units TID with meals   Inpatient Diabetes Program Recommendations:    Insulin: Please consider increasing Levemir to 13 units daily.  NURSING: No meal coverage given at all on 11/06/20.  Please administer meal coverage insulin if patient eats at least 50% of meals.   Thanks, Orlando Penner, RN, MSN, CDE Diabetes Coordinator Inpatient Diabetes Program [phone] (Team Pager from 8am to 5pm)

## 2020-11-08 DIAGNOSIS — I509 Heart failure, unspecified: Secondary | ICD-10-CM | POA: Diagnosis not present

## 2020-11-08 DIAGNOSIS — I482 Chronic atrial fibrillation, unspecified: Secondary | ICD-10-CM | POA: Diagnosis not present

## 2020-11-08 DIAGNOSIS — K922 Gastrointestinal hemorrhage, unspecified: Secondary | ICD-10-CM | POA: Diagnosis not present

## 2020-11-08 LAB — CBC
HCT: 28 % — ABNORMAL LOW (ref 36.0–46.0)
Hemoglobin: 8.8 g/dL — ABNORMAL LOW (ref 12.0–15.0)
MCH: 28.8 pg (ref 26.0–34.0)
MCHC: 31.4 g/dL (ref 30.0–36.0)
MCV: 91.5 fL (ref 80.0–100.0)
Platelets: 277 10*3/uL (ref 150–400)
RBC: 3.06 MIL/uL — ABNORMAL LOW (ref 3.87–5.11)
RDW: 15.6 % — ABNORMAL HIGH (ref 11.5–15.5)
WBC: 8.2 10*3/uL (ref 4.0–10.5)
nRBC: 0 % (ref 0.0–0.2)

## 2020-11-08 LAB — GLUCOSE, CAPILLARY
Glucose-Capillary: 92 mg/dL (ref 70–99)
Glucose-Capillary: 104 mg/dL — ABNORMAL HIGH (ref 70–99)
Glucose-Capillary: 218 mg/dL — ABNORMAL HIGH (ref 70–99)

## 2020-11-08 MED ORDER — CIPROFLOXACIN HCL 0.3 % OP SOLN: 2.0000 [drp] | OPHTHALMIC | 0 refills | Status: AC

## 2020-11-08 MED ORDER — POTASSIUM CHLORIDE CRYS ER 20 MEQ PO TBCR: 20.0000 meq | EXTENDED_RELEASE_TABLET | Freq: Two times a day (BID) | ORAL | 0 refills | Status: DC

## 2020-11-08 MED ORDER — POTASSIUM CHLORIDE CRYS ER 20 MEQ PO TBCR
20.0000 meq | EXTENDED_RELEASE_TABLET | Freq: Two times a day (BID) | ORAL | Status: DC
  Administered 2020-11-08: 20 meq via ORAL
  Filled 2020-11-08: qty 1

## 2020-11-08 MED ORDER — TORSEMIDE 20 MG PO TABS
40.0000 mg | ORAL_TABLET | Freq: Two times a day (BID) | ORAL | Status: DC
  Filled 2020-11-08: qty 2

## 2020-11-08 MED ORDER — CIPROFLOXACIN HCL 0.3 % OP SOLN
2.0000 [drp] | OPHTHALMIC | Status: DC
  Administered 2020-11-08: 2 [drp] via OPHTHALMIC
  Filled 2020-11-08 (×2): qty 2.5

## 2020-11-08 MED ORDER — TORSEMIDE 20 MG PO TABS
ORAL_TABLET | ORAL | 0 refills | Status: DC
Start: 2020-11-08 — End: 2020-11-14

## 2020-11-08 NOTE — Progress Notes (Signed)
D/C summary packet provided to son with instructions. Pt's son verbalized understanding of instructions. No complaints voiced. Attempted to call report to [facility], and Per [place] , the nurse would call this writer back.and call back number provided. Pt/son have been informed that report has to be given  First prior to D/C.

## 2020-11-08 NOTE — Progress Notes (Signed)
Received a return call from [place] nurse at [facility], and report given. All questions and concerns were fully answered.

## 2020-11-08 NOTE — Discharge Summary (Signed)
Physician Discharge Summary  Kirsten Jensen KVQ:[id number] DOB: [birth year] DOA: 10/30/2020  PCP: Melida Quitter, MD  Admit date: 10/30/2020 Discharge date: 11/08/2020  Admitted From: ALF Disposition:  ALF  Recommendations for Outpatient Follow-up:  1. Follow up with PCP in 1-2 weeks 2. Check a basic metabolic panel in 1 week sent to PCP. 3. Check a CBC in 1 week and sent to primary care doctor.  Home Health:Yes Equipment/Devices:None  Discharge Condition:stable CODE STATUS:Full Diet recommendation: Heart Healthy   Brief/Interim Summary: 86 y.o. female past medical history significant for chronic atrial fibrillation on Eliquis tachybradycardia syndrome status post pacemaker, essential hypertension, diabetes mellitus type 2, obstructive sleep apnea comes into the hospital due to abnormal labs her hemoglobin dropped from 11-8 she was found to be FOBT positive in the ED, started on IV Protonix drip.  Cardiology was consulted who recommended taking to new metoprolol and proceed with EGD and colonoscopy, GI was consulted the patient underwent EGD and was found to have a nonbleeding ulcer, underwent colonoscopy as that showed diverticulosis and 2 small polyps which were removed.  CT scan of the abdomen and pelvis was unremarkable.  GI did signed off her hemoglobin remained around 1:08 unit of packed red blood cells Eliquis resumed. Discharge Diagnoses:  Active Problems:   Gastrointestinal hemorrhage   Chronic atrial fibrillation (HCC)   Acute congestive heart failure (HCC)  Presumed upper GI bleed: Her hemoglobin dropped from 11-8 FOBT was positive in the ED, Eliquis was held she was started on IV Protonix, GI was consulted and she was transfused 1 unit of packed red blood cells she underwent EGD on 11/01/2020-found a nonbleeding ulcer and plaques consistent with candidiasis.  She was started on Diflucan and she will continue her Protonix twice a day at home, she underwent a colonoscopy she was  found to have internal and external hemorrhoids diverticulosis with 2 small polyps which were removed.  A CT scan of the abdomen pelvis was done which was unremarkable. Her hemoglobin on the day of discharge is trending up she will go home on oral iron.  Permanent atrial fibrillation: Eliquis was held her chads Vascor is greater than 5, her heart rate was controlled on metoprolol. GI recommended to restart Eliquis she was monitored for 24 hours and there were no signs of bleeding.  Acute on chronic diastolic heart failure: She received IV Lasix in the ED, echocardiogram showed an EF of 55%. She will go home on torsemide 40 mg p.o. twice daily for 5 days and potassium supplementation as she is mildly fluid overloaded, will need to check to be made in 5 days to monitor her potassium. He she will resume her torsemide 20 mg daily at home.  Acute kidney injury on chronic kidney disease stage IIIa: With a baseline creatinine of around 1 on admission 1.3 there is resolved with IV fluid hydration.  Elevated troponins: Likely demand ischemia.  CAD, with a history of PCI x2: She will go home off Plavix continue metoprolol. Cardiology recommended to hold Plavix do not resume aspirin continue Eliquis.  Essential hypertension: No changes made to her medication.  Hypothyroidism: Continue Synthroid.  Diabetes mellitus type 2: No change made to her medication.  Severe morbid obesity: Follow-up with PCP.  Left eye conjunctivitis: We'll start her on Cipro ophthalmic solution for 7 days.  Discharge Instructions  Discharge Instructions    Diet - low sodium heart healthy   Complete by: As directed    Diet - low sodium heart healthy  Complete by: As directed    Increase activity slowly   Complete by: As directed    Increase activity slowly   Complete by: As directed      Allergies as of 11/08/2020   No Known Allergies     Medication List    STOP taking these medications    cefdinir 300 MG capsule Commonly known as: OMNICEF   clopidogrel 75 MG tablet Commonly known as: PLAVIX     TAKE these medications   acetaminophen 500 MG tablet Commonly known as: TYLENOL Take 500 mg by mouth 2 (two) times daily as needed for mild pain.   acetaminophen-codeine 300-30 MG tablet Commonly known as: TYLENOL #3 Take 1 tablet by mouth 3 (three) times daily as needed for moderate pain.   acetic acid 0.25 % irrigation Irrigate with 1 application as directed See admin instructions. to wash perineum once a week during bathing   albuterol 108 (90 Base) MCG/ACT inhaler Commonly known as: VENTOLIN HFA Inhale 1 puff into the lungs every 4 (four) hours as needed for wheezing or shortness of breath.   benzonatate 100 MG capsule Commonly known as: TESSALON Take 100 mg by mouth 3 (three) times daily as needed for cough.   buPROPion 100 MG tablet Commonly known as: WELLBUTRIN Take 100 mg by mouth daily.   cholecalciferol 25 MCG (1000 UNIT) tablet Commonly known as: VITAMIN D3 Take 1,000 Units by mouth daily.   ciprofloxacin 0.3 % ophthalmic solution Commonly known as: CILOXAN Place 2 drops into the left eye every 4 (four) hours while awake for 7 days. Administer 1 drop, every 2 hours, while awake, for 2 days. Then 1 drop, every 4 hours, while awake, for the next 5 days.   diltiazem 120 MG 24 hr tablet Commonly known as: CARDIZEM LA Take 120 mg by mouth daily.   Eliquis 5 MG Tabs tablet Generic drug: apixaban Take 5 mg by mouth 2 (two) times daily.   ferrous sulfate 325 (65 FE) MG tablet Take 1 tablet (325 mg total) by mouth daily with breakfast.   fluconazole 100 MG tablet Commonly known as: Diflucan Take 1 tablet (100 mg total) by mouth daily for 5 days.   gemfibrozil 600 MG tablet Commonly known as: LOPID Take 600 mg by mouth 2 (two) times daily before a meal.   glipiZIDE 2.5 MG 24 hr tablet Commonly known as: GLUCOTROL XL Take 2.5 mg by mouth  daily with breakfast.   guaiFENesin 600 MG 12 hr tablet Commonly known as: MUCINEX Take 600 mg by mouth 2 (two) times daily as needed for cough.   ketoconazole 2 % shampoo Commonly known as: NIZORAL Apply 1 application topically 3 (three) times a week. Let sit for 3 minutes before rinsing.   Tuesday Thursday Saturday   levothyroxine 50 MCG tablet Commonly known as: SYNTHROID Take 50 mcg by mouth daily before breakfast.   magnesium oxide 400 MG tablet Commonly known as: MAG-OX Take 1 tablet (400 mg total) by mouth daily.   metFORMIN 500 MG 24 hr tablet Commonly known as: GLUCOPHAGE-XR Take 1,000 mg by mouth 2 (two) times daily.   metoprolol succinate 25 MG 24 hr tablet Commonly known as: Toprol XL Take 1 tablet (25 mg total) by mouth daily.   nystatin cream Commonly known as: MYCOSTATIN Apply 1 application topically 3 (three) times daily.   OCUVITE ADULT 50+ PO Take 1 capsule by mouth daily.   pantoprazole 40 MG tablet Commonly known as: PROTONIX Take 1 tablet (  40 mg total) by mouth 2 (two) times daily before a meal.   potassium chloride SA 20 MEQ tablet Commonly known as: KLOR-CON Take 1 tablet (20 mEq total) by mouth 2 (two) times daily for 5 days.   pravastatin 80 MG tablet Commonly known as: PRAVACHOL Take 80 mg by mouth daily.   Preparation H 0.25-14-74.9 % rectal ointment Generic drug: phenylephrine-shark liver oil-mineral oil-petrolatum Place 1 application rectally 4 (four) times daily as needed (pain and bleeding).   ramipril 5 MG capsule Commonly known as: ALTACE Take 5 mg by mouth daily.   torsemide 20 MG tablet Commonly known as: DEMADEX She will take 40 mg p.o. twice daily for 5 days then she will go back to her home dose of 20 mg daily. What changed:   how much to take  how to take this  when to take this  additional instructions   vitamin B-12 1000 MCG tablet Commonly known as: CYANOCOBALAMIN Take 1,000 mcg by mouth daily.        Follow-up Information    [facility], [facility] Follow up.   Specialty: Home Health Services Why: [facility] will be calling you in the next 24-48 hours to set up home health services. Contact information: [address] [place] [address] [phone]        Melida Quitter, MD. Schedule an appointment as soon as possible for a visit.   Specialty: Internal Medicine Why: Please call and make an appointment with your primary care in the next 7-10 days for a hospital follow up. Contact information: [address] [place] [address] [phone]              No Known Allergies  Consultations:  Gastroenterology  Cardiology   Procedures/Studies: DG Chest 2 View  Result Date: 10/30/2020 CLINICAL DATA:  Cough EXAM: CHEST - 2 VIEW COMPARISON:  April 2021 FINDINGS: Mild interstitial prominence appears chronic. No pleural effusion. Mild cardiomegaly. Left chest wall dual lead pacemaker. No acute osseous abnormality. IMPRESSION: Chronic interstitial changes. Electronically Signed   By: Guadlupe Spanish M.D.   On: 10/30/2020 16:11   CT ABDOMEN PELVIS W CONTRAST  Result Date: 11/04/2020 CLINICAL DATA:  Anemia. Possible upper GI bleed. 14 pound weight gain and chest discomfort. Candidiasis and duodenal ulcers demonstrated at endoscopy. EXAM: CT ABDOMEN AND PELVIS WITH CONTRAST TECHNIQUE: Multidetector CT imaging of the abdomen and pelvis was performed using the standard protocol following bolus administration of intravenous contrast. CONTRAST:  OMNIPAQUE IOHEXOL 300 MG/ML  SOLN COMPARISON:  None. FINDINGS: Lower chest: Small bilateral pleural effusions with basilar atelectasis. Hepatobiliary: No focal liver abnormality is seen. No gallstones, gallbladder wall thickening, or biliary dilatation. Pancreas: Diffuse fatty atrophy of the pancreas. No inflammatory change or focal mass identified. Spleen: Normal in size without focal abnormality. Adrenals/Urinary Tract: Adrenal  glands are unremarkable. Kidneys are normal, without renal calculi, focal lesion, or hydronephrosis. Bladder is unremarkable. Stomach/Bowel: The stomach, small bowel, and colon are not abnormally distended. No wall thickening or inflammatory changes are identified. Diverticulosis of the sigmoid colon. No evidence of diverticulitis. Appendix is not identified. Vascular/Lymphatic: Aortic atherosclerosis. No enlarged abdominal or pelvic lymph nodes. Reproductive: Status post hysterectomy. No adnexal masses. Other: No free air or free fluid in the abdomen. Abdominal wall musculature appears intact. Musculoskeletal: Degenerative changes in the spine. No destructive bone lesions. IMPRESSION: 1. No acute process demonstrated in the abdomen or pelvis. No evidence of bowel obstruction or inflammation. 2. Small bilateral pleural  effusions with basilar atelectasis. 3. Diffuse fatty atrophy of the pancreas. 4. Aortic atherosclerosis. Aortic Atherosclerosis (ICD10-I70.0). Electronically Signed   By: Burman Nieves M.D.   On: 11/04/2020 01:12   ECHOCARDIOGRAM COMPLETE  Result Date: 10/31/2020    ECHOCARDIOGRAM REPORT   Patient Name:   Kirsten Jensen Date of Exam: 10/31/2020 Medical Rec #:  [id number]    Height:       67.0 in Accession #:    [id number]   Weight:       318.8 lb Date of Birth:  Jul 23, 1932     BSA:          2.465 m Patient Age:    86 years     BP:           110/76 mmHg Patient Gender: F            HR:           96 bpm. Exam Location:  Inpatient Procedure: 2D Echo Indications:    acute diastolic chf 428.31  History:        Patient has prior history of Echocardiogram examinations, most                 recent 04/06/2020. Pacemaker, Arrythmias:Atrial Fibrillation and                 Atrial Flutter; Risk Factors:Hypertension and Diabetes.  Sonographer:    Delcie Roch Referring Phys: [id number] CAROLE N HALL  Sonographer Comments: Patient is morbidly obese. Image acquisition challenging due to patient body habitus.  IMPRESSIONS  1. Left ventricular ejection fraction, by estimation, is 55 to 60%. The left ventricle has normal function. The left ventricle has no regional wall motion abnormalities. There is mild concentric left ventricular hypertrophy. Left ventricular diastolic function could not be evaluated.  2. Right ventricular systolic function is normal. The right ventricular size is normal. There is moderately elevated pulmonary artery systolic pressure. The estimated right ventricular systolic pressure is 58.8 mmHg.  3. The mitral valve is degenerative. Mild mitral valve regurgitation.  4. Tricuspid valve regurgitation is mild to moderate.  5. The aortic valve is tricuspid. There is mild calcification of the aortic valve. There is mild thickening of the aortic valve. Aortic valve regurgitation is not visualized. Mild aortic valve sclerosis is present, with no evidence of aortic valve stenosis.  6. The inferior vena cava is dilated in size with <50% respiratory variability, suggesting right atrial pressure of 15 mmHg. Comparison(s): Changes from prior study are noted. EF 55-60%. Moderately elevated pulmonary pressures. FINDINGS  Left Ventricle: Left ventricular ejection fraction, by estimation, is 55 to 60%. The left ventricle has normal function. The left ventricle has no regional wall motion abnormalities. The left ventricular internal cavity size was normal in size. There is  mild concentric left ventricular hypertrophy. Left ventricular diastolic function could not be evaluated due to atrial fibrillation. Left ventricular diastolic function could not be evaluated. Right Ventricle: The right ventricular size is normal. No increase in right ventricular wall thickness. Right ventricular systolic function is normal. There is moderately elevated pulmonary artery systolic pressure. The tricuspid regurgitant velocity is 3.31 m/s, and with an assumed right atrial pressure of 15 mmHg, the estimated right ventricular systolic  pressure is 58.8 mmHg. Left Atrium: Left atrial size was normal in size. Right Atrium: Right atrial size was normal in size. Pericardium: Trivial pericardial effusion is present. Mitral Valve: The mitral valve is degenerative in appearance. Mild to moderate mitral annular calcification. Mild  mitral valve regurgitation. Tricuspid Valve: The tricuspid valve is grossly normal. Tricuspid valve regurgitation is mild to moderate. No evidence of tricuspid stenosis. Aortic Valve: The aortic valve is tricuspid. There is mild calcification of the aortic valve. There is mild thickening of the aortic valve. Aortic valve regurgitation is not visualized. Mild aortic valve sclerosis is present, with no evidence of aortic valve stenosis. Pulmonic Valve: The pulmonic valve was grossly normal. Pulmonic valve regurgitation is not visualized. No evidence of pulmonic stenosis. Aorta: The aortic root and ascending aorta are structurally normal, with no evidence of dilitation. Venous: The inferior vena cava is dilated in size with less than 50% respiratory variability, suggesting right atrial pressure of 15 mmHg. IAS/Shunts: The atrial septum is grossly normal. Additional Comments: A pacer wire is visualized in the right atrium and right ventricle.  LEFT VENTRICLE PLAX 2D LVIDd:         4.70 cm LVIDs:         4.10 cm LV PW:         1.30 cm LV IVS:        1.20 cm  IVC IVC diam: 2.60 cm LEFT ATRIUM             Index       RIGHT ATRIUM           Index LA diam:        4.30 cm 1.74 cm/m  RA Area:     16.20 cm LA Vol (A2C):   72.4 ml 29.37 ml/m RA Volume:   41.40 ml  16.79 ml/m LA Vol (A4C):   55.5 ml 22.51 ml/m LA Biplane Vol: 65.3 ml 26.49 ml/m   AORTA Ao Asc diam: 3.40 cm TRICUSPID VALVE TR Peak grad:   43.8 mmHg TR Vmax:        331.00 cm/s Lennie Odor MD Electronically signed by Lennie Odor MD Signature Date/Time: 10/31/2020/12:35:52 PM    Final      Subjective: No new complaints.  Discharge Exam: Vitals:   11/08/20 0300  11/08/20 0725  BP: 130/63 137/65  Pulse: 70 71  Resp: 20 17  Temp: 98.1 F (36.7 C) 97.8 F (36.6 C)  SpO2: 95% 98%   Vitals:   11/07/20 1913 11/08/20 0300 11/08/20 0500 11/08/20 0725  BP: 124/66 130/63  137/65  Pulse: 71 70  71  Resp: 18 20  17   Temp: 98.5 F (36.9 C) 98.1 F (36.7 C)  97.8 F (36.6 C)  TempSrc: Oral Oral  Oral  SpO2: 94% 95%  98%  Weight:   (!) 147.9 kg   Height:        General: Pt is alert, awake, not in acute distress Cardiovascular: RRR, S1/S2 +, no rubs, no gallops Respiratory: CTA bilaterally, no wheezing, no rhonchi Abdominal: Soft, NT, ND, bowel sounds + Extremities: no edema, no cyanosis    The results of significant diagnostics from this hospitalization (including imaging, microbiology, ancillary and laboratory) are listed below for reference.     Microbiology: Recent Results (from the past 240 hour(s))  Respiratory Panel by RT PCR (Flu A&B, Covid) - Nasopharyngeal Swab     Status: None   Collection Time: 10/30/20 10:52 PM   Specimen: Nasopharyngeal Swab; Nasopharyngeal(NP) swabs in vial transport medium  Result Value Ref Range Status   SARS Coronavirus 2 by RT PCR NEGATIVE NEGATIVE Final    Comment: (NOTE) SARS-CoV-2 target nucleic acids are NOT DETECTED.  The SARS-CoV-2 RNA is generally detectable in upper respiratoy specimens  during the acute phase of infection. The lowest concentration of SARS-CoV-2 viral copies this assay can detect is 131 copies/mL. A negative result does not preclude SARS-Cov-2 infection and should not be used as the sole basis for treatment or other patient management decisions. A negative result may occur with  improper specimen collection/handling, submission of specimen other than nasopharyngeal swab, presence of viral mutation(s) within the areas targeted by this assay, and inadequate number of viral copies (<131 copies/mL). A negative result must be combined with clinical observations, patient history,  and epidemiological information. The expected result is Negative.  Fact Sheet for Patients:  https://www.moore.com/  Fact Sheet for Healthcare Providers:  https://www.young.biz/  This test is no t yet approved or cleared by the [place] FDA and  has been authorized for detection and/or diagnosis of SARS-CoV-2 by FDA under an Emergency Use Authorization (EUA). This EUA will remain  in effect (meaning this test can be used) for the duration of the COVID-19 declaration under Section 564(b)(1) of the Act, 21 U.S.C. section 360bbb-3(b)(1), unless the authorization is terminated or revoked sooner.     Influenza A by PCR NEGATIVE NEGATIVE Final   Influenza B by PCR NEGATIVE NEGATIVE Final    Comment: (NOTE) The Xpert Xpress SARS-CoV-2/FLU/RSV assay is intended as an aid in  the diagnosis of influenza from Nasopharyngeal swab specimens and  should not be used as a sole basis for treatment. Nasal washings and  aspirates are unacceptable for Xpert Xpress SARS-CoV-2/FLU/RSV  testing.  Fact Sheet for Patients: https://www.moore.com/  Fact Sheet for Healthcare Providers: https://www.young.biz/  This test is not yet approved or cleared by the [place] FDA and  has been authorized for detection and/or diagnosis of SARS-CoV-2 by  FDA under an Emergency Use Authorization (EUA). This EUA will remain  in effect (meaning this test can be used) for the duration of the  Covid-19 declaration under Section 564(b)(1) of the Act, 21  U.S.C. section 360bbb-3(b)(1), unless the authorization is  terminated or revoked. Performed at [facility], [address] [place], [place] [address]   Surgical pcr screen     Status: Abnormal   Collection Time: 11/01/20  5:02 AM   Specimen: Nasal Mucosa; Nasal Swab  Result Value Ref Range Status   MRSA, PCR NEGATIVE NEGATIVE Final   Staphylococcus aureus POSITIVE (A)  NEGATIVE Final    Comment: (NOTE) The Xpert SA Assay (FDA approved for NASAL specimens in patients 24 years of age and older), is one component of a comprehensive surveillance program. It is not intended to diagnose infection nor to guide or monitor treatment. Performed at [facility], [address] [place], [place] [address]      Labs: BNP (last 3 results) Recent Labs    10/30/20 1505  BNP 628.9*   Basic Metabolic Panel: Recent Labs  Lab 11/02/20 0619 11/03/20 0940 11/04/20 0051 11/05/20 0209  NA 139 138 135 137  K 3.2* 3.5 3.3* 4.0  CL 99 99 98 98  CO2 30 26 26 28   GLUCOSE 147* 261* 184* 106*  BUN 18 15 18 20   CREATININE 0.91 0.96 1.10* 0.87  CALCIUM 8.8* 9.0 8.8* 9.3  MG 1.8 1.8 1.7 1.5*  PHOS 2.8 2.9 3.3 3.1   Liver Function Tests: Recent Labs  Lab 11/05/20 0209  AST 28  ALT 24  ALKPHOS 86  BILITOT 0.8  PROT 5.8*  ALBUMIN 2.8*   No results for input(s): LIPASE, AMYLASE in the last 168 hours.  No results for input(s): AMMONIA in the last 168 hours. CBC: Recent Labs  Lab 11/03/20 0940 11/03/20 2057 11/04/20 0051 11/04/20 0635 11/05/20 0209 11/07/20 0323 11/08/20 0204  WBC 9.1  --  8.0  --  7.0 7.2 8.2  HGB 8.4*   < > 8.2* 8.0* 8.2* 8.6* 8.8*  HCT 27.7*   < > 26.4* 25.9* 26.5* 27.4* 28.0*  MCV 95.5  --  92.6  --  94.3 92.9 91.5  PLT 347  --  278  --  290 277 277   < > = values in this interval not displayed.   Cardiac Enzymes: No results for input(s): CKTOTAL, CKMB, CKMBINDEX, TROPONINI in the last 168 hours. BNP: Invalid input(s): POCBNP CBG: Recent Labs  Lab 11/07/20 1608 11/07/20 1937 11/07/20 2327 11/08/20 0336 11/08/20 0824  GLUCAP 162* 258* 84 92 104*   D-Dimer No results for input(s): DDIMER in the last 72 hours. Hgb A1c No results for input(s): HGBA1C in the last 72 hours. Lipid Profile No results for input(s): CHOL, HDL, LDLCALC, TRIG, CHOLHDL, LDLDIRECT in the last 72 hours. Thyroid function studies No results  for input(s): TSH, T4TOTAL, T3FREE, THYROIDAB in the last 72 hours.  Invalid input(s): FREET3 Anemia work up No results for input(s): VITAMINB12, FOLATE, FERRITIN, TIBC, IRON, RETICCTPCT in the last 72 hours. Urinalysis No results found for: COLORURINE, APPEARANCEUR, LABSPEC, PHURINE, GLUCOSEU, HGBUR, BILIRUBINUR, KETONESUR, PROTEINUR, UROBILINOGEN, NITRITE, LEUKOCYTESUR Sepsis Labs Invalid input(s): PROCALCITONIN,  WBC,  LACTICIDVEN Microbiology Recent Results (from the past 240 hour(s))  Respiratory Panel by RT PCR (Flu A&B, Covid) - Nasopharyngeal Swab     Status: None   Collection Time: 10/30/20 10:52 PM   Specimen: Nasopharyngeal Swab; Nasopharyngeal(NP) swabs in vial transport medium  Result Value Ref Range Status   SARS Coronavirus 2 by RT PCR NEGATIVE NEGATIVE Final    Comment: (NOTE) SARS-CoV-2 target nucleic acids are NOT DETECTED.  The SARS-CoV-2 RNA is generally detectable in upper respiratoy specimens during the acute phase of infection. The lowest concentration of SARS-CoV-2 viral copies this assay can detect is 131 copies/mL. A negative result does not preclude SARS-Cov-2 infection and should not be used as the sole basis for treatment or other patient management decisions. A negative result may occur with  improper specimen collection/handling, submission of specimen other than nasopharyngeal swab, presence of viral mutation(s) within the areas targeted by this assay, and inadequate number of viral copies (<131 copies/mL). A negative result must be combined with clinical observations, patient history, and epidemiological information. The expected result is Negative.  Fact Sheet for Patients:  https://www.moore.com/  Fact Sheet for Healthcare Providers:  https://www.young.biz/  This test is no t yet approved or cleared by the [place] FDA and  has been authorized for detection and/or diagnosis of SARS-CoV-2 by FDA under  an Emergency Use Authorization (EUA). This EUA will remain  in effect (meaning this test can be used) for the duration of the COVID-19 declaration under Section 564(b)(1) of the Act, 21 U.S.C. section 360bbb-3(b)(1), unless the authorization is terminated or revoked sooner.     Influenza A by PCR NEGATIVE NEGATIVE Final   Influenza B by PCR NEGATIVE NEGATIVE Final    Comment: (NOTE) The Xpert Xpress SARS-CoV-2/FLU/RSV assay is intended as an aid in  the diagnosis of influenza from Nasopharyngeal swab specimens and  should not be used as a sole basis for treatment. Nasal washings and  aspirates are unacceptable for Xpert Xpress SARS-CoV-2/FLU/RSV  testing.  Fact Sheet for Patients: https://www.moore.com/  Fact Sheet for Healthcare Providers: https://www.young.biz/https://www.fda.gov/media/142435/download  This test is not yet approved or cleared by the [place] FDA and  has been authorized for detection and/or diagnosis of SARS-CoV-2 by  FDA under an Emergency Use Authorization (EUA). This EUA will remain  in effect (meaning this test can be used) for the duration of the  Covid-19 declaration under Section 564(b)(1) of the Act, 21  U.S.C. section 360bbb-3(b)(1), unless the authorization is  terminated or revoked. Performed at [facility], [address] [place], [place] [address]   Surgical pcr screen     Status: Abnormal   Collection Time: 11/01/20  5:02 AM   Specimen: Nasal Mucosa; Nasal Swab  Result Value Ref Range Status   MRSA, PCR NEGATIVE NEGATIVE Final   Staphylococcus aureus POSITIVE (A) NEGATIVE Final    Comment: (NOTE) The Xpert SA Assay (FDA approved for NASAL specimens in patients 84 years of age and older), is one component of a comprehensive surveillance program. It is not intended to diagnose infection nor to guide or monitor treatment. Performed at [facility], [address] [place], [place] [address]      Time coordinating  discharge: Over 30 minutes  SIGNED:   Marinda ElkAbraham Feliz Ortiz, MD  [facility] 11/08/2020, 10:02 AM Pager   If 7PM-7AM, please contact night-coverage [email or web address] Password [email or web address]

## 2020-11-08 NOTE — TOC Transition Note (Signed)
Transition of Care [facility]) - CM/SW Discharge Note   Patient Details  Name: Kirsten Jensen MRN: [id number] Date of Birth: November 25, 1932  Transition of Care [facility]) CM/SW Contact:  Levada Schilling Phone Number: 11/08/2020, 2:36 PM   Clinical Narrative:    Patient will Discharge To: [facility] Anticipated DC Date: 11/08/20 Family Notified:yes, Meggin Ola, son [phone] Transport By: Shari Heritage to ALF   Per MD patient ready for DC to [facility] . RN, patient, patient's family, and facility notified of DC. Assessment, Fl2/Pasrr, and Discharge Summary sent to facility. RN given number for report [phone]). DC packet on chart.  Patient's son will transport to [facility]..   CSW signing off.  Budd Palmer LCSWA [phone]     Final next level of care: Assisted Living Barriers to Discharge: No Barriers Identified   Patient Goals and CMS Choice Patient states their goals for this hospitalization and ongoing recovery are:: Patient is planning to discharge home to the assisted living. CMS Medicare.gov Compare Post Acute Care list provided to:: Patient Choice offered to / list presented to : Patient  Discharge Placement              Patient chooses bed at:  [facility]) Patient to be transferred to facility by: Son Name of family member notified: Ronnisha Felber Patient and family notified of of transfer: 11/08/20  Discharge Plan and Services   Discharge Planning Services: CM Consult Post Acute Care Choice: Home Health (patient declined WC)          DME Arranged:  (patient declined WC)         HH Arranged: PT HH Agency: [facility] (now [facility] Date HH Agency Contacted: 11/07/20 Time HH Agency Contacted: [phone] Representative spoke with at [facility] Agency: Mellissa Kohut, [facility]  Social Determinants of Health (SDOH) Interventions     Readmission Risk Interventions No flowsheet data found.

## 2020-11-08 NOTE — Progress Notes (Signed)
Leave a voicemail message to [facility] , still awaiting a return call from staff nurse. Pt/son informed this Clinical research associate that they will leave now to [facility] and will inform the nurse to call me for report. Wheeled pt downstairs accompanied by son. Son is responsible for pt's transport to [facility]. Pt remains alert/oriented in no apparent distress. No complaints voiced.

## 2020-11-13 ENCOUNTER — Encounter: Payer: Medicare Other | Admitting: Student

## 2020-11-13 NOTE — Progress Notes (Deleted)
Electrophysiology Office Note Date: 11/13/2020  ID:  Riannon Mukherjee, DOB [birth year], MRN [id number]  PCP: Melida Quitter, MD Primary Cardiologist: Sherryl Manges, MD Electrophysiologist: Sherryl Manges, MD   CC: Pacemaker follow-up  Kirsten Jensen is a 86 y.o. female seen today for Sherryl Manges, MD for routine electrophysiology followup.  Since last being seen in our clinic the patient reports doing ***.  she denies chest pain, palpitations, dyspnea, PND, orthopnea, nausea, vomiting, dizziness, syncope, edema, weight gain, or early satiety.  Device History: BSCi dual chamber PPM implanted 09/14/2018  Past Medical History:  Diagnosis Date  . Atrial fibrillation (HCC)   . Coronary artery disease    stenting x 2  . Diabetes (HCC)   . Dyslipidemia 04/05/2020  . Hypertension   . Morbid obesity (HCC)   . Pacemaker   . Paroxysmal atrial fibrillation (HCC) 11/06/2019  . Secondary hypercoagulable state (HCC) 11/06/2019  . T2DM (type 2 diabetes mellitus) (HCC) 04/05/2020  . Tachycardia-bradycardia syndrome (HCC) 03/17/2020   Past Surgical History:  Procedure Laterality Date  . COLONOSCOPY N/A 11/02/2020   Procedure: COLONOSCOPY;  Surgeon: Vida Rigger, MD;  Location: [facility] ENDOSCOPY;  Service: Endoscopy;  Laterality: N/A;  . ESOPHAGOGASTRODUODENOSCOPY N/A 11/01/2020   Procedure: ESOPHAGOGASTRODUODENOSCOPY (EGD);  Surgeon: Vida Rigger, MD;  Location: [facility] ENDOSCOPY;  Service: Endoscopy;  Laterality: N/A;    Current Outpatient Medications  Medication Sig Dispense Refill  . acetaminophen (TYLENOL) 500 MG tablet Take 500 mg by mouth 2 (two) times daily as needed for mild pain.     Marland Kitchen acetaminophen-codeine (TYLENOL #3) 300-30 MG tablet Take 1 tablet by mouth 3 (three) times daily as needed for moderate pain.    Marland Kitchen acetic acid 0.25 % irrigation Irrigate with 1 application as directed See admin instructions. to wash perineum once a week during bathing    . albuterol (VENTOLIN HFA) 108 (90 Base) MCG/ACT  inhaler Inhale 1 puff into the lungs every 4 (four) hours as needed for wheezing or shortness of breath.    Marland Kitchen apixaban (ELIQUIS) 5 MG TABS tablet Take 5 mg by mouth 2 (two) times daily.    . benzonatate (TESSALON) 100 MG capsule Take 100 mg by mouth 3 (three) times daily as needed for cough.     Marland Kitchen buPROPion (WELLBUTRIN) 100 MG tablet Take 100 mg by mouth daily.    . cholecalciferol (VITAMIN D3) 25 MCG (1000 UNIT) tablet Take 1,000 Units by mouth daily.    . ciprofloxacin (CILOXAN) 0.3 % ophthalmic solution Place 2 drops into the left eye every 4 (four) hours while awake for 7 days. Administer 1 drop, every 2 hours, while awake, for 2 days. Then 1 drop, every 4 hours, while awake, for the next 5 days. 5 mL 0  . diltiazem (CARDIZEM LA) 120 MG 24 hr tablet Take 120 mg by mouth daily.    . ferrous sulfate 325 (65 FE) MG tablet Take 1 tablet (325 mg total) by mouth daily with breakfast. 30 tablet 3  . gemfibrozil (LOPID) 600 MG tablet Take 600 mg by mouth 2 (two) times daily before a meal.    . glipiZIDE (GLUCOTROL XL) 2.5 MG 24 hr tablet Take 2.5 mg by mouth daily with breakfast.    . guaiFENesin (MUCINEX) 600 MG 12 hr tablet Take 600 mg by mouth 2 (two) times daily as needed for cough.    Marland Kitchen ketoconazole (NIZORAL) 2 % shampoo Apply 1 application topically 3 (three) times a week. Let sit for 3 minutes before  rinsing.   Tuesday Thursday Saturday    . levothyroxine (SYNTHROID) 50 MCG tablet Take 50 mcg by mouth daily before breakfast.    . magnesium oxide (MAG-OX) 400 MG tablet Take 1 tablet (400 mg total) by mouth daily. 30 tablet 3  . metFORMIN (GLUCOPHAGE-XR) 500 MG 24 hr tablet Take 1,000 mg by mouth 2 (two) times daily.    . metoprolol succinate (TOPROL XL) 25 MG 24 hr tablet Take 1 tablet (25 mg total) by mouth daily. 30 tablet 3  . Multiple Vitamins-Minerals (OCUVITE ADULT 50+ PO) Take 1 capsule by mouth daily.    Marland Kitchen nystatin cream (MYCOSTATIN) Apply 1 application topically 3 (three) times daily.     . pantoprazole (PROTONIX) 40 MG tablet Take 1 tablet (40 mg total) by mouth 2 (two) times daily before a meal. 30 tablet 3  . phenylephrine-shark liver oil-mineral oil-petrolatum (PREPARATION H) 0.25-14-74.9 % rectal ointment Place 1 application rectally 4 (four) times daily as needed (pain and bleeding).    . potassium chloride SA (KLOR-CON) 20 MEQ tablet Take 1 tablet (20 mEq total) by mouth 2 (two) times daily for 5 days. 10 tablet 0  . pravastatin (PRAVACHOL) 80 MG tablet Take 80 mg by mouth daily.    . ramipril (ALTACE) 5 MG capsule Take 5 mg by mouth daily.    Marland Kitchen torsemide (DEMADEX) 20 MG tablet She will take 40 mg p.o. twice daily for 5 days then she will go back to her home dose of 20 mg daily. 28 tablet 0  . vitamin B-12 (CYANOCOBALAMIN) 1000 MCG tablet Take 1,000 mcg by mouth daily.     No current facility-administered medications for this visit.    Allergies:   Patient has no known allergies.   Social History: Social History   Socioeconomic History  . Marital status: Widowed    Spouse name: Not on file  . Number of children: Not on file  . Years of education: Not on file  . Highest education level: Not on file  Occupational History  . Not on file  Tobacco Use  . Smoking status: Never Smoker  . Smokeless tobacco: Never Used  Vaping Use  . Vaping Use: Never used  Substance and Sexual Activity  . Alcohol use: Not Currently  . Drug use: Never  . Sexual activity: Not on file  Other Topics Concern  . Not on file  Social History Narrative  . Not on file   Social Determinants of Health   Financial Resource Strain:   . Difficulty of Paying Living Expenses: Not on file  Food Insecurity:   . Worried About Programme researcher, broadcasting/film/video in the Last Year: Not on file  . Ran Out of Food in the Last Year: Not on file  Transportation Needs:   . Lack of Transportation (Medical): Not on file  . Lack of Transportation (Non-Medical): Not on file  Physical Activity:   . Days of Exercise  per Week: Not on file  . Minutes of Exercise per Session: Not on file  Stress:   . Feeling of Stress : Not on file  Social Connections:   . Frequency of Communication with Friends and Family: Not on file  . Frequency of Social Gatherings with Friends and Family: Not on file  . Attends Religious Services: Not on file  . Active Member of Clubs or Organizations: Not on file  . Attends Banker Meetings: Not on file  . Marital Status: Not on file  Intimate Partner  Violence:   . Fear of Current or Ex-Partner: Not on file  . Emotionally Abused: Not on file  . Physically Abused: Not on file  . Sexually Abused: Not on file    Family History: Family History  Problem Relation Age of Onset  . Hypertension Mother   . Stroke Mother        died at age 23  . CAD Father        MI at age of 32     Review of Systems: All other systems reviewed and are otherwise negative except as noted above.  Physical Exam: There were no vitals filed for this visit.   GEN- The patient is well appearing, alert and oriented x 3 today.   HEENT: normocephalic, atraumatic; sclera clear, conjunctiva pink; hearing intact; oropharynx clear; neck supple  Lungs- Clear to ausculation bilaterally, normal work of breathing.  No wheezes, rales, rhonchi Heart- Regular rate and rhythm, no murmurs, rubs or gallops  GI- soft, non-tender, non-distended, bowel sounds present  Extremities- no clubbing or cyanosis. No edema MS- no significant deformity or atrophy Skin- warm and dry, no rash or lesion; PPM pocket well healed Psych- euthymic mood, full affect Neuro- strength and sensation are intact  PPM Interrogation- reviewed in detail today,  See PACEART report  EKG:  EKG {ACTION; IS/IS RFX:[id number]} ordered today. The ekg ordered today shows ***  Recent Labs: 10/30/2020: B Natriuretic Peptide 628.9 10/31/2020: TSH 1.050 11/05/2020: ALT 24; BUN 20; Creatinine, Ser 0.87; Magnesium 1.5; Potassium 4.0;  Sodium 137 11/08/2020: Hemoglobin 8.8; Platelets 277   Wt Readings from Last 3 Encounters:  11/08/20 (!) 326 lb 1 oz (147.9 kg)  10/30/20 (!) 314 lb (142.4 kg)  10/23/20 (!) 314 lb 9.6 oz (142.7 kg)     Other studies Reviewed: Additional studies/ records that were reviewed today include: Previous EP office notes, Previous remote checks, Most recent labwork.   Assessment and Plan:  1. Sick sinus syndrome s/p [employer] PPM  Normal PPM function See Arita Miss Art report No changes today  2. Permanent atrial fib/flutter She is on Eliquis for CHA2DS2VASC of at least 7.   She had ERAF after 10 days of a recent [facility] and did not note improvement in NSR, and has been stable symptomatic. We have opted for rate control in shared decision making.  Continue toprol 25 mg daily Continue diltiazem 120 mg daily  3. CAD Continue plavix, statin per PCP Atypical CP in past, not recurrent  4. HTN Continue current medications  5. Chronic diastolic CHF Echo 10/31/20 showed LVEF 55-60% Recent CXR with no acute process  6. Anemia S/p recent transfusion  Current medicines are reviewed at length with the patient today.   The patient does not have concerns regarding her medicines.  The following changes were made today:  {NONE DEFAULTED:18576::"none"}  Labs/ tests ordered today include: *** No orders of the defined types were placed in this encounter.    Disposition:   Follow up with [name] in 6 Months    Signed, Luane School  11/13/2020 9:14 AM  [facility] [address] [place] [address] [phone] (office) [phone] (fax)

## 2020-11-14 ENCOUNTER — Other Ambulatory Visit: Payer: Self-pay

## 2020-11-14 ENCOUNTER — Ambulatory Visit (INDEPENDENT_AMBULATORY_CARE_PROVIDER_SITE_OTHER): Payer: Medicare Other | Admitting: Internal Medicine

## 2020-11-14 ENCOUNTER — Encounter: Payer: Self-pay | Admitting: Internal Medicine

## 2020-11-14 ENCOUNTER — Telehealth: Payer: Self-pay | Admitting: Internal Medicine

## 2020-11-14 VITALS — BP 124/62 | HR 111 | Ht 67.0 in | Wt 305.4 lb

## 2020-11-14 DIAGNOSIS — I2510 Atherosclerotic heart disease of native coronary artery without angina pectoris: Secondary | ICD-10-CM | POA: Diagnosis not present

## 2020-11-14 DIAGNOSIS — I480 Paroxysmal atrial fibrillation: Secondary | ICD-10-CM | POA: Diagnosis not present

## 2020-11-14 MED ORDER — TORSEMIDE 20 MG PO TABS: 60.0000 mg | ORAL_TABLET | Freq: Every day | ORAL | 0 refills | Status: DC

## 2020-11-14 NOTE — Patient Instructions (Addendum)
Medication Instructions:  Your physician has recommended you make the following change in your medication:   Begin taking Toresmide 60mg  - 3 (20mg ) tablets by mouth every morning by mouth.  *If you need a refill on your cardiac medications before your next appointment, please call your pharmacy*   Lab Work: None ordered.  If you have labs (blood work) drawn today and your tests are completely normal, you will receive your results only by: MyChart Message (if you have MyChart) OR . A paper copy in the mail If you have any lab test that is abnormal or we need to change your treatment, we will call you to review the results.   Testing/Procedures: None ordered.    Follow-Up: At [facility], you and your health needs are our priority.  As part of our continuing mission to provide you with exceptional heart care, we have created designated Provider Care Teams.  These Care Teams include your primary Cardiologist (physician) and Advanced Practice Providers (APPs -  Physician Assistants and Nurse Practitioners) who all work together to provide you with the care you need, when you need it.  We recommend signing up for the patient portal called "MyChart".  Sign up information is provided on this After Visit Summary.  MyChart is used to connect with patients for Virtual Visits (Telemedicine).  Patients are able to view lab/test results, encounter notes, upcoming appointments, etc.  Non-urgent messages can be sent to your provider as well.   To learn more about what you can do with MyChart, go to Marland Kitchen.    Your next appointment:   Follow up with APP in 2-3 weeks and then 6 months with Dr [facility].

## 2020-11-14 NOTE — Telephone Encounter (Signed)
Patient states [name] wanted to know her Hemoglobin. She states it is 9.1

## 2020-11-14 NOTE — Telephone Encounter (Signed)
Dr Graciela Husbands advised of pt's Hgb of 9.1 as reported by pt.

## 2020-11-14 NOTE — Progress Notes (Signed)
Oh back to that lady is you right this is a 300 pound lady is that her hemoglobin is 9 point     Patient Care Team: Melida Quitter, MD as PCP - General (Internal Medicine) Duke Salvia, MD as PCP - Cardiology (Cardiology) Duke Salvia, MD as PCP - Electrophysiology (Cardiology)   HPI  Kirsten Jensen is a 86 y.o. female seen in follow-up for a Dual chamber [employer] pacemaker implanted 2019 for what was suspected tachybradycardia syndrome.  She has a history of coronary artery disease.  She was stented about 15 years ago  And most recently 2018 or so.    She has been managed currently with a combination of clopidogrel and Eliquis.  No significant    bleeding    A. fib clinic following device detection of rapid ventricular response.  Amlodipine was switched to diltiazem. With improvement in rate control.     Intercurrent hospitalization for upper GI bleed.  Hemoglobin dropped 11--8.  EGD demonstrated an ulcer and candidiasis.  Colonoscopy demonstrated hemorrhoids and diverticulosis with polyps.  She was transfused.  During the hospitalization she was noted to be in atrial flutter.  Reviewing electrocardiograms back to 2020-November, sinus rhythm was last appreciated 12/20 and every subsequent ECG has demonstrated atrial flutter  Significant volume overload.  Has been edematous she thinks about 3-4 months.  [facility].  Pads during the day and depends at night.  Does not really note significant diuresis with her torsemide.  DATE TEST EF   4/21 Echo   50 %   11/21 Echo   55-60 %           Date Cr K Hgb  2/21 1.10 4.8 10.9 (10/20)  11/21  0.87 4.0 8.8 >> 9.1      Past Medical History:  Diagnosis Date  . Atrial fibrillation (HCC)   . Coronary artery disease    stenting x 2  . Diabetes (HCC)   . Dyslipidemia 04/05/2020  . Hypertension   . Morbid obesity (HCC)   . Pacemaker   . Paroxysmal atrial fibrillation (HCC) 11/06/2019  . Secondary  hypercoagulable state (HCC) 11/06/2019  . T2DM (type 2 diabetes mellitus) (HCC) 04/05/2020  . Tachycardia-bradycardia syndrome (HCC) 03/17/2020    Past Surgical History:  Procedure Laterality Date  . COLONOSCOPY N/A 11/02/2020   Procedure: COLONOSCOPY;  Surgeon: Vida Rigger, MD;  Location: [facility] ENDOSCOPY;  Service: Endoscopy;  Laterality: N/A;  . ESOPHAGOGASTRODUODENOSCOPY N/A 11/01/2020   Procedure: ESOPHAGOGASTRODUODENOSCOPY (EGD);  Surgeon: Vida Rigger, MD;  Location: [facility] ENDOSCOPY;  Service: Endoscopy;  Laterality: N/A;    Current Meds  Medication Sig  . acetaminophen (TYLENOL) 500 MG tablet Take 500 mg by mouth 2 (two) times daily as needed for mild pain.   Marland Kitchen acetaminophen-codeine (TYLENOL #3) 300-30 MG tablet Take 1 tablet by mouth 3 (three) times daily as needed for moderate pain.  Marland Kitchen acetic acid 0.25 % irrigation Irrigate with 1 application as directed See admin instructions. to wash perineum once a week during bathing  . albuterol (VENTOLIN HFA) 108 (90 Base) MCG/ACT inhaler Inhale 1 puff into the lungs every 4 (four) hours as needed for wheezing or shortness of breath.  Marland Kitchen apixaban (ELIQUIS) 5 MG TABS tablet Take 5 mg by mouth 2 (two) times daily.  . benzonatate (TESSALON) 100 MG capsule Take 100 mg by mouth 3 (three) times daily as needed for cough.   Marland Kitchen buPROPion (WELLBUTRIN) 100 MG tablet Take 100 mg by mouth  daily.  . cholecalciferol (VITAMIN D3) 25 MCG (1000 UNIT) tablet Take 1,000 Units by mouth daily.  . ciprofloxacin (CILOXAN) 0.3 % ophthalmic solution Place 2 drops into the left eye every 4 (four) hours while awake for 7 days. Administer 1 drop, every 2 hours, while awake, for 2 days. Then 1 drop, every 4 hours, while awake, for the next 5 days.  Marland Kitchen diltiazem (CARDIZEM LA) 120 MG 24 hr tablet Take 120 mg by mouth daily.  . ferrous sulfate 325 (65 FE) MG tablet Take 1 tablet (325 mg total) by mouth daily with breakfast.  . gemfibrozil (LOPID) 600 MG tablet Take 600 mg by  mouth 2 (two) times daily before a meal.  . glipiZIDE (GLUCOTROL XL) 2.5 MG 24 hr tablet Take 2.5 mg by mouth daily with breakfast.  . guaiFENesin (MUCINEX) 600 MG 12 hr tablet Take 600 mg by mouth 2 (two) times daily as needed for cough.  Marland Kitchen ketoconazole (NIZORAL) 2 % shampoo Apply 1 application topically 3 (three) times a week. Let sit for 3 minutes before rinsing.   Tuesday Thursday Saturday  . levothyroxine (SYNTHROID) 50 MCG tablet Take 50 mcg by mouth daily before breakfast.  . magnesium oxide (MAG-OX) 400 MG tablet Take 1 tablet (400 mg total) by mouth daily.  . metFORMIN (GLUCOPHAGE-XR) 500 MG 24 hr tablet Take 1,000 mg by mouth 2 (two) times daily.  . metoprolol succinate (TOPROL XL) 25 MG 24 hr tablet Take 1 tablet (25 mg total) by mouth daily.  . Multiple Vitamins-Minerals (OCUVITE ADULT 50+ PO) Take 1 capsule by mouth daily.  Marland Kitchen nystatin cream (MYCOSTATIN) Apply 1 application topically 3 (three) times daily.  . pantoprazole (PROTONIX) 40 MG tablet Take 1 tablet (40 mg total) by mouth 2 (two) times daily before a meal.  . phenylephrine-shark liver oil-mineral oil-petrolatum (PREPARATION H) 0.25-14-74.9 % rectal ointment Place 1 application rectally 4 (four) times daily as needed (pain and bleeding).  . pravastatin (PRAVACHOL) 80 MG tablet Take 80 mg by mouth daily.  . ramipril (ALTACE) 5 MG capsule Take 5 mg by mouth daily.  Marland Kitchen torsemide (DEMADEX) 20 MG tablet She will take 40 mg p.o. twice daily for 5 days then she will go back to her home dose of 20 mg daily.  . vitamin B-12 (CYANOCOBALAMIN) 1000 MCG tablet Take 1,000 mcg by mouth daily.    No Known Allergies    Review of Systems negative except from HPI and PMH  Physical Exam   BP 124/62   Pulse (!) 111   Ht 5\' 7"  (1.702 m)   Wt (!) 305 lb 6.4 oz (138.5 kg)   LMP  (LMP Unknown)   SpO2 96%   BMI 47.83 kg/m  Well developed and Morbidly obese in no acute distress HENT normal Neck supple with JVP-8-10 Clear Device  pocket well healed; without hematoma or erythema.  There is no tethering   Regular rate and rhythm, no  murmur Abd-soft with active BS No Clubbing cyanosis 3+  edema Skin-warm and dry A & Oriented  Grossly normal sensory and motor function    ECG with a tachycardia to 111 with ventricular pacing probably PMT Device interrogation demonstrates sinus rhythm with atrial pacing      Assessment and  Plan Atrial fibrillation-persistent  Bradycardia-presumed tachybradycardia  Pacemaker-[employer]  Heart failure-chronic-diastolic  Coronary artery disease-prior stenting x2  Morbidly obese  Ventricular undersensing bipolar @ 2.5 mV   Has reverted spontaneously to sinus rhythm after about a year.  Would have thought.  Hopefully this will help her heart failure.  Not diuresing very briskly.  We will increase her torsemide from 40 twice daily--60 every morning.  We will have her let us know if she is not diuresing briskly.  We will plan follow-up in the office to reassess volume status and metabolic profile in about 2-3 weeks.  Her hemoglobin was apparently checked yesterday by her PCP.  She was "no longer anemic "  She has progressive loss of sensing of her RV lead.  We have reprogrammed a bipolar 1.0 mV; she is sensing adequately.  The lead also works quite well unipolar.  Without symptoms of ischemia but largely nonambulatory            Current medicines are reviewed at length with the patient today .  The patient does not  have concerns regarding medicines.

## 2020-11-15 ENCOUNTER — Other Ambulatory Visit: Payer: Self-pay | Admitting: Physician Assistant

## 2020-11-15 ENCOUNTER — Telehealth: Payer: Self-pay | Admitting: Physician Assistant

## 2020-11-15 DIAGNOSIS — I5031 Acute diastolic (congestive) heart failure: Secondary | ICD-10-CM

## 2020-11-15 MED ORDER — TORSEMIDE 20 MG PO TABS: 80.0000 mg | ORAL_TABLET | Freq: Every day | ORAL | 1 refills | Status: DC

## 2020-11-15 NOTE — Telephone Encounter (Addendum)
   Ms. Schweigert called because she was seen by [name] yesterday and her torsemide dose was increased.  She does not feel like she has had that much urine output.  Her daughter is encouraging her to drink more water, and she is not sure how much water to drink.  I advised that she needs to drink between 1-2 quarts of all liquids daily.  Continue taking the torsemide 60 mg daily.  I strongly advised that she needs to track her weight daily.  She will try to figure out a way to get on scales every day.  I emphasized that consistency with the weights in the same clothing in same scales every day is important, she will try to make that happen.  I also recommended a BMET on Thursday, this would be when she has been on the new diuretic dosage for about a week.  As her urine output is not that high, I feel we should check it prior to the 2-3-week follow-up that [name] had suggested.  She states that she may have an appointment with her PCP to get blood work done next week and I advised that would be fine.  If her PCP is not doing blood work, she will need to come to our office to get the blood work done, but it will have to be arranged with her facility, [facility].  [facility] phone numbers [phone], their fax number is [phone].  Reviewed case w/ Dr Graciela Husbands, he requests she increase her torsemide to 80 mg qd.   Rx faxed to [facility].   Theodore Demark, PA-C 11/16/2020 7:42 AM

## 2020-11-17 NOTE — Telephone Encounter (Signed)
Patient is calling to follow up regarding medication adjustments. She states she would like to review how to take medication and how long to elevate her feet. Please return call to discuss.

## 2020-11-17 NOTE — Telephone Encounter (Signed)
Spoke with the pt and we went over instructions in elevating her lower extremities when sitting. She says she is improving and has had a 4 lb weight loss.. she feels she still has some fluid left to take off but will continue to monitor and let us know if she has any problems or questions prior to her appt with Francis Dowse PA 11/26/20.   Pt has planned BMET 11/20/20.

## 2020-11-19 NOTE — Telephone Encounter (Signed)
Left a message for the patient to call the office back regarding weight gain.

## 2020-11-19 NOTE — Telephone Encounter (Signed)
Patient states she has been gaining a pound a day and the medication is not working.

## 2020-11-19 NOTE — Telephone Encounter (Signed)
Patient is calling in to give an update regarding swelling and Torsemide dose change. Patient has been taking 80 mg of Torsemide per day since advised on 11/17/20. She reported a 4 pound weight loss on 12/6 (see phone note) but is now reporting gaining one pound per day for the past three days. Patient states her lower extremity swelling is about the same as before. She has been elevating her feet and continues to weigh daily.  Patient has follow-up BMET tomorrow. Advised patient that I would let [name] know.  Mentioned limiting salt intake, patient requested I call her facility, [facility] at [phone] to let them know that she needed to be on a low sodium diet. Attempted to call [facility] but their phone lines were down so I was redirected to another number of a associated facility and asked for a call back to our office to discuss.

## 2020-11-20 ENCOUNTER — Other Ambulatory Visit: Payer: Self-pay

## 2020-11-20 ENCOUNTER — Other Ambulatory Visit: Payer: Medicare Other | Admitting: *Deleted

## 2020-11-20 DIAGNOSIS — I5031 Acute diastolic (congestive) heart failure: Secondary | ICD-10-CM

## 2020-11-21 LAB — BASIC METABOLIC PANEL
BUN/Creatinine Ratio: 12 (ref 12–28)
BUN: 14 mg/dL (ref 8–27)
CO2: 25 mmol/L (ref 20–29)
Calcium: 8.6 mg/dL — ABNORMAL LOW (ref 8.7–10.3)
Chloride: 94 mmol/L — ABNORMAL LOW (ref 96–106)
Creatinine, Ser: 1.15 mg/dL — ABNORMAL HIGH (ref 0.57–1.00)
GFR calc Af Amer: 50 mL/min/{1.73_m2} — ABNORMAL LOW (ref >59)
GFR calc non Af Amer: 43 mL/min/{1.73_m2} — ABNORMAL LOW (ref >59)
Glucose: 241 mg/dL — ABNORMAL HIGH (ref 65–99)
Potassium: 3.6 mmol/L (ref 3.5–5.2)
Sodium: 139 mmol/L (ref 134–144)

## 2020-11-24 ENCOUNTER — Telehealth: Payer: Self-pay | Admitting: Cardiology

## 2020-11-24 NOTE — Telephone Encounter (Signed)
Attempted phone call to pt as pt's daughter is not listed as DPR.  Left message to contact RN at [phone].

## 2020-11-24 NOTE — Progress Notes (Signed)
Cardiology Office Note Date:  11/24/2020  Patient ID:  Kirsten Jensen, Kirsten Jensen 28-Dec-1931, MRN [id number] PCP:  Melida Quitter, MD  Cardiologist/EP:  [name]    Chief Complaint:  Persistent edema  History of Present Illness: Kirsten Jensen is a 86 y.o. female with history of CAD (PCI approx 5 years ago in [place]), AFib, flutter, tachy-brady w/PPM, HTN, DM, OSA (w/CPAP).  He comes in today to be seen for [name].  Last seen by him 03/18/2020. Per that office visit note, "Undertook rapid atrial pacing with termination of her atrial flutter. We will follow for recurrence and then make a decision regarding antiarrhythmic therapy and/or catheter ablation of the AV node"  He was admitted to [facility] 04/05/20  w/CP, EKG showed rate controlled atrial flutter with LVH and RBBB. High-sensitivity troponin minimally elevated and flat at 22 >> 24 >> 22. Chest x-ray showed no acute findings. WBC 9.5, Hgb 11.6, Plts 371. Na 140, K 4.1, Glucose 157, Creatinine 1.05. COVID-19 negative Cardiology, Dr. Jacques Navy consulted, CP was felt atypical, planned for echo, noted with low normal EF 50% and RWMA in the distribution of perfusion defects seen on prior stress testing. Suggests no change over time.  - consider outpatient stress test, Continued on OAC, was rate controlled, dilt/home meds continued Discharged 04/06/20   I saw her May 2021 She feels well!  She is a resident at [facility] 2/2 worsening macular degeneration.  She feels like the care is slow and unpredictable, and feels like she is a good patient advocate for the other residents that are less able to verbalize or get help/attention.  She is a retired Child psychotherapist, and feels like she is there for a reason and has been able to make some observations, recommendations and they have made some effective changes already in staffing and care at the facility. The night she went to the hospital she had eaten dinner and once back to her room felt a central chest  pressure, she felt it was probably not her heart, more her breast bone, but it stayed a while and she was worried about being the whole night there with it, so went tot he hospital.  It seemed to self resolve and has not had it again. She denies any overt palpitations.  She is infrequently aware of a irregular heart beat.  No dizzy spells, near syncope or syncope, no bleeding or signs of bleeding In review of the week or 2 of SR that she had, she does not think she could tell the difference.  And in comparison to a month ago or longer, she feels the best now that she has in quite a long time with improving exertional capacity and endurance. Given feeling well and no clear symptom improvement when she was in SR, no changes were made, and was rate controlled.    Nov 2021 hospitalized with GIB, received 1u PRBC, GI was consulted the patient underwent EGD and was found to have a nonbleeding ulcer, underwent colonoscopy as that showed diverticulosis and 2 small polyps which were removed. CT scan of the abdomen and pelvis was unremarkable Discharged 11/08/20  TTE noted LVEF 55-60%, ild conc LVH, RV OK, EVSP 58.76mmHg, mild-mod TR  She saw [name] 11/14/2020, notes that after about a year of AFlutter had spontaneously reverted back to SR.  Was volume OL her torsemide increased from 40>60mg  daily, planned f/u 2-3 weeks to revisit her volume/response to diuresis. Also noted RV lead with progressive loss  of sensing of her RV lead.  We have reprogrammed a bipolar 1.0 mV; she is sensing adequately.  The lead also works quite well unipolar.  There have been  A number of phone notes reporting no appreciable improvement/change > 80mg  daily > initial improvement though leveled off and apparently gaining again..  TODAY She is accompanied by her daughter in law, continues [facility]. She voices much concern states that that she is not getting all of her medicines all of the time.  She has no diet  options for reduced sodium And thinks that this is contributing to slow progress, and perhaps part of the problem.  She feels like she has gotten some improvement in her swelling, they are not as tight and painfully swollen like they had been.  She has not noticed any particular or clear up tick in urinary frequency/amount She is not SOB, not at rest, no symptoms of orthopnea or PND.  She also denies DOE, though says she is generally weaker, and feels like her strength to walk as much is less then it had been previously, but specifically not because of SOB, but generalized weakness.  No CP, no near syncope or syncope, no palpitations  She says that she seems to keep gaining and losing the same few pounds since her last visit, though also reports that her weight at home a couple days after seeing Dr. Graciela HusbandsKlein, her weight peaked at 315lbs, this morning was 297.  She says this has been AM weights.  A copy of her MAR is sent with her and reviewed with the patient/daughter in law It seems that she was already getting Torsemide 60mg  daily when she saw Dr. Graciela HusbandsKlein, by their record, and was increased to 80mg  daily on 11/17/20. It looks like perhaps did not get any on the 12th or 14th Her K+ was a one time 5 day course of tx that ended 11/16/20.   She has questions about her hospital stay and cause of her anemia, and thought to that we would be discussing this today, I mentioned that was presumptive UGIB, though no active bleeding at the tie of hr procedures noted, though deferred specifics otherwise and ,anagement to her PMD  They ask about travel, she had initially had plans to fly to see family for the holiday.   Device information BSCi dual chamber PPM implanted 09/14/2018   Past Medical History:  Diagnosis Date  . Atrial fibrillation (HCC)   . Coronary artery disease    stenting x 2  . Diabetes (HCC)   . Dyslipidemia 04/05/2020  . Hypertension   . Morbid obesity (HCC)   . Pacemaker   .  Paroxysmal atrial fibrillation (HCC) 11/06/2019  . Secondary hypercoagulable state (HCC) 11/06/2019  . T2DM (type 2 diabetes mellitus) (HCC) 04/05/2020  . Tachycardia-bradycardia syndrome (HCC) 03/17/2020    Past Surgical History:  Procedure Laterality Date  . COLONOSCOPY N/A 11/02/2020   Procedure: COLONOSCOPY;  Surgeon: Vida RiggerMagod, Marc, MD;  [facility];  Service: Endoscopy;  Laterality: N/A;  . ESOPHAGOGASTRODUODENOSCOPY N/A 11/01/2020   Procedure: ESOPHAGOGASTRODUODENOSCOPY (EGD);  Surgeon: Vida RiggerMagod, Marc, MD;  [facility];  Service: Endoscopy;  Laterality: N/A;    Current Outpatient Medications  Medication Sig Dispense Refill  . acetaminophen (TYLENOL) 500 MG tablet Take 500 mg by mouth 2 (two) times daily as needed for mild pain.     Marland Kitchen. acetaminophen-codeine (TYLENOL #3) 300-30 MG tablet Take 1 tablet by mouth 3 (three) times daily as needed  for moderate pain.    Marland Kitchen acetic acid 0.25 % irrigation Irrigate with 1 application as directed See admin instructions. to wash perineum once a week during bathing    . albuterol (VENTOLIN HFA) 108 (90 Base) MCG/ACT inhaler Inhale 1 puff into the lungs every 4 (four) hours as needed for wheezing or shortness of breath.    Marland Kitchen apixaban (ELIQUIS) 5 MG TABS tablet Take 5 mg by mouth 2 (two) times daily.    . benzonatate (TESSALON) 100 MG capsule Take 100 mg by mouth 3 (three) times daily as needed for cough.     Marland Kitchen buPROPion (WELLBUTRIN) 100 MG tablet Take 100 mg by mouth daily.    . cholecalciferol (VITAMIN D3) 25 MCG (1000 UNIT) tablet Take 1,000 Units by mouth daily.    Marland Kitchen diltiazem (CARDIZEM LA) 120 MG 24 hr tablet Take 120 mg by mouth daily.    . ferrous sulfate 325 (65 FE) MG tablet Take 1 tablet (325 mg total) by mouth daily with breakfast. 30 tablet 3  . gemfibrozil (LOPID) 600 MG tablet Take 600 mg by mouth 2 (two) times daily before a meal.    . glipiZIDE (GLUCOTROL XL) 2.5 MG 24 hr tablet Take 2.5 mg by mouth daily with  breakfast.    . guaiFENesin (MUCINEX) 600 MG 12 hr tablet Take 600 mg by mouth 2 (two) times daily as needed for cough.    Marland Kitchen ketoconazole (NIZORAL) 2 % shampoo Apply 1 application topically 3 (three) times a week. Let sit for 3 minutes before rinsing.   Tuesday Thursday Saturday    . levothyroxine (SYNTHROID) 50 MCG tablet Take 50 mcg by mouth daily before breakfast.    . magnesium oxide (MAG-OX) 400 MG tablet Take 1 tablet (400 mg total) by mouth daily. 30 tablet 3  . metFORMIN (GLUCOPHAGE-XR) 500 MG 24 hr tablet Take 1,000 mg by mouth 2 (two) times daily.    . metoprolol succinate (TOPROL XL) 25 MG 24 hr tablet Take 1 tablet (25 mg total) by mouth daily. 30 tablet 3  . Multiple Vitamins-Minerals (OCUVITE ADULT 50+ PO) Take 1 capsule by mouth daily.    Marland Kitchen nystatin cream (MYCOSTATIN) Apply 1 application topically 3 (three) times daily.    . pantoprazole (PROTONIX) 40 MG tablet Take 1 tablet (40 mg total) by mouth 2 (two) times daily before a meal. 30 tablet 3  . phenylephrine-shark liver oil-mineral oil-petrolatum (PREPARATION H) 0.25-14-74.9 % rectal ointment Place 1 application rectally 4 (four) times daily as needed (pain and bleeding).    . potassium chloride SA (KLOR-CON) 20 MEQ tablet Take 1 tablet (20 mEq total) by mouth 2 (two) times daily for 5 days. 10 tablet 0  . pravastatin (PRAVACHOL) 80 MG tablet Take 80 mg by mouth daily.    . ramipril (ALTACE) 5 MG capsule Take 5 mg by mouth daily.    Marland Kitchen torsemide (DEMADEX) 20 MG tablet Take 4 tablets (80 mg total) by mouth daily. 120 tablet 1  . vitamin B-12 (CYANOCOBALAMIN) 1000 MCG tablet Take 1,000 mcg by mouth daily.     No current facility-administered medications for this visit.    Allergies:   Patient has no known allergies.   Social History:  The patient  reports that she has never smoked. She has never used smokeless tobacco. She reports previous alcohol use. She reports that she does not use drugs.   Family History:  The patient's  family history includes CAD in her father; Hypertension in her  mother; Stroke in her mother.  ROS:  Please see the history of present illness.  All other systems are reviewed and otherwise negative.   PHYSICAL EXAM:  VS:  LMP  (LMP Unknown)  BMI: There is no height or weight on file to calculate BMI. Well nourished, well developed, in no acute distress  HEENT: normocephalic, atraumatic  Neck: no JVD, carotid bruits or masses Cardiac: RRR; no significant murmurs, no rubs, or gallops Lungs:  CTA b/l, no wheezing, rhonchi or rales  Abd: soft, nontender, obese MS: no deformity, age appropriate atrophy Ext: 2+ edema to just above mid-shinm skin is intact  Skin: warm and dry, no rash Neuro:  No gross deficits appreciated Psych: euthymic mood, full affect  PPM site is stable, no tethering or discomfort   EKG:  Not done today  PPM interrogation done today and reviewed by myself:  Battery is stable presents AS/VS 60's A lead measurements are stable V sensing is 2.0, 3.0 and 2.8, sensing is programmed at 1.0V, impedance and thresholds stable No new arrhthymias AP 14% VP 3%   10/31/2020; TTE IMPRESSIONS  1. Left ventricular ejection fraction, by estimation, is 55 to 60%. The  left ventricle has normal function. The left ventricle has no regional  wall motion abnormalities. There is mild concentric left ventricular  hypertrophy. Left ventricular diastolic  function could not be evaluated.  2. Right ventricular systolic function is normal. The right ventricular  size is normal. There is moderately elevated pulmonary artery systolic  pressure. The estimated right ventricular systolic pressure is 58.8 mmHg.  3. The mitral valve is degenerative. Mild mitral valve regurgitation.  4. Tricuspid valve regurgitation is mild to moderate.  5. The aortic valve is tricuspid. There is mild calcification of the  aortic valve. There is mild thickening of the aortic valve. Aortic valve   regurgitation is not visualized. Mild aortic valve sclerosis is present,  with no evidence of aortic valve  stenosis.  6. The inferior vena cava is dilated in size with <50% respiratory  variability, suggesting right atrial pressure of 15 mmHg.   Comparison(s): Changes from prior study are noted. EF 55-60%. Moderately  elevated pulmonary pressures.    04/06/2020: TTE IMPRESSIONS  1. Left ventricular ejection fraction, by estimation, is 50%. The left  ventricle has low normal function. Left ventricular endocardial border not  optimally defined to evaluate regional wall motion. The left ventricular  internal cavity size was mildly  dilated. Left ventricular diastolic parameters are indeterminate.  2. Right ventricular systolic function is normal. The right ventricular  size is mildly enlarged. There is mildly elevated pulmonary artery  systolic pressure. The estimated right ventricular systolic pressure is  39.0 mmHg.  3. Left atrial size was mildly dilated.  4. Right atrial size was mildly dilated.  5. The mitral valve is degenerative. Mild mitral valve regurgitation. No  evidence of mitral stenosis.  6. The aortic valve is normal in structure. Aortic valve regurgitation is  not visualized. No aortic stenosis is present.  7. The inferior vena cava is normal in size with greater than 50%  respiratory variability, suggesting right atrial pressure of 3 mmHg.     Recent Labs: 10/30/2020: B Natriuretic Peptide 628.9 10/31/2020: TSH 1.050 11/05/2020: ALT 24; Magnesium 1.5 11/08/2020: Hemoglobin 8.8; Platelets 277 11/20/2020: BUN 14; Creatinine, Ser 1.15; Potassium 3.6; Sodium 139  No results found for requested labs within last 8760 hours.   Estimated Creatinine Clearance: 51.2 mL/min (A) (by C-G formula based on SCr  of 1.15 mg/dL (H)).   Wt Readings from Last 3 Encounters:  11/14/20 (!) 305 lb 6.4 oz (138.5 kg)  11/08/20 (!) 326 lb 1 oz (147.9 kg)  10/30/20 (!) 314 lb  (142.4 kg)     Other studies reviewed: Additional studies/records reviewed today include: summarized above  ASSESSMENT AND PLAN:  1. CAD     No anginal symptoms     Off plavix, since her hospital stay     On BB, statin and lopid, labs monitored via her PMD  2. PPM     RV sensing issue is known, no programming changes made, no undersensing was noted while I was with her, only 3% VP     No programming changes made  3. Persistent AFib, flutter     CHA2DS2Vasc is 6, on Eliquis, appropriately dosed     Recent presume UGIB     I think her anemia plays a role in her generalized weakness as well     Will check CBC today  4. HTN     Looks OK   5. HLD     Not addressed today  6. HFpEF     She has had some improvement.     I think she had been on 60mg  already when she saw Dr. (if I am reading her MAR correctly)     The 1st increase seems to be the 80mg  dose, by her account she has less edema, and by her reported home weights weight loss as well, though not as dramatically by our scale.      RA sats are good, she has no SOB/DOE, or nocturnal symptoms      Last BMET with K+ 3.6, Creat 1.15      She is not getting K+ supplementation currently      I will add aldactone 12.5mg  daily, get a BMET today      Daily morning (0700) weights, to be done prior to eating/after urinating (she is familiar)      I have asked that the SNF verify with the patient what medicines she is being given at the time of dispensing (she is legally blind) so she can be comfortable and confident that she is getting her medicines.      I have also asked that she have low sodium meals, they state that they have requested being able to bring in her meals though is not allowed.   I have advised against travel at this time until we have a better handle on her edema, diuretic needs. Time spent today was Graciela Husbands   Disposition: plan close follow up, see her back 7-10 days, sooner if needed, will adjust meds as  labs return.  Current medicines are reviewed at length with the patient today.  The patient did not have any concerns regarding medicines.  , PA-C 11/24/2020 12:49 PM     [facility] [address] [place] [phone] (office)  [phone] (fax)

## 2020-11-24 NOTE — Telephone Encounter (Signed)
Pt c/o medication issue:  1. Name of Medication: torsemide (DEMADEX) 20 MG tablet [[id number]]    2. How are you currently taking this medication (dosage and times per day)? torsemide (DEMADEX) 20 MG tablet [[id number]]   3. Are you having a reaction (difficulty breathing--STAT)? No   4. What is your medication issue?  Pt daughter called and stated that this med was increased on 12/4.  She stated it has not been helping.  Her feet an ankle are exactly the same .  She has an appt on wed .  She would like to know what they need to do in the mean time ?      Et  [phone]

## 2020-11-24 NOTE — Telephone Encounter (Signed)
Spoke with pt's son Kirsten Jensen, [place] and pt's daughter-in-law Kirsten Jensen who reports pt has been taking Torsemide 80mg  qam x 1 week without any improvement in symptoms.  Pt's son reports pt's weight has fluctuated but may be up as much as 10 pounds since her visit with Dr .  Denies severe SOB at this time but pt continues to have LE edema.   Pt has an appointment to see Graciela Husbands on 11/26/2020. Encouraged low Na+ diet and elevation of feet and legs as much as possible.  Reviewed ED precautions.  Will forward information to Ms 11/28/2020 for review and recommendation.  Pt's son verbalizes understanding and agrees with current plan.

## 2020-11-25 ENCOUNTER — Telehealth: Payer: Self-pay | Admitting: Internal Medicine

## 2020-11-25 NOTE — Telephone Encounter (Signed)
Pt stated she was returning Marsha's call. Mindi Junker stated she spoke with the pt's son yesterday and is still waiting on a response from a PA but will call back once she gets answers.

## 2020-11-25 NOTE — Telephone Encounter (Signed)
Kirsten Jensen  if she is still volume overloaded she may need some zaroxylyn  or ? IV

## 2020-11-26 ENCOUNTER — Other Ambulatory Visit: Payer: Self-pay

## 2020-11-26 ENCOUNTER — Encounter: Payer: Self-pay | Admitting: Physician Assistant

## 2020-11-26 ENCOUNTER — Ambulatory Visit (INDEPENDENT_AMBULATORY_CARE_PROVIDER_SITE_OTHER): Payer: Medicare Other | Admitting: Physician Assistant

## 2020-11-26 VITALS — BP 128/64 | HR 78 | Ht 67.0 in | Wt 301.4 lb

## 2020-11-26 DIAGNOSIS — I10 Essential (primary) hypertension: Secondary | ICD-10-CM

## 2020-11-26 DIAGNOSIS — I2510 Atherosclerotic heart disease of native coronary artery without angina pectoris: Secondary | ICD-10-CM | POA: Diagnosis not present

## 2020-11-26 DIAGNOSIS — I5030 Unspecified diastolic (congestive) heart failure: Secondary | ICD-10-CM

## 2020-11-26 DIAGNOSIS — R079 Chest pain, unspecified: Secondary | ICD-10-CM | POA: Diagnosis not present

## 2020-11-26 DIAGNOSIS — Z79899 Other long term (current) drug therapy: Secondary | ICD-10-CM | POA: Diagnosis not present

## 2020-11-26 DIAGNOSIS — I482 Chronic atrial fibrillation, unspecified: Secondary | ICD-10-CM

## 2020-11-26 DIAGNOSIS — Z950 Presence of cardiac pacemaker: Secondary | ICD-10-CM | POA: Diagnosis not present

## 2020-11-26 MED ORDER — SPIRONOLACTONE 25 MG PO TABS: 12.5000 mg | ORAL_TABLET | Freq: Every day | ORAL | 1 refills | Status: DC

## 2020-11-26 NOTE — Patient Instructions (Addendum)
Medication Instructions:   START TAKING ALDACTONE 12.5 MG ONCE A DAY   *If you need a refill on your cardiac medications before your next appointment, please call your pharmacy*   Lab Work:  BMET AND CBC TODAY    If you have labs (blood work) drawn today and your tests are completely normal, you will receive your results only by: Marland Kitchen MyChart Message (if you have MyChart) OR . A paper copy in the mail If you have any lab test that is abnormal or we need to change your treatment, we will call you to review the results.   Testing/Procedures: NONE ORDERED  TODAY  Follow-Up: At [facility], you and your health needs are our priority.  As part of our continuing mission to provide you with exceptional heart care, we have created designated Provider Care Teams.  These Care Teams include your primary Cardiologist (physician) and Advanced Practice Providers (APPs -  Physician Assistants and Nurse Practitioners) who all work together to provide you with the care you need, when you need it.  We recommend signing up for the patient portal called "MyChart".  Sign up information is provided on this After Visit Summary.  MyChart is used to connect with patients for Virtual Visits (Telemedicine).  Patients are able to view lab/test results, encounter notes, upcoming appointments, etc.  Non-urgent messages can be sent to your provider as well.   To learn more about what you can do with MyChart, go to [email or web address].    Your next appointment:   7-10 day(s)  The format for your next appointment:   In Person  Provider:   You may see Sherryl Manges, MD or one of the following Advanced Practice Providers on your designated Care Team:    Francis Dowse, [place]  Casimiro Needle "Mardelle Matte" Lanna Poche, [place]    Other Instructions  START  MORNING DAILY WEIGHTS  MAKE SURE MEDCATIONS THAT ARE BEING TAKEN ARE VERBALLY CONFIRMED WITH PATIENTS   Low-Sodium Eating Plan Sodium, which is an element that makes up salt,  helps you maintain a healthy balance of fluids in your body. Too much sodium can increase your blood pressure and cause fluid and waste to be held in your body. Your health care provider or dietitian may recommend following this plan if you have high blood pressure (hypertension), kidney disease, liver disease, or heart failure. Eating less sodium can help lower your blood pressure, reduce swelling, and protect your heart, liver, and kidneys. What are tips for following this plan? General guidelines  Most people on this plan should limit their sodium intake to 1,500-2,000 mg (milligrams) of sodium each day. Reading food labels   The Nutrition Facts label lists the amount of sodium in one serving of the food. If you eat more than one serving, you must multiply the listed amount of sodium by the number of servings.  Choose foods with less than 140 mg of sodium per serving.  Avoid foods with 300 mg of sodium or more per serving. Shopping  Look for lower-sodium products, often labeled as "low-sodium" or "no salt added."  Always check the sodium content even if foods are labeled as "unsalted" or "no salt added".  Buy fresh foods. ? Avoid canned foods and premade or frozen meals. ? Avoid canned, cured, or processed meats  Buy breads that have less than 80 mg of sodium per slice. Cooking  Eat more home-cooked food and less restaurant, buffet, and fast food.  Avoid adding salt when cooking. Use salt-free seasonings or  herbs instead of table salt or sea salt. Check with your health care provider or pharmacist before using salt substitutes.  Cook with plant-based oils, such as canola, sunflower, or olive oil. Meal planning  When eating at a restaurant, ask that your food be prepared with less salt or no salt, if possible.  Avoid foods that contain MSG (monosodium glutamate). MSG is sometimes added to [place] food, bouillon, and some canned foods. What foods are recommended? The items  listed may not be a complete list. Talk with your dietitian about what dietary choices are best for you. Grains Low-sodium cereals, including oats, puffed wheat and rice, and shredded wheat. Low-sodium crackers. Unsalted rice. Unsalted pasta. Low-sodium bread. Whole-grain breads and whole-grain pasta. Vegetables Fresh or frozen vegetables. "No salt added" canned vegetables. "No salt added" tomato sauce and paste. Low-sodium or reduced-sodium tomato and vegetable juice. Fruits Fresh, frozen, or canned fruit. Fruit juice. Meats and other protein foods Fresh or frozen (no salt added) meat, poultry, seafood, and fish. Low-sodium canned tuna and salmon. Unsalted nuts. Dried peas, beans, and lentils without added salt. Unsalted canned beans. Eggs. Unsalted nut butters. Dairy Milk. Soy milk. Cheese that is naturally low in sodium, such as ricotta cheese, fresh mozzarella, or Swiss cheese Low-sodium or reduced-sodium cheese. Cream cheese. Yogurt. Fats and oils Unsalted butter. Unsalted margarine with no trans fat. Vegetable oils such as canola or olive oils. Seasonings and other foods Fresh and dried herbs and spices. Salt-free seasonings. Low-sodium mustard and ketchup. Sodium-free salad dressing. Sodium-free light mayonnaise. Fresh or refrigerated horseradish. Lemon juice. Vinegar. Homemade, reduced-sodium, or low-sodium soups. Unsalted popcorn and pretzels. Low-salt or salt-free chips. What foods are not recommended? The items listed may not be a complete list. Talk with your dietitian about what dietary choices are best for you. Grains Instant hot cereals. Bread stuffing, pancake, and biscuit mixes. Croutons. Seasoned rice or pasta mixes. Noodle soup cups. Boxed or frozen macaroni and cheese. Regular salted crackers. Self-rising flour. Vegetables Sauerkraut, pickled vegetables, and relishes. Olives. [place] fries. Onion rings. Regular canned vegetables (not low-sodium or reduced-sodium). Regular  canned tomato sauce and paste (not low-sodium or reduced-sodium). Regular tomato and vegetable juice (not low-sodium or reduced-sodium). Frozen vegetables in sauces. Meats and other protein foods Meat or fish that is salted, canned, smoked, spiced, or pickled. Bacon, ham, sausage, hotdogs, corned beef, chipped beef, packaged lunch meats, salt pork, jerky, pickled herring, anchovies, regular canned tuna, sardines, salted nuts. Dairy Processed cheese and cheese spreads. Cheese curds. Blue cheese. Feta cheese. String cheese. Regular cottage cheese. Buttermilk. Canned milk. Fats and oils Salted butter. Regular margarine. Ghee. Bacon fat. Seasonings and other foods Onion salt, garlic salt, seasoned salt, table salt, and sea salt. Canned and packaged gravies. Worcestershire sauce. Tartar sauce. Barbecue sauce. Teriyaki sauce. Soy sauce, including reduced-sodium. Steak sauce. Fish sauce. Oyster sauce. Cocktail sauce. Horseradish that you find on the shelf. Regular ketchup and mustard. Meat flavorings and tenderizers. Bouillon cubes. Hot sauce and Tabasco sauce. Premade or packaged marinades. Premade or packaged taco seasonings. Relishes. Regular salad dressings. Salsa. Potato and tortilla chips. Corn chips and puffs. Salted popcorn and pretzels. Canned or dried soups. Pizza. Frozen entrees and pot pies. Summary  Eating less sodium can help lower your blood pressure, reduce swelling, and protect your heart, liver, and kidneys.  Most people on this plan should limit their sodium intake to 1,500-2,000 mg (milligrams) of sodium each day.  Canned, boxed, and frozen foods are high in sodium. Restaurant foods, fast foods,  and pizza are also very high in sodium. You also get sodium by adding salt to food.  Try to cook at home, eat more fresh fruits and vegetables, and eat less fast food, canned, processed, or prepared foods. This information is not intended to replace advice given to you by your health care  provider. Make sure you discuss any questions you have with your health care provider. Document Revised: 11/11/2017 Document Reviewed: 11/22/2016 Elsevier Patient Education  2020 [employer].

## 2020-11-27 ENCOUNTER — Ambulatory Visit: Payer: Medicare Other | Admitting: Physician Assistant

## 2020-11-27 LAB — BASIC METABOLIC PANEL
BUN/Creatinine Ratio: 16 (ref 12–28)
BUN: 20 mg/dL (ref 8–27)
CO2: 29 mmol/L (ref 20–29)
Calcium: 8.6 mg/dL — ABNORMAL LOW (ref 8.7–10.3)
Chloride: 91 mmol/L — ABNORMAL LOW (ref 96–106)
Creatinine, Ser: 1.24 mg/dL — ABNORMAL HIGH (ref 0.57–1.00)
GFR calc Af Amer: 45 mL/min/{1.73_m2} — ABNORMAL LOW (ref >59)
GFR calc non Af Amer: 39 mL/min/{1.73_m2} — ABNORMAL LOW (ref >59)
Glucose: 286 mg/dL — ABNORMAL HIGH (ref 65–99)
Potassium: 4 mmol/L (ref 3.5–5.2)
Sodium: 136 mmol/L (ref 134–144)

## 2020-11-27 LAB — CBC
Hematocrit: 29.1 % — ABNORMAL LOW (ref 34.0–46.6)
Hemoglobin: 9.4 g/dL — ABNORMAL LOW (ref 11.1–15.9)
MCH: 29.1 pg (ref 26.6–33.0)
MCHC: 32.3 g/dL (ref 31.5–35.7)
MCV: 90 fL (ref 79–97)
Platelets: 326 10*3/uL (ref 150–450)
RBC: 3.23 x10E6/uL — ABNORMAL LOW (ref 3.77–5.28)
RDW: 14.9 % (ref 11.7–15.4)
WBC: 7.8 10*3/uL (ref 3.4–10.8)

## 2020-12-01 NOTE — Telephone Encounter (Signed)
Noted--- if it isnt effective maybe a dose or two of zaroxolyn  Thanks SK

## 2020-12-03 ENCOUNTER — Encounter: Payer: Medicare Other | Admitting: Student

## 2020-12-09 ENCOUNTER — Ambulatory Visit (INDEPENDENT_AMBULATORY_CARE_PROVIDER_SITE_OTHER): Payer: Medicare Other

## 2020-12-09 DIAGNOSIS — I495 Sick sinus syndrome: Secondary | ICD-10-CM | POA: Diagnosis not present

## 2020-12-09 LAB — CUP PACEART REMOTE DEVICE CHECK
Battery Remaining Longevity: 72 mo
Battery Remaining Percentage: 100 %
Brady Statistic RA Percent Paced: 28 %
Brady Statistic RV Percent Paced: 3 %
Date Time Interrogation Session: 20211228011100
Implantable Lead Implant Date: 20191003
Implantable Lead Implant Date: 20191003
Implantable Lead Location: 753859
Implantable Lead Location: 753860
Implantable Lead Model: 7741
Implantable Lead Model: 7742
Implantable Lead Serial Number: 1047491
Implantable Lead Serial Number: 1074664
Implantable Pulse Generator Implant Date: 20191003
Lead Channel Impedance Value: 750 Ohm
Lead Channel Impedance Value: 769 Ohm
Lead Channel Pacing Threshold Amplitude: 0.5 V
Lead Channel Pacing Threshold Amplitude: 1 V
Lead Channel Pacing Threshold Pulse Width: 0.4 ms
Lead Channel Pacing Threshold Pulse Width: 0.4 ms
Lead Channel Setting Pacing Amplitude: 2 V
Lead Channel Setting Pacing Amplitude: 2.5 V
Lead Channel Setting Pacing Pulse Width: 0.4 ms
Lead Channel Setting Sensing Sensitivity: 1 mV
Pulse Gen Serial Number: 444544

## 2020-12-09 NOTE — Progress Notes (Signed)
Cardiology Office Note Date:  12/09/2020  Patient ID:  Kirsten, Jensen March 31, 1932, MRN [id number] PCP:  Melida Quitter, MD  Cardiologist/EP:  [name]    Chief Complaint:  planned f/u  History of Present Illness: Kirsten Jensen is a 86 y.o. female with history of CAD (PCI approx 5 years ago in [place]), AFib, flutter, tachy-brady w/PPM, HTN, DM, OSA (w/CPAP).  He comes in today to be seen for [name].  Last seen by him 03/18/2020. Per that office visit note, "Undertook rapid atrial pacing with termination of her atrial flutter. We will follow for recurrence and then make a decision regarding antiarrhythmic therapy and/or catheter ablation of the AV node"  She was admitted to [facility] 04/05/20  w/CP, EKG showed rate controlled atrial flutter with LVH and RBBB. High-sensitivity troponin minimally elevated and flat at 22 >> 24 >> 22. Chest x-ray showed no acute findings. WBC 9.5, Hgb 11.6, Plts 371. Na 140, K 4.1, Glucose 157, Creatinine 1.05. COVID-19 negative Cardiology, Dr. Jacques Navy consulted, CP was felt atypical, planned for echo, noted with low normal EF 50% and RWMA in the distribution of perfusion defects seen on prior stress testing. Suggests no change over time.  - consider outpatient stress test, Continued on OAC, was rate controlled, dilt/home meds continued Discharged 04/06/20   I saw her May 2021 She feels well!  She is a resident at [facility] 2/2 worsening macular degeneration.  She feels like the care is slow and unpredictable, and feels like she is a good patient advocate for the other residents that are less able to verbalize or get help/attention.  She is a retired Child psychotherapist, and feels like she is there for a reason and has been able to make some observations, recommendations and they have made some effective changes already in staffing and care at the facility. The night she went to the hospital she had eaten dinner and once back to her room felt a central chest  pressure, she felt it was probably not her heart, more her breast bone, but it stayed a while and she was worried about being the whole night there with it, so went tot he hospital.  It seemed to self resolve and has not had it again. She denies any overt palpitations.  She is infrequently aware of a irregular heart beat.  No dizzy spells, near syncope or syncope, no bleeding or signs of bleeding In review of the week or 2 of SR that she had, she does not think she could tell the difference.  And in comparison to a month ago or longer, she feels the best now that she has in quite a long time with improving exertional capacity and endurance. Given feeling well and no clear symptom improvement when she was in SR, no changes were made, and was rate controlled.    Nov 2021 hospitalized with GIB, received 1u PRBC, GI was consulted the patient underwent EGD and was found to have a nonbleeding ulcer, underwent colonoscopy as that showed diverticulosis and 2 small polyps which were removed. CT scan of the abdomen and pelvis was unremarkable Discharged 11/08/20  TTE noted LVEF 55-60%, mild conc LVH, RV OK, EVSP 58.58mmHg, mild-mod TR  She saw [name] 11/14/2020, notes that after about a year of AFlutter had spontaneously reverted back to SR.  Was volume OL her torsemide increased from 40>60mg  daily, planned f/u 2-3 weeks to revisit her volume/response to diuresis. Also noted RV lead with progressive loss  of sensing of her RV lead.  We have reprogrammed a bipolar 1.0 mV; she is sensing adequately.  The lead also works quite well unipolar.  There have been  A number of phone notes reporting no appreciable improvement/change > 80mg  daily > initial improvement though leveled off and apparently gaining again..  I saw her 11/26/20 She is accompanied by her daughter in law, continues to reside at 11/28/20. She voices much concern states that that she is not getting all of her medicines all of the time.   She has no diet options for reduced sodium And thinks that this is contributing to slow progress, and perhaps part of the problem.  She feels like she has gotten some improvement in her swelling, they are not as tight and painfully swollen like they had been.  She has not noticed any particular or clear up tick in urinary frequency/amount She is not SOB, not at rest, no symptoms of orthopnea or PND.  She also denies DOE, though says she is generally weaker, and feels like her strength to walk as much is less then it had been previously, but specifically not because of SOB, but generalized weakness.  No CP, no near syncope or syncope, no palpitations  She says that she seems to keep gaining and losing the same few pounds since her last visit, though also reports that her weight at home a couple days after seeing Dr. [facility], her weight peaked at 315lbs, this morning was 297.  She says this has been AM weights.  A copy of her MAR is sent with her and reviewed with the patient/daughter in law It seems that she was already getting Torsemide 60mg  daily when she saw [name], by their record, and was increased to 80mg  daily on 11/17/20. It looks like perhaps did not get any on the 12th or 14th Her K+ was a one time 5 day course of tx that ended 11/16/20.   She has questions about her hospital stay and cause of her anemia, and thought to that we would be discussing this today, I mentioned that was presumptive UGIB, though no active bleeding at the time of hr procedures noted, though deferred specifics otherwise and management to her PMD  I was not convinced at the time that Dr. 13 made an adjustment to her diuretics that this was actually a change, seemed her MAR noted same dose already? The pt was fairly certain she was not getting all of her meds, MAR reviewed and did not some missed doses. Dr. 15 in chart message had suggested consideration for zaroxolyn, though given she seemed to have not gotten  all the diuresis we thought she had, I opted to add spironolactone. Daily Requested daily weights, low sodium diet availability and asked that med tech verbally communicated with the patient what meds she is getting when dispensed (she is legally blind) Planned for close follow up  TODAY She is again accompanied by her daughter in law. She is still not convinced that she is getting her medicines all of the time, but does rport improvement in her swelling. She says she is being seen now by a lymphedema clinic nurse and is getting wraps done and at the last visit the measurements of her legs were indeed smaller. She is able to get her feet into her shoes now easily and had been unable to at the time of her last visit. Her daughter in law agrees, there is clear improvement,. That being said her  weight is up, here and by her daily weights at the SNF. Her daughter in law reports that she has been liberal with her eating. The patient though feels OK, she is quite sleepy today and says she was up watching movies until well past 2AM this morning.  She denies any CP, palpitations, no cardiac awareness. She sleeps with CPAP and denies any nocturnal SOB/symptoms No rest SOB, she has some baseline DOE that she says is unchanged for a very long time.  No dizzy spells, near syncope or syncope.  NO bleeding or signs of bleeding  Device information BSCi dual chamber PPM implanted 09/14/2018   Past Medical History:  Diagnosis Date  . Atrial fibrillation (HCC)   . Coronary artery disease    stenting x 2  . Diabetes (HCC)   . Dyslipidemia 04/05/2020  . Hypertension   . Morbid obesity (HCC)   . Pacemaker   . Paroxysmal atrial fibrillation (HCC) 11/06/2019  . Secondary hypercoagulable state (HCC) 11/06/2019  . T2DM (type 2 diabetes mellitus) (HCC) 04/05/2020  . Tachycardia-bradycardia syndrome (HCC) 03/17/2020    Past Surgical History:  Procedure Laterality Date  . COLONOSCOPY N/A 11/02/2020    Procedure: COLONOSCOPY;  Surgeon: Vida Rigger, MD;  [facility] ENDOSCOPY;  Service: Endoscopy;  Laterality: N/A;  . ESOPHAGOGASTRODUODENOSCOPY N/A 11/01/2020   Procedure: ESOPHAGOGASTRODUODENOSCOPY (EGD);  Surgeon: Vida Rigger, MD;  Location: [facility] ENDOSCOPY;  Service: Endoscopy;  Laterality: N/A;    Current Outpatient Medications  Medication Sig Dispense Refill  . acetaminophen (TYLENOL) 500 MG tablet Take 500 mg by mouth 2 (two) times daily as needed for mild pain.     Marland Kitchen acetaminophen-codeine (TYLENOL #3) 300-30 MG tablet Take 1 tablet by mouth 3 (three) times daily as needed for moderate pain.    Marland Kitchen acetic acid 0.25 % irrigation Irrigate with 1 application as directed See admin instructions. to wash perineum once a week during bathing    . albuterol (VENTOLIN HFA) 108 (90 Base) MCG/ACT inhaler Inhale 1 puff into the lungs every 4 (four) hours as needed for wheezing or shortness of breath.    Marland Kitchen apixaban (ELIQUIS) 5 MG TABS tablet Take 5 mg by mouth 2 (two) times daily.    . benzonatate (TESSALON) 100 MG capsule Take 100 mg by mouth 3 (three) times daily as needed for cough.     Marland Kitchen buPROPion (WELLBUTRIN) 100 MG tablet Take 100 mg by mouth daily.    . cholecalciferol (VITAMIN D3) 25 MCG (1000 UNIT) tablet Take 1,000 Units by mouth daily.    Marland Kitchen diltiazem (CARDIZEM LA) 120 MG 24 hr tablet Take 120 mg by mouth daily.    . ferrous sulfate 325 (65 FE) MG tablet Take 1 tablet (325 mg total) by mouth daily with breakfast. 30 tablet 3  . gemfibrozil (LOPID) 600 MG tablet Take 600 mg by mouth 2 (two) times daily before a meal.    . guaiFENesin (MUCINEX) 600 MG 12 hr tablet Take 600 mg by mouth 2 (two) times daily as needed for cough.    Marland Kitchen ketoconazole (NIZORAL) 2 % shampoo Apply 1 application topically 3 (three) times a week. Let sit for 3 minutes before rinsing.   Tuesday Thursday Saturday    . levothyroxine (SYNTHROID) 50 MCG tablet Take 50 mcg by mouth daily before breakfast.    . magnesium oxide  (MAG-OX) 400 MG tablet Take 1 tablet (400 mg total) by mouth daily. 30 tablet 3  . metFORMIN (GLUCOPHAGE-XR) 500 MG 24 hr tablet Take  1,000 mg by mouth 2 (two) times daily.    . metoprolol succinate (TOPROL XL) 25 MG 24 hr tablet Take 1 tablet (25 mg total) by mouth daily. 30 tablet 3  . Multiple Vitamins-Minerals (OCUVITE ADULT 50+ PO) Take 1 capsule by mouth daily.    Marland Kitchen. nystatin cream (MYCOSTATIN) Apply 1 application topically 3 (three) times daily.    . pantoprazole (PROTONIX) 40 MG tablet Take 1 tablet (40 mg total) by mouth 2 (two) times daily before a meal. 30 tablet 3  . phenylephrine-shark liver oil-mineral oil-petrolatum (PREPARATION H) 0.25-14-74.9 % rectal ointment Place 1 application rectally 4 (four) times daily as needed (pain and bleeding).    . pravastatin (PRAVACHOL) 80 MG tablet Take 80 mg by mouth daily.    . ramipril (ALTACE) 5 MG capsule Take 5 mg by mouth daily.    Marland Kitchen. spironolactone (ALDACTONE) 25 MG tablet Take 0.5 tablets (12.5 mg total) by mouth daily. 90 tablet 1  . torsemide (DEMADEX) 20 MG tablet Take 4 tablets (80 mg total) by mouth daily. 120 tablet 1  . vitamin B-12 (CYANOCOBALAMIN) 1000 MCG tablet Take 1,000 mcg by mouth daily.     No current facility-administered medications for this visit.    Allergies:   Patient has no known allergies.   Social History:  The patient  reports that she has never smoked. She has never used smokeless tobacco. She reports previous alcohol use. She reports that she does not use drugs.   Family History:  The patient's family history includes CAD in her father; Hypertension in her mother; Stroke in her mother.  ROS:  Please see the history of present illness.  All other systems are reviewed and otherwise negative.   PHYSICAL EXAM:  VS:  LMP  (LMP Unknown)  BMI: There is no height or weight on file to calculate BMI. Well nourished, well developed, in no acute distress  HEENT: normocephalic, atraumatic  Neck: no JVD, carotid  bruits or masses Cardiac: RRR; no significant murmurs, no rubs, or gallops Lungs:  CTA b/l, no wheezing, rhonchi or rales  Abd: soft, nontender, obese MS: no deformity, age appropriate atrophy Ext: b/l LE have ACE wraps in place, I can see/feel above them (to just below the knee and are much softer then last visit Skin: warm and dry, no rash Neuro:  No gross deficits appreciated Psych: euthymic mood, full affect  PPM site is stable, no tethering or discomfort   EKG:  Not done today  PPM interrogation done today and reviewed by myself:  Battery is stable presents AS/VS 60's A lead measurements are stable V sensing is 2.0, 2.6, sensing is programmed at 1.0V, impedance and thresholds stable No new arrhthymias AP 27% VP 3%   10/31/2020; TTE IMPRESSIONS  1. Left ventricular ejection fraction, by estimation, is 55 to 60%. The  left ventricle has normal function. The left ventricle has no regional  wall motion abnormalities. There is mild concentric left ventricular  hypertrophy. Left ventricular diastolic  function could not be evaluated.  2. Right ventricular systolic function is normal. The right ventricular  size is normal. There is moderately elevated pulmonary artery systolic  pressure. The estimated right ventricular systolic pressure is 58.8 mmHg.  3. The mitral valve is degenerative. Mild mitral valve regurgitation.  4. Tricuspid valve regurgitation is mild to moderate.  5. The aortic valve is tricuspid. There is mild calcification of the  aortic valve. There is mild thickening of the aortic valve. Aortic valve  regurgitation  is not visualized. Mild aortic valve sclerosis is present,  with no evidence of aortic valve  stenosis.  6. The inferior vena cava is dilated in size with <50% respiratory  variability, suggesting right atrial pressure of 15 mmHg.   Comparison(s): Changes from prior study are noted. EF 55-60%. Moderately  elevated pulmonary pressures.     04/06/2020: TTE IMPRESSIONS  1. Left ventricular ejection fraction, by estimation, is 50%. The left  ventricle has low normal function. Left ventricular endocardial border not  optimally defined to evaluate regional wall motion. The left ventricular  internal cavity size was mildly  dilated. Left ventricular diastolic parameters are indeterminate.  2. Right ventricular systolic function is normal. The right ventricular  size is mildly enlarged. There is mildly elevated pulmonary artery  systolic pressure. The estimated right ventricular systolic pressure is  39.0 mmHg.  3. Left atrial size was mildly dilated.  4. Right atrial size was mildly dilated.  5. The mitral valve is degenerative. Mild mitral valve regurgitation. No  evidence of mitral stenosis.  6. The aortic valve is normal in structure. Aortic valve regurgitation is  not visualized. No aortic stenosis is present.  7. The inferior vena cava is normal in size with greater than 50%  respiratory variability, suggesting right atrial pressure of 3 mmHg.     Recent Labs: 10/30/2020: B Natriuretic Peptide 628.9 10/31/2020: TSH 1.050 11/05/2020: ALT 24; Magnesium 1.5 11/26/2020: BUN 20; Creatinine, Ser 1.24; Hemoglobin 9.4; Platelets 326; Potassium 4.0; Sodium 136  No results found for requested labs within last 8760 hours.   Estimated Creatinine Clearance: 47.1 mL/min (A) (by C-G formula based on SCr of 1.24 mg/dL (H)).   Wt Readings from Last 3 Encounters:  11/26/20 (!) 301 lb 6.4 oz (136.7 kg)  11/14/20 (!) 305 lb 6.4 oz (138.5 kg)  11/08/20 (!) 326 lb 1 oz (147.9 kg)     Other studies reviewed: Additional studies/records reviewed today include: summarized above  ASSESSMENT AND PLAN:  1. CAD     No anginal symptoms     Off plavix, since her hospital stay     On BB, statin and lopid, labs monitored via her PMD  2. PPM    RV sensing issue is known, no programming changes made, no undersensing was noted  while I was with her, only 3% VP     No programming changes made  3. Persistent AFib, flutter     CHA2DS2Vasc is 6, on Eliquis, appropriately dosed     Recent presumed UGIB      4. HTN     Looks OK   5. HLD     Not addressed today  6. HFpEF     She has had some improvement.     Weight is up, but reports objectively that the measurements of her legs by lymphedema clinic staff is down, and both the patient and family report a clear improvement.      No SOB, DOE      No further changes at this time      BMET today      See her back in a month, sooner if needed    Disposition: as above  Current medicines are reviewed at length with the patient today.  The patient did not have any concerns regarding medicines.  Norma Fredrickson, PA-C 12/09/2020 8:32 PM     [facility] [address] [place] [address] [phone] (office)  [phone] (fax)

## 2020-12-10 ENCOUNTER — Ambulatory Visit (INDEPENDENT_AMBULATORY_CARE_PROVIDER_SITE_OTHER): Payer: Medicare Other | Admitting: Physician Assistant

## 2020-12-10 ENCOUNTER — Encounter: Payer: Self-pay | Admitting: Physician Assistant

## 2020-12-10 ENCOUNTER — Other Ambulatory Visit: Payer: Self-pay

## 2020-12-10 VITALS — BP 122/68 | HR 76 | Ht 67.0 in | Wt 303.0 lb

## 2020-12-10 DIAGNOSIS — I480 Paroxysmal atrial fibrillation: Secondary | ICD-10-CM

## 2020-12-10 DIAGNOSIS — I2510 Atherosclerotic heart disease of native coronary artery without angina pectoris: Secondary | ICD-10-CM

## 2020-12-10 DIAGNOSIS — I5030 Unspecified diastolic (congestive) heart failure: Secondary | ICD-10-CM | POA: Diagnosis not present

## 2020-12-10 DIAGNOSIS — I10 Essential (primary) hypertension: Secondary | ICD-10-CM

## 2020-12-10 DIAGNOSIS — Z79899 Other long term (current) drug therapy: Secondary | ICD-10-CM

## 2020-12-10 DIAGNOSIS — Z950 Presence of cardiac pacemaker: Secondary | ICD-10-CM | POA: Diagnosis not present

## 2020-12-10 NOTE — Patient Instructions (Signed)
Medication Instructions:   Your physician recommends that you continue on your current medications as directed. Please refer to the Current Medication list given to you today.  *If you need a refill on your cardiac medications before your next appointment, please call your pharmacy*   Lab Work: BMET TODAY   If you have labs (blood work) drawn today and your tests are completely normal, you will receive your results only by: Marland Kitchen MyChart Message (if you have MyChart) OR . A paper copy in the mail If you have any lab test that is abnormal or we need to change your treatment, we will call you to review the results.   Testing/Procedures: NONE ORDERED  TODAY   Follow-Up: At [facility], you and your health needs are our priority.  As part of our continuing mission to provide you with exceptional heart care, we have created designated Provider Care Teams.  These Care Teams include your primary Cardiologist (physician) and Advanced Practice Providers (APPs -  Physician Assistants and Nurse Practitioners) who all work together to provide you with the care you need, when you need it.  We recommend signing up for the patient portal called "MyChart".  Sign up information is provided on this After Visit Summary.  MyChart is used to connect with patients for Virtual Visits (Telemedicine).  Patients are able to view lab/test results, encounter notes, upcoming appointments, etc.  Non-urgent messages can be sent to your provider as well.   To learn more about what you can do with MyChart, go to [email or web address].    Your next appointment:   1 month(s)  The format for your next appointment:   In Person  Provider:   Sherryl Manges, MD   Other Instructions  PLEASE ELEVATE YOUR FEET WHILE SITTING

## 2020-12-11 LAB — BASIC METABOLIC PANEL
BUN/Creatinine Ratio: 22 (ref 12–28)
BUN: 26 mg/dL (ref 8–27)
CO2: 25 mmol/L (ref 20–29)
Calcium: 9.1 mg/dL (ref 8.7–10.3)
Chloride: 93 mmol/L — ABNORMAL LOW (ref 96–106)
Creatinine, Ser: 1.16 mg/dL — ABNORMAL HIGH (ref 0.57–1.00)
GFR calc Af Amer: 49 mL/min/{1.73_m2} — ABNORMAL LOW (ref >59)
GFR calc non Af Amer: 43 mL/min/{1.73_m2} — ABNORMAL LOW (ref >59)
Glucose: 424 mg/dL — ABNORMAL HIGH (ref 65–99)
Potassium: 4.4 mmol/L (ref 3.5–5.2)
Sodium: 134 mmol/L (ref 134–144)

## 2020-12-23 NOTE — Progress Notes (Signed)
Remote pacemaker transmission.   

## 2021-01-01 ENCOUNTER — Telehealth: Payer: Self-pay | Admitting: Physician Assistant

## 2021-01-01 NOTE — Telephone Encounter (Signed)
    Pt c/o medication issue:  1. Name of Medication: metoprolol succinate (TOPROL XL) 25 MG 24 hr tablet  diltiazem (CARDIZEM LA) 120 MG 24 hr tablet    2. How are you currently taking this medication (dosage and times per day)?   3. Are you having a reaction (difficulty breathing--STAT)?   4. What is your medication issue? Pt said her systolic is in the 50s and wondering if her meds needs to change

## 2021-01-01 NOTE — Telephone Encounter (Addendum)
Spoke with pt who states her B/P's have been running low.  Pt resides at [facility] and her BP is taken daily.  She is uncertain of her readings but states she believes her diastolic BP has been in the 50's.  Pt reports also having low BS's and chills without fever.  Pt reports her weight is down to 285.  Pt advised it is very important she f/u with her PCP and discuss with staff at [facility].  Pt will have facility log her last 5 BP readings and will notify office if readings are abnormal.  Pt is due to see Dr Graciela Husbands 02/03/2021 and does not see general cards.  Pt verbalizes understanding and agrees with current plan.

## 2021-02-02 ENCOUNTER — Telehealth: Payer: Self-pay | Admitting: Internal Medicine

## 2021-02-02 NOTE — Telephone Encounter (Signed)
Pt c/o medication issue:  1. Name of Medication: torsemide (DEMADEX) 20 MG tablet  2. How are you currently taking this medication (dosage and times per day)? 4 tablets daily  3. Are you having a reaction (difficulty breathing--STAT)? no  4. What is your medication issue? Patient states the medication needs to be updated to her Assisted Living, [facility]. She states they have the prescription as 1 tablet daily and have only been giving her 1 tablet. She states she does not have their phone number. She would also like a call back to let her know whether she should take 3 more tablets today. Please advise.

## 2021-02-02 NOTE — Telephone Encounter (Signed)
Follow up:      The nurse calling concering patient refill stating it is supposed to 80 mg instead of 20 mg.  Pura Spice [phone]

## 2021-02-03 ENCOUNTER — Encounter: Payer: Medicare Other | Admitting: Physician Assistant

## 2021-02-03 MED ORDER — TORSEMIDE 20 MG PO TABS: 80.0000 mg | ORAL_TABLET | Freq: Every day | ORAL | 10 refills | Status: DC

## 2021-02-03 NOTE — Telephone Encounter (Signed)
Pt's medication was sent to pt's pharmacy as requested. Confirmation received.  °

## 2021-02-05 ENCOUNTER — Other Ambulatory Visit: Payer: Self-pay | Admitting: Internal Medicine

## 2021-02-05 DIAGNOSIS — Z13820 Encounter for screening for osteoporosis: Secondary | ICD-10-CM

## 2021-02-05 DIAGNOSIS — Z1231 Encounter for screening mammogram for malignant neoplasm of breast: Secondary | ICD-10-CM

## 2021-02-15 NOTE — Progress Notes (Signed)
Cardiology Office Note Date:  02/15/2021  Patient ID:  Kirsten Jensen, DOB 01/26/1932, MRN [id number] PCP:  Melida QuitterWile, Laura H, MD  Cardiologist/EP:  Dr. Graciela Husbandsklein    Chief Complaint:  planned f/u  History of Present Illness: Kirsten Jensen is a 87 y.o. female with history of CAD (PCI approx 5 years ago in [place]), AFib, flutter, tachy-brady w/PPM, HTN, DM, OSA (w/CPAP).  He comes in today to be seen for Dr. Graciela HusbandsKlein.  Last seen by him 03/18/2020. Per that office visit note, "Undertook rapid atrial pacing with termination of her atrial flutter. We will follow for recurrence and then make a decision regarding antiarrhythmic therapy and/or catheter ablation of the AV node"  She was admitted to [facility] 04[facility]/20  w/CP, EKG showed rate controlled atrial flutter with LVH and RBBB. High-sensitivity troponin minimally elevated and flat at 22 >> 24 >> 22. Chest x-ray showed no acute findings. WBC 9.5, Hgb 11.6, Plts 371. Na 140, K 4.1, Glucose 157, Creatinine 1.05. COVID-19 negative Cardiology, Dr. Jacques NavyAcharya consulted, CP was felt atypical, planned for echo, noted with low normal EF 50% and RWMA in the distribution of perfusion defects seen on prior stress testing. Suggests no change over time.  - consider outpatient stress test, Continued on OAC, was rate controlled, dilt/home meds continued Discharged 04/06/20   I saw her May 2021 She feels well!  She is a resident at [facility] 2/2 worsening macular degeneration.  She feels like the care is slow and unpredictable, and feels like she is a good patient advocate for the other residents that are less able to verbalize or get help/attention.  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 The night she went to the hospital she had eaten dinner and once back to her room felt a central chest pressure,  she felt it was probably not her heart, more her breast bone, but it stayed a while and she was worried about being the whole night there with it, so went tot he hospital.  It seemed to self resolve and has not had it again. She denies any overt palpitations.  She is infrequently aware of a irregular heart beat.  No dizzy spells, near syncope or syncope, no bleeding or signs of bleeding In review of the week or 2 of SR that she had, she does not think she could tell the difference.  And in comparison to a month ago or longer, she feels the best now that she has in quite a long time with improving exertional capacity and endurance. Given feeling well and no clear symptom improvement when she was in SR, no changes were made, and was rate controlled.    Nov 2021 hospitalized with GIB, received 1u PRBC, GI was consulted the patient underwent EGD and was found to have a nonbleeding ulcer, underwent colonoscopy as that showed diverticulosis and 2 small polyps which were removed. CT scan of the abdomen and pelvis was unremarkable Discharged 11/08/20  TTE noted LVEF 55-60%, mild conc LVH, RV OK, EVSP 58.238mmHg, mild-mod TR  She saw Dr. Graciela HusbandsKlein 11/14/2020, notes that after about a year of AFlutter had spontaneously reverted back to SR.  Was volume OL her torsemide increased from 40>60mg  daily, planned f/u 2-3 weeks to revisit her volume/response to diuresis. Also noted RV lead with progressive loss  of sensing of her RV lead.  We have reprogrammed a bipolar 1.0 mV; she is sensing adequately.  The lead also works quite well unipolar.  There have been  A number of phone notes reporting no appreciable improvement/change > 80mg  daily > initial improvement though leveled off and apparently gaining again..  I saw her 11/26/20 She is accompanied by her daughter in law, continues to reside at 11/28/20. She voices much concern states that that she is not getting all of her medicines all of the time.  She has no  diet options for reduced sodium And thinks that this is contributing to slow progress, and perhaps part of the problem.  She feels like she has gotten some improvement in her swelling, they are not as tight and painfully swollen like they had been.  She has not noticed any particular or clear up tick in urinary frequency/amount She is not SOB, not at rest, no symptoms of orthopnea or PND.  She also denies DOE, though says she is generally weaker, and feels like her strength to walk as much is less then it had been previously, but specifically not because of SOB, but generalized weakness.  No CP, no near syncope or syncope, no palpitations  She says that she seems to keep gaining and losing the same few pounds since her last visit, though also reports that her weight at home a couple days after seeing Dr. [employer], her weight peaked at 315lbs, this morning was 297.  She says this has been AM weights.  A copy of her MAR is sent with her and reviewed with the patient/daughter in law It seems that she was already getting Torsemide 60mg  daily when she saw [name], by their record, and was increased to 80mg  daily on 11/17/20. It looks like perhaps did not get any on the 12th or 14th Her K+ was a one time 5 day course of tx that ended 11/16/20.   She has questions about her hospital stay and cause of her anemia, and thought to that we would be discussing this today, I mentioned that was presumptive UGIB, though no active bleeding at the time of hr procedures noted, though deferred specifics otherwise and management to her PMD  I was not convinced at the time that Dr. 13 made an adjustment to her diuretics that this was actually a change, seemed her MAR noted same dose already? The pt was fairly certain she was not getting all of her meds, MAR reviewed and did not some missed doses. Dr. 15 in chart message had suggested consideration for zaroxolyn, though given she seemed to have not gotten all the  diuresis we thought she had, I opted to add spironolactone. Daily Requested daily weights, low sodium diet availability and asked that med tech verbally communicated with the patient what meds she is getting when dispensed (she is legally blind) Planned for close follow up  I saw her 12/10/20 She is again accompanied by her daughter in law. She is still not convinced that she is getting her medicines all of the time, but does rport improvement in her swelling. She says she is being seen now by a lymphedema clinic nurse and is getting wraps done and at the last visit the measurements of her legs were indeed smaller. She is able to get her feet into her shoes now easily and had been unable to at the time of her last visit. Her daughter in law agrees, there is clear improvement,. That  being said her weight is up, here and by her daily weights at the SNF. Her daughter in law reports that she has been liberal with her eating. The patient though feels OK, she is quite sleepy today and says she was up watching movies until well past 2AM this morning.  She denies any CP, palpitations, no cardiac awareness. She sleeps with CPAP and denies any nocturnal SOB/symptoms No rest SOB, she has some baseline DOE that she says is unchanged for a very long time.  No dizzy spells, near syncope or syncope. No bleeding or signs of bleeding  Her weight was up some, though legs smaller by measurement at the lymph clinic, no changes were made, labs updated and planned to f/u in a month  TODAY She looks better! She reports outside of being unhappy with her care at [facility] this having an emotional drain on her, she is doing better. They have looked at another facility but she did not feel comfortable with that one, hopefully they will be able to find someplace that she is comfortable and feels well cared for. She denies SOB at rest or with regular paced activities, no symptoms of orthopnea or PND. No CP,  palpitations. No dizzy spells, near syncoe or syncope. No bleeding or signs of bleeding that she is aware of.  Lymphedema RN continues to see her and wrap her legs was there this AM, again reports that objectively seemed to increase slightly several weeks ago, but again reducing in size slowly but surely.  Device information BSCi dual chamber PPM implanted 09/14/2018 Known low RV sensing   Past Medical History:  Diagnosis Date  . Atrial fibrillation (HCC)   . Coronary artery disease    stenting x 2  . Diabetes (HCC)   . Dyslipidemia 04/05/2020  . Hypertension   . Morbid obesity (HCC)   . Pacemaker   . Paroxysmal atrial fibrillation (HCC) 11/06/2019  . Secondary hypercoagulable state (HCC) 11/06/2019  . T2DM (type 2 diabetes mellitus) (HCC) 04/05/2020  . Tachycardia-bradycardia syndrome (HCC) 03/17/2020    Past Surgical History:  Procedure Laterality Date  . COLONOSCOPY N/A 11/02/2020   Procedure: COLONOSCOPY;  Surgeon: Kirsten Rigger, MD;  Location: [facility] ENDOSCOPY;  Service: Endoscopy;  Laterality: N/A;  . ESOPHAGOGASTRODUODENOSCOPY N/A 11/01/2020   Procedure: ESOPHAGOGASTRODUODENOSCOPY (EGD);  Surgeon: Kirsten Rigger, MD;  Location: [facility] ENDOSCOPY;  Service: Endoscopy;  Laterality: N/A;    Current Outpatient Medications  Medication Sig Dispense Refill  . acetaminophen (TYLENOL) 500 MG tablet Take 500 mg by mouth 2 (two) times daily as needed for mild pain.     Marland Kitchen acetaminophen-codeine (TYLENOL #3) 300-30 MG tablet Take 1 tablet by mouth 3 (three) times daily as needed for moderate pain.    Marland Kitchen albuterol (VENTOLIN HFA) 108 (90 Base) MCG/ACT inhaler Inhale 1 puff into the lungs every 4 (four) hours as needed for wheezing or shortness of breath.    Marland Kitchen apixaban (ELIQUIS) 5 MG TABS tablet Take 5 mg by mouth 2 (two) times daily.    . benzonatate (TESSALON) 100 MG capsule Take 100 mg by mouth 3 (three) times daily as needed for cough.     Marland Kitchen buPROPion (WELLBUTRIN) 100 MG tablet Take 100 mg by mouth  daily.    . cholecalciferol (VITAMIN D3) 25 MCG (1000 UNIT) tablet Take 1,000 Units by mouth daily.    Marland Kitchen diltiazem (CARDIZEM LA) 120 MG 24 hr tablet Take 120 mg by mouth daily.    . ferrous sulfate 325 (65 FE)  MG tablet Take 1 tablet (325 mg total) by mouth daily with breakfast. 30 tablet 3  . gemfibrozil (LOPID) 600 MG tablet Take 600 mg by mouth 2 (two) times daily before a meal.    . guaiFENesin (MUCINEX) 600 MG 12 hr tablet Take 600 mg by mouth 2 (two) times daily as needed for cough.    Marland Kitchen ketoconazole (NIZORAL) 2 % shampoo Apply 1 application topically 3 (three) times a week. Let sit for 3 minutes before rinsing.   Tuesday Thursday Saturday    . levothyroxine (SYNTHROID) 50 MCG tablet Take 50 mcg by mouth daily before breakfast.    . magnesium oxide (MAG-OX) 400 MG tablet Take 1 tablet (400 mg total) by mouth daily. 30 tablet 3  . metFORMIN (GLUCOPHAGE-XR) 500 MG 24 hr tablet Take 500 mg by mouth 2 (two) times daily.    . metoprolol succinate (TOPROL XL) 25 MG 24 hr tablet Take 1 tablet (25 mg total) by mouth daily. 30 tablet 3  . Multiple Vitamins-Minerals (OCUVITE ADULT 50+ PO) Take 1 capsule by mouth daily.    . pantoprazole (PROTONIX) 40 MG tablet Take 1 tablet (40 mg total) by mouth 2 (two) times daily before a meal. 30 tablet 3  . phenylephrine-shark liver oil-mineral oil-petrolatum (PREPARATION H) 0.25-14-74.9 % rectal ointment Place 1 application rectally 4 (four) times daily as needed (pain and bleeding).    . potassium chloride SA (KLOR-CON) 20 MEQ tablet Take 20 mEq by mouth 2 (two) times daily.    . pravastatin (PRAVACHOL) 80 MG tablet Take 80 mg by mouth daily.    . ramipril (ALTACE) 5 MG capsule Take 5 mg by mouth daily.    Marland Kitchen spironolactone (ALDACTONE) 25 MG tablet Take 0.5 tablets (12.5 mg total) by mouth daily. 90 tablet 1  . torsemide (DEMADEX) 20 MG tablet Take 4 tablets (80 mg total) by mouth daily. 120 tablet 10  . vitamin B-12 (CYANOCOBALAMIN) 1000 MCG tablet Take 1,000  mcg by mouth daily.     No current facility-administered medications for this visit.    Allergies:   Patient has no known allergies.   Social History:  The patient  reports that she has never smoked. She has never used smokeless tobacco. She reports previous alcohol use. She reports that she does not use drugs.   Family History:  The patient's family history includes CAD in her father; Hypertension in her mother; Stroke in her mother.  ROS:  Please see the history of present illness.  All other systems are reviewed and otherwise negative.   PHYSICAL EXAM:  VS:  LMP  (LMP Unknown)  BMI: There is no height or weight on file to calculate BMI. Well nourished, well developed, in no acute distress  HEENT: normocephalic, atraumatic  Neck: no JVD, carotid bruits or masses Cardiac: RRR; no significant murmurs, no rubs, or gallops Lungs:  CTA b/l, no wheezing, rhonchi or rales  Abd: soft, nontender, obese MS: no deformity, age appropriate atrophy Ext: b/l LE have ACE wraps in place, I can see/feel though they are soft, minimal pitting at her feet Skin: warm and dry, no rash Neuro:  No gross deficits appreciated Psych: euthymic mood, full affect  PPM site is stable, no tethering or discomfort   EKG:  Not done today  PPM interrogation done today and reviewed by myself:  Battery is stable presents AS/VS 60's A lead measurements are stable V sensing is 1.7-2.3, sensing is programmed at 1.0V, impedance and thresholds stable 2 very  brief PAF AP 77% VP 28%   10/31/2020; TTE IMPRESSIONS  1. Left ventricular ejection fraction, by estimation, is 55 to 60%. The  left ventricle has normal function. The left ventricle has no regional  wall motion abnormalities. There is mild concentric left ventricular  hypertrophy. Left ventricular diastolic  function could not be evaluated.  2. Right ventricular systolic function is normal. The right ventricular  size is normal. There is moderately  elevated pulmonary artery systolic  pressure. The estimated right ventricular systolic pressure is 58.8 mmHg.  3. The mitral valve is degenerative. Mild mitral valve regurgitation.  4. Tricuspid valve regurgitation is mild to moderate.  5. The aortic valve is tricuspid. There is mild calcification of the  aortic valve. There is mild thickening of the aortic valve. Aortic valve  regurgitation is not visualized. Mild aortic valve sclerosis is present,  with no evidence of aortic valve  stenosis.  6. The inferior vena cava is dilated in size with <50% respiratory  variability, suggesting right atrial pressure of 15 mmHg.   Comparison(s): Changes from prior study are noted. EF 55-60%. Moderately  elevated pulmonary pressures.    04/06/2020: TTE IMPRESSIONS  1. Left ventricular ejection fraction, by estimation, is 50%. The left  ventricle has low normal function. Left ventricular endocardial border not  optimally defined to evaluate regional wall motion. The left ventricular  internal cavity size was mildly  dilated. Left ventricular diastolic parameters are indeterminate.  2. Right ventricular systolic function is normal. The right ventricular  size is mildly enlarged. There is mildly elevated pulmonary artery  systolic pressure. The estimated right ventricular systolic pressure is  39.0 mmHg.  3. Left atrial size was mildly dilated.  4. Right atrial size was mildly dilated.  5. The mitral valve is degenerative. Mild mitral valve regurgitation. No  evidence of mitral stenosis.  6. The aortic valve is normal in structure. Aortic valve regurgitation is  not visualized. No aortic stenosis is present.  7. The inferior vena cava is normal in size with greater than 50%  respiratory variability, suggesting right atrial pressure of 3 mmHg.     Recent Labs: 10/30/2020: B Natriuretic Peptide 628.9 10/31/2020: TSH 1.050 11/05/2020: ALT 24; Magnesium 1.5 11/26/2020: Hemoglobin 9.4;  Platelets 326 12/10/2020: BUN 26; Creatinine, Ser 1.16; Potassium 4.4; Sodium 134  No results found for requested labs within last 8760 hours.   CrCl cannot be calculated (Patient's most recent lab result is older than the maximum 21 days allowed.).   Wt Readings from Last 3 Encounters:  12/10/20 (!) 303 lb (137.4 kg)  11/26/20 (!) 301 lb 6.4 oz (136.7 kg)  11/14/20 (!) 305 lb 6.4 oz (138.5 kg)     Other studies reviewed: Additional studies/records reviewed today include: summarized above  ASSESSMENT AND PLAN:  1. CAD     No anginal symptoms     On BB, statin and lopid, labs monitored via her PMD  2. PPM    RV sensing issue is known, no programming changes made, no undersensing was noted while I was with her     No programming changes made  3. Persistent AFib, flutter     CHA2DS2Vasc is 6, on Eliquis, appropriately dosed     Labs today      4. HTN     Looks OK   5. HLD     Not addressed today  6. HFpEF     Continues to have improved edema     No SOB, DOE  Weight by our scale is down 13lbs     Weights on MAR are stable for the 3 days of March this paperwork notes at 281lbs     No further changes at this time     BMET today, may decided to reduce her Torsemide, await labs    Disposition: she was hoping to see [name] today, will ask our scheduler to find an available slot with him in the next 4mo or so.  Current medicines are reviewed at length with the patient today.  The patient did not have any concerns regarding medicines.  Norma Fredrickson, PA-C 02/15/2021 1:44 PM     [facility] [address] [place] [address] [phone] (office)  [phone] (fax)

## 2021-02-17 ENCOUNTER — Other Ambulatory Visit: Payer: Self-pay

## 2021-02-17 ENCOUNTER — Ambulatory Visit (INDEPENDENT_AMBULATORY_CARE_PROVIDER_SITE_OTHER): Payer: Medicare Other | Admitting: Physician Assistant

## 2021-02-17 ENCOUNTER — Encounter: Payer: Self-pay | Admitting: Physician Assistant

## 2021-02-17 VITALS — BP 110/72 | HR 65 | Ht 67.0 in | Wt 290.0 lb

## 2021-02-17 DIAGNOSIS — Z79899 Other long term (current) drug therapy: Secondary | ICD-10-CM

## 2021-02-17 DIAGNOSIS — I4819 Other persistent atrial fibrillation: Secondary | ICD-10-CM | POA: Diagnosis not present

## 2021-02-17 DIAGNOSIS — I5030 Unspecified diastolic (congestive) heart failure: Secondary | ICD-10-CM | POA: Diagnosis not present

## 2021-02-17 DIAGNOSIS — Z950 Presence of cardiac pacemaker: Secondary | ICD-10-CM

## 2021-02-17 DIAGNOSIS — I5021 Acute systolic (congestive) heart failure: Secondary | ICD-10-CM | POA: Diagnosis not present

## 2021-02-17 DIAGNOSIS — I2510 Atherosclerotic heart disease of native coronary artery without angina pectoris: Secondary | ICD-10-CM

## 2021-02-17 NOTE — Patient Instructions (Addendum)
Medication Instructions:   Your physician recommends that you continue on your current medications as directed. Please refer to the Current Medication list given to you today.   *If you need a refill on your cardiac medications before your next appointment, please call your pharmacy*   Lab Work: BMET CBC AND HGA1C   If you have labs (blood work) drawn today and your tests are completely normal, you will receive your results only by: Marland Kitchen MyChart Message (if you have MyChart) OR . A paper copy in the mail If you have any lab test that is abnormal or we need to change your treatment, we will call you to review the results.   Testing/Procedures: NONE ORDERED  TODAY     Follow-Up: At [facility], you and your health needs are our priority.  As part of our continuing mission to provide you with exceptional heart care, we have created designated Provider Care Teams.  These Care Teams include your primary Cardiologist (physician) and Advanced Practice Providers (APPs -  Physician Assistants and Nurse Practitioners) who all work together to provide you with the care you need, when you need it.  We recommend signing up for the patient portal called "MyChart".  Sign up information is provided on this After Visit Summary.  MyChart is used to connect with patients for Virtual Visits (Telemedicine).  Patients are able to view lab/test results, encounter notes, upcoming appointments, etc.  Non-urgent messages can be sent to your provider as well.   To learn more about what you can do with MyChart, go to [email or web address].    Your next appointment:   6 week(s)  The format for your next appointment:   In Person  Provider:   Sherryl Manges, MD     Other Instructions

## 2021-02-18 LAB — BASIC METABOLIC PANEL
BUN/Creatinine Ratio: 22 (ref 12–28)
BUN: 36 mg/dL — ABNORMAL HIGH (ref 8–27)
CO2: 23 mmol/L (ref 20–29)
Calcium: 9.6 mg/dL (ref 8.7–10.3)
Chloride: 97 mmol/L (ref 96–106)
Creatinine, Ser: 1.61 mg/dL — ABNORMAL HIGH (ref 0.57–1.00)
Glucose: 145 mg/dL — ABNORMAL HIGH (ref 65–99)
Potassium: 4.9 mmol/L (ref 3.5–5.2)
Sodium: 139 mmol/L (ref 134–144)
eGFR: 31 mL/min/{1.73_m2} — ABNORMAL LOW (ref >59)

## 2021-02-18 LAB — CBC
Hematocrit: 29 % — ABNORMAL LOW (ref 34.0–46.6)
Hemoglobin: 9.3 g/dL — ABNORMAL LOW (ref 11.1–15.9)
MCH: 30 pg (ref 26.6–33.0)
MCHC: 32.1 g/dL (ref 31.5–35.7)
MCV: 94 fL (ref 79–97)
Platelets: 271 10*3/uL (ref 150–450)
RBC: 3.1 x10E6/uL — ABNORMAL LOW (ref 3.77–5.28)
RDW: 14.8 % (ref 11.7–15.4)
WBC: 7.8 10*3/uL (ref 3.4–10.8)

## 2021-02-18 LAB — HEMOGLOBIN A1C
Est. average glucose Bld gHb Est-mCnc: 151 mg/dL
Hgb A1c MFr Bld: 6.9 % — ABNORMAL HIGH (ref 4.8–5.6)

## 2021-02-19 ENCOUNTER — Telehealth: Payer: Self-pay | Admitting: *Deleted

## 2021-02-19 NOTE — Telephone Encounter (Signed)
Lvm on Nurse at [facility] to contact clinic about results and recommendations

## 2021-02-20 NOTE — Telephone Encounter (Signed)
Patient is following up to discuss her lab results. She is hoping someone can call her back this afternoon. She states she is extremely anxious.

## 2021-02-24 NOTE — Telephone Encounter (Signed)
Patient returning call from [facility] to discuss results.  Patient also wanted to let us know that the office that needs the results did not get a fax from our office yet.

## 2021-03-10 ENCOUNTER — Ambulatory Visit (INDEPENDENT_AMBULATORY_CARE_PROVIDER_SITE_OTHER): Payer: Medicare Other

## 2021-03-10 DIAGNOSIS — I495 Sick sinus syndrome: Secondary | ICD-10-CM

## 2021-03-10 LAB — CUP PACEART REMOTE DEVICE CHECK
Battery Remaining Longevity: 72 mo
Battery Remaining Percentage: 100 %
Brady Statistic RA Percent Paced: 94 %
Brady Statistic RV Percent Paced: 36 %
Date Time Interrogation Session: 20220329011000
Implantable Lead Implant Date: 20191003
Implantable Lead Implant Date: 20191003
Implantable Lead Location: 753859
Implantable Lead Location: 753860
Implantable Lead Model: 7741
Implantable Lead Model: 7742
Implantable Lead Serial Number: 1047491
Implantable Lead Serial Number: 1074664
Implantable Pulse Generator Implant Date: 20191003
Lead Channel Impedance Value: 718 Ohm
Lead Channel Impedance Value: 747 Ohm
Lead Channel Pacing Threshold Amplitude: 1.1 V
Lead Channel Pacing Threshold Pulse Width: 0.4 ms
Lead Channel Setting Pacing Amplitude: 2 V
Lead Channel Setting Pacing Amplitude: 2.5 V
Lead Channel Setting Pacing Pulse Width: 0.4 ms
Lead Channel Setting Sensing Sensitivity: 1 mV
Pulse Gen Serial Number: 444544

## 2021-03-19 ENCOUNTER — Ambulatory Visit (INDEPENDENT_AMBULATORY_CARE_PROVIDER_SITE_OTHER): Payer: Medicare Other | Admitting: Neurology

## 2021-03-19 ENCOUNTER — Telehealth: Payer: Self-pay | Admitting: *Deleted

## 2021-03-19 ENCOUNTER — Encounter: Payer: Self-pay | Admitting: Neurology

## 2021-03-19 VITALS — BP 128/68 | HR 60 | Ht 67.0 in | Wt 283.3 lb

## 2021-03-19 DIAGNOSIS — G4733 Obstructive sleep apnea (adult) (pediatric): Secondary | ICD-10-CM | POA: Diagnosis not present

## 2021-03-19 DIAGNOSIS — R351 Nocturia: Secondary | ICD-10-CM | POA: Diagnosis not present

## 2021-03-19 DIAGNOSIS — I480 Paroxysmal atrial fibrillation: Secondary | ICD-10-CM

## 2021-03-19 DIAGNOSIS — G4719 Other hypersomnia: Secondary | ICD-10-CM | POA: Diagnosis not present

## 2021-03-19 DIAGNOSIS — I2510 Atherosclerotic heart disease of native coronary artery without angina pectoris: Secondary | ICD-10-CM

## 2021-03-19 NOTE — Progress Notes (Addendum)
Subjective:    Patient ID: Kirsten Jensen is a 87 y.o. female.  HPI     Huston Foley, MD, PhD [facility] [address], [address] [address] [place], [place] [address]  Dear Dr. Nadene Rubins,   I saw your patient, Kirsten Jensen, upon your kind request in my sleep clinic today for initial consultation of her sleep disorder, in particular, evaluation of her prior diagnosis of obstructive sleep apnea.  The patient is unaccompanied today and came via transportation from her assisted living facility, [facility]. As you know, [name] is an 87 year old right-handed woman with an underlying complex medical history of diabetes, tachycardia-bradycardia syndrome, status post pacemaker insertion, history of atrial fibrillation, coronary artery disease, hypertension, hyperlipidemia, macular degeneration with legal blindness, and morbid obesity with a BMI of over 45, who was previously diagnosed with obstructive sleep apnea and placed on CPAP therapy.  I reviewed your office note and phone note. Prior sleep study results are not available for my review today.  She reports testing was over 15 years ago when she was still residing in [place].  She has had more than one CPAP machine and this particular machine she has is about 63-year-old.  She has a set up date on the download of 04/19/2018.  I reviewed her last 30 days on the CPAP download and she has only used her machine for days.  She needs new supplies, she has been using a medium Respironics fullface mask but the mask broke.  She has not established with a local DME company here, prior company was [employer], an Production designer, theatre/television/film company now.  Her pressure is set at 14 cm.  She has a Tree surgeon.  She has an average AHI of 13.3/h.  She was not aware of the recall on the [employer].  She has not registered her machine or talk to her DME company about getting a replacement as she was not aware of the recall.   She is willing to register her machine.  She generally has a variable sleep schedule, tries to be in bed somewhere between 9 PM and midnight and rise time varies as well, she takes her Synthroid around 7 AM.  She does have nocturia at least once or twice per average night.  She is a non-smoker.  She drinks caffeine in the form of coffee, 1 cup/day in the mornings, about 10 ounces.  She has not had alcohol in about 40 years.  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  She moved from [place] in June 2021 to be closer to her son.  She has 2 grandchildren from him, grandson is about 43 years old and granddaughter is 7.   Of note, she was hospitalized in November 2021 from 10/30/2020 through 11/08/2020, due to acute anemia, secondary to GI bleed, complicated by acute on chronic diastolic congestive heart failure, acute on chronic kidney injury.  Her Past Medical History Is Significant For: Past Medical History:  Diagnosis Date  . Atrial fibrillation (HCC)   . Coronary artery disease    stenting x 2  . Diabetes (HCC)   . Dyslipidemia 04/05/2020  . Hypertension   . Morbid obesity (HCC)   . Pacemaker   . Paroxysmal atrial fibrillation (HCC) 11/06/2019  . Secondary hypercoagulable state (HCC) 11/06/2019  . T2DM (type 2 diabetes mellitus) (HCC) 04/05/2020  . Tachycardia-bradycardia syndrome (HCC) 03/17/2020    Her Past Surgical History Is Significant For: Past  Surgical History:  Procedure Laterality Date  . COLONOSCOPY N/A 11/02/2020   Procedure: COLONOSCOPY;  Surgeon: Vida RiggerMagod, Marc, MD;  [facility];  Service: Endoscopy;  Laterality: N/A;  . ESOPHAGOGASTRODUODENOSCOPY N/A 11/01/2020   Procedure: ESOPHAGOGASTRODUODENOSCOPY (EGD);  Surgeon: Vida RiggerMagod, Marc, MD;  [facility];  Service: Endoscopy;  Laterality: N/A;    Her Family History Is Significant For: Family History  Problem Relation Age of Onset  . Hypertension Mother   . Stroke Mother        died at  age 85  . CAD Father        MI at age of 85    Her Social History Is Significant For: Social History   Socioeconomic History  . Marital status: Widowed    Spouse name: Not on file  . Number of children: Not on file  . Years of education: Not on file  . Highest education level: Not on file  Occupational History  . Not on file  Tobacco Use  . Smoking status: Never Smoker  . Smokeless tobacco: Never Used  Vaping Use  . Vaping Use: Never used  Substance and Sexual Activity  . Alcohol use: Not Currently  . Drug use: Never  . Sexual activity: Not on file  Other Topics Concern  . Not on file  Social History Narrative  . Not on file   Social Determinants of Health   Financial Resource Strain: Not on file  Food Insecurity: Not on file  Transportation Needs: Not on file  Physical Activity: Not on file  Stress: Not on file  Social Connections: Not on file    Her Allergies Are:  No Known Allergies:   Her Current Medications Are:  Outpatient Encounter Medications as of 03/19/2021  Medication Sig  . acetaminophen (TYLENOL) 500 MG tablet Take 500 mg by mouth 2 (two) times daily as needed for mild pain.   Marland Kitchen. acetaminophen-codeine (TYLENOL #3) 300-30 MG tablet Take 1 tablet by mouth 3 (three) times daily as needed for moderate pain.  Marland Kitchen. apixaban (ELIQUIS) 5 MG TABS tablet Take 5 mg by mouth 2 (two) times daily.  Marland Kitchen. buPROPion (WELLBUTRIN) 100 MG tablet Take 100 mg by mouth daily.  . cholecalciferol (VITAMIN D3) 25 MCG (1000 UNIT) tablet Take 1,000 Units by mouth daily.  Marland Kitchen. diltiazem (CARDIZEM LA) 120 MG 24 hr tablet Take 120 mg by mouth daily.  . ferrous sulfate 325 (65 FE) MG tablet Take 1 tablet (325 mg total) by mouth daily with breakfast.  . gemfibrozil (LOPID) 600 MG tablet Take 600 mg by mouth 2 (two) times daily before a meal.  . glimepiride (AMARYL) 2 MG tablet Take 2 mg by mouth daily.  Marland Kitchen. guaiFENesin (MUCINEX) 600 MG 12 hr tablet Take 600 mg by mouth 2 (two) times daily as  needed for cough.  Marland Kitchen. ketoconazole (NIZORAL) 2 % shampoo Apply 1 application topically 3 (three) times a week. Let sit for 3 minutes before rinsing.   Tuesday Thursday Saturday  . levothyroxine (SYNTHROID) 50 MCG tablet Take 50 mcg by mouth daily before breakfast.  . magnesium oxide (MAG-OX) 400 MG tablet Take 1 tablet (400 mg total) by mouth daily.  . metFORMIN (GLUCOPHAGE-XR) 500 MG 24 hr tablet Take 500 mg by mouth 2 (two) times daily.  . metoprolol succinate (TOPROL XL) 25 MG 24 hr tablet Take 1 tablet (25 mg total) by mouth daily.  . Multiple Vitamins-Minerals (OCUVITE ADULT 50+ PO) Take 1 capsule by mouth daily.  . pantoprazole (PROTONIX)  40 MG tablet Take 1 tablet (40 mg total) by mouth 2 (two) times daily before a meal.  . potassium chloride SA (KLOR-CON) 20 MEQ tablet Take 20 mEq by mouth 2 (two) times daily.  . pravastatin (PRAVACHOL) 80 MG tablet Take 80 mg by mouth daily.  . ramipril (ALTACE) 5 MG capsule Take 5 mg by mouth daily.  Marland Kitchen spironolactone (ALDACTONE) 25 MG tablet Take 0.5 tablets (12.5 mg total) by mouth daily.  Marland Kitchen torsemide (DEMADEX) 20 MG tablet Take 4 tablets (80 mg total) by mouth daily.  . vitamin B-12 (CYANOCOBALAMIN) 1000 MCG tablet Take 1,000 mcg by mouth daily.   No facility-administered encounter medications on file as of 03/19/2021.  :   Review of Systems:  Out of a complete 14 point review of systems, all are reviewed and negative with the exception of these symptoms as listed below:  Review of Systems  Neurological:       Here for sleep consult. Prior sleep study was 15+ years ago along with starting machine.  Pt reports supplies are dated and not working properly.   Marland KitchenEpworth Sleepiness Scale 0= would never doze 1= slight chance of dozing 2= moderate chance of dozing 3= high chance of dozing  Sitting and reading: 0 Watching TV:3 Sitting inactive in a public place (ex. Theater or meeting):0 As a passenger in a car for an hour without a  break:0 Lying down to rest in the afternoon:3 Sitting and talking to someone:1 Sitting quietly after lunch (no alcohol):3 In a car, while stopped in traffic:0 Total:10     Objective:  Neurological Exam  Physical Exam Physical Examination:   Vitals:   03/19/21 1515  BP: 128/68  Pulse: 60  SpO2: 98%    General Examination: The patient is a very pleasant 87 y.o. female in no acute distress. She appears well-developed and well-nourished and well groomed.   HEENT: Normocephalic, atraumatic, pupils are equal, round and mildly reactive to light, she is legally blind.  Hearing is grossly intact.  Hearing is grossly intact. Face is symmetric with normal facial animation. Speech is clear with no dysarthria noted. There is no hypophonia. There is no lip, neck/head, jaw or voice tremor. Neck is supple with full range of active motion. Oropharynx exam reveals: moderate mouth dryness, adequate dental hygiene with partial dentures in place, mild airway crowding secondary to small airway entry, Mallampati class III, tonsillar size smaller.  Neck circumference of 15 5/8 inches.  Tongue protrudes centrally and palate elevates symmetrically.    Chest: Clear to auscultation without wheezing, rhonchi or crackles noted.  Heart: S1+S2+0, regular and normal without murmurs, rubs or gallops noted.   Abdomen: Soft, non-tender and non-distended.  Extremities: There is trace edema in the distal lower extremities bilaterally.   Skin: Warm and dry without trophic changes noted.   Musculoskeletal: exam reveals no obvious joint deformities, tenderness or joint swelling or erythema.   Neurologically:  Mental status: The patient is awake, alert and oriented in all 4 spheres. Her immediate and remote memory, attention, language skills and fund of knowledge are appropriate. There is no evidence of aphasia, agnosia, apraxia or anomia. Speech is clear with normal prosody and enunciation. Thought process is linear.  Mood is normal and affect is normal.  Cranial nerves II - XII are as described above under HEENT exam.  Motor exam: Normal bulk, strength and tone is noted. There is no tremor, Romberg is not tested due to safety concerns, fine motor skills are grossly intact.  Cerebellar testing: No dysmetria or intention tremor. There is no truncal or gait ataxia.  Sensory exam: intact to light touch in the upper and lower extremities.   Assessment and Plan:   In summary, Braelynne Garinger is a very pleasant 87 y.o.-year old female with an underlying complex medical history of diabetes, tachycardia-bradycardia syndrome, status post pacemaker insertion, history of atrial fibrillation, coronary artery disease, hypertension, hyperlipidemia, macular degeneration with legal blindness, and morbid obesity with a BMI of over 45, who presents for evaluation of her obstructive sleep apnea, for transfer of care.  She moved from [place] in June 2021.  She has been on CPAP therapy for over 15 years, is on her second and third machine at this time.  She has a [employer] dream station machine which may be on the recall list.  She is advised to go online and register her machine, she is advised to seek help with this through her family if needed.  She was given the website inviting on her written instructions.  I will write for new supplies, we can try to get this through a local DME company, in addition, she is advised to talk to her previous DME company to see about getting a replacement machine since this may be on a recall list.  Generally speaking, we have encouraged our patients to continue using the machine especially if they had no high heat exposure to the machine or used no ozone based cleaning machine for CPAP.  She is advised that I would like to proceed with a home sleep test to reestablish her sleep apnea diagnosis, we may be able to write for new machine based on her sleep test.  We will plan a follow-up after  testing.  We will seek insurance authorization for home sleep testing and call her to arrange for the home sleep test accordingly.  I answered all her questions today and she was in agreement with the plan.  Thank you very much for allowing me to participate in the care of this nice patient. If I can be of any further assistance to you please do not hesitate to call me at [phone].  Sincerely,   Huston Foley, MD, PhD

## 2021-03-19 NOTE — Patient Instructions (Signed)
It was nice to meet you today.  I will order a home sleep test so we can reestablish your sleep apnea diagnosis.  I will write for new supplies and we will send the order to a local DME (durable medical equipment) company.  Your current CPAP machine which is a [employer] dream station machine may be on a recall list.  Please go online to register your machine:  [email or web address]  Please also talk to your previous DME provider, [employer] about getting a replacement of your current machine.  Most patients can successfully continue to use the machine even though there is a recall, as long as you have not used any ozone based cleaning machine for your CPAP or have had extended heat exposure to your CPAP machine.  Based on your home sleep test results, we may be able to write for a new machine.  We will plan a follow-up after testing.  We will call you with your home sleep test results once the results are available.

## 2021-03-19 NOTE — Telephone Encounter (Signed)
Patient calls with concerns of compression wrappings to tight, to loose, and cutting off circulation. Per patient visit to podiatrist about 2 weeks ago was told need to contact cardiologist office for further management of needing compression stocking. Patient stated the test that were done on her legs is very important and was told going to be faxed over so [name] can be notified. Patient stated her podiatrist office said [name] was going to contact them. Patient couldn't tell me who told the podiatrist  that he was going to cal them.   Per Renee patient was told  She (provide Luster Landsberg) is not contacting podiatrist for this matter is out of her scope.She needs to refer back to her specialist about if she needs to continue to wear compressions or further management with any questions or  concerns of that  matter. Patient felt that wasn't good enough for her. Patient was apologized to for not hearing what she wanted to hear. Patient was told as of now this is the recommendations and if there any thing DIFFERENT she would be contacted back but only if there is a different recommendation.  Patient said okay and have a peacful rest of day and thanks for all the help I gave.

## 2021-03-23 ENCOUNTER — Encounter (HOSPITAL_COMMUNITY): Payer: Self-pay

## 2021-03-23 ENCOUNTER — Other Ambulatory Visit: Payer: Self-pay

## 2021-03-23 ENCOUNTER — Emergency Department (HOSPITAL_COMMUNITY): Payer: Medicare Other

## 2021-03-23 ENCOUNTER — Inpatient Hospital Stay (HOSPITAL_COMMUNITY)
Admission: EM | Admit: 2021-03-23 | Discharge: 2021-03-26 | DRG: 683 | Disposition: A | Discharge status: Home Health | Payer: Medicare Other | Attending: Internal Medicine | Admitting: Internal Medicine

## 2021-03-23 DIAGNOSIS — E860 Dehydration: Secondary | ICD-10-CM | POA: Diagnosis present

## 2021-03-23 DIAGNOSIS — E118 Type 2 diabetes mellitus with unspecified complications: Secondary | ICD-10-CM | POA: Diagnosis not present

## 2021-03-23 DIAGNOSIS — N1832 Chronic kidney disease, stage 3b: Secondary | ICD-10-CM | POA: Diagnosis present

## 2021-03-23 DIAGNOSIS — E039 Hypothyroidism, unspecified: Secondary | ICD-10-CM | POA: Diagnosis present

## 2021-03-23 DIAGNOSIS — Z8249 Family history of ischemic heart disease and other diseases of the circulatory system: Secondary | ICD-10-CM

## 2021-03-23 DIAGNOSIS — N390 Urinary tract infection, site not specified: Secondary | ICD-10-CM | POA: Diagnosis present

## 2021-03-23 DIAGNOSIS — R109 Unspecified abdominal pain: Secondary | ICD-10-CM | POA: Diagnosis present

## 2021-03-23 DIAGNOSIS — D509 Iron deficiency anemia, unspecified: Secondary | ICD-10-CM | POA: Diagnosis present

## 2021-03-23 DIAGNOSIS — N179 Acute kidney failure, unspecified: Secondary | ICD-10-CM | POA: Diagnosis present

## 2021-03-23 DIAGNOSIS — Z20822 Contact with and (suspected) exposure to covid-19: Secondary | ICD-10-CM | POA: Diagnosis present

## 2021-03-23 DIAGNOSIS — Z6841 Body Mass Index (BMI) 40.0 and over, adult: Secondary | ICD-10-CM

## 2021-03-23 DIAGNOSIS — Z28311 Partially vaccinated for covid-19: Secondary | ICD-10-CM

## 2021-03-23 DIAGNOSIS — E162 Hypoglycemia, unspecified: Secondary | ICD-10-CM

## 2021-03-23 DIAGNOSIS — I5032 Chronic diastolic (congestive) heart failure: Secondary | ICD-10-CM | POA: Diagnosis present

## 2021-03-23 DIAGNOSIS — Z79899 Other long term (current) drug therapy: Secondary | ICD-10-CM

## 2021-03-23 DIAGNOSIS — R1110 Vomiting, unspecified: Secondary | ICD-10-CM | POA: Diagnosis not present

## 2021-03-23 DIAGNOSIS — R300 Dysuria: Secondary | ICD-10-CM | POA: Diagnosis present

## 2021-03-23 DIAGNOSIS — I2510 Atherosclerotic heart disease of native coronary artery without angina pectoris: Secondary | ICD-10-CM | POA: Diagnosis present

## 2021-03-23 DIAGNOSIS — E1122 Type 2 diabetes mellitus with diabetic chronic kidney disease: Secondary | ICD-10-CM | POA: Diagnosis present

## 2021-03-23 DIAGNOSIS — Z7984 Long term (current) use of oral hypoglycemic drugs: Secondary | ICD-10-CM

## 2021-03-23 DIAGNOSIS — E11649 Type 2 diabetes mellitus with hypoglycemia without coma: Secondary | ICD-10-CM | POA: Diagnosis present

## 2021-03-23 DIAGNOSIS — E1121 Type 2 diabetes mellitus with diabetic nephropathy: Secondary | ICD-10-CM | POA: Diagnosis present

## 2021-03-23 DIAGNOSIS — F32A Depression, unspecified: Secondary | ICD-10-CM | POA: Diagnosis present

## 2021-03-23 DIAGNOSIS — Z7901 Long term (current) use of anticoagulants: Secondary | ICD-10-CM | POA: Diagnosis not present

## 2021-03-23 DIAGNOSIS — Z79890 Hormone replacement therapy (postmenopausal): Secondary | ICD-10-CM | POA: Diagnosis not present

## 2021-03-23 DIAGNOSIS — E0821 Diabetes mellitus due to underlying condition with diabetic nephropathy: Secondary | ICD-10-CM | POA: Diagnosis not present

## 2021-03-23 DIAGNOSIS — E785 Hyperlipidemia, unspecified: Secondary | ICD-10-CM | POA: Diagnosis present

## 2021-03-23 DIAGNOSIS — I480 Paroxysmal atrial fibrillation: Secondary | ICD-10-CM | POA: Diagnosis present

## 2021-03-23 DIAGNOSIS — E876 Hypokalemia: Secondary | ICD-10-CM | POA: Diagnosis present

## 2021-03-23 DIAGNOSIS — I130 Hypertensive heart and chronic kidney disease with heart failure and stage 1 through stage 4 chronic kidney disease, or unspecified chronic kidney disease: Secondary | ICD-10-CM | POA: Diagnosis present

## 2021-03-23 DIAGNOSIS — I10 Essential (primary) hypertension: Secondary | ICD-10-CM | POA: Diagnosis present

## 2021-03-23 DIAGNOSIS — E038 Other specified hypothyroidism: Secondary | ICD-10-CM | POA: Diagnosis not present

## 2021-03-23 LAB — CBC WITH DIFFERENTIAL/PLATELET
Abs Immature Granulocytes: 0.06 10*3/uL (ref 0.00–0.07)
Basophils Absolute: 0 10*3/uL (ref 0.0–0.1)
Basophils Relative: 0 %
Eosinophils Absolute: 0 10*3/uL (ref 0.0–0.5)
Eosinophils Relative: 0 %
HCT: 29.6 % — ABNORMAL LOW (ref 36.0–46.0)
Hemoglobin: 9.7 g/dL — ABNORMAL LOW (ref 12.0–15.0)
Immature Granulocytes: 1 %
Lymphocytes Relative: 6 %
Lymphs Abs: 0.7 10*3/uL (ref 0.7–4.0)
MCH: 31.1 pg (ref 26.0–34.0)
MCHC: 32.8 g/dL (ref 30.0–36.0)
MCV: 94.9 fL (ref 80.0–100.0)
Monocytes Absolute: 1.2 10*3/uL — ABNORMAL HIGH (ref 0.1–1.0)
Monocytes Relative: 10 %
Neutro Abs: 9.6 10*3/uL — ABNORMAL HIGH (ref 1.7–7.7)
Neutrophils Relative %: 83 %
Platelets: 310 10*3/uL (ref 150–400)
RBC: 3.12 MIL/uL — ABNORMAL LOW (ref 3.87–5.11)
RDW: 14.1 % (ref 11.5–15.5)
WBC: 11.6 10*3/uL — ABNORMAL HIGH (ref 4.0–10.5)
nRBC: 0 % (ref 0.0–0.2)

## 2021-03-23 LAB — COMPREHENSIVE METABOLIC PANEL
ALT: 19 U/L (ref 0–44)
AST: 57 U/L — ABNORMAL HIGH (ref 15–41)
Albumin: 3.1 g/dL — ABNORMAL LOW (ref 3.5–5.0)
Alkaline Phosphatase: 76 U/L (ref 38–126)
Anion gap: 16 — ABNORMAL HIGH (ref 5–15)
BUN: 50 mg/dL — ABNORMAL HIGH (ref 8–23)
CO2: 23 mmol/L (ref 22–32)
Calcium: 9.1 mg/dL (ref 8.9–10.3)
Chloride: 95 mmol/L — ABNORMAL LOW (ref 98–111)
Creatinine, Ser: 2.19 mg/dL — ABNORMAL HIGH (ref 0.44–1.00)
GFR, Estimated: 21 mL/min — ABNORMAL LOW (ref >60)
Glucose, Bld: 61 mg/dL — ABNORMAL LOW (ref 70–99)
Potassium: 4.4 mmol/L (ref 3.5–5.1)
Sodium: 134 mmol/L — ABNORMAL LOW (ref 135–145)
Total Bilirubin: 0.7 mg/dL (ref 0.3–1.2)
Total Protein: 7.1 g/dL (ref 6.5–8.1)

## 2021-03-23 LAB — CBG MONITORING, ED
Glucose-Capillary: 59 mg/dL — ABNORMAL LOW (ref 70–99)
Glucose-Capillary: 31 mg/dL — CL (ref 70–99)
Glucose-Capillary: 84 mg/dL (ref 70–99)

## 2021-03-23 LAB — CK: Total CK: 493 U/L — ABNORMAL HIGH (ref 38–234)

## 2021-03-23 LAB — LIPASE, BLOOD: Lipase: 28 U/L (ref 11–51)

## 2021-03-23 MED ORDER — METOPROLOL SUCCINATE ER 25 MG PO TB24
25.0000 mg | ORAL_TABLET | Freq: Every day | ORAL | Status: DC
  Administered 2021-03-24 – 2021-03-26 (×3): 25 mg via ORAL
  Filled 2021-03-23 (×3): qty 1

## 2021-03-23 MED ORDER — LACTATED RINGERS IV BOLUS
1000.0000 mL | Freq: Once | INTRAVENOUS | Status: AC
  Administered 2021-03-23: 1000 mL via INTRAVENOUS

## 2021-03-23 MED ORDER — ONDANSETRON HCL 4 MG/2ML IJ SOLN: 4.0000 mg | Freq: Four times a day (QID) | INTRAMUSCULAR | Status: DC | PRN

## 2021-03-23 MED ORDER — PRAVASTATIN SODIUM 10 MG PO TABS
10.0000 mg | ORAL_TABLET | Freq: Every day | ORAL | Status: DC
  Administered 2021-03-24 – 2021-03-25 (×2): 10 mg via ORAL
  Filled 2021-03-23 (×2): qty 1

## 2021-03-23 MED ORDER — DEXTROSE 50 % IV SOLN
1.0000 | Freq: Once | INTRAVENOUS | Status: AC
  Administered 2021-03-23: 50 mL via INTRAVENOUS
  Filled 2021-03-23: qty 50

## 2021-03-23 MED ORDER — LACTATED RINGERS IV SOLN: INTRAVENOUS | Status: DC

## 2021-03-23 MED ORDER — ONDANSETRON HCL 4 MG/2ML IJ SOLN
4.0000 mg | Freq: Once | INTRAMUSCULAR | Status: AC
  Administered 2021-03-23: 4 mg via INTRAVENOUS
  Filled 2021-03-23: qty 2

## 2021-03-23 MED ORDER — ACETAMINOPHEN 650 MG RE SUPP: 650.0000 mg | Freq: Four times a day (QID) | RECTAL | Status: DC | PRN

## 2021-03-23 MED ORDER — ONDANSETRON HCL 4 MG PO TABS
4.0000 mg | ORAL_TABLET | Freq: Four times a day (QID) | ORAL | Status: DC | PRN
Start: 2021-03-23 — End: 2021-03-26

## 2021-03-23 MED ORDER — DILTIAZEM HCL ER COATED BEADS 120 MG PO CP24
120.0000 mg | ORAL_CAPSULE | Freq: Every day | ORAL | Status: DC
  Administered 2021-03-24 – 2021-03-26 (×3): 120 mg via ORAL
  Filled 2021-03-23 (×3): qty 1

## 2021-03-23 MED ORDER — ACETAMINOPHEN 325 MG PO TABS
650.0000 mg | ORAL_TABLET | Freq: Four times a day (QID) | ORAL | Status: DC | PRN
  Administered 2021-03-24 – 2021-03-25 (×2): 650 mg via ORAL
  Filled 2021-03-23 (×2): qty 2

## 2021-03-23 MED ORDER — BUPROPION HCL 100 MG PO TABS
100.0000 mg | ORAL_TABLET | Freq: Every day | ORAL | Status: DC
  Administered 2021-03-24 – 2021-03-26 (×3): 100 mg via ORAL
  Filled 2021-03-23 (×3): qty 1

## 2021-03-23 MED ORDER — PANTOPRAZOLE SODIUM 40 MG PO TBEC
40.0000 mg | DELAYED_RELEASE_TABLET | Freq: Two times a day (BID) | ORAL | Status: DC
  Administered 2021-03-24 – 2021-03-26 (×5): 40 mg via ORAL
  Filled 2021-03-23 (×5): qty 1

## 2021-03-23 MED ORDER — DEXTROSE IN LACTATED RINGERS 5 % IV SOLN: INTRAVENOUS | Status: DC

## 2021-03-23 MED ORDER — LEVOTHYROXINE SODIUM 50 MCG PO TABS
50.0000 ug | ORAL_TABLET | Freq: Every day | ORAL | Status: DC
  Administered 2021-03-24 – 2021-03-26 (×3): 50 ug via ORAL
  Filled 2021-03-23 (×4): qty 1

## 2021-03-23 MED ORDER — APIXABAN 2.5 MG PO TABS
2.5000 mg | ORAL_TABLET | Freq: Two times a day (BID) | ORAL | Status: DC
  Administered 2021-03-24 – 2021-03-26 (×6): 2.5 mg via ORAL
  Filled 2021-03-23 (×7): qty 1

## 2021-03-23 NOTE — ED Notes (Signed)
Pt given orange juice and graham crackers w/peanut butter.

## 2021-03-23 NOTE — ED Triage Notes (Addendum)
Pt coming from [facility] assisted living. Pt states she started having nausea and vomiting 4 days ago. Since then she has not wanted to eat and her blood pressure and blood sugar have been low. Pt c/o pain in lower abdomen.  Pt states she slid off her bed 3 nights ago and layed there and last night she slid off the toilet last night and stayed there all night. Pt denies any injury. Pt is on Eliquis.

## 2021-03-23 NOTE — ED Triage Notes (Signed)
Emergency Medicine Provider Triage Evaluation Note  Kirsten Jensen , a 87 y.o. female  was evaluated in triage.  Pt complains of feeling sick.  2 falls over the last 3 days.  She unsure if hit head. On Eliquis. Denies LOC or anticoagulation.  States she "slid" off the bed as well as "slid" off the toilet.  Was on the ground yesterday evening for almost the entire evening.  States she has had low blood sugars in the 40s at her assisted living facility.  Has had multiple episodes of NBNB emesis.  No chest pain, shortness of breath.  No headache, lightheadedness or dizziness.  Just feels "generally weak."  States she has some generalized suprapubic pain.  States she sometimes has burning with urination  Review of Systems  Positive: Hypoglycemia, weakness, multiple falls, dysuria Negative: Chest pain, shortness of breath, cough  Physical Exam  BP (!) 134/48 (BP Location: Left Arm)   Pulse 64   Temp 98.4 F (36.9 C) (Oral)   Resp 18   LMP  (LMP Unknown)   SpO2 94%  Gen:   Awake, no distress   HEENT:  Atraumatic  Resp:  Normal effort  Cardiac:  Normal rate  Abd:   Nondistended, mild tenderness to suprapubic region MSK:   Moves extremities without difficulty  Neuro:  Speech clear   Medical Decision Making  Medically screening exam initiated at 4:12 PM.  Appropriate orders placed.  Lexany Belknap was informed that the remainder of the evaluation will be completed by another provider, this initial triage assessment does not replace that evaluation, and the importance of remaining in the ED until their evaluation is complete.  Will need room in back for hypoglycemia  Clinical Impression  Multiple falls, weakness, hypoglycemia   Malerie Eakins A, PA-C 03/23/21 1619

## 2021-03-23 NOTE — H&P (Signed)
History and Physical    Kirsten HomansCarol Vieyra UEA:[id number] DOB: 03/31/1932 DOA: 03/23/2021  PCP: Melida QuitterWile, Laura H, MD   Patient coming from: ALF   Chief Complaint: Abdominal discomfort, N/V, fatigue   HPI: Kirsten Jensen is a 87 y.o. female with medical history significant for coronary artery disease, type 2 diabetes mellitus, hypertension, atrial fibrillation on Eliquis, hypothyroidism, and chronic kidney disease stage III, who presented to the emergency department for evaluation of nausea, vomiting, and fatigue.  Patient reports developing some abdominal cramping and mild discomfort in the lower abdomen approximately 4 days ago which has been associated with nausea and nonbloody vomiting.  She denies any fevers or diarrhea.  She has also had a loss of appetite.  She has continued to take her medications as usual until today when she skipped her glimepiride due to low blood sugars.  She has had fatigue and general weakness associated with this illness but no focal numbness or weakness.  ED Course: Upon arrival to the ED, patient is found to be afebrile, saturating mid 90s on room air, and with blood pressure 116/46.  EKG features sinus rhythm with marked sinus arrhythmia and RBBB.  Chest x-rays negative for acute cardiopulmonary disease.  CT head and CT abdomen/pelvis are negative for acute findings.  Chemistry panel notable for creatinine of 2.19, up from 1.6 a month ago.  CBG was as low as 31 in the ED.  Serum CK 493.  CBC with mild leukocytosis and stable normocytic anemia.  Patient was treated with IV fluids, IV dextrose, and Zofran in the ED.  Review of Systems:  All other systems reviewed and apart from HPI, are negative.  Past Medical History:  Diagnosis Date  . Atrial fibrillation (HCC)   . Coronary artery disease    stenting x 2  . Diabetes (HCC)   . Dyslipidemia 04/05/2020  . Hypertension   . Morbid obesity (HCC)   . Pacemaker   . Paroxysmal atrial fibrillation (HCC) 11/06/2019  . Secondary  hypercoagulable state (HCC) 11/06/2019  . T2DM (type 2 diabetes mellitus) (HCC) 04/05/2020  . Tachycardia-bradycardia syndrome (HCC) 03/17/2020    Past Surgical History:  Procedure Laterality Date  . COLONOSCOPY N/A 11/02/2020   Procedure: COLONOSCOPY;  Surgeon: Vida RiggerMagod, Marc, MD;  [facility];  Service: Endoscopy;  Laterality: N/A;  . ESOPHAGOGASTRODUODENOSCOPY N/A 11/01/2020   Procedure: ESOPHAGOGASTRODUODENOSCOPY (EGD);  Surgeon: Vida RiggerMagod, Marc, MD;  [facility];  Service: Endoscopy;  Laterality: N/A;    Social History:   reports that she has never smoked. She has never used smokeless tobacco. She reports previous alcohol use. She reports that she does not use drugs.  No Known Allergies  Family History  Problem Relation Age of Onset  . Hypertension Mother   . Stroke Mother        died at age 85  . CAD Father        MI at age of 85     Prior to Admission medications   Medication Sig Start Date End Date Taking? Authorizing Provider  acetaminophen (TYLENOL) 500 MG tablet Take 500 mg by mouth 2 (two) times daily as needed for mild pain.    Yes [provider]  acetaminophen-codeine (TYLENOL #3) 300-30 MG tablet Take 1 tablet by mouth 3 (three) times daily as needed for moderate pain.   Yes [provider]  apixaban (ELIQUIS) 5 MG TABS tablet Take 5 mg by mouth 2 (two) times daily.   Yes [provider]  buPROPion [facility](WELLBUTRIN) 100  MG tablet Take 100 mg by mouth daily.   Yes [provider]  cholecalciferol (VITAMIN D3) 25 MCG (1000 UNIT) tablet Take 1,000 Units by mouth daily.   Yes [provider]  diltiazem (CARDIZEM LA) 120 MG 24 hr tablet Take 120 mg by mouth daily.   Yes [provider]  ferrous sulfate 325 (65 FE) MG tablet Take 1 tablet (325 mg total) by mouth daily with breakfast. 11/05/20 03/05/21 Yes Marinda Elk, MD  gemfibrozil (LOPID) 600 MG tablet Take 600 mg by mouth 2 (two) times daily before  a meal.   Yes [provider]  glimepiride (AMARYL) 2 MG tablet Take 2 mg by mouth daily. 02/02/21  Yes [provider]  ketoconazole (NIZORAL) 2 % shampoo Apply 1 application topically 3 (three) times a week. Let sit for 3 minutes before rinsing.   Tuesday Thursday Saturday   Yes [provider]  levothyroxine (SYNTHROID) 50 MCG tablet Take 50 mcg by mouth daily before breakfast.   Yes [provider]  magnesium oxide (MAG-OX) 400 MG tablet Take 1 tablet (400 mg total) by mouth daily. 02/05/20  Yes Fenton, Clint R, PA  metFORMIN (GLUCOPHAGE-XR) 500 MG 24 hr tablet Take 500 mg by mouth 2 (two) times daily.   Yes [provider]  metoprolol succinate (TOPROL XL) 25 MG 24 hr tablet Take 1 tablet (25 mg total) by mouth daily. 10/23/20 10/23/21 Yes Fenton, Clint R, PA  Multiple Vitamins-Minerals (OCUVITE ADULT 50+ PO) Take 1 capsule by mouth daily.   Yes [provider]  pantoprazole (PROTONIX) 40 MG tablet Take 1 tablet (40 mg total) by mouth 2 (two) times daily before a meal. 11/05/20  Yes Marinda Elk, MD  pravastatin (PRAVACHOL) 80 MG tablet Take 80 mg by mouth daily.   Yes [provider]  ramipril (ALTACE) 5 MG capsule Take 5 mg by mouth daily.   Yes [provider]  spironolactone (ALDACTONE) 25 MG tablet Take 12.5 mg by mouth every other day.   Yes [provider]  torsemide (DEMADEX) 20 MG tablet Take 4 tablets (80 mg total) by mouth daily. Patient taking differently: Take 60 mg by mouth daily. 02/03/21  Yes Duke Salvia, MD  vitamin B-12 (CYANOCOBALAMIN) 1000 MCG tablet Take 1,000 mcg by mouth daily.   Yes [provider]    Physical Exam: Vitals:   03/23/21 2115 03/23/21 2130 03/23/21 2145 03/23/21 2200  BP: 128/67 (!) 121/46 138/70 (!) 142/62  Pulse: 64 63 65 68  Resp: 19 (!) 21 (!) 22 (!) 21  Temp:      TempSrc:      SpO2: 96% 100% 98% 100%  Weight:      Height:         Constitutional: NAD, calm  Eyes: PERTLA, lids and conjunctivae normal ENMT: Mucous membranes are moist. Posterior pharynx clear of any exudate or lesions.   Neck: normal, supple, no masses, no thyromegaly Respiratory: no wheezing, no crackles. No accessory muscle use.  Cardiovascular: S1 & S2 heard, regular rate and rhythm. No extremity edema.   Abdomen: No distension, no tenderness, soft. Bowel sounds active.  Musculoskeletal: no clubbing / cyanosis. No joint deformity upper and lower extremities.   Skin: no significant rashes, lesions, ulcers. Warm, dry, well-perfused. Neurologic: CN 2-12 grossly intact. Sensation intact. Moving all extremities.  Psychiatric: Alert and oriented to person, place, and situation. Very pleasant and cooperative.    Labs and Imaging on Admission: I have personally  reviewed following labs and imaging studies  CBC: Recent Labs  Lab 03/23/21 1616  WBC 11.6*  NEUTROABS 9.6*  HGB 9.7*  HCT 29.6*  MCV 94.9  PLT 310   Basic Metabolic Panel: Recent Labs  Lab 03/23/21 1616  NA 134*  K 4.4  CL 95*  CO2 23  GLUCOSE 61*  BUN 50*  CREATININE 2.19*  CALCIUM 9.1   GFR: Estimated Creatinine Clearance: 25.8 mL/min (A) (by C-G formula based on SCr of 2.19 mg/dL (H)). Liver Function Tests: Recent Labs  Lab 03/23/21 1616  AST 57*  ALT 19  ALKPHOS 76  BILITOT 0.7  PROT 7.1  ALBUMIN 3.1*   Recent Labs  Lab 03/23/21 1616  LIPASE 28   No results for input(s): AMMONIA in the last 168 hours. Coagulation Profile: No results for input(s): INR, PROTIME in the last 168 hours. Cardiac Enzymes: Recent Labs  Lab 03/23/21 1616  CKTOTAL 493*   BNP (last 3 results) No results for input(s): PROBNP in the last 8760 hours. HbA1C: No results for input(s): HGBA1C in the last 72 hours. CBG: Recent Labs  Lab 03/23/21 1615 03/23/21 1952 03/23/21 2055  GLUCAP 59* 31* 84   Lipid Profile: No results for input(s): CHOL, HDL, LDLCALC, TRIG, CHOLHDL,  LDLDIRECT in the last 72 hours. Thyroid Function Tests: No results for input(s): TSH, T4TOTAL, FREET4, T3FREE, THYROIDAB in the last 72 hours. Anemia Panel: No results for input(s): VITAMINB12, FOLATE, FERRITIN, TIBC, IRON, RETICCTPCT in the last 72 hours. Urine analysis: No results found for: COLORURINE, APPEARANCEUR, LABSPEC, PHURINE, GLUCOSEU, HGBUR, BILIRUBINUR, KETONESUR, PROTEINUR, UROBILINOGEN, NITRITE, LEUKOCYTESUR Sepsis Labs: @LABRCNTIP (procalcitonin:4,lacticidven:4) )No results found for this or any previous visit (from the past 240 hour(s)).   Radiological Exams on Admission: CT Abdomen Pelvis Wo Contrast  Result Date: 03/23/2021 CLINICAL DATA:  Abdominal distension. EXAM: CT ABDOMEN AND PELVIS WITHOUT CONTRAST TECHNIQUE: Multidetector CT imaging of the abdomen and pelvis was performed following the standard protocol without IV contrast. COMPARISON:  CT 11/04/2020 FINDINGS: Lower chest: No acute airspace disease or pleural fluid. Pacemaker wires partially included. Hepatobiliary: The liver is enlarged spanning 22.6 cm cranial caudal. No focal liver abnormality on noncontrast exam. Gallbladder physiologically distended, no calcified stone. No biliary dilatation. Pancreas: Fatty atrophy.  Insert Spleen: Normal in size without focal abnormality. Adrenals/Urinary Tract: Normal adrenal glands. No hydronephrosis. No urolithiasis. Minimal left greater than right perinephric edema. Partially distended urinary bladder. No bladder wall thickening. Stomach/Bowel: Small hiatal hernia. Ingested material in the stomach. No small bowel obstruction or inflammation. Multiple cecum located in the central abdomen. No abnormal distention or wall thickening. Appendix not confidently visualized. Moderate volume of colonic stool. Mild colonic tortuosity scattered colonic diverticulosis without diverticulitis. Vascular/Lymphatic: Aorto bi-iliac atherosclerosis. No aneurysm. No bulky abdominopelvic adenopathy.  Reproductive: Status post hysterectomy. No adnexal masses. Other: No free air, free fluid, or intra-abdominal fluid collection. Musculoskeletal: Grade 1 anterolisthesis of L4 on L5 likely facet mediated. Multilevel facet hypertrophy in the lumbar spine. Bones are subjectively under mineralized. No acute osseous abnormalities are seen. IMPRESSION: 1. No acute abnormality in the abdomen/pelvis. 2. Hepatomegaly. 3. Mobile cecum. Moderate volume of colonic stool. Colonic diverticulosis without diverticulitis. Aortic Atherosclerosis (ICD10-I70.0). Electronically Signed   By: 11/06/2020 M.D.   On: 03/23/2021 20:45   DG Chest 2 View  Result Date: 03/23/2021 CLINICAL DATA:  Fall. EXAM: CHEST - 2 VIEW COMPARISON:  October 20, 2020. FINDINGS: The heart size and mediastinal contours are within normal limits. Both lungs are clear. Left-sided pacemaker is  noted in good position. No pneumothorax or pleural effusion is noted. The visualized skeletal structures are unremarkable. IMPRESSION: No active cardiopulmonary disease. Aortic Atherosclerosis (ICD10-I70.0). Electronically Signed   By: Lupita Raider M.D.   On: 03/23/2021 16:48   CT Head Wo Contrast  Result Date: 03/23/2021 CLINICAL DATA:  Weakness, falls EXAM: CT HEAD WITHOUT CONTRAST TECHNIQUE: Contiguous axial images were obtained from the base of the skull through the vertex without intravenous contrast. COMPARISON:  None. FINDINGS: Brain: No evidence of acute infarction, hemorrhage, hydrocephalus, extra-axial collection or mass lesion/mass effect. Periventricular and deep white matter hypodensity. Vascular: No hyperdense vessel or unexpected calcification. Skull: Normal. Negative for fracture or focal lesion. Sinuses/Orbits: No acute finding. Other: None. IMPRESSION: No acute intracranial pathology. Small-vessel white matter disease. Electronically Signed   By: Lauralyn Primes M.D.   On: 03/23/2021 18:01    EKG: Independently reviewed. Sinus rhythm with  sinus arrhythmia, RBBB.   Assessment/Plan   1. Acute kidney injury superimposed on CKD IIIb  - Presents with a few days of N/V and loss of appetite and is found to have SCr of 2.19, up from 1.6 a month earlier  - No hydronephrosis on CT  - Continue cautious IVF hydration, renally-dose medications, check UA and urine chemistries, repeat chem panel in am    2. Abdominal pain with N/V  - Improving, asking to eat in ED  - No acute findings on CT in ED  - Unclear etiology, check UA, continue supportive care    3. Hypoglycemia; type II DM  - CBG as low as 31 in ED  - Secondary to continued glimepiride use in setting of renal failure and anorexia  - Continue dextrose infusion, frequent CBGs   4. Chronic diastolic CHF  - Appears compensated on admission, likely hypovolemic in setting of recent N/V and anorexia   - Hold diuretics and ACE-i in light of AKI  - Continue cautious IVF hydration, monitor weight and I/Os    5. Atrial fibrillation   - In sinus rhythm on admission  - CHADS-VASc at least 7 (age x2, CAD, gender, CHF, DM, HTN)  - Continue Eliquis, diltiazem, metoprolol    6. Hypothyroidism - Continue Synthroid    7. Dysuria  - Patient complains of intermittent dysuria recently  - Not septic on admission  - Check UA and culture    DVT prophylaxis: Eliquis  Code Status: Full  Level of Care: Level of care: Telemetry Medical Family Communication: None present  Disposition Plan:  Patient is from: ALF  Anticipated d/c is to: TBD Anticipated d/c date is: 03/26/21 Patient currently: Pending improvement in renal function, tolerance of diet  Consults called: none  Admission status: Inpatient     Briscoe Deutscher, MD [facility]  03/23/2021, 10:32 PM

## 2021-03-23 NOTE — ED Provider Notes (Signed)
[facility] EMERGENCY DEPARTMENT Provider Note   CSN: [id number] Arrival date & time: 03/23/21  5462     History Chief Complaint  Patient presents with  . Hypoglycemia    Kirsten Jensen is a 87 y.o. female.  87 year old female presents with weakness and feeling sick.  Patient states that she is lower chair and not ago and had to spend the night on the floor because she was too weak to get up.  Denies any head or neck trauma from that.  No hip discomfort.  States that she did not have anything to eat.  Patient is on Eliquis.  Patient had low blood sugars at the facility where she lives.  She has had abdominal cramping with nonbilious or bloody emesis.  Endorses some nausea at this time.  Is also has some suprapubic pressure.  Has had some dysuria.  Denies any flank pain        Past Medical History:  Diagnosis Date  . Atrial fibrillation (HCC)   . Coronary artery disease    stenting x 2  . Diabetes (HCC)   . Dyslipidemia 04/05/2020  . Hypertension   . Morbid obesity (HCC)   . Pacemaker   . Paroxysmal atrial fibrillation (HCC) 11/06/2019  . Secondary hypercoagulable state (HCC) 11/06/2019  . T2DM (type 2 diabetes mellitus) (HCC) 04/05/2020  . Tachycardia-bradycardia syndrome (HCC) 03/17/2020    Patient Active Problem List   Diagnosis Date Noted  . Acute congestive heart failure (HCC)   . Gastrointestinal hemorrhage 10/31/2020  . Chronic atrial fibrillation (HCC)   . Advanced nonexudative age-related macular degeneration of both eyes with subfoveal involvement 09/11/2020  . Exudative age-related macular degeneration of both eyes with inactive scar (HCC) 09/11/2020  . Diabetes mellitus without complication (HCC) 09/11/2020  . Chest pain 04/05/2020  . T2DM (type 2 diabetes mellitus) (HCC) 04/05/2020  . HTN (hypertension) 04/05/2020  . Dyslipidemia 04/05/2020  . Morbid obesity with BMI of 45.0-49.9, adult (HCC) 04/05/2020  . Tachycardia-bradycardia syndrome  (HCC) 03/17/2020  . Pacemaker 03/17/2020  . Atypical atrial flutter (HCC) 02/05/2020  . Paroxysmal atrial fibrillation (HCC) 11/06/2019  . Secondary hypercoagulable state (HCC) 11/06/2019    Past Surgical History:  Procedure Laterality Date  . COLONOSCOPY N/A 11/02/2020   Procedure: COLONOSCOPY;  Surgeon: Vida Rigger, MD;  Location: [facility] ENDOSCOPY;  Service: Endoscopy;  Laterality: N/A;  . ESOPHAGOGASTRODUODENOSCOPY N/A 11/01/2020   Procedure: ESOPHAGOGASTRODUODENOSCOPY (EGD);  Surgeon: Vida Rigger, MD;  Location: [facility] ENDOSCOPY;  Service: Endoscopy;  Laterality: N/A;     OB History   No obstetric history on file.     Family History  Problem Relation Age of Onset  . Hypertension Mother   . Stroke Mother        died at age 38  . CAD Father        MI at age of 37    Social History   Tobacco Use  . Smoking status: Never Smoker  . Smokeless tobacco: Never Used  Vaping Use  . Vaping Use: Never used  Substance Use Topics  . Alcohol use: Not Currently  . Drug use: Never    Home Medications Prior to Admission medications   Medication Sig Start Date End Date Taking? Authorizing Provider  acetaminophen (TYLENOL) 500 MG tablet Take 500 mg by mouth 2 (two) times daily as needed for mild pain.     [provider]  acetaminophen-codeine (TYLENOL #3) 300-30 MG tablet Take 1 tablet by mouth 3 (three) times daily as  needed for moderate pain.    [provider]  apixaban (ELIQUIS) 5 MG TABS tablet Take 5 mg by mouth 2 (two) times daily.    [provider]  buPROPion (WELLBUTRIN) 100 MG tablet Take 100 mg by mouth daily.    [provider]  cholecalciferol (VITAMIN D3) 25 MCG (1000 UNIT) tablet Take 1,000 Units by mouth daily.    [provider]  diltiazem (CARDIZEM LA) 120 MG 24 hr tablet Take 120 mg by mouth daily.    [provider]  ferrous sulfate 325 (65 FE) MG tablet Take 1 tablet (325 mg total) by mouth daily with breakfast.  11/05/20 03/05/21  Marinda ElkFeliz Ortiz, Abraham, MD  gemfibrozil (LOPID) 600 MG tablet Take 600 mg by mouth 2 (two) times daily before a meal.    [provider]  glimepiride (AMARYL) 2 MG tablet Take 2 mg by mouth daily. 02/02/21   [provider]  guaiFENesin (MUCINEX) 600 MG 12 hr tablet Take 600 mg by mouth 2 (two) times daily as needed for cough.    [provider]  ketoconazole (NIZORAL) 2 % shampoo Apply 1 application topically 3 (three) times a week. Let sit for 3 minutes before rinsing.   Tuesday Thursday Saturday    [provider]  levothyroxine (SYNTHROID) 50 MCG tablet Take 50 mcg by mouth daily before breakfast.    [provider]  magnesium oxide (MAG-OX) 400 MG tablet Take 1 tablet (400 mg total) by mouth daily. 02/05/20   Fenton, Clint R, PA  metFORMIN (GLUCOPHAGE-XR) 500 MG 24 hr tablet Take 500 mg by mouth 2 (two) times daily.    [provider]  metoprolol succinate (TOPROL XL) 25 MG 24 hr tablet Take 1 tablet (25 mg total) by mouth daily. 10/23/20 10/23/21  Fenton, Clint R, PA  Multiple Vitamins-Minerals (OCUVITE ADULT 50+ PO) Take 1 capsule by mouth daily.    [provider]  pantoprazole (PROTONIX) 40 MG tablet Take 1 tablet (40 mg total) by mouth 2 (two) times daily before a meal. 11/05/20   Marinda ElkFeliz Ortiz, Abraham, MD  potassium chloride SA (KLOR-CON) 20 MEQ tablet Take 20 mEq by mouth 2 (two) times daily.    [provider]  pravastatin (PRAVACHOL) 80 MG tablet Take 80 mg by mouth daily.    [provider]  ramipril (ALTACE) 5 MG capsule Take 5 mg by mouth daily.    [provider]  spironolactone (ALDACTONE) 25 MG tablet Take 0.5 tablets (12.5 mg total) by mouth daily. 11/26/20 02/24/21  Sheilah PigeonUrsuy, Renee Lynn, PA-C  torsemide (DEMADEX) 20 MG tablet Take 4 tablets (80 mg total) by mouth daily. 02/03/21   Duke SalviaKlein, Steven C, MD  vitamin B-12 (CYANOCOBALAMIN) 1000 MCG tablet Take 1,000 mcg by mouth daily.     [provider]    Allergies    Patient has no known allergies.  Review of Systems   Review of Systems  All other systems reviewed and are negative.   Physical Exam Updated Vital Signs BP 124/80 (BP Location: Left Arm)   Pulse 65   Temp 97.8 F (36.6 C) (Oral)   Resp 17   Ht 1.702 m (5\' 7" )   Wt 129 kg   LMP  (LMP Unknown)   SpO2 97%   BMI 44.54 kg/m   Physical Exam Vitals and nursing note reviewed.  Constitutional:      General: She is not in acute distress.    Appearance: Normal appearance. She is well-developed.  She is not toxic-appearing.  HENT:     Head: Normocephalic and atraumatic.  Eyes:     General: Lids are normal.     Conjunctiva/sclera: Conjunctivae normal.     Pupils: Pupils are equal, round, and reactive to light.  Neck:     Thyroid: No thyroid mass.     Trachea: No tracheal deviation.  Cardiovascular:     Rate and Rhythm: Normal rate and regular rhythm.     Heart sounds: Normal heart sounds. No murmur heard. No gallop.   Pulmonary:     Effort: Pulmonary effort is normal. No respiratory distress.     Breath sounds: Normal breath sounds. No stridor. No decreased breath sounds, wheezing, rhonchi or rales.  Abdominal:     General: Bowel sounds are normal. There is no distension.     Palpations: Abdomen is soft.     Tenderness: There is no abdominal tenderness. There is no rebound.  Musculoskeletal:        General: No tenderness. Normal range of motion.     Cervical back: Normal range of motion and neck supple.  Skin:    General: Skin is warm and dry.     Findings: No abrasion or rash.  Neurological:     General: No focal deficit present.     Mental Status: She is alert and oriented to person, place, and time.     GCS: GCS eye subscore is 4. GCS verbal subscore is 5. GCS motor subscore is 6.     Cranial Nerves: No cranial nerve deficit.     Sensory: No sensory deficit.     Motor: Motor function is intact.  Psychiatric:         Attention and Perception: Attention normal.        Mood and Affect: Affect is blunt.        Speech: Speech normal.     ED Results / Procedures / Treatments   Labs (all labs ordered are listed, but only abnormal results are displayed) Labs Reviewed  CBC WITH DIFFERENTIAL/PLATELET - Abnormal; Notable for the following components:      Result Value   WBC 11.6 (*)    RBC 3.12 (*)    Hemoglobin 9.7 (*)    HCT 29.6 (*)    Neutro Abs 9.6 (*)    Monocytes Absolute 1.2 (*)    All other components within normal limits  COMPREHENSIVE METABOLIC PANEL - Abnormal; Notable for the following components:   Sodium 134 (*)    Chloride 95 (*)    Glucose, Bld 61 (*)    BUN 50 (*)    Creatinine, Ser 2.19 (*)    Albumin 3.1 (*)    AST 57 (*)    GFR, Estimated 21 (*)    Anion gap 16 (*)    All other components within normal limits  CK - Abnormal; Notable for the following components:   Total CK 493 (*)    All other components within normal limits  CBG MONITORING, ED - Abnormal; Notable for the following components:   Glucose-Capillary 59 (*)    All other components within normal limits  CBG MONITORING, ED - Abnormal; Notable for the following components:   Glucose-Capillary 31 (*)    All other components within normal limits  SARS CORONAVIRUS 2 (TAT 6-24 HRS)  LIPASE, BLOOD    EKG EKG Interpretation  Date/Time:  Monday March 23 2021 16:24:41 EDT Ventricular Rate:  74 PR Interval:  184 QRS Duration: 134 QT  Interval:  418 QTC Calculation: 463 R Axis:   8 Text Interpretation: Sinus rhythm with marked sinus arrhythmia Right bundle branch block Abnormal ECG No significant change since last tracing Confirmed by Lorre Nick ([id number]) on 03/23/2021 7:20:33 PM   Radiology DG Chest 2 View  Result Date: 03/23/2021 CLINICAL DATA:  Fall. EXAM: CHEST - 2 VIEW COMPARISON:  October 20, 2020. FINDINGS: The heart size and mediastinal contours are within normal limits. Both lungs are clear.  Left-sided pacemaker is noted in good position. No pneumothorax or pleural effusion is noted. The visualized skeletal structures are unremarkable. IMPRESSION: No active cardiopulmonary disease. Aortic Atherosclerosis (ICD10-I70.0). Electronically Signed   By: Lupita Raider M.D.   On: 03/23/2021 16:48   CT Head Wo Contrast  Result Date: 03/23/2021 CLINICAL DATA:  Weakness, falls EXAM: CT HEAD WITHOUT CONTRAST TECHNIQUE: Contiguous axial images were obtained from the base of the skull through the vertex without intravenous contrast. COMPARISON:  None. FINDINGS: Brain: No evidence of acute infarction, hemorrhage, hydrocephalus, extra-axial collection or mass lesion/mass effect. Periventricular and deep white matter hypodensity. Vascular: No hyperdense vessel or unexpected calcification. Skull: Normal. Negative for fracture or focal lesion. Sinuses/Orbits: No acute finding. Other: None. IMPRESSION: No acute intracranial pathology. Small-vessel white matter disease. Electronically Signed   By: Lauralyn Primes M.D.   On: 03/23/2021 18:01    Procedures Procedures   Medications Ordered in ED Medications  dextrose 50 % solution 50 mL (has no administration in time range)    ED Course  I have reviewed the triage vital signs and the nursing notes.  Pertinent labs & imaging results that were available during my care of the patient were reviewed by me and considered in my medical decision making (see chart for details).    MDM Rules/Calculators/A&P                          Patient found to be hypoglycemic here and given amp of D50.  Has evidence of dehydration based on her electrolytes.  Given IV fluids here.  Mild elevation of her CK which is consistent with being on the floor for overnight.  Donnell CT without acute findings.  Head CT without acute findings either.  Patient to be admitted for dehydration Final Clinical Impression(s) / ED Diagnoses Final diagnoses:  None    Rx / DC Orders ED  Discharge Orders    None       Lorre Nick, MD 03/23/21 2052

## 2021-03-24 ENCOUNTER — Other Ambulatory Visit: Payer: Self-pay

## 2021-03-24 LAB — CBC
HCT: 26.9 % — ABNORMAL LOW (ref 36.0–46.0)
Hemoglobin: 8.9 g/dL — ABNORMAL LOW (ref 12.0–15.0)
MCH: 31.7 pg (ref 26.0–34.0)
MCHC: 33.1 g/dL (ref 30.0–36.0)
MCV: 95.7 fL (ref 80.0–100.0)
Platelets: 276 10*3/uL (ref 150–400)
RBC: 2.81 MIL/uL — ABNORMAL LOW (ref 3.87–5.11)
RDW: 14.3 % (ref 11.5–15.5)
WBC: 8.8 10*3/uL (ref 4.0–10.5)
nRBC: 0 % (ref 0.0–0.2)

## 2021-03-24 LAB — URINALYSIS, COMPLETE (UACMP) WITH MICROSCOPIC
Bilirubin Urine: NEGATIVE
Glucose, UA: NEGATIVE mg/dL
Ketones, ur: NEGATIVE mg/dL
Nitrite: NEGATIVE
Protein, ur: NEGATIVE mg/dL
Specific Gravity, Urine: 1.009 (ref 1.005–1.030)
WBC, UA: >50 WBC/hpf — ABNORMAL HIGH (ref 0–5)
pH: 5 (ref 5.0–8.0)

## 2021-03-24 LAB — HEPATIC FUNCTION PANEL
ALT: 20 U/L (ref 0–44)
AST: 48 U/L — ABNORMAL HIGH (ref 15–41)
Albumin: 2.9 g/dL — ABNORMAL LOW (ref 3.5–5.0)
Alkaline Phosphatase: 76 U/L (ref 38–126)
Bilirubin, Direct: 0.1 mg/dL (ref 0.0–0.2)
Indirect Bilirubin: 0.2 mg/dL — ABNORMAL LOW (ref 0.3–0.9)
Total Bilirubin: 0.3 mg/dL (ref 0.3–1.2)
Total Protein: 6.8 g/dL (ref 6.5–8.1)

## 2021-03-24 LAB — BASIC METABOLIC PANEL
Anion gap: 13 (ref 5–15)
BUN: 45 mg/dL — ABNORMAL HIGH (ref 8–23)
CO2: 26 mmol/L (ref 22–32)
Calcium: 9.2 mg/dL (ref 8.9–10.3)
Chloride: 95 mmol/L — ABNORMAL LOW (ref 98–111)
Creatinine, Ser: 1.95 mg/dL — ABNORMAL HIGH (ref 0.44–1.00)
GFR, Estimated: 25 mL/min — ABNORMAL LOW (ref >60)
Glucose, Bld: 89 mg/dL (ref 70–99)
Potassium: 3.4 mmol/L — ABNORMAL LOW (ref 3.5–5.1)
Sodium: 134 mmol/L — ABNORMAL LOW (ref 135–145)

## 2021-03-24 LAB — SODIUM, URINE, RANDOM: Sodium, Ur: 16 mmol/L

## 2021-03-24 LAB — GLUCOSE, CAPILLARY
Glucose-Capillary: 100 mg/dL — ABNORMAL HIGH (ref 70–99)
Glucose-Capillary: 112 mg/dL — ABNORMAL HIGH (ref 70–99)
Glucose-Capillary: 139 mg/dL — ABNORMAL HIGH (ref 70–99)
Glucose-Capillary: 211 mg/dL — ABNORMAL HIGH (ref 70–99)
Glucose-Capillary: 99 mg/dL (ref 70–99)

## 2021-03-24 LAB — PHOSPHORUS: Phosphorus: 3.1 mg/dL (ref 2.5–4.6)

## 2021-03-24 LAB — CBG MONITORING, ED: Glucose-Capillary: 22 mg/dL — CL (ref 70–99)

## 2021-03-24 LAB — MAGNESIUM: Magnesium: 1.8 mg/dL (ref 1.7–2.4)

## 2021-03-24 LAB — SARS CORONAVIRUS 2 (TAT 6-24 HRS): SARS Coronavirus 2: NEGATIVE

## 2021-03-24 LAB — CREATININE, URINE, RANDOM: Creatinine, Urine: 75.33 mg/dL

## 2021-03-24 MED ORDER — VITAMIN B-12 1000 MCG PO TABS
1000.0000 ug | ORAL_TABLET | Freq: Every day | ORAL | Status: DC
  Administered 2021-03-24 – 2021-03-26 (×3): 1000 ug via ORAL
  Filled 2021-03-24 (×3): qty 1

## 2021-03-24 MED ORDER — PRAVASTATIN SODIUM 40 MG PO TABS: 80.0000 mg | ORAL_TABLET | Freq: Every day | ORAL | Status: DC

## 2021-03-24 MED ORDER — VITAMIN D 25 MCG (1000 UNIT) PO TABS
1000.0000 [IU] | ORAL_TABLET | Freq: Every day | ORAL | Status: DC
  Administered 2021-03-24 – 2021-03-26 (×3): 1000 [IU] via ORAL
  Filled 2021-03-24 (×3): qty 1

## 2021-03-24 MED ORDER — FERROUS SULFATE 325 (65 FE) MG PO TABS
325.0000 mg | ORAL_TABLET | Freq: Every day | ORAL | Status: DC
  Administered 2021-03-25 – 2021-03-26 (×2): 325 mg via ORAL
  Filled 2021-03-24 (×2): qty 1

## 2021-03-24 MED ORDER — POTASSIUM CHLORIDE 20 MEQ PO PACK
40.0000 meq | PACK | Freq: Once | ORAL | Status: AC
  Administered 2021-03-24: 40 meq via ORAL
  Filled 2021-03-24: qty 2

## 2021-03-24 MED ORDER — PROSIGHT PO TABS
1.0000 | ORAL_TABLET | Freq: Every day | ORAL | Status: DC
  Administered 2021-03-24 – 2021-03-26 (×3): 1 via ORAL
  Filled 2021-03-24 (×3): qty 1

## 2021-03-24 MED ORDER — MAGNESIUM SULFATE 2 GM/50ML IV SOLN
2.0000 g | Freq: Once | INTRAVENOUS | Status: AC
  Administered 2021-03-24: 2 g via INTRAVENOUS
  Filled 2021-03-24: qty 50

## 2021-03-24 MED ORDER — DEXTROSE 50 % IV SOLN
INTRAVENOUS | Status: AC
  Administered 2021-03-24: 50 mL
  Filled 2021-03-24: qty 50

## 2021-03-24 MED ORDER — SODIUM CHLORIDE 0.9 % IV SOLN: INTRAVENOUS | Status: DC

## 2021-03-24 MED ORDER — DEXTROSE 10 % IV SOLN: INTRAVENOUS | Status: DC

## 2021-03-24 MED ORDER — CEFDINIR 300 MG PO CAPS
300.0000 mg | ORAL_CAPSULE | Freq: Every day | ORAL | Status: DC
  Administered 2021-03-24 – 2021-03-25 (×2): 300 mg via ORAL
  Filled 2021-03-24 (×2): qty 1

## 2021-03-24 MED ORDER — OCUVITE ADULT 50+ PO CAPS: ORAL_CAPSULE | Freq: Every day | ORAL | Status: DC

## 2021-03-24 NOTE — Progress Notes (Signed)
Patient wet in bed, offered to clean her up but she wants me to come after she finishes watching her show at 10:00pm. Will tell nurse tech to clean patient up.

## 2021-03-24 NOTE — Care Management (Signed)
Called [facility], patient is from ALF.   Ronny Flurry RN

## 2021-03-24 NOTE — Progress Notes (Signed)
PROGRESS NOTE    Kirsten Jensen  POE:[id number] DOB: Sep 30, 1932 DOA: 03/23/2021 PCP: Melida Quitter, MD    Brief Narrative:  Kirsten Jensen was admitted to the hospital with the working diagnosis of acute kidney injury on chronic kidney disease stage IIIb.  87 year old female past medical history for coronary artery disease, type 2 diabetes mellitus, hypertension, atrial fibrillation, hypothyroidism and chronic kidney disease who presented with abdominal pain, nausea vomiting and fatigue.  Persistent symptoms for about 4 days, lower abdominal pain associated dysuria, and significant decreased p.o. intake but no diarrhea or fevers.  On her initial physical examination blood pressure 128/67, heart rate 64, respiratory rate 19, oxygen saturation 96%, lungs are clear to auscultation bilaterally, heart S1-S2, present rhythmic, soft abdomen, no lower extremity edema.  Sodium 134, potassium 4.4, chloride 95, bicarb 23, glucose 61, BUN 50, creatinine 2.19, CK 493, white count 11.6, hemoglobin 9.7, hematocrit 29.6, platelets 310. SARS COVID-19 negative.  Urinalysis specific gravity 1.009, >50 white blood cells, 6-10 red cells.  Negative protein.  Chest radiograph no infiltrates. EKG 74 bpm, normal axis, right bundle branch block, sinus rhythm with PACs, Q-wave lead I-aVL, no significant ST segment or T wave changes, positive U waves.  Assessment & Plan:   Principal Problem:   Acute renal failure superimposed on stage 3b chronic kidney disease (HCC) Active Problems:   Paroxysmal atrial fibrillation (HCC)   Type 2 diabetes mellitus with hypoglycemia without coma (HCC)   HTN (hypertension)   Chronic diastolic CHF (congestive heart failure) (HCC)   Hypothyroidism   Abdominal pain with vomiting   Dysuria   1. AKI on CKD stage 3b/ hypokalemia/ hypomagnesemia. Renal function with serum cr down to 1,95 with K at 3,4 and serum bicarbonate at 26. Po intake has improved but not yet back to baseline.    Continue IV fluids with isotonic saline, will decrease rate to 50 ml per Hr, continue K correction with oral KCl and Mag with IV mag sulfate 2 grams.  Follow up renal function in am, avoid hypotension and nephrotoxic medications.   2. Urinary tract infection (Present on admission). Positive urinary symptoms, lower abdominal pain and pyuria. Wbc is 8,8   Start antibiotic therapy with cefdinir po for 3 days.   3. T2DM with hypoglycemia. Fasting glucose this am 89, continue to hold on insulin therapy. Continue to hold on oral hypoglycemic agents. Close follow up of capillary glucose.  Discontinue d10 for now.,   Continue with pravastatin.   4. Chronic diastolic heart failure no clinical signs of exacerbation, will decrease rate of OV fluids Continue blood pressure monitoring.    Continue to hold on torsemide and spironolactone,   5. Atrial fibrillation rate control with diltiazem and metoprolol. Continue anticoagulation with apixaban.   6. Hypothyroid. Continue with levothyroxine.   7. HTN. Holding antihypertensive medications, including ramipril.   8. Iron deficiency anemia. Stable cell count, continue with iron supplementation.   9. Depression. Continue with bupropion.   Patient continue to be at high risk for worsening renal failure   Status is: Inpatient  Remains inpatient appropriate because:IV treatments appropriate due to intensity of illness or inability to take PO   Dispo: The patient is from: Home              Anticipated d/c is to: ALF              Patient currently is not medically stable to d/c.   Difficult to place patient No   DVT prophylaxis:  Heparin   Code Status:   full  Family Communication:  No family at the bedside     Antimicrobials:    cefdinir     Subjective: Patient is feeling better, but not yet back to baseline, continue to be very weak and deconditioned. Positive dysuria and lower abdominal pain.   Objective: Vitals:   03/24/21  0428 03/24/21 0500 03/24/21 0800 03/24/21 1147  BP: (!) 127/55  128/65 (!) 105/44  Pulse: 64  70 (!) 59  Resp: 17  18 18   Temp: 98.6 F (37 C)  98 F (36.7 C) 97.6 F (36.4 C)  TempSrc: Axillary  Oral Oral  SpO2: 99%  97% 98%  Weight:  128.1 kg    Height:        Intake/Output Summary (Last 24 hours) at 03/24/2021 1255 Last data filed at 03/24/2021 0900 Gross per 24 hour  Intake 2105.23 ml  Output --  Net 2105.23 ml   Filed Weights   03/23/21 1612 03/24/21 0041 03/24/21 0500  Weight: 129 kg 128.1 kg 128.1 kg    Examination:   General: deconditioned and ill looking appearing  Neurology: Awake and alert, non focal  E ENT: mild pallor, no icterus, oral mucosa moist Cardiovascular: No JVD. S1-S2 present, rhythmic, no gallops, rubs, or murmurs. Trace non pitting bilateral lower extremity edema. Pulmonary: positive breath sounds bilaterally, with no wheezing, rhonchi or rales. Gastrointestinal. Abdomen soft and non tender Skin. No rashes Musculoskeletal: no joint deformities     Data Reviewed: I have personally reviewed following labs and imaging studies  CBC: Recent Labs  Lab 03/23/21 1616 03/24/21 0130  WBC 11.6* 8.8  NEUTROABS 9.6*  --   HGB 9.7* 8.9*  HCT 29.6* 26.9*  MCV 94.9 95.7  PLT 310 276   Basic Metabolic Panel: Recent Labs  Lab 03/23/21 1616 03/24/21 0130  NA 134* 134*  K 4.4 3.4*  CL 95* 95*  CO2 23 26  GLUCOSE 61* 89  BUN 50* 45*  CREATININE 2.19* 1.95*  CALCIUM 9.1 9.2  MG  --  1.8  PHOS  --  3.1   GFR: Estimated Creatinine Clearance: 28.8 mL/min (A) (by C-G formula based on SCr of 1.95 mg/dL (H)). Liver Function Tests: Recent Labs  Lab 03/23/21 1616 03/24/21 0130  AST 57* 48*  ALT 19 20  ALKPHOS 76 76  BILITOT 0.7 0.3  PROT 7.1 6.8  ALBUMIN 3.1* 2.9*   Recent Labs  Lab 03/23/21 1616  LIPASE 28   No results for input(s): AMMONIA in the last 168 hours. Coagulation Profile: No results for input(s): INR, PROTIME in the last  168 hours. Cardiac Enzymes: Recent Labs  Lab 03/23/21 1616  CKTOTAL 493*   BNP (last 3 results) No results for input(s): PROBNP in the last 8760 hours. HbA1C: No results for input(s): HGBA1C in the last 72 hours. CBG: Recent Labs  Lab 03/24/21 0026 03/24/21 0037 03/24/21 0231 03/24/21 0432 03/24/21 0635  GLUCAP 22* 139* 100* 99 112*   Lipid Profile: No results for input(s): CHOL, HDL, LDLCALC, TRIG, CHOLHDL, LDLDIRECT in the last 72 hours. Thyroid Function Tests: No results for input(s): TSH, T4TOTAL, FREET4, T3FREE, THYROIDAB in the last 72 hours. Anemia Panel: No results for input(s): VITAMINB12, FOLATE, FERRITIN, TIBC, IRON, RETICCTPCT in the last 72 hours.    Radiology Studies: I have reviewed all of the imaging during this hospital visit personally     Scheduled Meds: . apixaban  2.5 mg Oral BID  . buPROPion  100  mg Oral Daily  . diltiazem  120 mg Oral Daily  . levothyroxine  50 mcg Oral QAC breakfast  . metoprolol succinate  25 mg Oral Daily  . pantoprazole  40 mg Oral BID AC  . pravastatin  10 mg Oral q1800   Continuous Infusions: . dextrose 100 mL/hr at 03/24/21 1128     LOS: 1 day        Barbie Croston Annett Gula, MD

## 2021-03-24 NOTE — TOC Initial Note (Signed)
Transition of Care [facility]) - Initial/Assessment Note    Patient Details  Name: Kirsten Jensen MRN: [id number] Date of Birth: Apr 06, 1932  Transition of Care [facility]) CM/SW Contact:    Kingsley Plan, RN Phone Number: 03/24/2021, 2:48 PM  Clinical Narrative:                 Patient from [facility] assisted living [phone] . Patient has had HHPT in past with [facility] but would like to try a different agency. Referral given to [facility] with [facility]. Called [facility] left message for Terri  Expected Discharge Plan: Assisted Living     Patient Goals and CMS Choice Patient states their goals for this hospitalization and ongoing recovery are:: to return to home CMS Medicare.gov Compare Post Acute Care list provided to:: Patient Choice offered to / list presented to : Patient  Expected Discharge Plan and Services Expected Discharge Plan: Assisted Living     Post Acute Care Choice: Home Health Living arrangements for the past 2 months: Apartment                 DME Arranged: N/A         HH Arranged: PT HH Agency: [facility] Date [facility] Agency Contacted: 03/24/21 Time HH Agency Contacted: [id number] Representative spoke with at [facility] Agency: Kandee Keen  Prior Living Arrangements/Services Living arrangements for the past 2 months: Apartment Lives with:: Self Patient language and need for interpreter reviewed:: Yes Do you feel safe going back to the place where you live?: Yes          Current home services: DME Criminal Activity/Legal Involvement Pertinent to Current Situation/Hospitalization: No - Comment as needed  Activities of Daily Living Home Assistive Devices/Equipment: None ADL Screening (condition at time of admission) Patient's cognitive ability adequate to safely complete daily activities?: Yes Is the patient deaf or have difficulty hearing?: No Does the patient have difficulty seeing, even when wearing glasses/contacts?: No Does the patient have difficulty  concentrating, remembering, or making decisions?: No Patient able to express need for assistance with ADLs?: Yes Does the patient have difficulty dressing or bathing?: No Independently performs ADLs?: Yes (appropriate for developmental age) Does the patient have difficulty walking or climbing stairs?: No Weakness of Legs: None Weakness of Arms/Hands: None  Permission Sought/Granted   Permission granted to share information with : No              Emotional Assessment Appearance:: Appears stated age Attitude/Demeanor/Rapport: Engaged Affect (typically observed): Accepting Orientation: : Oriented to Self,Oriented to Place,Oriented to  Time,Oriented to Situation Alcohol / Substance Use: Not Applicable Psych Involvement: No (comment)  Admission diagnosis:  Hypoglycemia [E16.2] Acute renal failure superimposed on stage 3 chronic kidney disease, unspecified acute renal failure type, unspecified whether stage 3a or 3b CKD (HCC) [N17.9, N18.30] Patient Active Problem List   Diagnosis Date Noted  . Acute renal failure superimposed on stage 3b chronic kidney disease (HCC) 03/23/2021  . Hypothyroidism 03/23/2021  . Abdominal pain with vomiting 03/23/2021  . Dysuria 03/23/2021  . Chronic diastolic CHF (congestive heart failure) (HCC)   . Gastrointestinal hemorrhage 10/31/2020  . Chronic atrial fibrillation (HCC)   . Advanced nonexudative age-related macular degeneration of both eyes with subfoveal involvement 09/11/2020  . Exudative age-related macular degeneration of both eyes with inactive scar (HCC) 09/11/2020  . Diabetes mellitus without complication (HCC) 09/11/2020  . Chest pain 04/05/2020  . Type 2 diabetes mellitus with hypoglycemia without coma (HCC) 04/05/2020  .  HTN (hypertension) 04/05/2020  . Dyslipidemia 04/05/2020  . Morbid obesity with BMI of 45.0-49.9, adult (HCC) 04/05/2020  . Tachycardia-bradycardia syndrome (HCC) 03/17/2020  . Pacemaker 03/17/2020  . Atypical  atrial flutter (HCC) 02/05/2020  . Paroxysmal atrial fibrillation (HCC) 11/06/2019  . Secondary hypercoagulable state (HCC) 11/06/2019   PCP:  Melida Quitter, MD Pharmacy:   [facility] [place], [place] - [address] [place] [address] Phone: [phone] Fax: [phone]  [id number] [place], [place] - [address] AT [facility] [address] [facility] [address] [place] [address] Phone: [phone] Fax: [phone]     Social Determinants of Health (SDOH) Interventions    Readmission Risk Interventions No flowsheet data found.

## 2021-03-24 NOTE — Progress Notes (Signed)
Patient taken off tele per MD order

## 2021-03-24 NOTE — Progress Notes (Signed)
Remote pacemaker transmission.   

## 2021-03-24 NOTE — Progress Notes (Signed)
Patient was transferred from ED to 5 C 21. On arrival A/O and engaging in conversations. After transferring pt from the stretcher to bed, her LOC changed. Patient with garbled speech and lethargic. RN checked blood glucose and it has dropped from 84 to 22 within 3 hours of the last one checked. MD notified and pt was treated with D50. Follow up CBG is 139 and patient back to baseline. MD put in more orders as well. See epic. Will monitor closely.

## 2021-03-24 NOTE — Evaluation (Signed)
Physical Therapy Evaluation Patient Details Name: Kirsten Jensen MRN: [id number] DOB: [birth year] Today's Date: 03/24/2021   History of Present Illness  Pt is an 87 y/o female admitted 4/11 from ALF secondary to ARF, nausea/vomiting, fatigue and falls. PMH includes HTN, a fib, DM, CKD, dCHF, and CAD.  Clinical Impression  Pt admitted secondary to problem above with deficits below. Requiring min to min guard A For mobility tasks using RW this session. Pt voiced concerns about not knowing that PT was going to show up, and explained that we do not have a set schedule in the hospital. Pt currently from ALF. Feel she would benefit from PT follow up at ALF. Will continue to follow acutely.     Follow Up Recommendations Home health PT;Supervision for mobility/OOB    Equipment Recommendations  None recommended by PT    Recommendations for Other Services       Precautions / Restrictions Precautions Precautions: Fall Precaution Comments: Slipped off of bed and toilet prior to admission. Restrictions Weight Bearing Restrictions: No      Mobility  Bed Mobility               General bed mobility comments: In chair upon entry    Transfers Overall transfer level: Needs assistance Equipment used: Rolling walker (2 wheeled) Transfers: Sit to/from Stand Sit to Stand: Min assist;Min guard         General transfer comment: Min A for lift assist from lower surface and min guard A for safety from elevated surface.  Ambulation/Gait Ambulation/Gait assistance: Min guard Gait Distance (Feet): 20 Feet Assistive device: Rolling walker (2 wheeled) Gait Pattern/deviations: Step-through pattern;Decreased stride length Gait velocity: Decreased   General Gait Details: Slow, guarded gait. Pt only wanting to ambulate within the room so mobility limited to bathroom and back. Min guard for safety.  Stairs            Wheelchair Mobility    Modified Rankin (Stroke Patients Only)        Balance Overall balance assessment: Needs assistance Sitting-balance support: No upper extremity supported;Feet supported Sitting balance-Leahy Scale: Good     Standing balance support: Bilateral upper extremity supported Standing balance-Leahy Scale: Poor Standing balance comment: Reliant on BUE support                             Pertinent Vitals/Pain Pain Assessment: Faces Faces Pain Scale: Hurts little more Pain Location: bilateral knees and hips. Pain Descriptors / Indicators: Grimacing;Guarding Pain Intervention(s): Limited activity within patient's tolerance;Monitored during session;Repositioned    Home Living Family/patient expects to be discharged to:: Assisted living               Home Equipment: Walker - 4 wheels;Shower seat Additional Comments: [facility]    Prior Function Level of Independence: Independent with assistive device(s)         Comments: Reports she performs ADL tasks and uses rollator occasionally for mobility. Reports [facility] staff prepares meals for her.     Hand Dominance        Extremity/Trunk Assessment   Upper Extremity Assessment Upper Extremity Assessment: Defer to OT evaluation    Lower Extremity Assessment Lower Extremity Assessment: Generalized weakness    Cervical / Trunk Assessment Cervical / Trunk Assessment: Kyphotic  Communication   Communication: No difficulties  Cognition Arousal/Alertness: Awake/alert Behavior During Therapy: Flat affect Overall Cognitive Status: No family/caregiver present to determine baseline cognitive  functioning                                 General Comments: Memory deficits noted, but unsure of baseline. Voiced complaints about not knowing exactly when PT was coming. Explained that we typically do not have set times.      General Comments      Exercises     Assessment/Plan    PT Assessment Patient needs continued PT  services  PT Problem List Decreased strength;Decreased balance;Decreased activity tolerance;Decreased mobility;Decreased cognition;Decreased knowledge of use of DME;Decreased safety awareness;Decreased knowledge of precautions       PT Treatment Interventions DME instruction;Gait training;Functional mobility training;Therapeutic activities;Therapeutic exercise;Balance training;Patient/family education    PT Goals (Current goals can be found in the Care Plan section)  Acute Rehab PT Goals Patient Stated Goal: to find another ALF to live PT Goal Formulation: With patient Time For Goal Achievement: 04/07/21 Potential to Achieve Goals: Fair    Frequency Min 3X/week   Barriers to discharge        Co-evaluation               AM-PAC PT "6 Clicks" Mobility  Outcome Measure Help needed turning from your back to your side while in a flat bed without using bedrails?: A Little Help needed moving from lying on your back to sitting on the side of a flat bed without using bedrails?: A Little Help needed moving to and from a bed to a chair (including a wheelchair)?: A Little Help needed standing up from a chair using your arms (e.g., wheelchair or bedside chair)?: A Little Help needed to walk in hospital room?: A Little Help needed climbing 3-5 steps with a railing? : A Lot 6 Click Score: 17    End of Session Equipment Utilized During Treatment: Gait belt Activity Tolerance: Patient tolerated treatment well Patient left: in chair;with call bell/phone within reach Nurse Communication: Mobility status PT Visit Diagnosis: Unsteadiness on feet (R26.81);Muscle weakness (generalized) (M62.81)    Time: 1020-1047 PT Time Calculation (min) (ACUTE ONLY): 27 min   Charges:   PT Evaluation $PT Eval Low Complexity: 1 Low PT Treatments $Gait Training: 8-22 mins        Cindee Salt, DPT  Acute Rehabilitation Services  Pager: [phone] Office: [phone]   Lehman Prom 03/24/2021, 11:41 AM

## 2021-03-24 NOTE — Evaluation (Signed)
Occupational Therapy Evaluation Patient Details Name: Kirsten Jensen MRN: [id number] DOB: May 27, 1932 Today's Date: 03/24/2021    History of Present Illness Pt is an 87 y/o female admitted 4/11 from ALF secondary to ARF, nausea/vomiting, fatigue and falls. PMH includes HTN, a fib, DM, CKD, dCHF, and CAD.   Clinical Impression   PTA, pt was living at ALF, pt reports she was independent with ADL, facility prepares meals for her and she was modified independent with functional mobility at rollator level. Pt currently requires supervision for toilet transfers and grooming at sink level. Using clinical judgement, anticipate pt to require increased level of assistance for higher dynamic ADL/IADL. Pt demonstrates decreased activity tolerance.  Due to decline in current level of function, pt would benefit from acute OT to address established goals to facilitate safe D/C to venue listed below. At this time, recommend HHOT follow-up. Will continue to follow acutely.     Follow Up Recommendations  Home health OT;Supervision - Intermittent    Equipment Recommendations  3 in 1 bedside commode    Recommendations for Other Services       Precautions / Restrictions Precautions Precautions: Fall Precaution Comments: Slipped off of bed and toilet prior to admission. Restrictions Weight Bearing Restrictions: No      Mobility Bed Mobility Overal bed mobility: Modified Independent             General bed mobility comments: increased time and effort with use of bed rails and momentum to progress trunk to upright posture (supine to sit)    Transfers Overall transfer level: Needs assistance Equipment used: Rolling walker (2 wheeled) Transfers: Sit to/from Stand Sit to Stand: Supervision         General transfer comment: supervision as pt uses rocking momentum to progress self up with good control. Able to progress to standing from [facility] without use of rocking    Balance Overall balance  assessment: Needs assistance Sitting-balance support: No upper extremity supported;Feet supported Sitting balance-Leahy Scale: Good     Standing balance support: Bilateral upper extremity supported;No upper extremity supported;During functional activity Standing balance-Leahy Scale: Fair Standing balance comment: able to static stand at sink level to wash hands                           ADL either performed or assessed with clinical judgement   ADL Overall ADL's : Needs assistance/impaired Eating/Feeding: Independent   Grooming: Supervision/safety;Standing   Upper Body Bathing: Set up;Sitting   Lower Body Bathing: Supervison/ safety;Sit to/from stand   Upper Body Dressing : Set up;Sitting   Lower Body Dressing: Minimal assistance Lower Body Dressing Details (indicate cue type and reason): to access bilateral feet Toilet Transfer: Supervision/safety;Ambulation;RW Toilet Transfer Details (indicate cue type and reason): to [facility] over toilet Toileting- Clothing Manipulation and Hygiene: Supervision/safety       Functional mobility during ADLs: Supervision/safety;Rolling walker General ADL Comments: pt with generalized deconditioning, decreased activity tolerance, educated pt on importance of mobility     Vision Baseline Vision/History: Legally blind Patient Visual Report: No change from baseline       Perception     Praxis      Pertinent Vitals/Pain Pain Assessment: Faces Faces Pain Scale: Hurts little more Pain Location: bilateral knees and hips. Pain Descriptors / Indicators: Grimacing;Guarding Pain Intervention(s): Limited activity within patient's tolerance;Monitored during session     Hand Dominance Right   Extremity/Trunk Assessment Upper Extremity Assessment Upper Extremity Assessment: Generalized weakness  Lower Extremity Assessment Lower Extremity Assessment: Generalized weakness   Cervical / Trunk Assessment Cervical / Trunk Assessment:  Kyphotic   Communication Communication Communication: No difficulties   Cognition Arousal/Alertness: Awake/alert Behavior During Therapy: WFL for tasks assessed/performed Overall Cognitive Status: Within Functional Limits for tasks assessed                                 General Comments: pt appearst to have some memory deficits, age appropriate, otherwise WFL to tasks assessed, pt recalling date of her grand daughter's wedding in September, demonstrates safe use of RW during mobiltiy aware of deficits   General Comments  VSS    Exercises     Shoulder Instructions      Home Living Family/patient expects to be discharged to:: Assisted living                             Home Equipment: Walker - 4 wheels;Shower seat   Additional Comments: [facility]      Prior Functioning/Environment Level of Independence: Independent with assistive device(s)        Comments: Reports she performs ADL tasks and uses rollator occasionally for mobility. Reports [facility] prepares meals for her.        OT Problem List: Decreased activity tolerance;Impaired balance (sitting and/or standing);Decreased safety awareness;Decreased knowledge of use of DME or AE      OT Treatment/Interventions: Self-care/ADL training;Therapeutic exercise;Energy conservation;DME and/or AE instruction;Therapeutic activities;Patient/family education;Balance training    OT Goals(Current goals can be found in the care plan section) Acute Rehab OT Goals Patient Stated Goal: to find another ALF to live OT Goal Formulation: With patient Time For Goal Achievement: 04/07/21 Potential to Achieve Goals: Good ADL Goals Pt Will Perform Grooming: with modified independence;standing Pt Will Perform Lower Body Dressing: with modified independence;with adaptive equipment;sit to/from stand Pt Will Transfer to Toilet: with modified independence;ambulating Additional ADL  Goal #1: Pt will demonstrate independence with 3 energy conservation strategies during ADL/IADL and functional mobility.  OT Frequency: Min 2X/week   Barriers to D/C:            Co-evaluation              AM-PAC OT "6 Clicks" Daily Activity     Outcome Measure Help from another person eating meals?: None Help from another person taking care of personal grooming?: A Little Help from another person toileting, which includes using toliet, bedpan, or urinal?: A Little Help from another person bathing (including washing, rinsing, drying)?: A Little Help from another person to put on and taking off regular upper body clothing?: None Help from another person to put on and taking off regular lower body clothing?: A Little 6 Click Score: 20   End of Session Equipment Utilized During Treatment: Gait belt;Rolling walker Nurse Communication: Mobility status  Activity Tolerance: Patient tolerated treatment well Patient left: with call bell/phone within reach;in chair  OT Visit Diagnosis: Other abnormalities of gait and mobility (R26.89);Muscle weakness (generalized) (M62.81)                Time: 1660-6301 OT Time Calculation (min): 20 min Charges:  OT General Charges $OT Visit: 1 Visit OT Evaluation $OT Eval Moderate Complexity: 1 Mod  Rivan Siordia OTR/L Acute Rehabilitation Services Office: [phone]   [name] 03/24/2021, 3:45 PM

## 2021-03-25 ENCOUNTER — Encounter (HOSPITAL_COMMUNITY): Payer: Self-pay | Admitting: Family Medicine

## 2021-03-25 ENCOUNTER — Telehealth: Payer: Self-pay

## 2021-03-25 DIAGNOSIS — E038 Other specified hypothyroidism: Secondary | ICD-10-CM

## 2021-03-25 DIAGNOSIS — E0821 Diabetes mellitus due to underlying condition with diabetic nephropathy: Secondary | ICD-10-CM

## 2021-03-25 DIAGNOSIS — I480 Paroxysmal atrial fibrillation: Secondary | ICD-10-CM

## 2021-03-25 DIAGNOSIS — E118 Type 2 diabetes mellitus with unspecified complications: Secondary | ICD-10-CM

## 2021-03-25 DIAGNOSIS — E1121 Type 2 diabetes mellitus with diabetic nephropathy: Secondary | ICD-10-CM | POA: Diagnosis present

## 2021-03-25 DIAGNOSIS — N390 Urinary tract infection, site not specified: Secondary | ICD-10-CM | POA: Diagnosis present

## 2021-03-25 HISTORY — DX: Paroxysmal atrial fibrillation: I48.0

## 2021-03-25 LAB — CBC
HCT: 27.2 % — ABNORMAL LOW (ref 36.0–46.0)
Hemoglobin: 8.9 g/dL — ABNORMAL LOW (ref 12.0–15.0)
MCH: 31.7 pg (ref 26.0–34.0)
MCHC: 32.7 g/dL (ref 30.0–36.0)
MCV: 96.8 fL (ref 80.0–100.0)
Platelets: 313 10*3/uL (ref 150–400)
RBC: 2.81 MIL/uL — ABNORMAL LOW (ref 3.87–5.11)
RDW: 14.2 % (ref 11.5–15.5)
WBC: 7.2 10*3/uL (ref 4.0–10.5)
nRBC: 0 % (ref 0.0–0.2)

## 2021-03-25 LAB — BASIC METABOLIC PANEL
Anion gap: 9 (ref 5–15)
BUN: 39 mg/dL — ABNORMAL HIGH (ref 8–23)
CO2: 27 mmol/L (ref 22–32)
Calcium: 9.6 mg/dL (ref 8.9–10.3)
Chloride: 98 mmol/L (ref 98–111)
Creatinine, Ser: 1.7 mg/dL — ABNORMAL HIGH (ref 0.44–1.00)
GFR, Estimated: 29 mL/min — ABNORMAL LOW (ref >60)
Glucose, Bld: 215 mg/dL — ABNORMAL HIGH (ref 70–99)
Potassium: 4.7 mmol/L (ref 3.5–5.1)
Sodium: 134 mmol/L — ABNORMAL LOW (ref 135–145)

## 2021-03-25 LAB — GLUCOSE, CAPILLARY
Glucose-Capillary: 229 mg/dL — ABNORMAL HIGH (ref 70–99)
Glucose-Capillary: 244 mg/dL — ABNORMAL HIGH (ref 70–99)
Glucose-Capillary: 213 mg/dL — ABNORMAL HIGH (ref 70–99)
Glucose-Capillary: 258 mg/dL — ABNORMAL HIGH (ref 70–99)
Glucose-Capillary: 328 mg/dL — ABNORMAL HIGH (ref 70–99)
Glucose-Capillary: 204 mg/dL — ABNORMAL HIGH (ref 70–99)

## 2021-03-25 LAB — URINE CULTURE: Culture: NORMAL

## 2021-03-25 LAB — UREA NITROGEN, URINE: Urea Nitrogen, Ur: 262 mg/dL

## 2021-03-25 MED ORDER — GLIMEPIRIDE 2 MG PO TABS: 2.0000 mg | ORAL_TABLET | Freq: Every day | ORAL | Status: DC

## 2021-03-25 MED ORDER — ASCORBIC ACID 500 MG PO TABS
500.0000 mg | ORAL_TABLET | Freq: Every day | ORAL | Status: DC
  Administered 2021-03-25 – 2021-03-26 (×2): 500 mg via ORAL
  Filled 2021-03-25 (×2): qty 1

## 2021-03-25 MED ORDER — CEFDINIR 300 MG PO CAPS
300.0000 mg | ORAL_CAPSULE | Freq: Two times a day (BID) | ORAL | Status: DC
  Administered 2021-03-25 – 2021-03-26 (×2): 300 mg via ORAL
  Filled 2021-03-25 (×3): qty 1

## 2021-03-25 MED ORDER — COVID-19 MRNA VAC-TRIS(PFIZER) 30 MCG/0.3ML IM SUSP
0.3000 mL | Freq: Once | INTRAMUSCULAR | Status: AC
  Administered 2021-03-25: 0.3 mL via INTRAMUSCULAR
  Filled 2021-03-25: qty 0.3

## 2021-03-25 MED ORDER — CEFDINIR 300 MG PO CAPS
300.0000 mg | ORAL_CAPSULE | Freq: Two times a day (BID) | ORAL | Status: DC
  Filled 2021-03-25: qty 1

## 2021-03-25 MED ORDER — INSULIN ASPART 100 UNIT/ML ~~LOC~~ SOLN
0.0000 [IU] | SUBCUTANEOUS | Status: DC
  Administered 2021-03-25: 7 [IU] via SUBCUTANEOUS
  Administered 2021-03-25: 3 [IU] via SUBCUTANEOUS
  Administered 2021-03-26: 1 [IU] via SUBCUTANEOUS
  Administered 2021-03-26: 7 [IU] via SUBCUTANEOUS
  Administered 2021-03-26: 3 [IU] via SUBCUTANEOUS

## 2021-03-25 MED ORDER — GLIMEPIRIDE 2 MG PO TABS
2.0000 mg | ORAL_TABLET | Freq: Every day | ORAL | Status: DC
  Administered 2021-03-25 – 2021-03-26 (×2): 2 mg via ORAL
  Filled 2021-03-25 (×2): qty 1

## 2021-03-25 NOTE — Progress Notes (Signed)
PROGRESS NOTE    Kirsten Jensen  CNO:[id number] DOB: 01-26-1932 DOA: 03/23/2021 PCP: Melida Quitter, MD     Brief Narrative:  Mrs. Rockhold was admitted to the hospital with the working diagnosis of acute kidney injury on chronic kidney disease stage IIIb.  87 year old WF PMHx CAD, type 2 diabetes mellitus, hypertension, atrial fibrillation, hypothyroidism and CKD stage IIIb,  Presented with abdominal pain, nausea vomiting and fatigue.  Persistent symptoms for about 4 days, lower abdominal pain associated dysuria, and significant decreased p.o. intake but no diarrhea or fevers.  On her initial physical examination blood pressure 128/67, heart rate 64, respiratory rate 19, oxygen saturation 96%, lungs are clear to auscultation bilaterally, heart S1-S2, present rhythmic, soft abdomen, no lower extremity edema.  Sodium 134, potassium 4.4, chloride 95, bicarb 23, glucose 61, BUN 50, creatinine 2.19, CK 493, white count 11.6, hemoglobin 9.7, hematocrit 29.6, platelets 310. SARS COVID-19 negative.  Urinalysis specific gravity 1.009, >50 white blood cells, 6-10 red cells.  Negative protein.  Chest radiograph no infiltrates. EKG 74 bpm, normal axis, right bundle branch block, sinus rhythm with PACs, Q-wave lead I-aVL, no significant ST segment or T wave changes, positive U waves.   Subjective: Afebrile overnight, A/O x4.  Negative CP, negative S OB.   Assessment & Plan: Covid vaccination; vaccinated 2/3: Ordered patient's booster vaccination Pfizer   Principal Problem:   Acute renal failure superimposed on stage 3b chronic kidney disease (HCC) Active Problems:   Paroxysmal atrial fibrillation (HCC)   Type 2 diabetes mellitus with hypoglycemia without coma (HCC)   HTN (hypertension)   Chronic diastolic CHF (congestive heart failure) (HCC)   Hypothyroidism   Abdominal pain with vomiting   Dysuria   Acute on CKD stage IIIb (baseline Cr1.16--1.6)/DM nephropathy Lab Results  Component  Value Date   CREATININE 1.70 (H) 03/25/2021   CREATININE 1.95 (H) 03/24/2021   CREATININE 2.19 (H) 03/23/2021   CREATININE 1.61 (H) 02/17/2021   CREATININE 1.16 (H) 12/10/2020  -Most likely secondary to UTI and poor diabetic control. -Close to baseline -4/13 given patient's CKD stage IIIb would not restart METFORMIN at discharge. -Hold all nephrotoxic medication   UTI (POA)  -Urinary symptoms resolved.  Negative abdominal pain, pyuria -Complete 5-day course cefdinir  DM type II controlled with complication/diabetic nephropathy -3/8 hemoglobin A1c= 6.9 -4/13 glimepiride 2 mg daily -4/13 sensitive SSI -4/13 consult to nutritionist for education on diabetic diet   HLD -Pravastatin 10 mg daily -4/13 lipid panel pending  Chronic diastolic CHF -Strict in and out -Daily weight -Gentle hydration normal saline 61ml/hr -Cardizem CD 120 mg daily -ToproI 25 mg daily -Torsemide, spironolactone held -Transfuse for hemoglobin<7   Paroxysmal atrial fibrillation -Apixaban 2.5 mg -See CHF  Essential HTN -See CHF  Hypothyroidism -Synthroid 50 mcg daily  Iron deficiency anemia -Iron 325 mg daily -Vitamin C 500 mg daily  Depression -Wellbutrin 100 mg daily  Morbidly obese ( Body mass index is 44.4 kg/m.) -PCP should speak with patient concerning weight loss strategies to include bariatric surgery which she qualifies for.    DVT prophylaxis: Apixaban Code Status: Full Family Communication:  Status is: Inpatient    Dispo: The patient is from: Home              Anticipated d/c is to: Home              Anticipated d/c date is: 4/14              Patient currently unstable  Consultants:    Procedures/Significant Events:    I have personally reviewed and interpreted all radiology studies and my findings are as above.  VENTILATOR SETTINGS:    Cultures 4/11 urine negative   Antimicrobials: Anti-infectives (From admission, onward)   Start      Ordered Stop   03/25/21 2200  cefdinir (OMNICEF) capsule 300 mg  Status:  Discontinued        03/25/21 0953 03/25/21 1614   03/25/21 2200  cefdinir (OMNICEF) capsule 300 mg        03/25/21 1614 03/28/21 0959   03/24/21 1545  cefdinir (OMNICEF) capsule 300 mg  Status:  Discontinued        03/24/21 1453 03/25/21 0953       Devices    LINES / TUBES:      Continuous Infusions: . sodium chloride 50 mL/hr at 03/24/21 1606     Objective: Vitals:   03/25/21 0002 03/25/21 0123 03/25/21 0436 03/25/21 0840  BP: (!) 131/42  (!) 116/46 (!) 138/58  Pulse: 67 66 62 60  Resp: 18 19 18    Temp: 98.2 F (36.8 C)  97.7 F (36.5 C)   TempSrc: Oral  Axillary   SpO2: 96% 97% 98%   Weight:   128.6 kg   Height:        Intake/Output Summary (Last 24 hours) at 03/25/2021 0901 Last data filed at 03/25/2021 0600 Gross per 24 hour  Intake 2389.57 ml  Output 300 ml  Net 2089.57 ml   Filed Weights   03/24/21 0041 03/24/21 0500 03/25/21 0436  Weight: 128.1 kg 128.1 kg 128.6 kg    Examination:  General: A/O x4, No acute respiratory distress Eyes: negative scleral hemorrhage, negative anisocoria, negative icterus ENT: Negative Runny nose, negative gingival bleeding, Neck:  Negative scars, masses, torticollis, lymphadenopathy, JVD Lungs: Clear to auscultation bilaterally without wheezes or crackles Cardiovascular: Regular rate and rhythm without murmur gallop or rub normal S1 and S2 Abdomen: OBESE, negative abdominal pain, nondistended, positive soft, bowel sounds, no rebound, no ascites, no appreciable mass Extremities: No significant cyanosis, clubbing, or edema bilateral lower extremities Skin: Negative rashes, lesions, ulcers Psychiatric:  Negative depression, negative anxiety, negative fatigue, negative mania  Central nervous system:  Cranial nerves II through XII intact, tongue/uvula midline, all extremities muscle strength 5/5, sensation intact throughout, negative dysarthria,  negative expressive aphasia, negative receptive aphasia.  .     Data Reviewed: Care during the described time interval was provided by me .  I have reviewed this patient's available data, including medical history, events of note, physical examination, and all test results as part of my evaluation.  CBC: Recent Labs  Lab 03/23/21 1616 03/24/21 0130 03/25/21 0100  WBC 11.6* 8.8 7.2  NEUTROABS 9.6*  --   --   HGB 9.7* 8.9* 8.9*  HCT 29.6* 26.9* 27.2*  MCV 94.9 95.7 96.8  PLT 310 276 313   Basic Metabolic Panel: Recent Labs  Lab 03/23/21 1616 03/24/21 0130 03/25/21 0100  NA 134* 134* 134*  K 4.4 3.4* 4.7  CL 95* 95* 98  CO2 23 26 27   GLUCOSE 61* 89 215*  BUN 50* 45* 39*  CREATININE 2.19* 1.95* 1.70*  CALCIUM 9.1 9.2 9.6  MG  --  1.8  --   PHOS  --  3.1  --    GFR: Estimated Creatinine Clearance: 33.2 mL/min (A) (by C-G formula based on SCr of 1.7 mg/dL (H)). Liver Function Tests: Recent Labs  Lab 03/23/21 1616 03/24/21 0130  AST 57* 48*  ALT 19 20  ALKPHOS 76 76  BILITOT 0.7 0.3  PROT 7.1 6.8  ALBUMIN 3.1* 2.9*   Recent Labs  Lab 03/23/21 1616  LIPASE 28   No results for input(s): AMMONIA in the last 168 hours. Coagulation Profile: No results for input(s): INR, PROTIME in the last 168 hours. Cardiac Enzymes: Recent Labs  Lab 03/23/21 1616  CKTOTAL 493*   BNP (last 3 results) No results for input(s): PROBNP in the last 8760 hours. HbA1C: No results for input(s): HGBA1C in the last 72 hours. CBG: Recent Labs  Lab 03/24/21 0635 03/24/21 2054 03/25/21 0011 03/25/21 0439 03/25/21 0726  GLUCAP 112* 211* 244* 229* 213*   Lipid Profile: No results for input(s): CHOL, HDL, LDLCALC, TRIG, CHOLHDL, LDLDIRECT in the last 72 hours. Thyroid Function Tests: No results for input(s): TSH, T4TOTAL, FREET4, T3FREE, THYROIDAB in the last 72 hours. Anemia Panel: No results for input(s): VITAMINB12, FOLATE, FERRITIN, TIBC, IRON, RETICCTPCT in the last 72  hours. Sepsis Labs: No results for input(s): PROCALCITON, LATICACIDVEN in the last 168 hours.  Recent Results (from the past 240 hour(s))  SARS CORONAVIRUS 2 (TAT 6-24 HRS) Nasopharyngeal Nasopharyngeal Swab     Status: None   Collection Time: 03/23/21  7:49 PM   Specimen: Nasopharyngeal Swab  Result Value Ref Range Status   SARS Coronavirus 2 NEGATIVE NEGATIVE Final    Comment: (NOTE) SARS-CoV-2 target nucleic acids are NOT DETECTED.  The SARS-CoV-2 RNA is generally detectable in upper and lower respiratory specimens during the acute phase of infection. Negative results do not preclude SARS-CoV-2 infection, do not rule out co-infections with other pathogens, and should not be used as the sole basis for treatment or other patient management decisions. Negative results must be combined with clinical observations, patient history, and epidemiological information. The expected result is Negative.  Fact Sheet for Patients: HairSlick.nohttps://www.fda.gov/media/138098/download  Fact Sheet for Healthcare Providers: quierodirigir.comhttps://www.fda.gov/media/138095/download  This test is not yet approved or cleared by the [place] FDA and  has been authorized for detection and/or diagnosis of SARS-CoV-2 by FDA under an Emergency Use Authorization (EUA). This EUA will remain  in effect (meaning this test can be used) for the duration of the COVID-19 declaration under Se ction 564(b)(1) of the Act, 21 U.S.C. section 360bbb-3(b)(1), unless the authorization is terminated or revoked sooner.  Performed at [facility], [address] [place], [place] [address]   Culture, Urine     Status: None   Collection Time: 03/23/21  9:11 PM   Specimen: Urine, Random  Result Value Ref Range Status   Specimen Description URINE, RANDOM  Final   Special Requests NONE  Final   Culture   Final    Consistent with normal respiratory flora. Performed at [facility], [address] [place], [place] [address]     Report Status 03/25/2021 FINAL  Final         Radiology Studies: CT Abdomen Pelvis Wo Contrast  Result Date: 03/23/2021 CLINICAL DATA:  Abdominal distension. EXAM: CT ABDOMEN AND PELVIS WITHOUT CONTRAST TECHNIQUE: Multidetector CT imaging of the abdomen and pelvis was performed following the standard protocol without IV contrast. COMPARISON:  CT 11/04/2020 FINDINGS: Lower chest: No acute airspace disease or pleural fluid. Pacemaker wires partially included. Hepatobiliary: The liver is enlarged spanning 22.6 cm cranial caudal. No focal liver abnormality on noncontrast exam. Gallbladder physiologically distended, no calcified stone. No biliary dilatation. Pancreas: Fatty atrophy.  Insert Spleen: Normal in size without  focal abnormality. Adrenals/Urinary Tract: Normal adrenal glands. No hydronephrosis. No urolithiasis. Minimal left greater than right perinephric edema. Partially distended urinary bladder. No bladder wall thickening. Stomach/Bowel: Small hiatal hernia. Ingested material in the stomach. No small bowel obstruction or inflammation. Multiple cecum located in the central abdomen. No abnormal distention or wall thickening. Appendix not confidently visualized. Moderate volume of colonic stool. Mild colonic tortuosity scattered colonic diverticulosis without diverticulitis. Vascular/Lymphatic: Aorto bi-iliac atherosclerosis. No aneurysm. No bulky abdominopelvic adenopathy. Reproductive: Status post hysterectomy. No adnexal masses. Other: No free air, free fluid, or intra-abdominal fluid collection. Musculoskeletal: Grade 1 anterolisthesis of L4 on L5 likely facet mediated. Multilevel facet hypertrophy in the lumbar spine. Bones are subjectively under mineralized. No acute osseous abnormalities are seen. IMPRESSION: 1. No acute abnormality in the abdomen/pelvis. 2. Hepatomegaly. 3. Mobile cecum. Moderate volume of colonic stool. Colonic diverticulosis without diverticulitis. Aortic Atherosclerosis  (ICD10-I70.0). Electronically Signed   By: Narda Rutherford M.D.   On: 03/23/2021 20:45   DG Chest 2 View  Result Date: 03/23/2021 CLINICAL DATA:  Fall. EXAM: CHEST - 2 VIEW COMPARISON:  October 20, 2020. FINDINGS: The heart size and mediastinal contours are within normal limits. Both lungs are clear. Left-sided pacemaker is noted in good position. No pneumothorax or pleural effusion is noted. The visualized skeletal structures are unremarkable. IMPRESSION: No active cardiopulmonary disease. Aortic Atherosclerosis (ICD10-I70.0). Electronically Signed   By: Lupita Raider M.D.   On: 03/23/2021 16:48   CT Head Wo Contrast  Result Date: 03/23/2021 CLINICAL DATA:  Weakness, falls EXAM: CT HEAD WITHOUT CONTRAST TECHNIQUE: Contiguous axial images were obtained from the base of the skull through the vertex without intravenous contrast. COMPARISON:  None. FINDINGS: Brain: No evidence of acute infarction, hemorrhage, hydrocephalus, extra-axial collection or mass lesion/mass effect. Periventricular and deep white matter hypodensity. Vascular: No hyperdense vessel or unexpected calcification. Skull: Normal. Negative for fracture or focal lesion. Sinuses/Orbits: No acute finding. Other: None. IMPRESSION: No acute intracranial pathology. Small-vessel white matter disease. Electronically Signed   By: Lauralyn Primes M.D.   On: 03/23/2021 18:01        Scheduled Meds: . apixaban  2.5 mg Oral BID  . buPROPion  100 mg Oral Daily  . cefdinir  300 mg Oral Daily  . cholecalciferol  1,000 Units Oral Daily  . diltiazem  120 mg Oral Daily  . ferrous sulfate  325 mg Oral Q breakfast  . levothyroxine  50 mcg Oral QAC breakfast  . metoprolol succinate  25 mg Oral Daily  . multivitamin  1 tablet Oral Daily  . pantoprazole  40 mg Oral BID AC  . pravastatin  10 mg Oral q1800  . vitamin B-12  1,000 mcg Oral Daily   Continuous Infusions: . sodium chloride 50 mL/hr at 03/24/21 1606     LOS: 2 days    Time spent:40  min    Alyzza Andringa, Roselind Messier, MD [facility]   If 7PM-7AM, please contact night-coverage 03/25/2021, 9:01 AM

## 2021-03-25 NOTE — Progress Notes (Signed)
Patient refused CPAP for the night  

## 2021-03-25 NOTE — Telephone Encounter (Signed)
LVM for pt to call me back to schedule sleep study  

## 2021-03-25 NOTE — Progress Notes (Signed)
OT Cancellation Note  Patient Details Name: Kirsten Jensen MRN: [id number] DOB: 02/11/1932   Cancelled Treatment:    Reason Eval/Treat Not Completed: Patient declined, no reason specified Therapist arrived and discussed agenda for today's session to cover energy conservation strategies and get OOB to recliner for her lunch. Pt stated "no, I am not doing that today", despite max encouragement. Therapist left energy conservation strategy handout for pt. OT will return later as time allows and pt is appropriate.   [facility] OTR/L Acute Rehabilitation Services Office: [phone]   [name] 03/25/2021, 11:30 AM

## 2021-03-26 LAB — CBC WITH DIFFERENTIAL/PLATELET
Abs Immature Granulocytes: 0.17 10*3/uL — ABNORMAL HIGH (ref 0.00–0.07)
Basophils Absolute: 0 10*3/uL (ref 0.0–0.1)
Basophils Relative: 1 %
Eosinophils Absolute: 0.1 10*3/uL (ref 0.0–0.5)
Eosinophils Relative: 1 %
HCT: 25.6 % — ABNORMAL LOW (ref 36.0–46.0)
Hemoglobin: 8.4 g/dL — ABNORMAL LOW (ref 12.0–15.0)
Immature Granulocytes: 2 %
Lymphocytes Relative: 14 %
Lymphs Abs: 1 10*3/uL (ref 0.7–4.0)
MCH: 31.8 pg (ref 26.0–34.0)
MCHC: 32.8 g/dL (ref 30.0–36.0)
MCV: 97 fL (ref 80.0–100.0)
Monocytes Absolute: 0.7 10*3/uL (ref 0.1–1.0)
Monocytes Relative: 10 %
Neutro Abs: 5 10*3/uL (ref 1.7–7.7)
Neutrophils Relative %: 72 %
Platelets: 327 10*3/uL (ref 150–400)
RBC: 2.64 MIL/uL — ABNORMAL LOW (ref 3.87–5.11)
RDW: 14.2 % (ref 11.5–15.5)
WBC: 7 10*3/uL (ref 4.0–10.5)
nRBC: 0 % (ref 0.0–0.2)

## 2021-03-26 LAB — COMPREHENSIVE METABOLIC PANEL
ALT: 16 U/L (ref 0–44)
AST: 24 U/L (ref 15–41)
Albumin: 2.6 g/dL — ABNORMAL LOW (ref 3.5–5.0)
Alkaline Phosphatase: 65 U/L (ref 38–126)
Anion gap: 9 (ref 5–15)
BUN: 29 mg/dL — ABNORMAL HIGH (ref 8–23)
CO2: 27 mmol/L (ref 22–32)
Calcium: 9.5 mg/dL (ref 8.9–10.3)
Chloride: 101 mmol/L (ref 98–111)
Creatinine, Ser: 1.27 mg/dL — ABNORMAL HIGH (ref 0.44–1.00)
GFR, Estimated: 41 mL/min — ABNORMAL LOW (ref >60)
Glucose, Bld: 132 mg/dL — ABNORMAL HIGH (ref 70–99)
Potassium: 4.4 mmol/L (ref 3.5–5.1)
Sodium: 137 mmol/L (ref 135–145)
Total Bilirubin: 0.6 mg/dL (ref 0.3–1.2)
Total Protein: 6.2 g/dL — ABNORMAL LOW (ref 6.5–8.1)

## 2021-03-26 LAB — LIPID PANEL
Cholesterol: 127 mg/dL (ref 0–200)
HDL: 21 mg/dL — ABNORMAL LOW (ref >40)
LDL Cholesterol: 78 mg/dL (ref 0–99)
Total CHOL/HDL Ratio: 6 RATIO
Triglycerides: 142 mg/dL (ref <150)
VLDL: 28 mg/dL (ref 0–40)

## 2021-03-26 LAB — PHOSPHORUS: Phosphorus: 3.3 mg/dL (ref 2.5–4.6)

## 2021-03-26 LAB — GLUCOSE, CAPILLARY
Glucose-Capillary: 138 mg/dL — ABNORMAL HIGH (ref 70–99)
Glucose-Capillary: 146 mg/dL — ABNORMAL HIGH (ref 70–99)
Glucose-Capillary: 239 mg/dL — ABNORMAL HIGH (ref 70–99)
Glucose-Capillary: 323 mg/dL — ABNORMAL HIGH (ref 70–99)

## 2021-03-26 LAB — MAGNESIUM: Magnesium: 1.7 mg/dL (ref 1.7–2.4)

## 2021-03-26 MED ORDER — GLIMEPIRIDE 1 MG PO TABS
1.0000 mg | ORAL_TABLET | Freq: Every day | ORAL | Status: DC
  Filled 2021-03-26: qty 1

## 2021-03-26 MED ORDER — ONDANSETRON HCL 4 MG PO TABS: 4.0000 mg | ORAL_TABLET | Freq: Four times a day (QID) | ORAL | 0 refills | Status: DC | PRN

## 2021-03-26 MED ORDER — APIXABAN 5 MG PO TABS: 5.0000 mg | ORAL_TABLET | Freq: Two times a day (BID) | ORAL | 2 refills | Status: DC

## 2021-03-26 MED ORDER — APIXABAN 5 MG PO TABS: 5.0000 mg | ORAL_TABLET | Freq: Two times a day (BID) | ORAL | Status: DC

## 2021-03-26 MED ORDER — PRAVASTATIN SODIUM 10 MG PO TABS: 20.0000 mg | ORAL_TABLET | Freq: Every day | ORAL | Status: DC

## 2021-03-26 MED ORDER — GLIMEPIRIDE 1 MG PO TABS: 1.0000 mg | ORAL_TABLET | ORAL | Status: DC

## 2021-03-26 MED ORDER — APIXABAN 2.5 MG PO TABS: 2.5000 mg | ORAL_TABLET | Freq: Two times a day (BID) | ORAL | 0 refills | Status: DC

## 2021-03-26 MED ORDER — APIXABAN 5 MG PO TABS: 5.0000 mg | ORAL_TABLET | Freq: Two times a day (BID) | ORAL | 0 refills | Status: DC

## 2021-03-26 MED ORDER — CEFDINIR 300 MG PO CAPS: 300.0000 mg | ORAL_CAPSULE | Freq: Two times a day (BID) | ORAL | 0 refills | Status: DC

## 2021-03-26 MED ORDER — GLIMEPIRIDE 1 MG PO TABS: 1.0000 mg | ORAL_TABLET | Freq: Every day | ORAL | 0 refills | Status: DC

## 2021-03-26 MED ORDER — PRAVASTATIN SODIUM 20 MG PO TABS: 20.0000 mg | ORAL_TABLET | Freq: Every day | ORAL | 0 refills | Status: DC

## 2021-03-26 MED ORDER — ASCORBIC ACID 500 MG PO TABS: 500.0000 mg | ORAL_TABLET | Freq: Every day | ORAL | 0 refills | Status: DC

## 2021-03-26 NOTE — Progress Notes (Signed)
RN called [facility] to give report no answer was sent to VM will try again shortly.

## 2021-03-26 NOTE — [place] FL2 (Addendum)
[place] MEDICAID FL2 LEVEL OF CARE SCREENING TOOL     IDENTIFICATION  Patient Name: Kirsten Jensen Birthdate: December 11, 1932 Sex: female Admission Date (Current Location): 03/23/2021  [facility] and [place] Number:  Producer, television/film/video and Address:  [facility], [address] [place], [place] [address]      Provider Number:    Attending Physician Name and Address:  Drema Dallas, MD  Relative Name and Phone Number:       Current Level of Care: Hospital Recommended Level of Care: Assisted Living Facility Prior Approval Number:    Date Approved/Denied:   PASRR Number:    Discharge Plan: Other (Comment) (Assisted Living at [facility])    Current Diagnoses: Patient Active Problem List   Diagnosis Date Noted  . Diabetes mellitus type 2, controlled, with complications (HCC) 03/25/2021  . Diabetic nephropathy (HCC) 03/25/2021  . AF (paroxysmal atrial fibrillation) (HCC) 03/25/2021  . Acute lower UTI 03/25/2021  . Acute renal failure superimposed on stage 3b chronic kidney disease (HCC) 03/23/2021  . Hypothyroidism 03/23/2021  . Abdominal pain with vomiting 03/23/2021  . Dysuria 03/23/2021  . Chronic diastolic CHF (congestive heart failure) (HCC)   . Gastrointestinal hemorrhage 10/31/2020  . Chronic atrial fibrillation (HCC)   . Advanced nonexudative age-related macular degeneration of both eyes with subfoveal involvement 09/11/2020  . Exudative age-related macular degeneration of both eyes with inactive scar (HCC) 09/11/2020  . Diabetes mellitus without complication (HCC) 09/11/2020  . Chest pain 04/05/2020  . Type 2 diabetes mellitus with hypoglycemia without coma (HCC) 04/05/2020  . HTN (hypertension) 04/05/2020  . Dyslipidemia 04/05/2020  . Morbid obesity with BMI of 45.0-49.9, adult (HCC) 04/05/2020  . Tachycardia-bradycardia syndrome (HCC) 03/17/2020  . Pacemaker 03/17/2020  . Atypical atrial flutter (HCC) 02/05/2020  . Paroxysmal  atrial fibrillation (HCC) 11/06/2019  . Secondary hypercoagulable state (HCC) 11/06/2019    Orientation RESPIRATION BLADDER Height & Weight        Normal Continent Weight: 128.5 kg Height:  5\' 7"  (170.2 cm)  BEHAVIORAL SYMPTOMS/MOOD NEUROLOGICAL BOWEL NUTRITION STATUS      Continent Diet  AMBULATORY STATUS COMMUNICATION OF NEEDS Skin     Verbally Normal                       Personal Care Assistance Level of Assistance  Bathing,Feeding,Dressing Bathing Assistance: Limited assistance Feeding assistance: Limited assistance Dressing Assistance: Limited assistance     Functional Limitations Info  Sight Sight Info: Adequate (legally blind ( patient states))        SPECIAL CARE FACTORS FREQUENCY                       Contractures Contractures Info: Not present    Additional Factors Info  Code Status,Allergies Code Status Info: Full Allergies Info: No known Allergies           Current Medications (03/26/2021):  This is the current hospital active medication list Current Facility-Administered Medications  Medication Dose Route Frequency Provider Last Rate Last Admin  . 0.9 %  sodium chloride infusion   Intravenous Continuous 03/28/2021, MD 50 mL/hr at 03/24/21 1606 New Bag at 03/24/21 1606  . acetaminophen (TYLENOL) tablet 650 mg  650 mg Oral Q6H PRN Opyd, 05/24/21, MD   650 mg at 03/25/21 1645   Or  . acetaminophen (TYLENOL) suppository 650 mg  650 mg Rectal Q6H PRN Opyd, 03/27/21, MD      .  apixaban (ELIQUIS) tablet 2.5 mg  2.5 mg Oral BID Opyd, Lavone Neri, MD   2.5 mg at 03/26/21 0842  . ascorbic acid (VITAMIN C) tablet 500 mg  500 mg Oral Daily Drema Dallas, MD   500 mg at 03/26/21 7867  . buPROPion [facility]) tablet 100 mg  100 mg Oral Daily Opyd, Lavone Neri, MD   100 mg at 03/26/21 0843  . cefdinir (OMNICEF) capsule 300 mg  300 mg Oral Q12H Drema Dallas, MD   300 mg at 03/26/21 6720  . cholecalciferol (VITAMIN D3) tablet 1,000 Units   1,000 Units Oral Daily Coralie Keens, MD   1,000 Units at 03/26/21 0841  . diltiazem (CARDIZEM CD) 24 hr capsule 120 mg  120 mg Oral Daily Opyd, Lavone Neri, MD   120 mg at 03/26/21 0843  . ferrous sulfate tablet 325 mg  325 mg Oral Q breakfast Arrien, York Ram, MD   325 mg at 03/26/21 0841  . glimepiride (AMARYL) tablet 1 mg  1 mg Oral Q supper Drema Dallas, MD      . glimepiride (AMARYL) tablet 2 mg  2 mg Oral Daily Drema Dallas, MD   2 mg at 03/26/21 [phone]  . insulin aspart (novoLOG) injection 0-9 Units  0-9 Units Subcutaneous Q4H Drema Dallas, MD   7 Units at 03/26/21 0908  . levothyroxine (SYNTHROID) tablet 50 mcg  50 mcg Oral QAC breakfast Opyd, Lavone Neri, MD   50 mcg at 03/26/21 0543  . metoprolol succinate (TOPROL-XL) 24 hr tablet 25 mg  25 mg Oral Daily Opyd, Lavone Neri, MD   25 mg at 03/26/21 0841  . multivitamin (PROSIGHT) tablet 1 tablet  1 tablet Oral Daily Ulyses Southward Q, RPH-CPP   1 tablet at 03/26/21 0841  . ondansetron (ZOFRAN) tablet 4 mg  4 mg Oral Q6H PRN Opyd, Lavone Neri, MD       Or  . ondansetron (ZOFRAN) injection 4 mg  4 mg Intravenous Q6H PRN Opyd, Lavone Neri, MD      . pantoprazole (PROTONIX) EC tablet 40 mg  40 mg Oral BID AC Opyd, Lavone Neri, MD   40 mg at 03/26/21 0843  . pravastatin (PRAVACHOL) tablet 20 mg  20 mg Oral q1800 Drema Dallas, MD      . vitamin B-12 (CYANOCOBALAMIN) tablet 1,000 mcg  1,000 mcg Oral Daily Coralie Keens, MD   1,000 mcg at 03/26/21 0841     Discharge Medications: acetaminophen 500 MG tablet Commonly known as: TYLENOL Take 500 mg by mouth 2 (two) times daily as needed for mild pain.   acetaminophen-codeine 300-30 MG tablet Commonly known as: TYLENOL #3 Take 1 tablet by mouth 3 (three) times daily as needed for moderate pain.   apixaban 2.5 MG Tabs tablet Commonly known as: ELIQUIS Take 1 tablet (2.5 mg total) by mouth 2 (two) times daily. What changed:   medication strength  how much to take    ascorbic acid 500 MG tablet Commonly known as: VITAMIN C Take 1 tablet (500 mg total) by mouth daily. Start taking on: March 27, 2021   buPROPion 100 MG tablet Commonly known as: WELLBUTRIN Take 100 mg by mouth daily.   cefdinir 300 MG capsule Commonly known as: OMNICEF Take 1 capsule (300 mg total) by mouth every 12 (twelve) hours.   cholecalciferol 25 MCG (1000 UNIT) tablet Commonly known as: VITAMIN D3 Take 1,000 Units by mouth daily.   diltiazem 120 MG 24  hr tablet Commonly known as: CARDIZEM LA Take 120 mg by mouth daily.   ferrous sulfate 325 (65 FE) MG tablet Take 1 tablet (325 mg total) by mouth daily with breakfast.   glimepiride 2 MG tablet Commonly known as: AMARYL Take 2 mg by mouth daily. What changed: Another medication with the same name was added. Make sure you understand how and when to take each.   glimepiride 1 MG tablet Commonly known as: AMARYL Take 1 tablet (1 mg total) by mouth daily with supper. What changed: You were already taking a medication with the same name, and this prescription was added. Make sure you understand how and when to take each.   ketoconazole 2 % shampoo Commonly known as: NIZORAL Apply 1 application topically 3 (three) times a week. Let sit for 3 minutes before rinsing.   Tuesday Thursday Saturday   levothyroxine 50 MCG tablet Commonly known as: SYNTHROID Take 50 mcg by mouth daily before breakfast.   magnesium oxide 400 MG tablet Commonly known as: MAG-OX Take 1 tablet (400 mg total) by mouth daily.   metoprolol succinate 25 MG 24 hr tablet Commonly known as: Toprol XL Take 1 tablet (25 mg total) by mouth daily.   OCUVITE ADULT 50+ PO Take 1 capsule by mouth daily.   ondansetron 4 MG tablet Commonly known as: ZOFRAN Take 1 tablet (4 mg total) by mouth every 6 (six) hours as needed for nausea.   pantoprazole 40 MG tablet Commonly known as: PROTONIX Take 1 tablet (40 mg total) by mouth 2 (two)  times daily before a meal.   pravastatin 20 MG tablet Commonly known as: PRAVACHOL Take 1 tablet (20 mg total) by mouth daily at 6 PM. What changed:   medication strength  how much to take  when to take this   vitamin B-12 1000 MCG tablet Commonly known as: CYANOCOBALAMIN Take 1,000 mcg by mouth daily.        Relevant Imaging Results:  Relevant Lab Results:   Additional Information Type 2 DM, DX Acute kidney injury on chronic Kidney disease  [place] Prout, Adria Devon, RN

## 2021-03-26 NOTE — TOC Progression Note (Addendum)
Transition of Care [facility]) - Progression Note    Patient Details  Name: Kirsten Jensen MRN: [id number] Date of Birth: 1932-03-06  Transition of [facility]) CM/SW Contact  Nadene Rubins Adria Devon, RN Phone Number: 03/26/2021, 11:35 AM  Clinical Narrative:     Patient to return to [facility] today .  Called Terry at  [employer] [address] [phone] and left message. Kandee Keen with [facility] aware patient discharging today.   Will fax discharge summary and FL2 once Terri calls back with fax number   Nurse to call report to [phone] after discharge summary and FL2 are faxed.  Nurse and patient aware.   When ready patient states son will provide transportation back to [employer] again spoke with Terri, faxed FL2 and discharge summary to [phone]  Nurse can call report to [phone] and ask for Terri  Expected Discharge Plan: Assisted Living    Expected Discharge Plan and Services Expected Discharge Plan: Assisted Living     Post Acute Care Choice: Home Health Living arrangements for the past 2 months: Apartment Expected Discharge Date: 03/26/21               DME Arranged: N/A         HH Arranged: PT HH Agency: [facility] Date [facility] Agency Contacted: 03/24/21 Time HH Agency Contacted: [phone] Representative spoke with at [facility] Agency: Kandee Keen   Social Determinants of Health (SDOH) Interventions    Readmission Risk Interventions No flowsheet data found.

## 2021-03-26 NOTE — Progress Notes (Signed)
Occupational Therapy Treatment Patient Details Name: Kirsten Jensen MRN: [id number] DOB: 1932/10/23 Today's Date: 03/26/2021    History of present illness Pt is an 87 y/o female admitted 4/11 from ALF secondary to ARF, nausea/vomiting, fatigue and falls. PMH includes HTN, a fib, DM, CKD, dCHF, and CAD.   OT comments  Pt received sitting in recliner, finishing her lunch. Pt adamantly declined mobility this session despite education on importance of mobilizing. Pt was very adamant about finishing her TV show at this time. Session focused on educating pt on importance of mobility, fall prevention and use of energy conservation strategies with provided handout. Pt will continue to benefit from skilled OT services to maximize safety and independence with ADL/IADL and functional mobility. Will continue to follow acutely and progress as tolerated.    Follow Up Recommendations  Home health OT;Supervision - Intermittent    Equipment Recommendations  3 in 1 bedside commode    Recommendations for Other Services      Precautions / Restrictions Precautions Precautions: Fall Precaution Comments: Slipped off of bed and toilet prior to admission. Restrictions Weight Bearing Restrictions: No       Mobility Bed Mobility               General bed mobility comments: pt sitting in recliner upon arrival    Transfers                 General transfer comment: pt declined    Balance Overall balance assessment: Needs assistance Sitting-balance support: No upper extremity supported;Feet supported Sitting balance-Leahy Scale: Good Sitting balance - Comments: sitting upright in recliner, able to reposition herself in the chair       Standing balance comment: pt declined mobility this date                           ADL either performed or assessed with clinical judgement   ADL Overall ADL's : Needs assistance/impaired Eating/Feeding: Independent Eating/Feeding Details  (indicate cue type and reason): pt finishing lunch upon arrival                                   General ADL Comments: pt declined mobility this session, focused on education regarding energy conservation strategies with provided handout. Pt did not recall any ECS at this time.     Vision       Perception     Praxis      Cognition Arousal/Alertness: Awake/alert Behavior During Therapy: WFL for tasks assessed/performed Overall Cognitive Status: Within Functional Limits for tasks assessed                                 General Comments: pt with self limiting behaviors, adamant about finishing her show despite education on importance of mobility        Exercises     Shoulder Instructions       General Comments      Pertinent Vitals/ Pain       Pain Assessment: No/denies pain  Home Living                                          Prior Functioning/Environment  Frequency  Min 2X/week        Progress Toward Goals  OT Goals(current goals can now be found in the care plan section)  Progress towards OT goals: Progressing toward goals  Acute Rehab OT Goals Patient Stated Goal: to go home today OT Goal Formulation: With patient Time For Goal Achievement: 04/07/21 Potential to Achieve Goals: Good ADL Goals Pt Will Perform Grooming: with modified independence;standing Pt Will Perform Lower Body Dressing: with modified independence;with adaptive equipment;sit to/from stand Pt Will Transfer to Toilet: with modified independence;ambulating Additional ADL Goal #1: Pt will demonstrate independence with 3 energy conservation strategies during ADL/IADL and functional mobility.  Plan Discharge plan remains appropriate    Co-evaluation                 AM-PAC OT "6 Clicks" Daily Activity     Outcome Measure   Help from another person eating meals?: None Help from another person taking care of  personal grooming?: A Little Help from another person toileting, which includes using toliet, bedpan, or urinal?: A Little Help from another person bathing (including washing, rinsing, drying)?: A Little Help from another person to put on and taking off regular upper body clothing?: None Help from another person to put on and taking off regular lower body clothing?: A Little 6 Click Score: 20    End of Session    OT Visit Diagnosis: Other abnormalities of gait and mobility (R26.89);Muscle weakness (generalized) (M62.81)   Activity Tolerance Other (comment) (session limited secondary to pt's self limiting behaviors)   Patient Left with call bell/phone within reach;in chair   Nurse Communication Mobility status        Time: 3903-0092 OT Time Calculation (min): 11 min  Charges: OT General Charges $OT Visit: 1 Visit OT Treatments $Self Care/Home Management : 8-22 mins  [name] OTR/L Acute Rehabilitation Services Office: [phone]    [name] 03/26/2021, 12:48 PM

## 2021-03-26 NOTE — Discharge Summary (Addendum)
Physician Discharge Summary  Kirsten Jensen YQM:[id number] DOB: 26-Jan-1932 DOA: 03/23/2021  PCP: Melida Quitter, MD  Admit date: 03/23/2021 Discharge date: 03/26/2021  Time spent: 35 minutes  Recommendations for Outpatient Follow-up:   Covid vaccination; vaccinated 2/3: Ordered patient's booster vaccination Pfizer    Acute on CKD stage IIIb (baseline Cr1.16--1.6)/DM nephropathy Lab Results  Component Value Date   CREATININE 1.27 (H) 03/26/2021   CREATININE 1.70 (H) 03/25/2021   CREATININE 1.95 (H) 03/24/2021   CREATININE 2.19 (H) 03/23/2021   CREATININE 1.61 (H) 02/17/2021  -Most likely secondary to UTI and poor diabetic control. -4/13 given patient's CKD stage IIIb would not restart METFORMIN at discharge. -Hold all nephrotoxic medication -Resolved at baseline   UTI (POA)  -Urinary symptoms resolved.  Negative abdominal pain, pyuria -Complete 5-day course cefdinir  DM type II controlled with complication/diabetic nephropathy -3/8 hemoglobin A1c= 6.9 -Glimepiride 2 mg daily breakfast/1 mg daily @ 1700 -4/13 consult to nutritionist for education on diabetic diet   HLD -Increase Pravastatin 20 mg daily -4/14 LDL = 78: Goal <70   Chronic diastolic CHF -Strict in and out +4.4 L -Daily weight Filed Weights   03/24/21 0500 03/25/21 0436 03/26/21 0440  Weight: 128.1 kg 128.6 kg 128.5 kg  -Cardizem CD 120 mg daily -ToproI 25 mg daily -Torsemide, spironolactone held.  Patient's PCP/cardiologist to decide when/if to restart additional medication. -Schedule follow-up with Dr. Sherryl Manges cardiology in 2 to 4 weeks, chronic diastolic CHF, paroxysmal A. fib,  Paroxysmal atrial fibrillation -Apixaban 5 mg BID -See CHF  Essential HTN -See CHF  Hypothyroidism -Synthroid 50 mcg daily  Iron deficiency anemia -Iron 325 mg daily -Vitamin C 500 mg daily  Depression -Wellbutrin 100 mg daily  Morbidly obese ( Body mass index is 44.4 kg/m.) -PCP should speak  with patient concerning weight loss strategies to include bariatric surgery which she qualifies for. -Schedule follow-up appointment with PCP Dr. Dorinda Hill in 2 to 4 weeks, HTN, bariatric surgery, DM type II controlled with complication   Discharge Diagnoses:  Principal Problem:   Acute renal failure superimposed on stage 3b chronic kidney disease (HCC) Active Problems:   Paroxysmal atrial fibrillation (HCC)   Type 2 diabetes mellitus with hypoglycemia without coma (HCC)   HTN (hypertension)   Chronic diastolic CHF (congestive heart failure) (HCC)   Hypothyroidism   Abdominal pain with vomiting   Dysuria   Diabetes mellitus type 2, controlled, with complications (HCC)   Diabetic nephropathy (HCC)   AF (paroxysmal atrial fibrillation) (HCC)   Acute lower UTI   Discharge Condition: Stable  Diet recommendation: Heart healthy/carb modified  Filed Weights   03/24/21 0500 03/25/21 0436 03/26/21 0440  Weight: 128.1 kg 128.6 kg 128.5 kg    History of present illness:  [name] was admitted to the hospital with the working diagnosis of acute kidney injury on chronic kidney disease stage IIIb.  87 year old WF PMHx CAD, type 2 diabetes mellitus, hypertension, atrial fibrillation, hypothyroidism and CKD stage IIIb,  Presented with abdominal pain, nausea vomitingandfatigue. Persistent symptoms for about 4 days, lower abdominal painassociated dysuria, andsignificant decreased p.o. intakebutno diarrhea or fevers. On her initial physical examination blood pressure 128/67, heart rate 64, respiratory rate 19, oxygen saturation 96%,lungs are clear to auscultation bilaterally, heart S1-S2, present rhythmic, soft abdomen, no lower extremity edema.  Sodium 134, potassium 4.4, chloride 95, bicarb 23, glucose 61, BUN 50, creatinine 2.19, CK 493, white count 11.6, hemoglobin 9.7, hematocrit 29.6, platelets 310. SARS COVID-19 negative.  Urinalysis specific gravity  1.009,>50white  blood cells, 6-10 red cells. Negative protein.  Chest radiograph no infiltrates. EKG 74 bpm, normal axis, right bundle branch block, sinus rhythm with PACs, Q-wave lead I-aVL, no significant ST segment or T wave changes, positive Uwaves.  Hospital Course:  See above   Cultures   4/11 urine negative  Antibiotics Anti-infectives (From admission, onward)   Start     Ordered Stop   03/25/21 2200  cefdinir (OMNICEF) capsule 300 mg  Status:  Discontinued        03/25/21 0953 03/25/21 1614   03/25/21 2200  cefdinir (OMNICEF) capsule 300 mg        03/25/21 1614 03/28/21 0959   03/24/21 1545  cefdinir (OMNICEF) capsule 300 mg  Status:  Discontinued        03/24/21 1453 03/25/21 0953       Discharge Exam: Vitals:   03/25/21 1858 03/25/21 2017 03/26/21 0003 03/26/21 0440  BP: (!) 136/42 (!) 134/45 (!) 132/50 (!) 144/81  Pulse: (!) 59 60 64 62  Resp: Temp: 97.8 F (36.6 C) 98 F (36.7 C) 98.4 F (36.9 C) 98.3 F (36.8 C)  TempSrc: Oral Oral Oral Oral  SpO2: 97% 98% 97% 97%  Weight:    128.5 kg  Height:        General: A/O x4, No acute respiratory distress Eyes: negative scleral hemorrhage, negative anisocoria, negative icterus ENT: Negative Runny nose, negative gingival bleeding, Neck:  Negative scars, masses, torticollis, lymphadenopathy, JVD Lungs: Clear to auscultation bilaterally without wheezes or crackles Cardiovascular: Regular rate and rhythm without murmur gallop or rub normal S1 and S2 Abdomen: OBESE, negative abdominal pain, nondistended, positive soft, bowel sounds, no rebound, no ascites, no appreciable mass   Discharge Instructions   Allergies as of 03/26/2021   No Known Allergies     Medication List    STOP taking these medications   gemfibrozil 600 MG tablet Commonly known as: LOPID   metFORMIN 500 MG 24 hr tablet Commonly known as: GLUCOPHAGE-XR   ramipril 5 MG capsule Commonly known as: ALTACE   spironolactone 25 MG  tablet Commonly known as: ALDACTONE   torsemide 20 MG tablet Commonly known as: DEMADEX     TAKE these medications   acetaminophen 500 MG tablet Commonly known as: TYLENOL Take 500 mg by mouth 2 (two) times daily as needed for mild pain.   acetaminophen-codeine 300-30 MG tablet Commonly known as: TYLENOL #3 Take 1 tablet by mouth 3 (three) times daily as needed for moderate pain.   apixaban 2.5 MG Tabs tablet Commonly known as: ELIQUIS Take 1 tablet (2.5 mg total) by mouth 2 (two) times daily. What changed:   medication strength  how much to take   ascorbic acid 500 MG tablet Commonly known as: VITAMIN C Take 1 tablet (500 mg total) by mouth daily. Start taking on: March 27, 2021   buPROPion 100 MG tablet Commonly known as: WELLBUTRIN Take 100 mg by mouth daily.   cefdinir 300 MG capsule Commonly known as: OMNICEF Take 1 capsule (300 mg total) by mouth every 12 (twelve) hours.   cholecalciferol 25 MCG (1000 UNIT) tablet Commonly known as: VITAMIN D3 Take 1,000 Units by mouth daily.   diltiazem 120 MG 24 hr tablet Commonly known as: CARDIZEM LA Take 120 mg by mouth daily.   ferrous sulfate 325 (65 FE) MG tablet Take 1 tablet (325 mg total) by mouth daily with breakfast.   glimepiride 2 MG tablet Commonly known  as: AMARYL Take 2 mg by mouth daily. What changed: Another medication with the same name was added. Make sure you understand how and when to take each.   glimepiride 1 MG tablet Commonly known as: AMARYL Take 1 tablet (1 mg total) by mouth daily with supper. What changed: You were already taking a medication with the same name, and this prescription was added. Make sure you understand how and when to take each.   ketoconazole 2 % shampoo Commonly known as: NIZORAL Apply 1 application topically 3 (three) times a week. Let sit for 3 minutes before rinsing.   Tuesday Thursday Saturday   levothyroxine 50 MCG tablet Commonly known as: SYNTHROID Take  50 mcg by mouth daily before breakfast.   magnesium oxide 400 MG tablet Commonly known as: MAG-OX Take 1 tablet (400 mg total) by mouth daily.   metoprolol succinate 25 MG 24 hr tablet Commonly known as: Toprol XL Take 1 tablet (25 mg total) by mouth daily.   OCUVITE ADULT 50+ PO Take 1 capsule by mouth daily.   ondansetron 4 MG tablet Commonly known as: ZOFRAN Take 1 tablet (4 mg total) by mouth every 6 (six) hours as needed for nausea.   pantoprazole 40 MG tablet Commonly known as: PROTONIX Take 1 tablet (40 mg total) by mouth 2 (two) times daily before a meal.   pravastatin 20 MG tablet Commonly known as: PRAVACHOL Take 1 tablet (20 mg total) by mouth daily at 6 PM. What changed:   medication strength  how much to take  when to take this   vitamin B-12 1000 MCG tablet Commonly known as: CYANOCOBALAMIN Take 1,000 mcg by mouth daily.      No Known Allergies  Follow-up Information    Duke SalviaKlein, Steven C, MD. Schedule an appointment as soon as possible for a visit in 4 week(s).   Specialty: Cardiology Why: Schedule follow-up with Dr. Sherryl MangesSteven Klein cardiology in 2 to 4 weeks, chronic diastolic CHF, paroxysmal A. fib, Contact information: [address] [place] [address] [phone]        Melida QuitterWile, Laura H, MD. Schedule an appointment as soon as possible for a visit in 4 week(s).   Specialty: Internal Medicine Why: Schedule follow-up appointment with PCP Dr. Dorinda HillLaura Wile in 2 to 4 weeks, HTN, bariatric surgery, DM type II controlled with complication Contact information: [address] [place] [id number] [phone]                The results of significant diagnostics from this hospitalization (including imaging, microbiology, ancillary and laboratory) are listed below for reference.    Significant Diagnostic Studies: CT Abdomen Pelvis Wo Contrast  Result Date: 03/23/2021 CLINICAL DATA:  Abdominal distension. EXAM: CT ABDOMEN AND  PELVIS WITHOUT CONTRAST TECHNIQUE: Multidetector CT imaging of the abdomen and pelvis was performed following the standard protocol without IV contrast. COMPARISON:  CT 11/04/2020 FINDINGS: Lower chest: No acute airspace disease or pleural fluid. Pacemaker wires partially included. Hepatobiliary: The liver is enlarged spanning 22.6 cm cranial caudal. No focal liver abnormality on noncontrast exam. Gallbladder physiologically distended, no calcified stone. No biliary dilatation. Pancreas: Fatty atrophy.  Insert Spleen: Normal in size without focal abnormality. Adrenals/Urinary Tract: Normal adrenal glands. No hydronephrosis. No urolithiasis. Minimal left greater than right perinephric edema. Partially distended urinary bladder. No bladder wall thickening. Stomach/Bowel: Small hiatal hernia. Ingested material in the stomach. No small bowel obstruction or inflammation. Multiple cecum located in the central abdomen. No abnormal distention or  wall thickening. Appendix not confidently visualized. Moderate volume of colonic stool. Mild colonic tortuosity scattered colonic diverticulosis without diverticulitis. Vascular/Lymphatic: Aorto bi-iliac atherosclerosis. No aneurysm. No bulky abdominopelvic adenopathy. Reproductive: Status post hysterectomy. No adnexal masses. Other: No free air, free fluid, or intra-abdominal fluid collection. Musculoskeletal: Grade 1 anterolisthesis of L4 on L5 likely facet mediated. Multilevel facet hypertrophy in the lumbar spine. Bones are subjectively under mineralized. No acute osseous abnormalities are seen. IMPRESSION: 1. No acute abnormality in the abdomen/pelvis. 2. Hepatomegaly. 3. Mobile cecum. Moderate volume of colonic stool. Colonic diverticulosis without diverticulitis. Aortic Atherosclerosis (ICD10-I70.0). Electronically Signed   By: Narda Rutherford M.D.   On: 03/23/2021 20:45   DG Chest 2 View  Result Date: 03/23/2021 CLINICAL DATA:  Fall. EXAM: CHEST - 2 VIEW COMPARISON:   October 20, 2020. FINDINGS: The heart size and mediastinal contours are within normal limits. Both lungs are clear. Left-sided pacemaker is noted in good position. No pneumothorax or pleural effusion is noted. The visualized skeletal structures are unremarkable. IMPRESSION: No active cardiopulmonary disease. Aortic Atherosclerosis (ICD10-I70.0). Electronically Signed   By: Lupita Raider M.D.   On: 03/23/2021 16:48   CT Head Wo Contrast  Result Date: 03/23/2021 CLINICAL DATA:  Weakness, falls EXAM: CT HEAD WITHOUT CONTRAST TECHNIQUE: Contiguous axial images were obtained from the base of the skull through the vertex without intravenous contrast. COMPARISON:  None. FINDINGS: Brain: No evidence of acute infarction, hemorrhage, hydrocephalus, extra-axial collection or mass lesion/mass effect. Periventricular and deep white matter hypodensity. Vascular: No hyperdense vessel or unexpected calcification. Skull: Normal. Negative for fracture or focal lesion. Sinuses/Orbits: No acute finding. Other: None. IMPRESSION: No acute intracranial pathology. Small-vessel white matter disease. Electronically Signed   By: Lauralyn Primes M.D.   On: 03/23/2021 18:01   CUP PACEART REMOTE DEVICE CHECK  Result Date: 03/10/2021 Scheduled remote reviewed. Normal device function.  1 AF episode, 15 min. OAC- Eliquis Next remote 91 days- JBox, RN/CVRS   Microbiology: Recent Results (from the past 240 hour(s))  SARS CORONAVIRUS 2 (TAT 6-24 HRS) Nasopharyngeal Nasopharyngeal Swab     Status: None   Collection Time: 03/23/21  7:49 PM   Specimen: Nasopharyngeal Swab  Result Value Ref Range Status   SARS Coronavirus 2 NEGATIVE NEGATIVE Final    Comment: (NOTE) SARS-CoV-2 target nucleic acids are NOT DETECTED.  The SARS-CoV-2 RNA is generally detectable in upper and lower respiratory specimens during the acute phase of infection. Negative results do not preclude SARS-CoV-2 infection, do not rule out co-infections with other  pathogens, and should not be used as the sole basis for treatment or other patient management decisions. Negative results must be combined with clinical observations, patient history, and epidemiological information. The expected result is Negative.  Fact Sheet for Patients: HairSlick.no  Fact Sheet for Healthcare Providers: quierodirigir.com  This test is not yet approved or cleared by the [place] FDA and  has been authorized for detection and/or diagnosis of SARS-CoV-2 by FDA under an Emergency Use Authorization (EUA). This EUA will remain  in effect (meaning this test can be used) for the duration of the COVID-19 declaration under Se ction 564(b)(1) of the Act, 21 U.S.C. section 360bbb-3(b)(1), unless the authorization is terminated or revoked sooner.  Performed at [facility], [address] [place], [place] [address]   Culture, Urine     Status: None   Collection Time: 03/23/21  9:11 PM   Specimen: Urine, Random  Result Value Ref Range Status   Specimen Description  URINE, RANDOM  Final   Special Requests NONE  Final   Culture   Final    Consistent with normal respiratory flora. Performed at [facility], [address] [place], [place] [address]    Report Status 03/25/2021 FINAL  Final     Labs: Basic Metabolic Panel: Recent Labs  Lab 03/23/21 1616 03/24/21 0130 03/25/21 0100 03/26/21 0203  NA 134* 134* 134* 137  K 4.4 3.4* 4.7 4.4  CL 95* 95* 98 101  CO2 23 26 27 27   GLUCOSE 61* 89 215* 132*  BUN 50* 45* 39* 29*  CREATININE 2.19* 1.95* 1.70* 1.27*  CALCIUM 9.1 9.2 9.6 9.5  MG  --  1.8  --  1.7  PHOS  --  3.1  --  3.3   Liver Function Tests: Recent Labs  Lab 03/23/21 1616 03/24/21 0130 03/26/21 0203  AST 57* 48* 24  ALT 19 20 16   ALKPHOS 76 76 65  BILITOT 0.7 0.3 0.6  PROT 7.1 6.8 6.2*  ALBUMIN 3.1* 2.9* 2.6*   Recent Labs  Lab 03/23/21 1616  LIPASE 28   No results  for input(s): AMMONIA in the last 168 hours. CBC: Recent Labs  Lab 03/23/21 1616 03/24/21 0130 03/25/21 0100 03/26/21 0203  WBC 11.6* 8.8 7.2 7.0  NEUTROABS 9.6*  --   --  5.0  HGB 9.7* 8.9* 8.9* 8.4*  HCT 29.6* 26.9* 27.2* 25.6*  MCV 94.9 95.7 96.8 97.0  PLT 310 276 313 327   Cardiac Enzymes: Recent Labs  Lab 03/23/21 1616  CKTOTAL 493*   BNP: BNP (last 3 results) Recent Labs    10/30/20 1505  BNP 628.9*    ProBNP (last 3 results) No results for input(s): PROBNP in the last 8760 hours.  CBG: Recent Labs  Lab 03/25/21 1634 03/25/21 2021 03/26/21 0007 03/26/21 0435 03/26/21 0859  GLUCAP 328* 204* 138* 146* 323*       Signed:  03/28/21, MD [facility]

## 2021-03-26 NOTE — Plan of Care (Signed)
RD consulted for nutrition education regarding diabetes.   Lab Results  Component Value Date   HGBA1C 6.9 (H) 02/17/2021   RD provided "Carbohydrate Counting for People with Diabetes" handout from the Academy of Nutrition and Dietetics. Discussed different food groups and their effects on blood sugar, emphasizing carbohydrate-containing foods. Provided list of carbohydrates and recommended serving sizes of common foods.  Discussed importance of controlled and consistent carbohydrate intake throughout the day. Provided examples of ways to balance meals/snacks and encouraged intake of high-fiber, whole grain complex carbohydrates. Teach back method used.  Pt resides at an ALF where her meals are prepared for her. Encouraged her to speak with chef and food service employees about her new dietary needs. Also informed that she can request a visit from dietitian at the facility if she still has questions about her options.  Expect good compliance.  Body mass index is 44.37 kg/m. Pt meets criteria for obese class III based on current BMI.  Current diet order is carb modified, patient is consuming approximately 78% of meals at this time. Labs and medications reviewed. No further nutrition interventions warranted at this time. RD contact information provided. If additional nutrition issues arise, please re-consult RD.  Greig Castilla, RD, LDN Clinical Dietitian Pager on Amion

## 2021-03-26 NOTE — Progress Notes (Signed)
RT received call patient now wants to try CPAP.  RT placed patient on CPAP.  Patient resting well.

## 2021-03-26 NOTE — Progress Notes (Signed)
Terri from [employer] called back and RN gave report to him about the patient returning. IV has been rmeoved and the patient has been told to get dressed and call her son.

## 2021-03-26 NOTE — Progress Notes (Signed)
PT Cancellation Note  Patient Details Name: Kirsten Jensen MRN: [id number] DOB: 01-17-1932   Cancelled Treatment:    Reason Eval/Treat Not Completed: (P) Patient declined, no reason specified (pt refusing HEP or mobility training due to upcoming discharge.)   Dorathy Kinsman [facility] 03/26/2021, 4:05 PM

## 2021-03-26 NOTE — Progress Notes (Signed)
RN called [employer] and spoke to receptionist, who stated that Terri, RN was in a meeting and would call me back shortly to get report.

## 2021-04-02 ENCOUNTER — Telehealth: Payer: Self-pay | Admitting: Physician Assistant

## 2021-04-02 NOTE — Telephone Encounter (Signed)
Spoke with pt who states she was recently discharged from the hospital for dehydration and hypoglycemia.  Pt's current weight is 284 and denies any new edema or SOB.  She is concerned because both of her diuretics were stopped and not restarted.  Pt reports she was previously on Torsemide and Spironolactone.  Pt is scheduled to see Dr Graciela Husbands 05/17 but would like to know what she should do about resuming diuretic in the mean time.   Will forward information to Francis Dowse, PA-C for review and recommendation.  Pt verbalizes understanding and agrees with current plan.

## 2021-04-02 NOTE — Telephone Encounter (Signed)
Pt c/o swelling: STAT is pt has developed SOB within 24 hours  1) How much weight have you gained and in what time span? States her weight fluctuates between 280-282  2) If swelling, where is the swelling located? Knees and lower thighs  3) Are you currently taking a fluid pill? no  4) Are you currently SOB? no  5) Do you have a log of your daily weights (if so, list)? Previous was 277, has been fluctuating from 280-282  6) Have you gained 3 pounds in a day or 5 pounds in a week? Does not believe so  7) Have you traveled recently? No    Patient states she was recently hospitalized. She states she was dehydrated and they took away her dieretic. She states she believes she is not taking on fluid. She states she is not having any other symptoms.

## 2021-04-03 NOTE — Telephone Encounter (Signed)
Spoke with pt and advised per Francis Dowse, PA-C for now continue close monitoring at home for weight gain, new edema and new or increasing SOB.  Plan to keep scheduled appt with Dr Graciela Husbands in May.  Pt verbalizes understanding and agrees with current plan.

## 2021-04-22 ENCOUNTER — Ambulatory Visit (INDEPENDENT_AMBULATORY_CARE_PROVIDER_SITE_OTHER): Payer: Medicare Other | Admitting: Neurology

## 2021-04-22 ENCOUNTER — Encounter: Payer: Medicare Other | Admitting: Internal Medicine

## 2021-04-22 DIAGNOSIS — R351 Nocturia: Secondary | ICD-10-CM

## 2021-04-22 DIAGNOSIS — G4733 Obstructive sleep apnea (adult) (pediatric): Secondary | ICD-10-CM

## 2021-04-22 DIAGNOSIS — I480 Paroxysmal atrial fibrillation: Secondary | ICD-10-CM

## 2021-04-22 DIAGNOSIS — G4719 Other hypersomnia: Secondary | ICD-10-CM

## 2021-04-22 DIAGNOSIS — I2510 Atherosclerotic heart disease of native coronary artery without angina pectoris: Secondary | ICD-10-CM

## 2021-04-27 NOTE — Progress Notes (Signed)
See procedure note.

## 2021-04-28 ENCOUNTER — Other Ambulatory Visit: Payer: Self-pay

## 2021-04-28 ENCOUNTER — Encounter: Payer: Self-pay | Admitting: Internal Medicine

## 2021-04-28 ENCOUNTER — Ambulatory Visit (INDEPENDENT_AMBULATORY_CARE_PROVIDER_SITE_OTHER): Payer: Medicare Other | Admitting: Internal Medicine

## 2021-04-28 VITALS — BP 150/90 | HR 60 | Ht 67.0 in | Wt 287.0 lb

## 2021-04-28 DIAGNOSIS — Z950 Presence of cardiac pacemaker: Secondary | ICD-10-CM | POA: Diagnosis not present

## 2021-04-28 DIAGNOSIS — I482 Chronic atrial fibrillation, unspecified: Secondary | ICD-10-CM | POA: Diagnosis not present

## 2021-04-28 DIAGNOSIS — I5032 Chronic diastolic (congestive) heart failure: Secondary | ICD-10-CM

## 2021-04-28 DIAGNOSIS — I2510 Atherosclerotic heart disease of native coronary artery without angina pectoris: Secondary | ICD-10-CM | POA: Diagnosis not present

## 2021-04-28 NOTE — Progress Notes (Signed)
Oh back to that lady is you right this is a 300 pound lady is that her hemoglobin is 9 point     Patient Care Team: Melida Quitter, MD as PCP - General (Internal Medicine) Duke Salvia, MD as PCP - Cardiology (Cardiology) Duke Salvia, MD as PCP - Electrophysiology (Cardiology)   HPI  Kirsten Jensen is a 87 y.o. female seen in follow-up for a Dual chamber [employer] pacemaker implanted 2019 for what was suspected tachybradycardia syndrome and atrial fibrillation anticoagulated with Eliquis  History of coronary artery disease.  She was stented about 15 years ago and again in 2018; on clopidogrel   Intercurrent hospitalization for upper GI bleed.  Hemoglobin dropped 11--8.  EGD demonstrated an ulcer and candidiasis.  Colonoscopy demonstrated hemorrhoids and diverticulosis with polyps.  She was transfused.  During the hospitalization she was noted to be in atrial flutter.  Reviewing electrocardiograms back to 2020-November, sinus rhythm was last appreciated 12/20 and every subsequent ECG has demonstrated atrial flutter  Hx of HFpEF   She is reasonable doing well. Her symptom from last visit has improved some. She reports being hospitalized in April for dehydration-she was already feeling bad and her diuresis worsened her symptoms.  She is not walking as much as she should, however after being hospitalized she has to attend physical therapy-she is look forward to this-No shortness of breath or exertional chest pain at this time.   Some peripheral edema(R>L), her right lower extremity also swells due to her fracture history.   The patient denies nocturnal dyspnea, or orthopnea. There have been no palpitations, lightheadedness or syncope.     DATE TEST EF   4/21 Echo   50 %   11/21 Echo   55-60 %     Date Cr K Hgb  2/21 1.10 4.8 10.9 (10/20)  11/21  0.87 4.0 8.8 >> 9.1  4/22 1.27 4.4 8.4   Thromboembolic risk factors ( age  -2, HTN-1, DM-1, Vasc disease -1, Gender-1)  for a CHADSVASc Score of >=6   Past Medical History:  Diagnosis Date  . AF (paroxysmal atrial fibrillation) (HCC) 03/25/2021  . Atrial fibrillation (HCC)   . Coronary artery disease    stenting x 2  . Diabetes (HCC)   . Dyslipidemia 04/05/2020  . Hypertension   . Morbid obesity (HCC)   . Pacemaker   . Paroxysmal atrial fibrillation (HCC) 11/06/2019  . Secondary hypercoagulable state (HCC) 11/06/2019  . T2DM (type 2 diabetes mellitus) (HCC) 04/05/2020  . Tachycardia-bradycardia syndrome (HCC) 03/17/2020    Past Surgical History:  Procedure Laterality Date  . COLONOSCOPY N/A 11/02/2020   Procedure: COLONOSCOPY;  Surgeon: Vida Rigger, MD;  Location: [facility] ENDOSCOPY;  Service: Endoscopy;  Laterality: N/A;  . ESOPHAGOGASTRODUODENOSCOPY N/A 11/01/2020   Procedure: ESOPHAGOGASTRODUODENOSCOPY (EGD);  Surgeon: Vida Rigger, MD;  Location: [facility] ENDOSCOPY;  Service: Endoscopy;  Laterality: N/A;    Current Meds  Medication Sig  . acetaminophen (TYLENOL) 500 MG tablet Take 500 mg by mouth 2 (two) times daily as needed for mild pain.   Marland Kitchen acetaminophen-codeine (TYLENOL #3) 300-30 MG tablet Take 1 tablet by mouth 3 (three) times daily as needed for moderate pain.  Marland Kitchen apixaban (ELIQUIS) 5 MG TABS tablet Take 1 tablet (5 mg total) by mouth 2 (two) times daily.  Marland Kitchen ascorbic acid (VITAMIN C) 500 MG tablet Take 1 tablet (500 mg total) by mouth daily.  Marland Kitchen buPROPion (WELLBUTRIN) 100 MG tablet Take 100 mg by mouth daily.  Marland Kitchen  cefdinir (OMNICEF) 300 MG capsule Take 1 capsule (300 mg total) by mouth every 12 (twelve) hours.  . cholecalciferol (VITAMIN D3) 25 MCG (1000 UNIT) tablet Take 1,000 Units by mouth daily.  Marland Kitchen diltiazem (CARDIZEM LA) 120 MG 24 hr tablet Take 120 mg by mouth daily.  Marland Kitchen glimepiride (AMARYL) 1 MG tablet Take 1 tablet (1 mg total) by mouth daily with supper.  Marland Kitchen glimepiride (AMARYL) 2 MG tablet Take 2 mg by mouth daily.  Marland Kitchen ketoconazole (NIZORAL) 2 % shampoo Apply 1 application topically 3 (three) times  a week. Let sit for 3 minutes before rinsing.   Tuesday Thursday Saturday  . levothyroxine (SYNTHROID) 50 MCG tablet Take 50 mcg by mouth daily before breakfast.  . magnesium oxide (MAG-OX) 400 MG tablet Take 1 tablet (400 mg total) by mouth daily.  . metoprolol succinate (TOPROL XL) 25 MG 24 hr tablet Take 1 tablet (25 mg total) by mouth daily.  . Multiple Vitamins-Minerals (OCUVITE ADULT 50+ PO) Take 1 capsule by mouth daily.  . ondansetron (ZOFRAN) 4 MG tablet Take 1 tablet (4 mg total) by mouth every 6 (six) hours as needed for nausea.  . pantoprazole (PROTONIX) 40 MG tablet Take 1 tablet (40 mg total) by mouth 2 (two) times daily before a meal.  . pravastatin (PRAVACHOL) 20 MG tablet Take 1 tablet (20 mg total) by mouth daily at 6 PM.  . vitamin B-12 (CYANOCOBALAMIN) 1000 MCG tablet Take 1,000 mcg by mouth daily.    No Known Allergies  Review of Systems negative except from HPI and PMH  Physical Exam BP (!) 150/90   Pulse 60   Ht 5\' 7"  (1.702 m)   Wt 287 lb (130.2 kg)   LMP  (LMP Unknown)   SpO2 95%   BMI 44.95 kg/m  Well developed and well nourished in no acute distress HENT normal Neck supple with JVP-flat Clear Device pocket well healed; without hematoma or erythema.  There is no tethering  Regular rate and rhythm, no gallop No murmur Abd-soft with active BS Clubbing cyanosis trace edema Skin-warm and dry A & Oriented  Grossly normal sensory and motor function  ECG A pacing 60 20/13/46   Assessment and  Plan Atrial fibrillation-persistent  Bradycardia-presumed tachybradycardia  Pacemaker-[employer]  HFpEF -chronic-diastolic  Coronary artery disease-prior stenting x2  Morbidly obese  Ventricular undersensing bipolar @ 2.5 mV  Trivial interval atrial fibrillation On Anticoagulant for thromboembolic risk reduction.  No bleeding issues.  Continue apixaban at 5 mg twice daily  Mild volume overload but with her recent hospitalization with overzealous  dehydration significantly decreased p.o. intake, her diuretics have been held.  I will reach out to her primary physician about using her diuretic on an as-needed basis based on weight.  In addition I will reach out to her regarding the use of an SGLT2 in the context of her HFpEF but also with her concomitant diabetes would need her expertise to help manage that.  Without symptoms of ischemia.  Continue Prevastatin,  and metoprolol diltiazem and she is using the anticoagulant and lieu of antiplatelet therapy  Lengthy discussion regarding her future whether here or in [place] 04-06-1977  Encouraged her to continue to work on her activity and her leg strength .    I,Alexis Bryant,acting as a scribe for [place], MD.,have documented all relevant documentation on the behalf of Sherryl Manges, MD,as directed by  Sherryl Manges, MD while in the presence of Sherryl Manges, MD.  I, Sherryl Manges, MD, have  reviewed all documentation for this visit. The documentation on 04/28/21 for the exam, diagnosis, procedures, and orders are all accurate and complete.

## 2021-04-28 NOTE — Patient Instructions (Addendum)
Medication Instructions:  Your physician recommends that you continue on your current medications as directed. Please refer to the Current Medication list given to you today.  *If you need a refill on your cardiac medications before your next appointment, please call your pharmacy*   Lab Work: None ordered.  If you have labs (blood work) drawn today and your tests are completely normal, you will receive your results only by: . MyChart Message (if you have MyChart) OR . A paper copy in the mail If you have any lab test that is abnormal or we need to change your treatment, we will call you to review the results.   Testing/Procedures: None ordered.    Follow-Up: At [facility], you and your health needs are our priority.  As part of our continuing mission to provide you with exceptional heart care, we have created designated Provider Care Teams.  These Care Teams include your primary Cardiologist (physician) and Advanced Practice Providers (APPs -  Physician Assistants and Nurse Practitioners) who all work together to provide you with the care you need, when you need it.  We recommend signing up for the patient portal called "MyChart".  Sign up information is provided on this After Visit Summary.  MyChart is used to connect with patients for Virtual Visits (Telemedicine).  Patients are able to view lab/test results, encounter notes, upcoming appointments, etc.  Non-urgent messages can be sent to your provider as well.   To learn more about what you can do with MyChart, go to [email or web address].    Your next appointment:   6 month(s)  The format for your next appointment:   In Person  Provider:   Dr Klein    

## 2021-04-30 NOTE — Addendum Note (Signed)
Addended by: Huston Foley on: 04/30/2021 05:52 PM   Modules accepted: Orders

## 2021-04-30 NOTE — Progress Notes (Signed)
Patient referred by Dr. Nadene Rubins, seen by me on 03/19/21 for reevaluation of her obstructive sleep apnea, prior sleep study was about 15 years ago.  She has been on CPAP therapy but needed new supplies, also, her machine may be affected by the recall.  She may be eligible for a new machine.    Please call and notify the patient that the recent home sleep test showed obstructive sleep apnea in the severe range. I recommend treatment in the form of autoPAP, which means, that we don't have to bring her in for a sleep study with CPAP, but will let her start using a so called autoPAP machine at home, which is a CPAP-like machine with self-adjusting pressures. We will send the order to a local DME company (of her choice, or as per insurance requirement). The DME representative will fit her with a mask, educate her on how to use the machine, how to put the mask on, etc. I have placed an order in the chart. Please send the order, talk to patient, send report to referring MD. We will need a FU in sleep clinic for 10 weeks post-PAP set up, please arrange that with me or one of our NPs. Also reinforce the need for compliance with treatment.  She has a CPAP machine, I am not sure if she has a local DME company.  We can keep the pressure close to her current pressure of 14 cm. Thanks,   Huston Foley, MD, PhD [facility])

## 2021-04-30 NOTE — Procedures (Signed)
[facility]  HOME SLEEP TEST (Watch PAT)  STUDY DATE: 04/22/21  DOB: 06-Feb-1932  MRN: [id number]  ORDERING CLINICIAN: Huston Foley, MD, PhD   REFERRING CLINICIAN: Melida Quitter, MD   CLINICAL INFORMATION/HISTORY: 87 year old woman with a history of diabetes, tachycardia-bradycardia syndrome, status post pacemaker insertion, history of atrial fibrillation, coronary artery disease, hypertension, hyperlipidemia, macular degeneration with legal blindness, and morbid obesity with a BMI of over 45, who was previously diagnosed with obstructive sleep apnea and placed on CPAP therapy.  She presents for reevaluation, her last sleep study was over 15 years ago.  Epworth sleepiness score: 10/24.  BMI: 44.3 kg/m  Neck Circumference: 15-5/8"  FINDINGS:   Total Record Time (hours, min): 8 H 50 min  Total Sleep Time (hours, min):  7 H 38 min   Percent REM (%):    18.12 %   Calculated pAHI (per hour): 59.9      REM pAHI: 69.1   NREM pAHI: 58.0 Supine AHI: 59.2   Oxygen Saturation (%) Mean: 90  Minimum oxygen saturation (%):        70   O2 Saturation Range (%): 70-98  O2Saturation (minutes) <=88%: 132.2 min  Pulse Mean (bpm):    61  Pulse Range (39-91)   IMPRESSION: OSA (obstructive sleep apnea), severe  RECOMMENDATION:  This home sleep test demonstrates severe obstructive sleep apnea with a total AHI of 59.9/hour and O2 nadir of 70%.  Moderate to loud snoring was detected.  Ongoing treatment with positive airway pressure is highly recommended.  The patient will be advised to proceed with treatment with an AutoPap machine at home.  A full night titration study may be considered to optimize treatment settings, if needed down the road. Please note that untreated obstructive sleep apnea may carry additional perioperative morbidity. Patients with significant obstructive sleep apnea should receive perioperative PAP therapy and the surgeons and particularly the anesthesiologist should  be informed of the diagnosis and the severity of the sleep disordered breathing. The patient should be cautioned not to drive, work at heights, or operate dangerous or heavy equipment when tired or sleepy. Review and reiteration of good sleep hygiene measures should be pursued with any patient. Other causes of the patient's symptoms, including circadian rhythm disturbances, an underlying mood disorder, medication effect and/or an underlying medical problem cannot be ruled out based on this test. Clinical correlation is recommended. The patient and her referring provider will be notified of the test results. The patient will be seen in follow up in sleep clinic at [facility].  I certify that I have reviewed the raw data recording prior to the issuance of this report in accordance with the standards of the American Academy of Sleep Medicine (AASM).  INTERPRETING PHYSICIAN:  Huston Foley, MD, PhD  Board Certified in Neurology and Sleep Medicine  [facility] [address], [address] [place], [place] [address] [phone]  Sleep Summary  Oxygen Saturation Statistics   Start Study Time: End Study Time: Total Recording Time:  9:06:20 PM 5:57:13 AM 8 h, 50 min  Total Sleep Time % REM of Sleep Time:  7 h, 38 min  18.1    Mean: 90 Minimum: 70 Maximum: 98  Mean of Desaturations Nadirs (%):   87  Oxygen Desatur. %: 4-9 10-20 >20 Total  Events Number Total  305 89.2  35 10.2  2 0.6  342 100.0  Oxygen Saturation: <90 <=88 <85 <80 <70  Duration (minutes): Sleep % 168.0 132.2  36.7 28.9 33.9 7.4 6.7 1.5 0.0 0.0     Respiratory Indices      Total Events REM NREM All Night  pRDI: pAHI 3%: ODI 4%: pAHIc 3%: % CSR: pAHI 4%:  430  430  342  133 27.0 342 69.1 69.1 54.8 27.0 58.0 58.0 46.2 19.1 59.9 59.9 47.6 20.6  47.6       Pulse Rate Statistics during Sleep (BPM)      Mean: 61 Minimum: 39 Maximum: 91        Body Position Statistics  Position Supine  Prone Right Left Non-Supine  Sleep (min) 120.5 298.5 21.5 17.5 337.5  Sleep % 26.3 65.2 4.7 3.8 73.7  pRDI 59.2 59.2 69.4 66.2 60.1  pAHI 3% 59.2 59.2 69.4 66.2 60.1  ODI 4% 48.4 48.4 34.7 41.4 47.3     Snoring Statistics Snoring Level (dB) >40 >50 >60 >70 >80 >Threshold (45)  Sleep (min) 281.6 40.5 10.6 0.0 0.0 76.5  Sleep % 61.5 8.8 2.3 0.0 0.0 16.7

## 2021-05-05 ENCOUNTER — Telehealth: Payer: Self-pay

## 2021-05-05 NOTE — Telephone Encounter (Signed)
-----   Message from Huston Foley, MD sent at 04/30/2021  5:52 PM EDT ----- Patient referred by Dr. Nadene Rubins, seen by me on 03/19/21 for reevaluation of her obstructive sleep apnea, prior sleep study was about 15 years ago.  She has been on CPAP therapy but needed new supplies, also, her machine may be affected by the recall.  She may be eligible for a new machine.    Please call and notify the patient that the recent home sleep test showed obstructive sleep apnea in the severe range. I recommend treatment in the form of autoPAP, which means, that we don't have to bring her in for a sleep study with CPAP, but will let her start using a so called autoPAP machine at home, which is a CPAP-like machine with self-adjusting pressures. We will send the order to a local DME company (of her choice, or as per insurance requirement). The DME representative will fit her with a mask, educate her on how to use the machine, how to put the mask on, etc. I have placed an order in the chart. Please send the order, talk to patient, send report to referring MD. We will need a FU in sleep clinic for 10 weeks post-PAP set up, please arrange that with me or one of our NPs. Also reinforce the need for compliance with treatment.  She has a CPAP machine, I am not sure if she has a local DME company.  We can keep the pressure close to her current pressure of 14 cm. Thanks,   Huston Foley, MD, PhD [facility])

## 2021-05-05 NOTE — Telephone Encounter (Signed)
I called pt. I advised pt that Dr. Frances Furbish reviewed their sleep study results and found that pt has severe osa. Dr. Frances Furbish recommends that pt start autopap for treatment. I reviewed PAP compliance expectations with the pt. Pt is agreeable to starting an auto-PAP. I advised pt that an order will be sent to a DME, [facility], and [employer] will call the pt within about one week after they file with the pt's insurance. [employer] will show the pt how to use the machine, fit for masks, and troubleshoot the auto-PAP if needed. Pt will CB once she is started on her autopap and we will schedule 31-90 f/u. A letter with all of this information in it will be mailed to the pt as a reminder. I verified with the pt that the address we have on file is correct. Pt verbalized understanding of results. Pt had no questions at this time but was encouraged to call back if questions arise. I have sent the order to [employer] and have received confirmation that they have received the order.

## 2021-05-06 NOTE — Telephone Encounter (Signed)
We received a voicemail from Kirsten Jensen in the sleep lab returning a call from Darleen Crocker RN. She advised that she tried to call [employer] about ordering her new machine and that she would be on a priority delivery list due to the severity of her apnea. They stated that insurance will not cover her new machine, as she is not due for a new machine until May 2024. She also advised that her current machine has been recalled. She advised that a friend of hers had the same issue, and that a letter from the doctor was sufficient to get insurance to cover the new machine.   Can you please call the patient back to discuss?

## 2021-05-06 NOTE — Telephone Encounter (Signed)
Patient called wanting to know if Dr. Frances Furbish would be willing to write an appeal letter to override the denial from the insurance because she is not due for new machine until 2024? Patient's current machine is one of the recalled models she has not been using it due to this.  Patient has not registered her machine at this time.  I advised patient I would send message to Dr. Frances Furbish asking about an appeal letter but to go ahead and register her machine for the recall so this process could already be taken care of.

## 2021-05-07 NOTE — Telephone Encounter (Signed)
Please advise patient that many patients are affected by the recall on their machines.  If the machine has been working well and patients have not been using a so clean or ozone based cleaning machine and have not had any significant heat exposure, and they are comfortable using the machine, they are encouraged to continue to using the current machines until a replacement is available through the makers of the machines.  Please encourage Kirsten Jensen to register Kirsten Jensen machine and guide Kirsten Jensen as to how to go about registering it through [employer].  Please advise Kirsten Jensen to get in touch with Kirsten Jensen DME company and have them update Kirsten Jensen as to when she may get another machine.  We can try to send a letter of appeal to Kirsten Jensen insurance but again, very many patients are affected by the recall on their machines at this time.  Please furnish a letter addressed to Kirsten Jensen insurance company requesting them to grant an exception and allow Kirsten Jensen to get a new positive airway pressure machine as Kirsten Jensen current machine is affected by a nationwide/worldwide recall.

## 2021-05-12 NOTE — Telephone Encounter (Signed)
I have reached out to [employer] on this and how to start the process since the authorization was completed through them.

## 2021-05-20 ENCOUNTER — Telehealth: Payer: Self-pay | Admitting: Internal Medicine

## 2021-05-20 NOTE — Telephone Encounter (Signed)
   Pt is planning to go on vacation she will be flying to another state. She wanted to ask [name] or Luster Landsberg if its ok for her to travel through air.

## 2021-05-20 NOTE — Telephone Encounter (Signed)
Spoke with patient and let her know it would be okay to take an airplane to her granddaughters bridal shower. Patient appreciative of return call.

## 2021-05-23 IMAGING — DX DG CHEST 2V
2 series · 2 of 2 positions shown · non-contrast
Comparison: March 2020

CLINICAL DATA: Cough

EXAM:
CHEST - 2 VIEW

[chest lat]
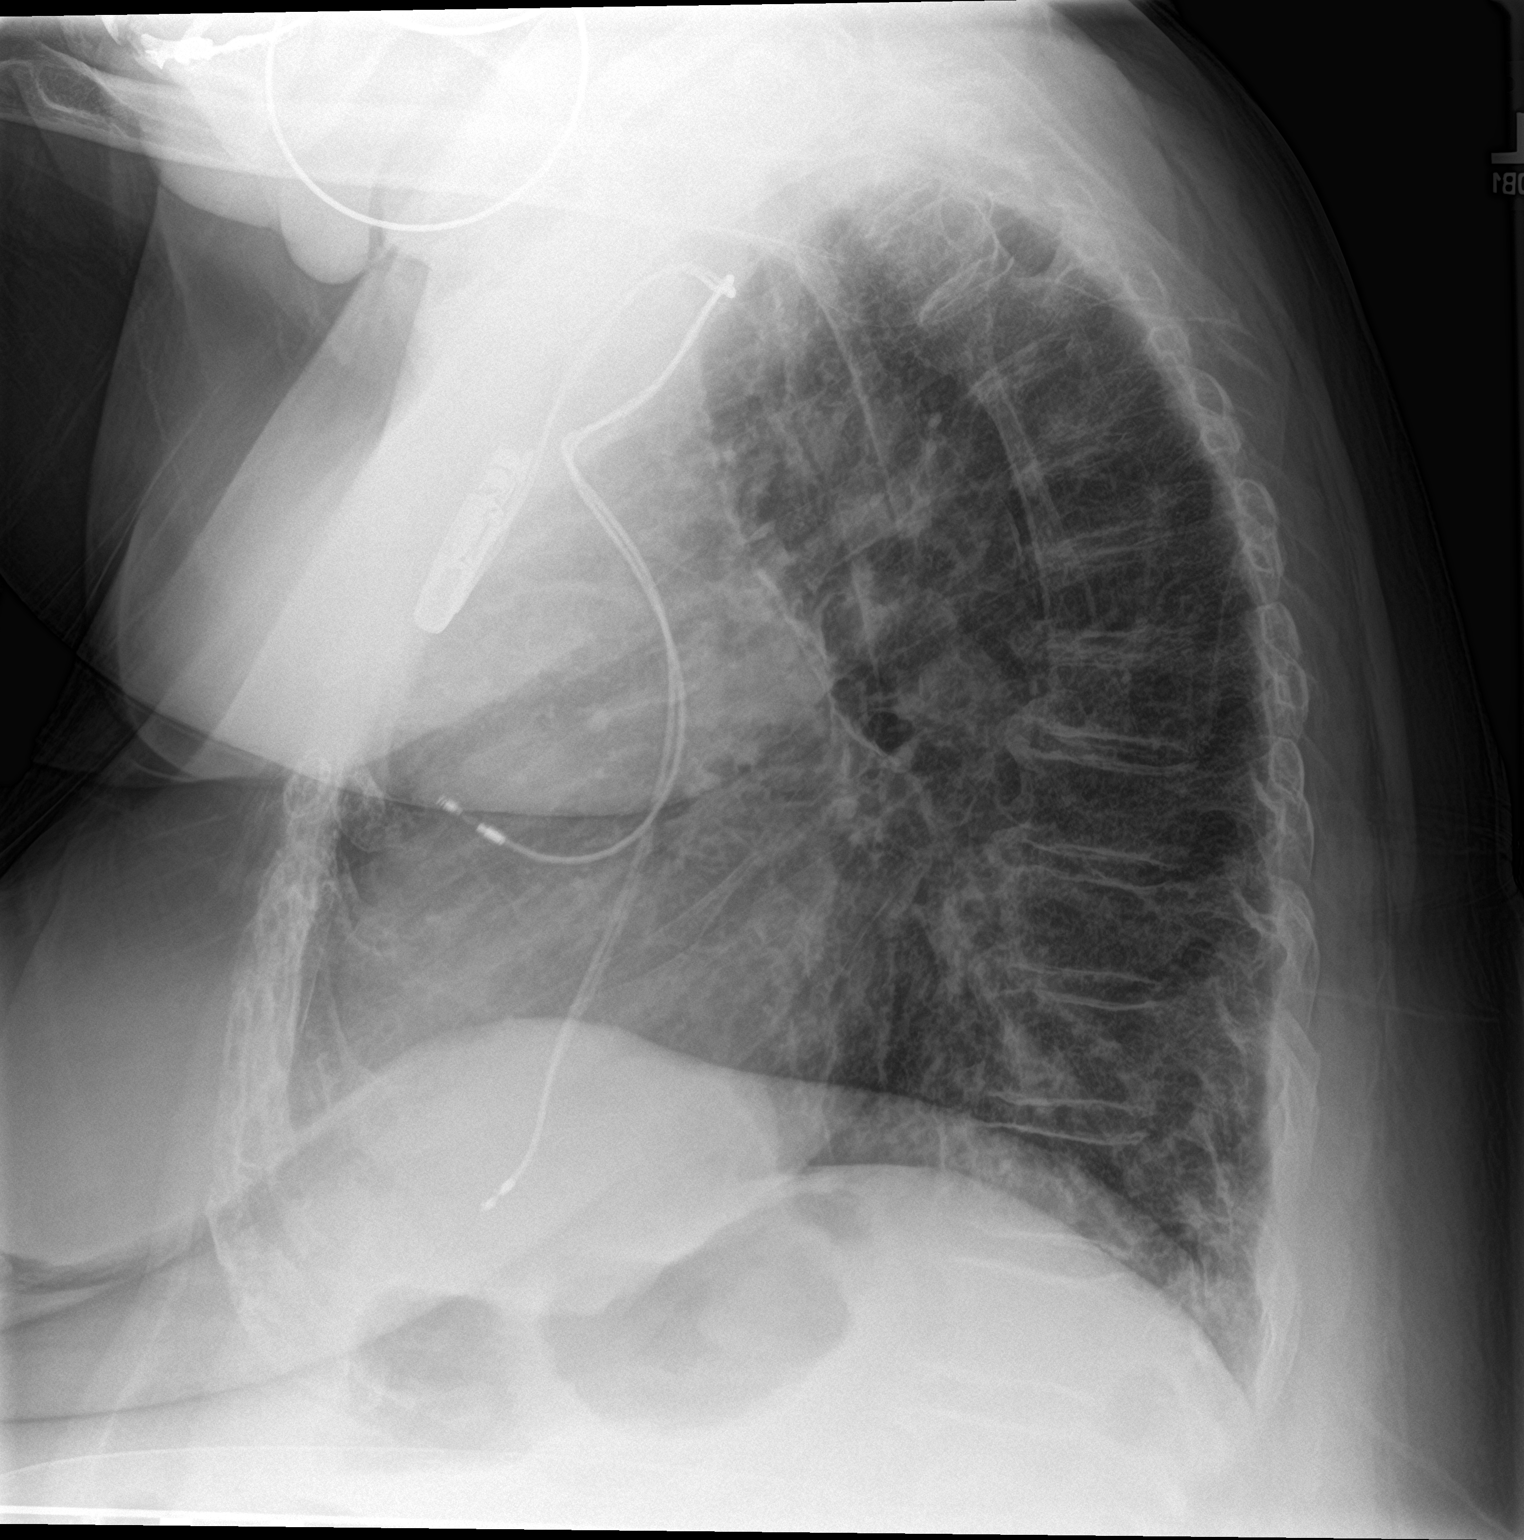

[chest ap]
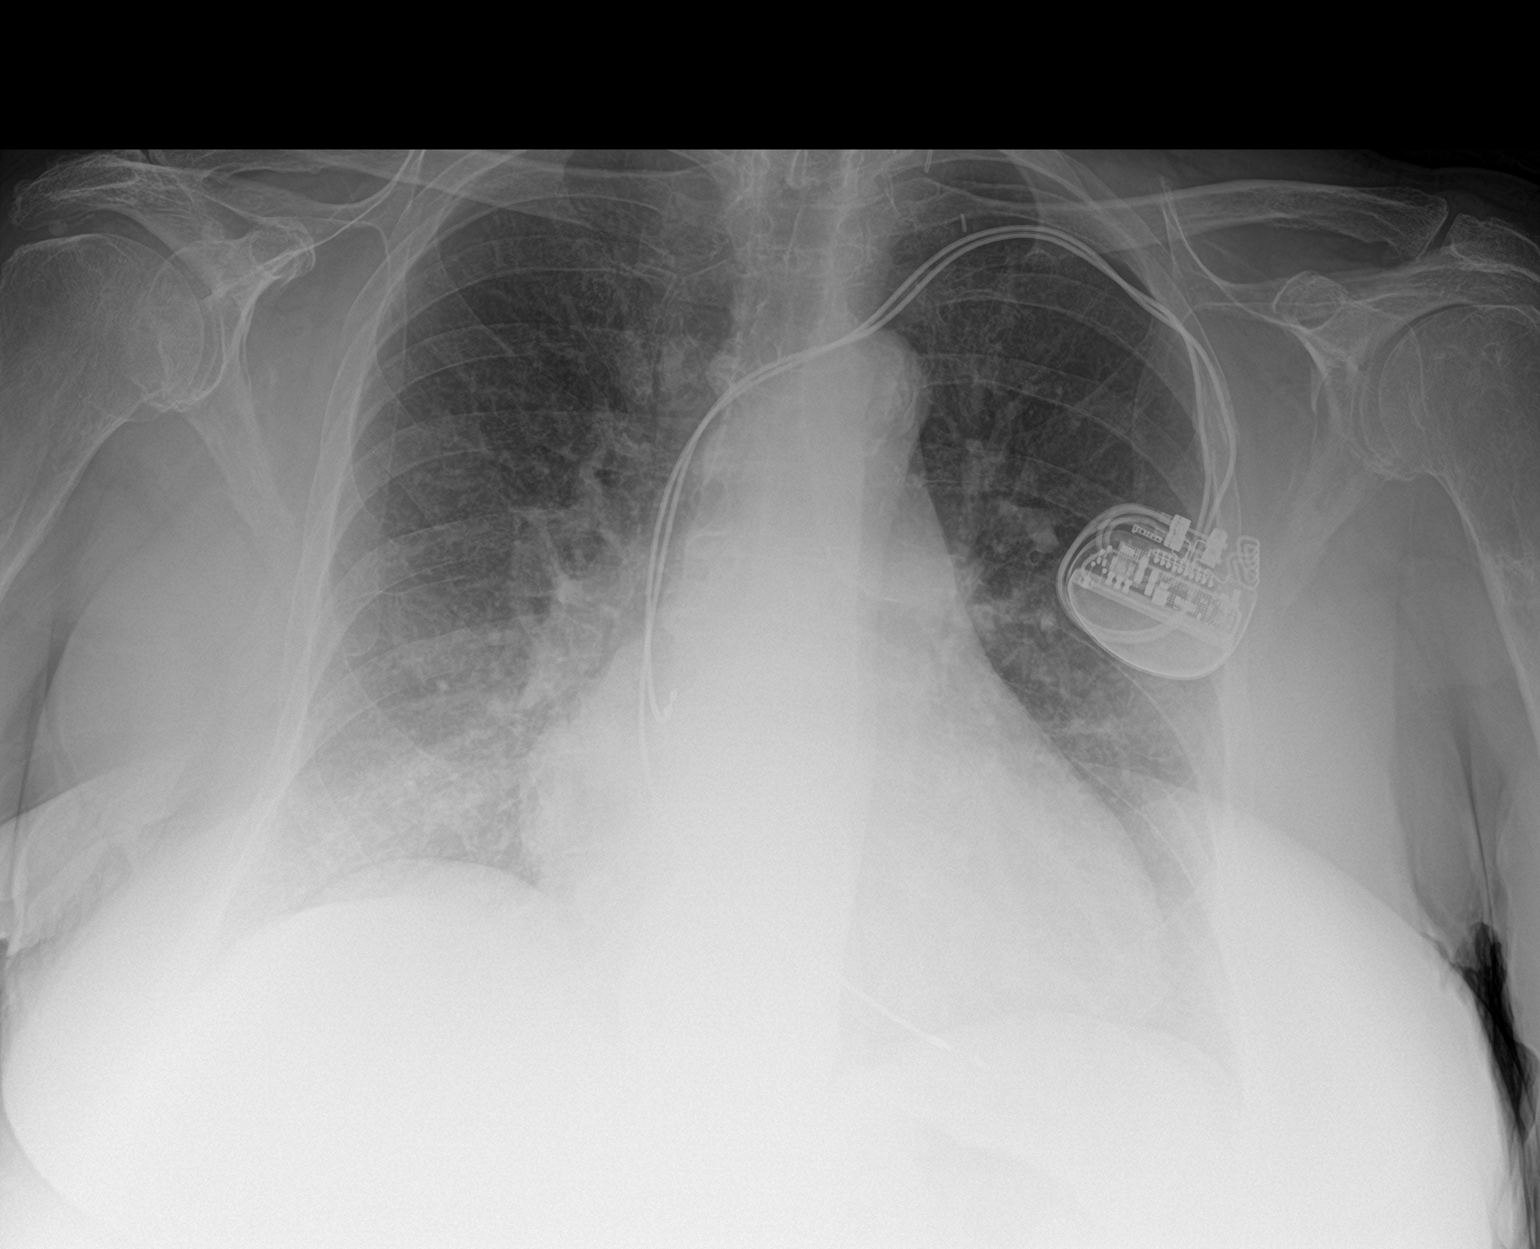

[2 of 2 positions shown; findings below may reference images not displayed]

FINDINGS: Mild interstitial prominence appears chronic. No pleural effusion.
Mild cardiomegaly. Left chest wall dual lead pacemaker. No acute
osseous abnormality.
IMPRESSION: Chronic interstitial changes.

## 2021-05-25 NOTE — Telephone Encounter (Signed)
Noted, thank you for the update

## 2021-05-25 NOTE — Telephone Encounter (Addendum)
I called the pt and relayed message from [employer]. She verbalized understanding and is working with [employer] to have her machine registered. She is also in contact with [employer] about a possible loner machine. Pt will CB if she has any other questions/concerns.

## 2021-05-25 NOTE — Telephone Encounter (Signed)
Received message back from [employer]  Barry Dienes, Roma Kayser, Carolynn Serve, RN; [place], Joetta Manners,   Unfortunately patient will need to reach out to [employer] and register the device. I did reach out to the Regional Manager to see if we could provide a loaner machine. Unfortunately there are no loaner machines available. Patient does have Medicare.

## 2021-05-27 IMAGING — CT CT ABD-PELV W/ CM
2 of 5 series · 16 of 46 positions shown, 18 images · IV contrast (APPLIED)
Comparison: None.

CLINICAL DATA: Anemia. Possible upper GI bleed. 14 pound weight
gain and chest discomfort. Candidiasis and duodenal ulcers
demonstrated at endoscopy.

EXAM:
CT ABDOMEN AND PELVIS WITH CONTRAST
TECHNIQUE: Multidetector CT imaging of the abdomen and pelvis was performed
using the standard protocol following bolus administration of
intravenous contrast.
CONTRAST:  100mL OMNIPAQUE IOHEXOL 300 MG/ML  SOLN

[Series 3: abd/ pelvis 5.0 i30f 2 · axial · 0.98mm/px · z∈[+897,+1327]mm · 13 of 96 slices shown, 15 images]
[im 5/96  soft-tissue]
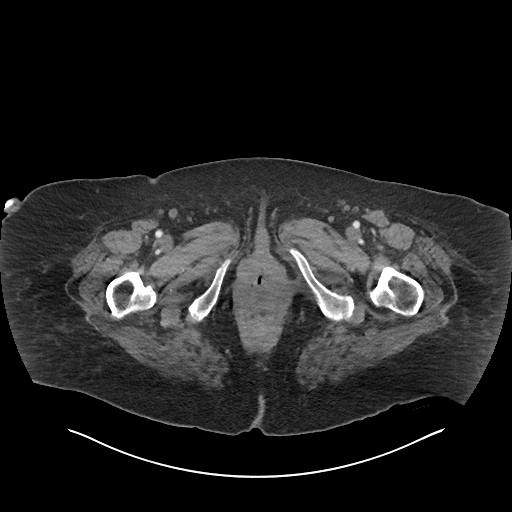
[im 5/96  bone]
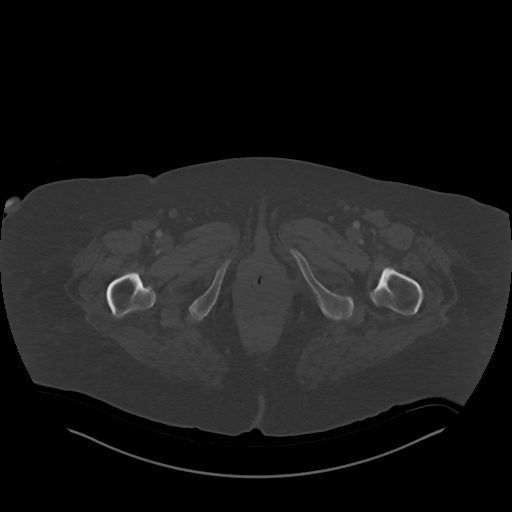
[im 15/96  soft-tissue]
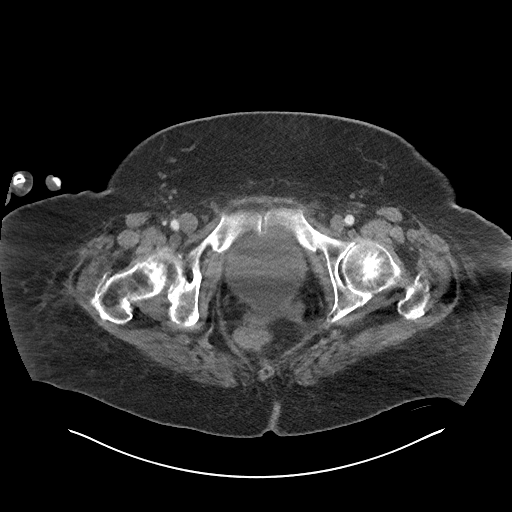
[im 20/96  soft-tissue]
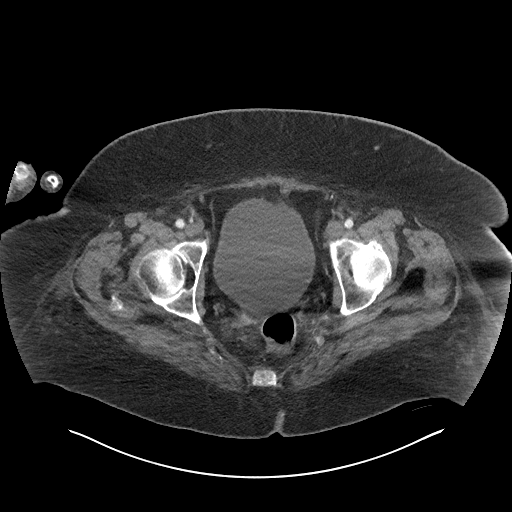
[im 29/96  soft-tissue]
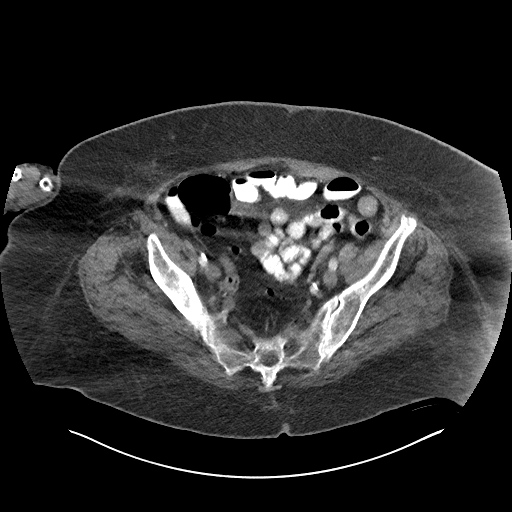
[im 34/96  soft-tissue]
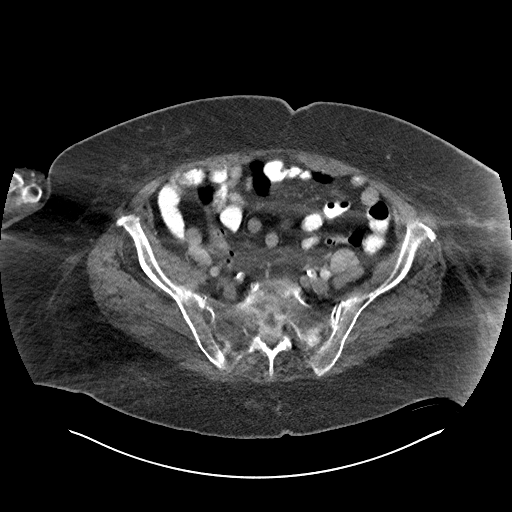
[im 43/96  soft-tissue]
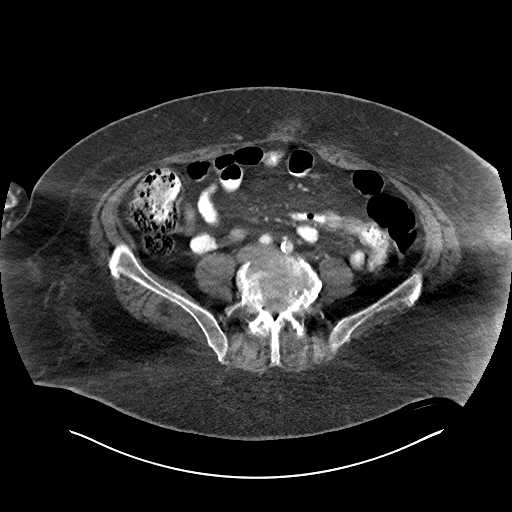
[im 48/96  soft-tissue]
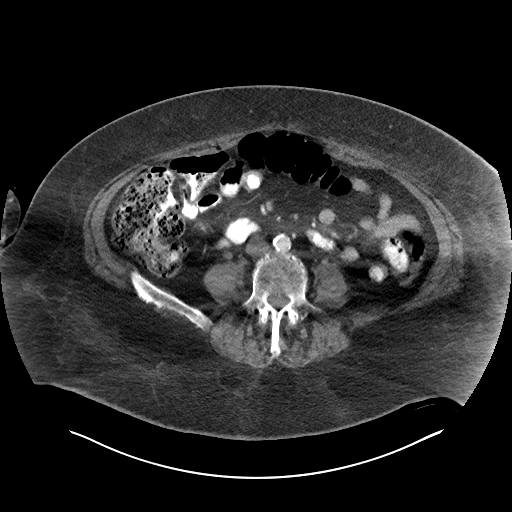
[im 53/96  soft-tissue]
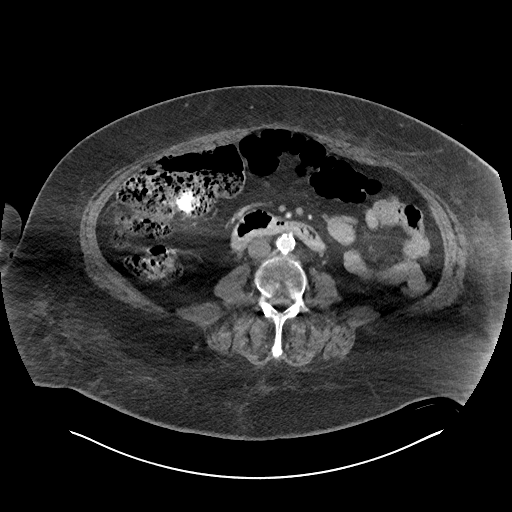
[im 62/96  soft-tissue]
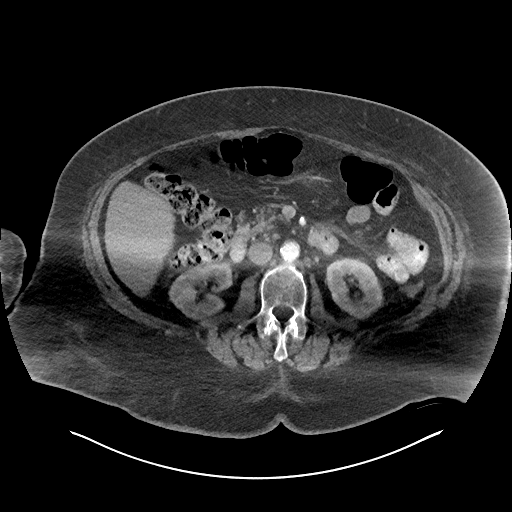
[im 62/96  bone]
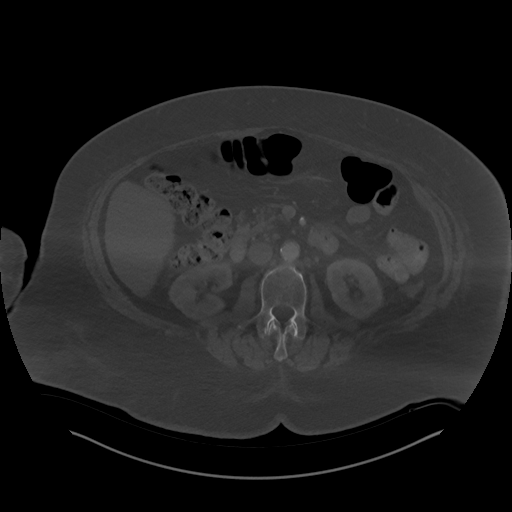
[im 67/96  soft-tissue]
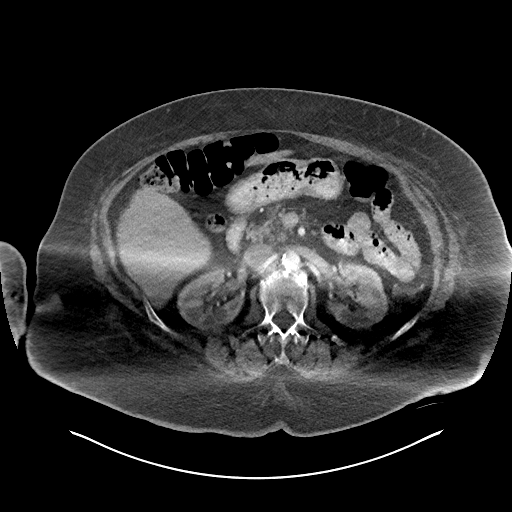
[im 77/96  soft-tissue]
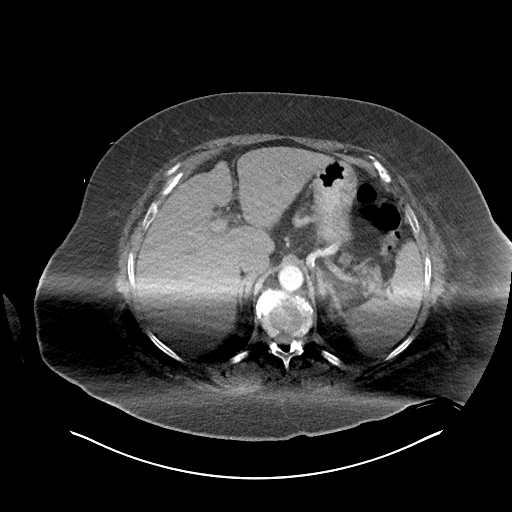
[im 81/96  soft-tissue]
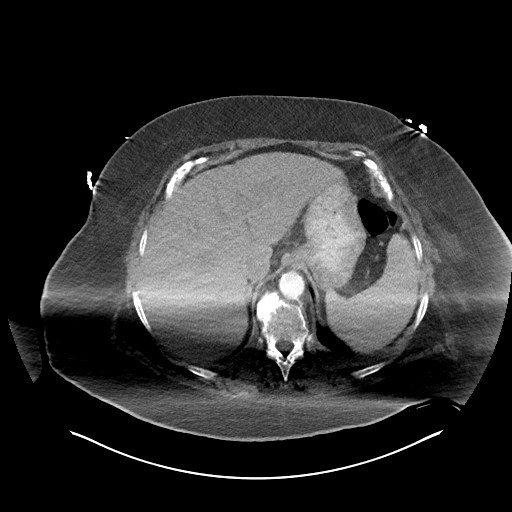
[im 91/96  soft-tissue]
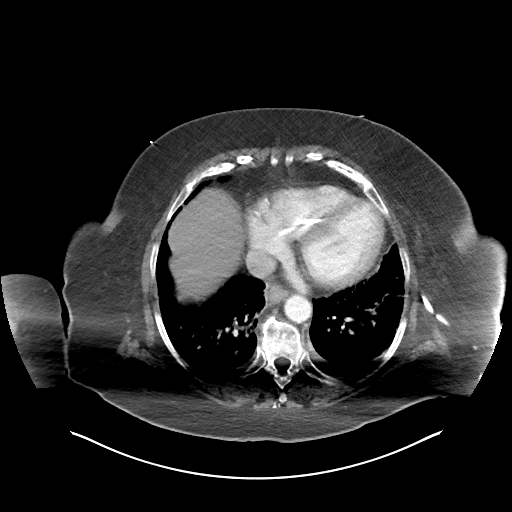

[Series 5: coronal soft tissue · coronal · 0.97mm/px · 3 of 110 slices shown]
[im 37/110  soft-tissue]
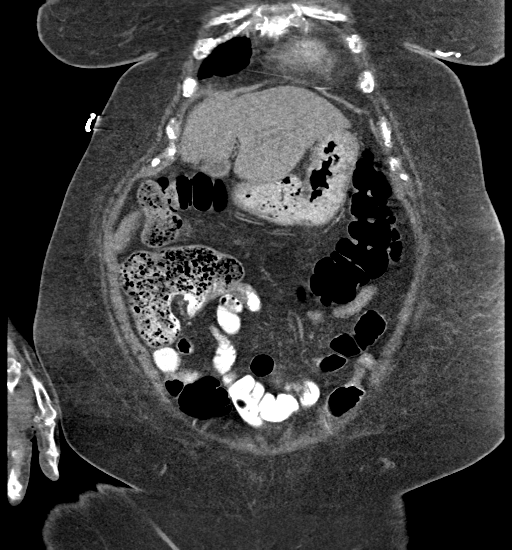
[im 49/110  soft-tissue]
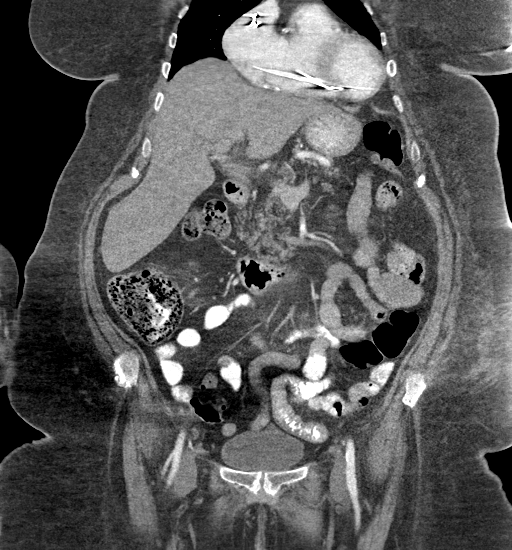
[im 61/110  soft-tissue]
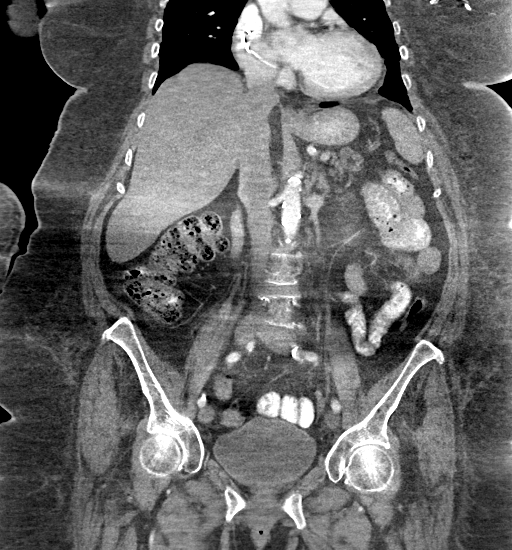

[16 of 46 positions shown; findings below may reference images not displayed]

FINDINGS: Lower chest: Small bilateral pleural effusions with basilar
atelectasis.

Hepatobiliary: No focal liver abnormality is seen. No gallstones,
gallbladder wall thickening, or biliary dilatation.

Pancreas: Diffuse fatty atrophy of the pancreas. No inflammatory
change or focal mass identified.

Spleen: Normal in size without focal abnormality.

Adrenals/Urinary Tract: Adrenal glands are unremarkable. Kidneys are
normal, without renal calculi, focal lesion, or hydronephrosis.
Bladder is unremarkable.

Stomach/Bowel: The stomach, small bowel, and colon are not
abnormally distended. No wall thickening or inflammatory changes are
identified. Diverticulosis of the sigmoid colon. No evidence of
diverticulitis. Appendix is not identified.

Vascular/Lymphatic: Aortic atherosclerosis. No enlarged abdominal or
pelvic lymph nodes.

Reproductive: Status post hysterectomy. No adnexal masses.

Other: No free air or free fluid in the abdomen. Abdominal wall
musculature appears intact.

Musculoskeletal: Degenerative changes in the spine. No destructive
bone lesions.
IMPRESSION: 1. No acute process demonstrated in the abdomen or pelvis. No
evidence of bowel obstruction or inflammation.
2. Small bilateral pleural effusions with basilar atelectasis.
3. Diffuse fatty atrophy of the pancreas.
4. Aortic atherosclerosis.

Aortic Atherosclerosis ([id number]).

## 2021-07-28 ENCOUNTER — Ambulatory Visit
Admission: RE | Admit: 2021-07-28 | Discharge: 2021-07-28 | Disposition: A | Discharge status: Home / Self Care | Payer: Medicare Other | Source: Ambulatory Visit | Attending: Internal Medicine | Admitting: Internal Medicine

## 2021-07-28 ENCOUNTER — Other Ambulatory Visit: Payer: Self-pay

## 2021-07-28 DIAGNOSIS — Z13820 Encounter for screening for osteoporosis: Secondary | ICD-10-CM

## 2021-07-28 DIAGNOSIS — Z1231 Encounter for screening mammogram for malignant neoplasm of breast: Secondary | ICD-10-CM

## 2021-09-07 ENCOUNTER — Telehealth: Payer: Self-pay | Admitting: Neurology

## 2021-09-07 NOTE — Telephone Encounter (Signed)
Ross Ludwig, RN Thanks!      Previous Messages   ----- Message -----  From: Guy Begin, RN  Sent: 09/07/2021   4:17 PM EDT  To: Kirsten Jensen   It did but it also had about supplies.  If you need something different let me know.  Kirsten Jensen  ----- Message -----  From: Kirsten Jensen  Sent: 09/07/2021   3:56 PM EDT  To: Guy Begin, RN   okay!   I thought that one said a new machine. I will double check    ----- Message -----  From: Guy Begin, RN  Sent: 09/07/2021   3:14 PM EDT  To: Kirsten Jensen   There is an order for cpap supplies from 04-30-21.  SY  ----- Message -----  From: Kirsten Jensen  Sent: 09/07/2021   3:07 PM EDT  To: Guy Begin, RN   Can you please put in an order for supplies?   ----- Message -----  From: Guy Begin, RN  Sent: 09/07/2021   2:39 PM EDT  To: Kirsten Jensen, Kirsten Jensen   Good afternoon,  can you help me with this pt?  She  had gotten and has used her new phillips (resp[employer]ics) from recall (this was when she lived in [place]).  but her mask is now broken.   She is trying to get a new one.  I know we did an order for a autopap back in 04/2021, but she is using this phillips machine.  Where can she get a new mask?  Ithink she spoke to someone there but does not recall the vender/     Kirsten Jensen  Female, 87 y.o., Jul 11, 1932  MRN:  [id number]  Phone:  [phone]

## 2021-09-07 NOTE — Telephone Encounter (Signed)
I called pt she had gotten and has used her new phillips ([employer]ironics) but her mask is now broken.  She needs a new one.  She had the phillips machine purchased in [place]  ([place] not know the [place]  She is trying to get a replacement and [facility] states that she will have to get from another vender.  She does not recall name.  I relayed that I will CM [facility] and ask what vender she will be able table able to use. I CM to [facility] and will call pt back once I get response.  Pt verbalized understanding.

## 2021-09-07 NOTE — Telephone Encounter (Signed)
Pt is asking for a call re: her CPAP being on recall.  Pt states she has loss a lot of memory as a result of being without her CPAP.  Pt states [facility] has told her they are not handling the [employer] mask, please call pt.

## 2021-09-08 NOTE — Telephone Encounter (Signed)
Called pt and LVM asking for call back. When she calls back, please let her know that [facility] has responded. They should take care of the order for CPAP supplies.

## 2021-09-08 NOTE — Telephone Encounter (Signed)
Pt returned phone call, informed her of telephone note as instructed. Pt verbalized understand, gave pt [facility]'s phone number.

## 2021-09-08 NOTE — Telephone Encounter (Signed)
[facility] did receive th order for supplies.

## 2021-09-08 NOTE — Telephone Encounter (Signed)
Wonderful, thank you

## 2021-09-10 ENCOUNTER — Telehealth: Payer: Self-pay | Admitting: Internal Medicine

## 2021-09-10 NOTE — Telephone Encounter (Signed)
Spoke with pt who reports she recently traveled to [place] from 09/16/-09/21.  She reports weight increase of 6 pounds.  Denies increased SOB, CP or dizziness or fatigue.  She states she feels as if she has fluid in her left knee and thigh and her right arm.  She has been elevating her extremities but has not been limiting her Na+ intake and drinks 32-64 ounces of fluid daily.  She is sleeping fine without any increase in pillows.  She is not currently on a scheduled diuretic. Encouraged pt to follow low Na+ diet and monitor for additional symptoms.  Will forward information to Dr Graciela Husbands for review and further recommendation.

## 2021-09-10 NOTE — Telephone Encounter (Signed)
Pt c/o swelling: STAT is pt has developed SOB within 24 hours  How much weight have you gained and in what time span? 12lbs  If swelling, where is the swelling located? In her legs more so right  Are you currently taking a fluid pill? yes  Are you currently SOB? No more then usually  Do you have a log of your daily weights (if so, list)? no  Have you gained 3 pounds in a day or 5 pounds in a week? yes  Have you traveled recently? yes

## 2021-09-15 NOTE — Telephone Encounter (Addendum)
Spoke with pt and advised per Dr Graciela Husbands continue to monitor weight and she may have facility provider follow.  Pt states her weight is down 4 pounds now so she is headed in the right direction.  She states again she is not on a diuretic.  Pt verbalized understanding and thanked Charity fundraiser for the call.

## 2021-10-13 ENCOUNTER — Ambulatory Visit: Payer: BLUE CROSS/BLUE SHIELD | Admitting: Family

## 2021-10-16 ENCOUNTER — Telehealth: Payer: Self-pay

## 2021-10-16 NOTE — Telephone Encounter (Signed)
23 alert/transmissions received 11/4.  Majority are for an increase in RV pacing. Presenting is flutter ongoing from 11/1 @ 20:39, overall CVR Known PAF, burden 84%, Eliquis, Diltiazem, and Metoprolol prescribed AF persistent since early June.  Last RVR in Sept Variable RV amplitude. Route for increased burden and multiple transmissions recived 11/4 LR  Successful telephone call to patient patient to discuss multiple manual transmission received today. Patient lives in assisted living and is legally blind. Patient received monitor adaptor that arrived today and was being placed (with difficulty) by nursing staff which explains high number of transmissions. Patient states she is doing well. AT/AF burden remains constant. Compliant with medications as they are administered to her. Thankful for follow up call.

## 2021-10-28 ENCOUNTER — Ambulatory Visit (INDEPENDENT_AMBULATORY_CARE_PROVIDER_SITE_OTHER): Payer: Medicare Other

## 2021-10-28 DIAGNOSIS — I495 Sick sinus syndrome: Secondary | ICD-10-CM | POA: Diagnosis not present

## 2021-10-28 LAB — CUP PACEART REMOTE DEVICE CHECK
Battery Remaining Longevity: 48 mo
Battery Remaining Percentage: 74 %
Brady Statistic RA Percent Paced: 2 %
Brady Statistic RV Percent Paced: 27 %
Date Time Interrogation Session: 20221116011100
Implantable Lead Implant Date: 20191003
Implantable Lead Implant Date: 20191003
Implantable Lead Location: 753859
Implantable Lead Location: 753860
Implantable Lead Model: 7741
Implantable Lead Model: 7742
Implantable Lead Serial Number: 1047491
Implantable Lead Serial Number: 1074664
Implantable Pulse Generator Implant Date: 20191003
Lead Channel Impedance Value: 708 Ohm
Lead Channel Impedance Value: 764 Ohm
Lead Channel Pacing Threshold Amplitude: 1.3 V
Lead Channel Pacing Threshold Pulse Width: 0.4 ms
Lead Channel Setting Pacing Amplitude: 2 V
Lead Channel Setting Pacing Amplitude: 2.5 V
Lead Channel Setting Pacing Pulse Width: 0.4 ms
Lead Channel Setting Sensing Sensitivity: 1 mV
Pulse Gen Serial Number: 444544

## 2021-11-04 NOTE — Progress Notes (Signed)
Remote pacemaker transmission.   

## 2022-01-27 ENCOUNTER — Ambulatory Visit (INDEPENDENT_AMBULATORY_CARE_PROVIDER_SITE_OTHER): Payer: Medicare Other

## 2022-01-27 DIAGNOSIS — I495 Sick sinus syndrome: Secondary | ICD-10-CM | POA: Diagnosis not present

## 2022-01-29 LAB — CUP PACEART REMOTE DEVICE CHECK
Battery Remaining Longevity: 42 mo
Battery Remaining Percentage: 71 %
Brady Statistic RA Percent Paced: 1 %
Brady Statistic RV Percent Paced: 38 %
Date Time Interrogation Session: 20230217123600
Implantable Lead Implant Date: 20191003
Implantable Lead Implant Date: 20191003
Implantable Lead Location: 753859
Implantable Lead Location: 753860
Implantable Lead Model: 7741
Implantable Lead Model: 7742
Implantable Lead Serial Number: 1047491
Implantable Lead Serial Number: 1074664
Implantable Pulse Generator Implant Date: 20191003
Lead Channel Impedance Value: 716 Ohm
Lead Channel Impedance Value: 754 Ohm
Lead Channel Pacing Threshold Amplitude: 1.4 V
Lead Channel Pacing Threshold Pulse Width: 0.4 ms
Lead Channel Setting Pacing Amplitude: 2 V
Lead Channel Setting Pacing Amplitude: 2.5 V
Lead Channel Setting Pacing Pulse Width: 0.4 ms
Lead Channel Setting Sensing Sensitivity: 1 mV
Pulse Gen Serial Number: 444544

## 2022-02-01 NOTE — Progress Notes (Signed)
Remote pacemaker transmission.   

## 2022-04-28 ENCOUNTER — Ambulatory Visit (INDEPENDENT_AMBULATORY_CARE_PROVIDER_SITE_OTHER): Payer: Medicare Other

## 2022-04-28 DIAGNOSIS — I495 Sick sinus syndrome: Secondary | ICD-10-CM | POA: Diagnosis not present

## 2022-04-30 LAB — CUP PACEART REMOTE DEVICE CHECK
Battery Remaining Longevity: 36 mo
Battery Remaining Percentage: 62 %
Brady Statistic RA Percent Paced: 1 %
Brady Statistic RV Percent Paced: 45 %
Date Time Interrogation Session: 20230518110600
Implantable Lead Implant Date: 20191003
Implantable Lead Implant Date: 20191003
Implantable Lead Location: 753859
Implantable Lead Location: 753860
Implantable Lead Model: 7741
Implantable Lead Model: 7742
Implantable Lead Serial Number: 1047491
Implantable Lead Serial Number: 1074664
Implantable Pulse Generator Implant Date: 20191003
Lead Channel Impedance Value: 716 Ohm
Lead Channel Impedance Value: 734 Ohm
Lead Channel Pacing Threshold Amplitude: 0.7 V
Lead Channel Pacing Threshold Amplitude: 1.2 V
Lead Channel Pacing Threshold Pulse Width: 0.4 ms
Lead Channel Pacing Threshold Pulse Width: 0.4 ms
Lead Channel Setting Pacing Amplitude: 2 V
Lead Channel Setting Pacing Amplitude: 2.5 V
Lead Channel Setting Pacing Pulse Width: 0.4 ms
Lead Channel Setting Sensing Sensitivity: 1 mV
Pulse Gen Serial Number: 444544

## 2022-05-04 ENCOUNTER — Telehealth: Payer: Self-pay | Admitting: Internal Medicine

## 2022-05-04 NOTE — Telephone Encounter (Signed)
Patient is calling to talk with [name] or nurse. Please call back to discuss

## 2022-05-04 NOTE — Telephone Encounter (Signed)
Spoke with pt who states she is due/past due for follow up appointment with Dr Caryl Comes.  Pt is willing to see Tommye Standard, PA-C.  Pt advised will forward to Dr Olin Pia scheduler to call and schedule her for appointment.  She would also like to be placed on wait list for Dr Caryl Comes.

## 2022-05-05 ENCOUNTER — Ambulatory Visit (INDEPENDENT_AMBULATORY_CARE_PROVIDER_SITE_OTHER): Payer: Medicare Other | Admitting: Internal Medicine

## 2022-05-05 ENCOUNTER — Encounter: Payer: Self-pay | Admitting: Internal Medicine

## 2022-05-05 VITALS — BP 124/62 | HR 54 | Ht 67.0 in | Wt 260.0 lb

## 2022-05-05 DIAGNOSIS — I482 Chronic atrial fibrillation, unspecified: Secondary | ICD-10-CM | POA: Diagnosis not present

## 2022-05-05 DIAGNOSIS — I484 Atypical atrial flutter: Secondary | ICD-10-CM | POA: Diagnosis not present

## 2022-05-05 DIAGNOSIS — Z950 Presence of cardiac pacemaker: Secondary | ICD-10-CM

## 2022-05-05 DIAGNOSIS — I5032 Chronic diastolic (congestive) heart failure: Secondary | ICD-10-CM | POA: Diagnosis not present

## 2022-05-05 MED ORDER — METOPROLOL SUCCINATE ER 50 MG PO TB24: 50.0000 mg | ORAL_TABLET | Freq: Every day | ORAL | 3 refills | Status: DC

## 2022-05-05 NOTE — Progress Notes (Signed)
Oh back to that lady is you right this is a 300 pound lady is that her hemoglobin is 9 point     Patient Care Team: Michael Boston, MD as PCP - General (Internal Medicine) Deboraha Sprang, MD as PCP - Cardiology (Cardiology) Deboraha Sprang, MD as PCP - Electrophysiology (Cardiology)   HPI  Kirsten Jensen is a 88 y.o. female seen in follow-up for a Dual chamber [employer] pacemaker implanted 2019 for what was suspected tachybradycardia syndrome and atrial fibrillation; anticoagulated with Eliquis  History of coronary artery disease.  She was stented about 15 years ago and again in 2018; on clopidogrel Hx upper GI bleed. GD demonstrated an ulcer and candidiasis.  Colonoscopy demonstrated hemorrhoids and diverticulosis with polyps.   During the hospitalization she was noted to be in atrial flutter.  Reviewing electrocardiograms back to 2020-November, sinus rhythm was last appreciated 12/20 and every subsequent ECG has demonstrated atrial flutter  The patient denies chest pain, nocturnal dyspnea, orthopnea .  There have been no palpitations, lightheadedness or syncope.  Complains of mild edema and some shortness of breath.  Relatively stable  She shares a litany of events at [facility]  DATE TEST EF   4/21 Echo   50 %   11/21 Echo   55-60 %     Date Cr K Hgb  2/21 1.10 4.8 10.9 (10/20)  11/21  0.87 4.0 8.8 >> 9.1  4/22 1.27 4.4 8.4   Thromboembolic risk factors ( age  -2, HTN-1, DM-1, Vasc disease -1, Gender-1) for a CHADSVASc Score of >=6   Past Medical History:  Diagnosis Date   AF (paroxysmal atrial fibrillation) ([facility]) 03/25/2021   Atrial fibrillation ([place])    Coronary artery disease    stenting x 2   Diabetes ([place])    Dyslipidemia 04/05/2020   Hypertension    Morbid obesity ([place])    Pacemaker    Paroxysmal atrial fibrillation ([place]) 11/06/2019   Secondary hypercoagulable state ([place]) 11/06/2019   T2DM (type 2 diabetes mellitus) ([place]) 04/05/2020    Tachycardia-bradycardia syndrome ([place]) 03/17/2020    Past Surgical History:  Procedure Laterality Date   COLONOSCOPY N/A 11/02/2020   Procedure: COLONOSCOPY;  Surgeon: Clarene Essex, MD;  Location: [facility];  Service: Endoscopy;  Laterality: N/A;   ESOPHAGOGASTRODUODENOSCOPY N/A 11/01/2020   Procedure: ESOPHAGOGASTRODUODENOSCOPY (EGD);  Surgeon: Clarene Essex, MD;  Location: [facility];  Service: Endoscopy;  Laterality: N/A;    Current Meds  Medication Sig   acetaminophen (TYLENOL) 500 MG tablet Take 500 mg by mouth 2 (two) times daily as needed for mild pain.    acetaminophen-codeine (TYLENOL #3) 300-30 MG tablet Take 1 tablet by mouth 3 (three) times daily as needed for moderate pain.   apixaban (ELIQUIS) 5 MG TABS tablet Take 1 tablet (5 mg total) by mouth 2 (two) times daily.   ascorbic acid (VITAMIN C) 500 MG tablet Take 1 tablet (500 mg total) by mouth daily.   buPROPion (WELLBUTRIN) 100 MG tablet Take 100 mg by mouth daily.   cholecalciferol (VITAMIN D3) 25 MCG (1000 UNIT) tablet Take 1,000 Units by mouth daily.   diltiazem (CARDIZEM LA) 120 MG 24 hr tablet Take 120 mg by mouth daily.   JARDIANCE 10 MG TABS tablet Take 10 mg by mouth daily.   ketoconazole (NIZORAL) 2 % shampoo Apply 1 application topically 3 (three) times a week. Let sit for 3 minutes before rinsing.   Tuesday Thursday Saturday   levothyroxine (SYNTHROID) 50 MCG  tablet Take 50 mcg by mouth daily before breakfast.   magnesium oxide (MAG-OX) 400 MG tablet Take 1 tablet (400 mg total) by mouth daily.   metFORMIN (GLUCOPHAGE-XR) 500 MG 24 hr tablet Take 500 mg by mouth daily.   Multiple Vitamins-Minerals (OCUVITE ADULT 50+ PO) Take 1 capsule by mouth daily.   pantoprazole (PROTONIX) 40 MG tablet Take 1 tablet (40 mg total) by mouth 2 (two) times daily before a meal.   pravastatin (PRAVACHOL) 20 MG tablet Take 1 tablet (20 mg total) by mouth daily at 6 PM.   vitamin B-12 (CYANOCOBALAMIN) 1000 MCG tablet Take 1,000 mcg  by mouth daily.    No Known Allergies  Review of Systems negative except from HPI and PMH  Physical Exam BP 124/62   Pulse (!) 54   Ht 5\' 7"  (1.702 m)   Wt 260 lb (117.9 kg)   LMP  (LMP Unknown)   SpO2 94%   BMI 40.72 kg/m  Well developed Morbidly obese  in no acute distress HENT normal Neck supple with JVP-flat Clear Device pocket well healed; without hematoma or erythema.  There is no tethering  Regular rate and rhythm, no  gallop No  murmur Abd-soft with active BS No Clubbing cyanosis tr edema Skin-warm and dry A & Oriented  Grossly normal sensory and motor function  ECG atrial pacing at 54 Intervals 22/13/44 Right bundle branch block  Assessment and  Plan Atrial fibrillation-persistent/flutter  Bradycardia-presumed tachybradycardia  Pacemaker-[employer]  HFpEF -chronic-diastolic  Coronary artery disease-prior stenting x2  Morbidly obese  Ventricular undersensing bipolar @ 2.5 mV  Mostly chronic atrial fibrillation/flutter.  Rates are somewhat rapid.  Some of this is because of the flutter.  We will try and slow down by increase her metoprolol from 25--50 mg daily.  On apixaban for thromboembolic risk reduction, we will continue.  No clinical bleeding.  She does take iron.  Mildly volume overloaded.  With history of dehydration not inclined towards being aggressive here.  Would also wonder whether she would be a candidate for an SGLT2 will defer that to her primary care physician .

## 2022-05-05 NOTE — Patient Instructions (Addendum)
Medication Instructions:  Your physician has recommended you make the following change in your medication:   ** Stop Metoprolol Succinate 25mg  and begin Metoprolol Succinate 50mg  - 1 tablet by mouth daily  *If you need a refill on your cardiac medications before your next appointment, please call your pharmacy*   Lab Work: None ordered.  If you have labs (blood work) drawn today and your tests are completely normal, you will receive your results only by: [facility] (if you have MyChart) OR A paper copy in the mail If you have any lab test that is abnormal or we need to change your treatment, we will call you to review the results.   Testing/Procedures: None ordered.    Follow-Up: At [facility], you and your health needs are our priority.  As part of our continuing mission to provide you with exceptional heart care, we have created designated Provider Care Teams.  These Care Teams include your primary Cardiologist (physician) and Advanced Practice Providers (APPs -  Physician Assistants and Nurse Practitioners) who all work together to provide you with the care you need, when you need it.  We recommend signing up for the patient portal called "MyChart".  Sign up information is provided on this After Visit Summary.  MyChart is used to connect with patients for Virtual Visits (Telemedicine).  Patients are able to view lab/test results, encounter notes, upcoming appointments, etc.  Non-urgent messages can be sent to your provider as well.   To learn more about what you can do with MyChart, go to [email or web address].    Your next appointment:   12 month(s)  The format for your next appointment:   In Person  Provider:   You will see one of the following Advanced Practice Providers on your designated Care Team:   Tommye Standard, [place] Legrand Como "Jonni Sanger" Chalmers Cater, [place]     Important Information About Sugar

## 2022-05-11 NOTE — Progress Notes (Signed)
Remote pacemaker transmission.   

## 2022-05-17 ENCOUNTER — Other Ambulatory Visit: Payer: Self-pay | Admitting: Internal Medicine

## 2022-05-19 NOTE — Telephone Encounter (Signed)
Entered in error

## 2022-05-31 ENCOUNTER — Telehealth: Payer: Self-pay | Admitting: Internal Medicine

## 2022-05-31 NOTE — Telephone Encounter (Signed)
Patient is requesting to speak with clinical staff ASAP. She states she been nauseous 3-4 mornings in a row. She states one of the mornings she threw up her medications. Hemoglobin has been low and and has been very tired and low on energy. She states yesterday she slept all day and her joints have also been aching. O2 is at 90.

## 2022-05-31 NOTE — Telephone Encounter (Signed)
Spoke with the patient who states that about 4 days ago she began not feeling well. She woke up very nauseous, weak, no energy, and achy. She states that she did vomit one morning. She is feeling a bit better today and her appetite has returned. She states that she had labs done at her PCP about a month ago and her hemoglobin was a little bit low. She has not followed up with her PCP. She states that she has not taken her temperature but does not feel as though she is running a fever. She does not have any HR or BP readings but reports O2 this morning was 90%. She states that she is staying hydrated. Reports only medication change that has been made recently was her metoprolol. Advised patient that I would make [name] aware and that she should also reach out to her PCP.

## 2022-06-02 ENCOUNTER — Telehealth: Payer: Self-pay | Admitting: Internal Medicine

## 2022-06-02 MED ORDER — METOPROLOL SUCCINATE ER 25 MG PO TB24: 25.0000 mg | ORAL_TABLET | Freq: Every day | ORAL | 3 refills | Status: DC

## 2022-06-02 NOTE — Telephone Encounter (Signed)
Spoke with the patient who reports still feeling fatigued. Her nausea has improved and she has not thrown up anymore. She states that she is concerned that she is in the early stages of needing a blood transfusion as she feels similar to how she did prior to hospital admission last year. She states that she does not feel quite as bad but feels like it could be getting to that point. She was advised on Monday to call her PCP. She has since spoken to them and they are supposed to be calling her back this morning with an appointment time. Advised patient that I would keep [name] up to date and advised that if she starts to feel worse before she can be seen by PCP she can always report to the ER. Patient verbalized understanding.

## 2022-06-02 NOTE — Telephone Encounter (Signed)
Spoke with the patient and advised her to cut back metoprolol to 25 mg daily. She will need this order faxed to [facility].  Patient did see her PCP this afternoon and they did blood work and a urinalysis.

## 2022-06-02 NOTE — Telephone Encounter (Signed)
Pt c/o medication issue:  1. Name of Medication:  metoprolol succinate (TOPROL-XL) 50 MG 24 hr tablet  2. How are you currently taking this medication (dosage and times per day)? As prescribed  3. Are you having a reaction (difficulty breathing--STAT)?  No  4. What is your medication issue?    Patient called stating she was getting tired easily.  She stated her oxygen dropped to 90 and her hemoglobin was lower than normal.

## 2022-06-02 NOTE — Telephone Encounter (Signed)
Are you new and diferent now?:)))  Agree she needs to followup with her PCP  her last Hgb was 8.4 and the metoprolol we should decrease to her prior (25 mg daose) as this stuff gets sorted out  Thanks SK

## 2022-06-03 NOTE — Telephone Encounter (Signed)
Pt c/o medication issue:  1. Name of Medication:    metoprolol succinate (TOPROL XL) 25 MG 24 hr tablet  2. How are you currently taking this medication (dosage and times per day)?  Patient stated she is taking 50mg , 1x day  3. Are you having a reaction (difficulty breathing--STAT)? No  4. What is your medication issue?    Patient stated she needs clarification on taking this medication.  She stated she had been taking 50mg  and believes she should be taking 25mg .  Patient wanted new orders faxed to Assisted Living facility.

## 2022-06-03 NOTE — Telephone Encounter (Signed)
Spoke with the patient and advised that she is supposed to be taking metoprolol 25 mg daily. She states that the med tech would not give this to her this morning. Advised that I would call [facility] and refax orders if needed. Spoke with the receptionist at [employer] and she took a message to have the med tech call me back.

## 2022-06-24 ENCOUNTER — Telehealth: Payer: Self-pay | Admitting: Internal Medicine

## 2022-06-24 NOTE — Telephone Encounter (Signed)
Pt c/o medication issue:  1. Name of Medication: All  2. How are you currently taking this medication (dosage and times per day)?  As prescribed  3. Are you having a reaction (difficulty breathing--STAT)?   No  4. What is your medication issue?    Patient called stating she did not take her morning medications and she is concerned about her next steps.    Patient also stated she has some discomfort in her joints, shoulder and hands.  BP 132/58  HR 58

## 2022-06-29 NOTE — Telephone Encounter (Signed)
Spoke with pt who states the Assisted Living Facility was able to resolve her medication issue as she is now under the care of DSS.  Pt states her joint pains have also resolved.  Pt thanked Charity fundraiser for the phone call

## 2022-07-02 ENCOUNTER — Telehealth: Payer: Self-pay | Admitting: Internal Medicine

## 2022-07-02 NOTE — Telephone Encounter (Signed)
Spoke with pt and will discuss pt's concerns with Dr Graciela Husbands when he returns to the office.

## 2022-07-02 NOTE — Telephone Encounter (Signed)
Pt is requesting to speak to Rodman Key, RN who she spoke to previously. She also states that this is a personal call to [name] and that this may be something that could possibly "endanger him legally".

## 2022-07-02 NOTE — Telephone Encounter (Signed)
error 

## 2022-07-15 ENCOUNTER — Ambulatory Visit (INDEPENDENT_AMBULATORY_CARE_PROVIDER_SITE_OTHER): Payer: Medicare Other | Admitting: Ophthalmology

## 2022-07-15 ENCOUNTER — Encounter (INDEPENDENT_AMBULATORY_CARE_PROVIDER_SITE_OTHER): Payer: Self-pay | Admitting: Ophthalmology

## 2022-07-15 DIAGNOSIS — H353233 Exudative age-related macular degeneration, bilateral, with inactive scar: Secondary | ICD-10-CM | POA: Diagnosis not present

## 2022-07-15 DIAGNOSIS — H353134 Nonexudative age-related macular degeneration, bilateral, advanced atrophic with subfoveal involvement: Secondary | ICD-10-CM

## 2022-07-15 DIAGNOSIS — E119 Type 2 diabetes mellitus without complications: Secondary | ICD-10-CM | POA: Diagnosis not present

## 2022-07-15 NOTE — Assessment & Plan Note (Signed)
No sign of diabetic retinopathy.

## 2022-07-15 NOTE — Assessment & Plan Note (Signed)
No signs of active CNVM OS 

## 2022-07-15 NOTE — Assessment & Plan Note (Signed)
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  I explained to the patient that not likely that she will have measurable decline in further vision and that her mid peripheral vision will remain intact likely for a lifetime

## 2022-07-15 NOTE — Progress Notes (Signed)
07/15/2022     CHIEF COMPLAINT Patient presents for  Chief Complaint  Patient presents with   Macular Degeneration      HISTORY OF PRESENT ILLNESS: Kirsten Jensen is a 88 y.o. female who presents to the clinic today for:   HPI   1 yr 26mos FU OU FUNDUS PHOTOS. Pt stated she experienced a decrease in vision. Pt reports, "I live on the second floor in my assisted living facility and I used to see people downstairs and I would be able to make them out. There was one gentleman with long legs and now everyone looks like they have long legs."  Pt noticed changes in vision about a year ago.     Last edited by Angeline Slim on 07/15/2022  1:19 PM.      Referring physician: Melida Quitter, MD [address] [place],  [place] [address]  HISTORICAL INFORMATION:   Selected notes from the MEDICAL RECORD NUMBER    Lab Results  Component Value Date   HGBA1C 6.9 (H) 02/17/2021     CURRENT MEDICATIONS: No current outpatient medications on file. (Ophthalmic Drugs)   No current facility-administered medications for this visit. (Ophthalmic Drugs)   Current Outpatient Medications (Other)  Medication Sig   acetaminophen (TYLENOL) 500 MG tablet Take 500 mg by mouth 2 (two) times daily as needed for mild pain.    acetaminophen-codeine (TYLENOL #3) 300-30 MG tablet Take 1 tablet by mouth 3 (three) times daily as needed for moderate pain.   apixaban (ELIQUIS) 5 MG TABS tablet Take 1 tablet (5 mg total) by mouth 2 (two) times daily.   ascorbic acid (VITAMIN C) 500 MG tablet Take 1 tablet (500 mg total) by mouth daily.   buPROPion (WELLBUTRIN) 100 MG tablet Take 100 mg by mouth daily.   cholecalciferol (VITAMIN D3) 25 MCG (1000 UNIT) tablet Take 1,000 Units by mouth daily.   diltiazem (CARDIZEM LA) 120 MG 24 hr tablet Take 120 mg by mouth daily.   ferrous sulfate 325 (65 FE) MG tablet Take 1 tablet (325 mg total) by mouth daily with breakfast.   glimepiride (AMARYL) 1 MG tablet Take 1 tablet (1 mg  total) by mouth daily with supper.   glimepiride (AMARYL) 2 MG tablet Take 2 mg by mouth daily.   JARDIANCE 10 MG TABS tablet Take 10 mg by mouth daily.   ketoconazole (NIZORAL) 2 % shampoo Apply 1 application topically 3 (three) times a week. Let sit for 3 minutes before rinsing.   Tuesday Thursday Saturday   levothyroxine (SYNTHROID) 50 MCG tablet Take 50 mcg by mouth daily before breakfast.   magnesium oxide (MAG-OX) 400 MG tablet Take 1 tablet (400 mg total) by mouth daily.   metFORMIN (GLUCOPHAGE-XR) 500 MG 24 hr tablet Take 500 mg by mouth daily.   metoprolol succinate (TOPROL XL) 25 MG 24 hr tablet Take 1 tablet (25 mg total) by mouth daily.   Multiple Vitamins-Minerals (OCUVITE ADULT 50+ PO) Take 1 capsule by mouth daily.   pantoprazole (PROTONIX) 40 MG tablet Take 1 tablet (40 mg total) by mouth 2 (two) times daily before a meal.   pravastatin (PRAVACHOL) 20 MG tablet Take 1 tablet (20 mg total) by mouth daily at 6 PM.   vitamin B-12 (CYANOCOBALAMIN) 1000 MCG tablet Take 1,000 mcg by mouth daily.   No current facility-administered medications for this visit. (Other)      REVIEW OF SYSTEMS: ROS   Negative for: Constitutional, Gastrointestinal, Neurological, Skin, Genitourinary, Musculoskeletal,  HENT, Endocrine, Cardiovascular, Eyes, Respiratory, Psychiatric, Allergic/Imm, Heme/Lymph Last edited by Angeline Slim on 07/15/2022  1:13 PM.       ALLERGIES No Known Allergies  PAST MEDICAL HISTORY Past Medical History:  Diagnosis Date   AF (paroxysmal atrial fibrillation) (HCC) 03/25/2021   Atrial fibrillation (HCC)    Coronary artery disease    stenting x 2   Diabetes (HCC)    Dyslipidemia 04/05/2020   Hypertension    Morbid obesity (HCC)    Pacemaker    Paroxysmal atrial fibrillation (HCC) 11/06/2019   Secondary hypercoagulable state (HCC) 11/06/2019   T2DM (type 2 diabetes mellitus) (HCC) 04/05/2020   Tachycardia-bradycardia syndrome (HCC) 03/17/2020   Past Surgical History:   Procedure Laterality Date   COLONOSCOPY N/A 11/02/2020   Procedure: COLONOSCOPY;  Surgeon: Vida Rigger, MD;  Location: [facility] ENDOSCOPY;  Service: Endoscopy;  Laterality: N/A;   ESOPHAGOGASTRODUODENOSCOPY N/A 11/01/2020   Procedure: ESOPHAGOGASTRODUODENOSCOPY (EGD);  Surgeon: Vida Rigger, MD;  Location: [facility] ENDOSCOPY;  Service: Endoscopy;  Laterality: N/A;    FAMILY HISTORY Family History  Problem Relation Age of Onset   Hypertension Mother    Stroke Mother        died at age 13   CAD Father        MI at age of 33   Breast cancer Daughter     SOCIAL HISTORY Social History   Tobacco Use   Smoking status: Never   Smokeless tobacco: Never  Vaping Use   Vaping Use: Never used  Substance Use Topics   Alcohol use: Not Currently   Drug use: Never         OPHTHALMIC EXAM:  Base Eye Exam     Visual Acuity (ETDRS)       Right Left   Dist [place] CF at face CF at face         Tonometry (Tonopen, 1:24 PM)       Right Left   Pressure 14 16         Pupils       Pupils Dark Light Shape React APD   Right PERRL 3 2 Round Brisk None   Left PERRL 3 2 Round Brisk None         Visual Fields       Left Right   Restrictions Total inferior nasal deficiency Total superior temporal, superior nasal deficiencies         Extraocular Movement       Right Left    Full, Ortho Full, Ortho         Neuro/Psych     Oriented x3: Yes   Mood/Affect: Normal         Dilation     Both eyes: 1.0% Mydriacyl, 2.5% Phenylephrine @ 1:24 PM           Slit Lamp and Fundus Exam     External Exam       Right Left   External Normal Normal         Slit Lamp Exam       Right Left   Lids/Lashes Normal Normal   Conjunctiva/Sclera White and quiet White and quiet   Cornea Clear Clear   Anterior Chamber Deep and quiet Deep and quiet   Iris Round and reactive Round and reactive   Lens Posterior chamber intraocular lens Posterior chamber intraocular lens   Anterior  Vitreous Normal Normal         Fundus Exam  Right Left   Posterior Vitreous Posterior vitreous detachment Posterior vitreous detachment   Disc Peripapillary atrophy, extensive region of nonperfusion, bare white sclera approximately 30 disc areas in size encompassing the macula superior to the nerve inferior and nasal to the nerve Peripapillary atrophy, extensive region of nonperfusion, bare white sclera approximately 30 disc areas in size encompassing the macula superior to the nerve inferior and nasal to the nerve   C/D Ratio 0.45 0.3   Macula Geographic atrophy now into FAZ Geographic atrophy now into FAZ   Vessels Normal Normal   Periphery Normal Normal            IMAGING AND PROCEDURES  Imaging and Procedures for 07/15/22  Color Fundus Photography Optos - OU - Both Eyes       Right Eye Progression has worsened. Disc findings include increased cup to disc ratio. Macula : geographic atrophy. Vessels : normal observations. Periphery : normal observations.   Left Eye Progression has worsened. Disc findings include normal observations. Macula : geographic atrophy. Vessels : normal observations. Periphery : normal observations.   Notes No signs of active CNVM OU             ASSESSMENT/PLAN:  Exudative age-related macular degeneration of both eyes with inactive scar (HCC) No signs of active CNVM OS.  Advanced nonexudative age-related macular degeneration of both eyes with subfoveal involvement Bilateral geographic central atrophy of the RPE and choriocapillaris some progression left eye visual acuity decline from 20/400 now count fingers.  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  I explained to the patient that not likely that she will have measurable decline in further vision and that her mid peripheral vision will remain intact likely for a lifetime  Diabetes mellitus without complication (HCC) No sign of diabetic retinopathy      ICD-10-CM   1. Advanced nonexudative age-related macular degeneration of both eyes with subfoveal involvement  H35.3134 Color Fundus Photography Optos - OU - Both Eyes    2. Exudative age-related macular degeneration of both eyes with inactive scar (HCC)  H35.3233     3. Diabetes mellitus without complication (HCC)  E11.9       OU doing very well with only a moderate progression of geographic atrophy in each eye.  With this low amount of vision loss I do not believe she would be a candidate for Syfovre  2.  3.  Ophthalmic Meds Ordered this visit:  No orders of the defined types were placed in this encounter.      Return in about 1 year (around 07/16/2023) for COLOR FP, OCT.  There are no Patient Instructions on file for this visit.   Explained the diagnoses, plan, and follow up with the patient and they expressed understanding.  Patient expressed understanding of the importance of proper follow up care.   Alford Highland Shamel Galyean M.D. Diseases & Surgery of the Retina and Vitreous [facility] 07/15/22     Abbreviations: M myopia (nearsighted); A astigmatism; H hyperopia (farsighted); P presbyopia; Mrx spectacle prescription;  CTL contact lenses; OD right eye; OS left eye; OU both eyes  XT exotropia; ET esotropia; PEK punctate epithelial keratitis; PEE punctate epithelial erosions; DES dry eye syndrome; MGD meibomian gland dysfunction; ATs artificial tears; PFAT's preservative free artificial tears; NSC nuclear sclerotic cataract; PSC posterior subcapsular cataract; ERM epi-retinal membrane; PVD posterior vitreous detachment; RD retinal detachment; DM diabetes mellitus; DR diabetic retinopathy; NPDR non-proliferative diabetic retinopathy; PDR proliferative diabetic retinopathy; CSME clinically  significant macular edema; DME diabetic macular edema; dbh dot blot hemorrhages; CWS cotton wool spot; POAG primary open angle glaucoma; C/D cup-to-disc ratio; HVF humphrey visual field;  GVF goldmann visual field; OCT optical coherence tomography; IOP intraocular pressure; BRVO Branch retinal vein occlusion; CRVO central retinal vein occlusion; CRAO central retinal artery occlusion; BRAO branch retinal artery occlusion; RT retinal tear; SB scleral buckle; PPV pars plana vitrectomy; VH Vitreous hemorrhage; PRP panretinal laser photocoagulation; IVK intravitreal kenalog; VMT vitreomacular traction; MH Macular hole;  NVD neovascularization of the disc; NVE neovascularization elsewhere; AREDS age related eye disease study; ARMD age related macular degeneration; POAG primary open angle glaucoma; EBMD epithelial/anterior basement membrane dystrophy; ACIOL anterior chamber intraocular lens; IOL intraocular lens; PCIOL posterior chamber intraocular lens; Phaco/IOL phacoemulsification with intraocular lens placement; Loriene [place] photorefractive keratectomy; LASIK laser assisted in situ keratomileusis; HTN hypertension; DM diabetes mellitus; COPD chronic obstructive pulmonary disease

## 2022-07-20 ENCOUNTER — Encounter: Payer: Self-pay | Admitting: Podiatry

## 2022-07-20 ENCOUNTER — Ambulatory Visit (INDEPENDENT_AMBULATORY_CARE_PROVIDER_SITE_OTHER): Payer: Medicare Other | Admitting: Podiatry

## 2022-07-20 DIAGNOSIS — E11649 Type 2 diabetes mellitus with hypoglycemia without coma: Secondary | ICD-10-CM

## 2022-07-20 DIAGNOSIS — B351 Tinea unguium: Secondary | ICD-10-CM | POA: Diagnosis not present

## 2022-07-20 DIAGNOSIS — M2141 Flat foot [pes planus] (acquired), right foot: Secondary | ICD-10-CM | POA: Diagnosis not present

## 2022-07-20 DIAGNOSIS — M79675 Pain in left toe(s): Secondary | ICD-10-CM | POA: Diagnosis not present

## 2022-07-20 DIAGNOSIS — M79674 Pain in right toe(s): Secondary | ICD-10-CM

## 2022-07-20 DIAGNOSIS — M2142 Flat foot [pes planus] (acquired), left foot: Secondary | ICD-10-CM

## 2022-07-25 NOTE — Progress Notes (Signed)
  Subjective:  Patient ID: Kirsten Jensen, female    DOB: 1932/07/27,  MRN: [id number]  Chief Complaint  Patient presents with   Nail Problem     NP - BIL GREAT TOE INGROWNS - BIL CALLUSES   Diabetes    Diabetic foot care    88 y.o. female presents with the above complaint. History confirmed with patient.  Nails cause some discomfort.  She is interested in obtaining diabetic shoes  Objective:  Physical Exam: warm, good capillary refill, no trophic changes or ulcerative lesions, normal DP and PT pulses, normal monofilament exam, and normal sensory exam.  Pes planus bilateral Left Foot: dystrophic yellowed discolored nail plates with subungual debris Right Foot: dystrophic yellowed discolored nail plates with subungual debris   Assessment:   1. Pain due to onychomycosis of toenails of both feet   2. Type 2 diabetes mellitus with hypoglycemia without coma, without long-term current use of insulin (HCC)   3. Pes planus of both feet      Plan:  Patient was evaluated and treated and all questions answered.   Patient educated on diabetes. Discussed proper diabetic foot care and discussed risks and complications of disease. Educated patient in depth on reasons to return to the office immediately should he/she discover anything concerning or new on the feet. All questions answered. Discussed proper shoes as well.  She will be scheduled for diabetic shoe fitting  Discussed the etiology and treatment options for the condition in detail with the patient. Educated patient on the topical and oral treatment options for mycotic nails. Recommended debridement of the nails today. Sharp and mechanical debridement performed of all painful and mycotic nails today. Nails debrided in length and thickness using a nail nipper to level of comfort. Discussed treatment options including appropriate shoe gear. Follow up as needed for painful nails.      Return in about 3 months (around 10/20/2022) for at  risk diabetic foot care.

## 2022-07-28 ENCOUNTER — Ambulatory Visit (INDEPENDENT_AMBULATORY_CARE_PROVIDER_SITE_OTHER): Payer: Medicare Other

## 2022-07-28 DIAGNOSIS — I495 Sick sinus syndrome: Secondary | ICD-10-CM

## 2022-07-28 LAB — CUP PACEART REMOTE DEVICE CHECK
Battery Remaining Longevity: 48 mo
Battery Remaining Percentage: 78 %
Brady Statistic RA Percent Paced: 98 %
Brady Statistic RV Percent Paced: 51 %
Date Time Interrogation Session: 20230816011100
Implantable Lead Implant Date: 20191003
Implantable Lead Implant Date: 20191003
Implantable Lead Location: 753859
Implantable Lead Location: 753860
Implantable Lead Model: 7741
Implantable Lead Model: 7742
Implantable Lead Serial Number: 1047491
Implantable Lead Serial Number: 1074664
Implantable Pulse Generator Implant Date: 20191003
Lead Channel Impedance Value: 708 Ohm
Lead Channel Impedance Value: 762 Ohm
Lead Channel Pacing Threshold Amplitude: 0.7 V
Lead Channel Pacing Threshold Amplitude: 1.2 V
Lead Channel Pacing Threshold Pulse Width: 0.4 ms
Lead Channel Pacing Threshold Pulse Width: 0.4 ms
Lead Channel Setting Pacing Amplitude: 2 V
Lead Channel Setting Pacing Amplitude: 2.5 V
Lead Channel Setting Pacing Pulse Width: 0.4 ms
Lead Channel Setting Sensing Sensitivity: 1 mV
Pulse Gen Serial Number: 444544

## 2022-08-26 NOTE — Progress Notes (Signed)
Remote pacemaker transmission.   
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
## 2022-09-09 ENCOUNTER — Encounter: Payer: Self-pay | Admitting: Student

## 2022-09-09 ENCOUNTER — Ambulatory Visit: Payer: Medicare Other | Attending: Student | Admitting: Student

## 2022-09-09 VITALS — BP 101/44 | HR 60 | Ht 67.0 in | Wt 246.0 lb

## 2022-09-09 DIAGNOSIS — I2510 Atherosclerotic heart disease of native coronary artery without angina pectoris: Secondary | ICD-10-CM | POA: Insufficient documentation

## 2022-09-09 DIAGNOSIS — I482 Chronic atrial fibrillation, unspecified: Secondary | ICD-10-CM | POA: Diagnosis present

## 2022-09-09 DIAGNOSIS — I495 Sick sinus syndrome: Secondary | ICD-10-CM | POA: Diagnosis present

## 2022-09-09 LAB — CUP PACEART INCLINIC DEVICE CHECK
Date Time Interrogation Session: 20230928101423
Implantable Lead Implant Date: 20191003
Implantable Lead Implant Date: 20191003
Implantable Lead Location: 753859
Implantable Lead Location: 753860
Implantable Lead Model: 7741
Implantable Lead Model: 7742
Implantable Lead Serial Number: 1047491
Implantable Lead Serial Number: 1074664
Implantable Pulse Generator Implant Date: 20191003
Lead Channel Impedance Value: 729 Ohm
Lead Channel Impedance Value: 797 Ohm
Lead Channel Pacing Threshold Amplitude: 0.9 V
Lead Channel Pacing Threshold Amplitude: 1 V
Lead Channel Pacing Threshold Pulse Width: 0.4 ms
Lead Channel Pacing Threshold Pulse Width: 0.4 ms
Lead Channel Sensing Intrinsic Amplitude: 2 mV
Lead Channel Sensing Intrinsic Amplitude: 2.1 mV
Lead Channel Setting Pacing Amplitude: 2 V
Lead Channel Setting Pacing Amplitude: 2.5 V
Lead Channel Setting Pacing Pulse Width: 0.4 ms
Lead Channel Setting Sensing Sensitivity: 1 mV
Pulse Gen Serial Number: 444544

## 2022-09-09 LAB — BASIC METABOLIC PANEL
BUN/Creatinine Ratio: 23 (ref 12–28)
BUN: 26 mg/dL (ref 8–27)
CO2: 26 mmol/L (ref 20–29)
Calcium: 9.9 mg/dL (ref 8.7–10.3)
Chloride: 98 mmol/L (ref 96–106)
Creatinine, Ser: 1.12 mg/dL — ABNORMAL HIGH (ref 0.57–1.00)
Glucose: 191 mg/dL — ABNORMAL HIGH (ref 70–99)
Potassium: 4.8 mmol/L (ref 3.5–5.2)
Sodium: 137 mmol/L (ref 134–144)
eGFR: 47 mL/min/{1.73_m2} — ABNORMAL LOW (ref >59)

## 2022-09-09 LAB — CBC
Hematocrit: 33.7 % — ABNORMAL LOW (ref 34.0–46.6)
Hemoglobin: 11.6 g/dL (ref 11.1–15.9)
MCH: 33.4 pg — ABNORMAL HIGH (ref 26.6–33.0)
MCHC: 34.4 g/dL (ref 31.5–35.7)
MCV: 97 fL (ref 79–97)
Platelets: 203 10*3/uL (ref 150–450)
RBC: 3.47 x10E6/uL — ABNORMAL LOW (ref 3.77–5.28)
RDW: 12.8 % (ref 11.7–15.4)
WBC: 6.7 10*3/uL (ref 3.4–10.8)

## 2022-09-09 NOTE — Patient Instructions (Signed)
Medication Instructions:  Your physician recommends that you continue on your current medications as directed. Please refer to the Current Medication list given to you today.  *If you need a refill on your cardiac medications before your next appointment, please call your pharmacy*   Lab Work: TODAY: BMET, CBC  If you have labs (blood work) drawn today and your tests are completely normal, you will receive your results only by: [place] (if you have MyChart) OR A paper copy in the mail If you have any lab test that is abnormal or we need to change your treatment, we will call you to review the results.   Follow-Up: At [facility], you and your health needs are our priority.  As part of our continuing mission to provide you with exceptional heart care, we have created designated Provider Care Teams.  These Care Teams include your primary Cardiologist (physician) and Advanced Practice Providers (APPs -  Physician Assistants and Nurse Practitioners) who all work together to provide you with the care you need, when you need it.   Your next appointment:   1 year(s)  The format for your next appointment:   In Person  Provider:   Virl Axe, MD   Important Information About Sugar

## 2022-09-13 ENCOUNTER — Encounter (INDEPENDENT_AMBULATORY_CARE_PROVIDER_SITE_OTHER): Payer: Medicare Other | Admitting: Ophthalmology

## 2022-09-20 ENCOUNTER — Other Ambulatory Visit (HOSPITAL_COMMUNITY): Payer: Self-pay | Admitting: Sports Medicine

## 2022-09-20 ENCOUNTER — Other Ambulatory Visit: Payer: Self-pay | Admitting: Sports Medicine

## 2022-09-20 DIAGNOSIS — M8430XA Stress fracture, unspecified site, initial encounter for fracture: Secondary | ICD-10-CM

## 2022-09-20 DIAGNOSIS — M1711 Unilateral primary osteoarthritis, right knee: Secondary | ICD-10-CM

## 2022-09-21 ENCOUNTER — Encounter (INDEPENDENT_AMBULATORY_CARE_PROVIDER_SITE_OTHER): Payer: Medicare Other | Admitting: Ophthalmology

## 2022-09-21 ENCOUNTER — Other Ambulatory Visit: Payer: Self-pay | Admitting: Sports Medicine

## 2022-09-21 DIAGNOSIS — M4840XS Fatigue fracture of vertebra, site unspecified, sequela of fracture: Secondary | ICD-10-CM

## 2022-09-21 DIAGNOSIS — S83241S Other tear of medial meniscus, current injury, right knee, sequela: Secondary | ICD-10-CM

## 2022-09-27 ENCOUNTER — Telehealth: Payer: Self-pay | Admitting: Podiatry

## 2022-09-27 NOTE — Telephone Encounter (Signed)
Pt called upset that she was wanting diabetic shoes and stated that she seen Dr Sherryle Lis in august and was told we were not doing shoes until October and she called today to schedule an appt I explained that we are not doing diabetic shoes for the remainder of this year. I did apologize and explain that we are working on that dept since the gentleman that was working there left [place] unexpectedly. She stated she changed podiatry offices that she was happy with so she could get diabetic shoes as her last podiatrist  office did not do diabetic shoes.

## 2022-10-04 ENCOUNTER — Telehealth: Payer: Self-pay | Admitting: Internal Medicine

## 2022-10-04 NOTE — Telephone Encounter (Signed)
Patient is calling about her lab results.  Pt c/o swelling: STAT is pt has developed SOB within 24 hours  If swelling, where is the swelling located? Swelling legs, especially around her knees and thighs and around her abdomen.   How much weight have you gained and in what time span? Hasn't gained any weight.   Have you gained 3 pounds in a day or 5 pounds in a week? no  Do you have a log of your daily weights (if so, list)? She lives at an assisted living place they keep log of her daily weights.   Are you currently taking a fluid pill? Yes   Are you currently SOB? No   Have you traveled recently? no

## 2022-10-04 NOTE — Telephone Encounter (Signed)
Spoke with pt who reports a 4 pound weigh gain over the weekend.  Pt states she feels as if she is retaining fluid above her knees and into her abdomen.  She is weighed daily at her facility.  Pt states she ate out a few times over the weekend with family which consisted of burgers, fries and soda.  She denies current CP or new SOB.  She has not talked to the nurse at her facility regarding possible fluid retention.  She is taking medication as prescribed. Pt advised will forward message to Oda Kilts, PA-C who saw pt on 09/09/2022.  Encouraged pt to discuss feelings of fluid retention with facility nurse.  Discussed maintaining a low sodium diet, water intake and physical movement.  Pt verbalizes understanding and agrees with current plan.

## 2022-10-05 ENCOUNTER — Ambulatory Visit
Admission: RE | Admit: 2022-10-05 | Discharge: 2022-10-05 | Disposition: A | Discharge status: Home / Self Care | Payer: Medicare Other | Source: Ambulatory Visit | Attending: Sports Medicine | Admitting: Sports Medicine

## 2022-10-05 DIAGNOSIS — S83241S Other tear of medial meniscus, current injury, right knee, sequela: Secondary | ICD-10-CM

## 2022-10-05 DIAGNOSIS — M4840XS Fatigue fracture of vertebra, site unspecified, sequela of fracture: Secondary | ICD-10-CM

## 2022-10-05 NOTE — Telephone Encounter (Signed)
Spoke with pt and advised of recommendation per Oda Kilts, PA-C.  Pt verbalizes understanding and agrees with current plan.

## 2022-10-11 ENCOUNTER — Telehealth: Payer: Self-pay | Admitting: Podiatry

## 2022-10-11 NOTE — Telephone Encounter (Signed)
Pt called to cancel her appt for November to have her dfc because she was not happy with the way Dr Sherryle Lis trimmed her callus. She also was upset because Dr Sherryle Lis told pt to schedule an appt in October for diabetic shoes and when she called we were not doing the diabetic shoes. She changed podiatrist because she was told we did the diabetic shoes. I did explain that we had to cut off the diabetic shoes orders earlier than expected due to a back log and that we will start back up in January. We were trying to prevent any further delays for our pts. She said that is was told this previously and I did apologize that it was not expected. She still wanted to cancel her appt.

## 2022-10-26 ENCOUNTER — Ambulatory Visit: Payer: Medicare Other | Admitting: Podiatry

## 2022-10-27 ENCOUNTER — Ambulatory Visit (INDEPENDENT_AMBULATORY_CARE_PROVIDER_SITE_OTHER): Payer: Medicare Other

## 2022-10-27 DIAGNOSIS — I495 Sick sinus syndrome: Secondary | ICD-10-CM | POA: Diagnosis not present

## 2022-10-27 DIAGNOSIS — Z950 Presence of cardiac pacemaker: Secondary | ICD-10-CM | POA: Diagnosis not present

## 2022-10-27 LAB — CUP PACEART REMOTE DEVICE CHECK
Battery Remaining Longevity: 48 mo
Battery Remaining Percentage: 74 %
Brady Statistic RA Percent Paced: 98 %
Brady Statistic RV Percent Paced: 72 %
Date Time Interrogation Session: 20231115011100
Implantable Lead Connection Status: 753985
Implantable Lead Connection Status: 753985
Implantable Lead Implant Date: 20191003
Implantable Lead Implant Date: 20191003
Implantable Lead Location: 753859
Implantable Lead Location: 753860
Implantable Lead Model: 7741
Implantable Lead Model: 7742
Implantable Lead Serial Number: 1047491
Implantable Lead Serial Number: 1074664
Implantable Pulse Generator Implant Date: 20191003
Lead Channel Impedance Value: 691 Ohm
Lead Channel Impedance Value: 707 Ohm
Lead Channel Pacing Threshold Amplitude: 0.7 V
Lead Channel Pacing Threshold Amplitude: 1.3 V
Lead Channel Pacing Threshold Pulse Width: 0.4 ms
Lead Channel Pacing Threshold Pulse Width: 0.4 ms
Lead Channel Setting Pacing Amplitude: 2 V
Lead Channel Setting Pacing Amplitude: 2.5 V
Lead Channel Setting Pacing Pulse Width: 0.4 ms
Lead Channel Setting Sensing Sensitivity: 1 mV
Pulse Gen Serial Number: 444544
Zone Setting Status: 755011

## 2022-11-09 ENCOUNTER — Ambulatory Visit (HOSPITAL_COMMUNITY): Payer: Medicare Other | Attending: Sports Medicine

## 2022-11-09 ENCOUNTER — Encounter (HOSPITAL_COMMUNITY): Payer: Self-pay

## 2022-11-19 NOTE — Progress Notes (Signed)
Remote pacemaker transmission.   

## 2022-12-30 ENCOUNTER — Ambulatory Visit (INDEPENDENT_AMBULATORY_CARE_PROVIDER_SITE_OTHER): Payer: Medicare Other | Admitting: Behavioral Health

## 2022-12-30 DIAGNOSIS — F4320 Adjustment disorder, unspecified: Secondary | ICD-10-CM | POA: Diagnosis not present
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
## 2023-01-07 ENCOUNTER — Ambulatory Visit (INDEPENDENT_AMBULATORY_CARE_PROVIDER_SITE_OTHER): Payer: Medicare Other | Admitting: Behavioral Health

## 2023-01-07 DIAGNOSIS — F4320 Adjustment disorder, unspecified: Secondary | ICD-10-CM

## 2023-01-07 NOTE — Progress Notes (Unsigned)
      [facility] Counselor/Therapist Progress Note  Patient ID: Kirsten Jensen, MRN: [id number],    Date: 01/09/2023  Time Spent: 60 minutes   Treatment Type: Individual Therapy  Reported Symptoms: The patient states "Nothing serious".  Mental Status Exam:  Appearance:   Casual     Behavior:  Blaming and Suspicious  Motor:  Normal  Speech/Language:   Clear and Coherent  Affect:  Appropriate  Mood:  normal  Thought process:  normal  Thought content:    WNL  Sensory/Perceptual disturbances:    WNL  Orientation:  oriented to person  Attention:  Good  Concentration:  Good  Memory:  WNL  Fund of knowledge:   Good  Insight:    Good  Judgment:   Good  Impulse Control:  Good   Risk Assessment: Danger to Self:  No Self-injurious Behavior: No Danger to Others: No Duty to Warn:no Physical Aggression / Violence:No  Access to Firearms a concern: No  Gang Involvement:No   Subjective:    The patient reports to attend meetings on the phone for [employer] daily. She reports attending the meetings are not helpful and states she has not been able to attend AA meetings due to having issues with her knee. She states concerns regarding her residence and reports she has shared her concerns with her son and the resident facilitator. The patient reports her and her son have differences regarding her concerns. She states she has discussed her concerns with the resident facilitator. She reports the state is investigating her facility. She states belief to be an advocate for herself and others. She described her moods as "Nothing serious" she described her moods regarding processing the deaths of her loved ones as "I think its been very positive I have addressed it in the steps and felt like I have made amends". The patient reports "I have a done a lot of work on grief and loss with my husband I think at times I feel sad". The patient reports to honor the lost of her loved ones as "By being  me the person that I am". The patient expressed gratitude for what she has accomplished in life. The patient expressed gratitude and reflected on 12 step programs steps that she has utilized to help her in life. The patient identified her take away as "Being able to stay in the moment and doing things for others". Patient denied SI/HI, AH/VH.   Counselor conducted check in. Counselor discussed the patients ability to manage her moods regarding grief and loss. Counselor utilized reflective listening and validated the patients concerns. Counselor questioned if the patient has expressed her concerns regarding her current residence to her son or the resident facilitator. Counselor assessed for SI/HI, AH/VH.   Interventions: Motivational Interviewing and Grief Therapy  Diagnosis:   ICD-10-CM   1. Adjustment disorder, unspecified type  F43.20       Plan:   Long Term Goal: Begin a healthy grieving process. Short Term Goal: Explore and resolve grief and loss issues. Objective: Explore and resolve issues of grief/loss as they arise.           Jannifer Hick, [facility]

## 2023-01-09 ENCOUNTER — Encounter: Payer: Self-pay | Admitting: Behavioral Health

## 2023-01-13 ENCOUNTER — Ambulatory Visit: Payer: Medicare Other | Admitting: Behavioral Health

## 2023-01-26 ENCOUNTER — Ambulatory Visit (INDEPENDENT_AMBULATORY_CARE_PROVIDER_SITE_OTHER): Payer: Medicare Other

## 2023-01-26 DIAGNOSIS — I495 Sick sinus syndrome: Secondary | ICD-10-CM

## 2023-02-01 LAB — CUP PACEART REMOTE DEVICE CHECK
Battery Remaining Longevity: 42 mo
Battery Remaining Percentage: 71 %
Brady Statistic RA Percent Paced: 98 %
Brady Statistic RV Percent Paced: 70 %
Date Time Interrogation Session: 20240220011100
Implantable Lead Connection Status: 753985
Implantable Lead Connection Status: 753985
Implantable Lead Implant Date: 20191003
Implantable Lead Implant Date: 20191003
Implantable Lead Location: 753859
Implantable Lead Location: 753860
Implantable Lead Model: 7741
Implantable Lead Model: 7742
Implantable Lead Serial Number: 1047491
Implantable Lead Serial Number: 1074664
Implantable Pulse Generator Implant Date: 20191003
Lead Channel Impedance Value: 721 Ohm
Lead Channel Impedance Value: 735 Ohm
Lead Channel Pacing Threshold Amplitude: 0.7 V
Lead Channel Pacing Threshold Amplitude: 1.2 V
Lead Channel Pacing Threshold Pulse Width: 0.4 ms
Lead Channel Pacing Threshold Pulse Width: 0.4 ms
Lead Channel Setting Pacing Amplitude: 2 V
Lead Channel Setting Pacing Amplitude: 2.5 V
Lead Channel Setting Pacing Pulse Width: 0.4 ms
Lead Channel Setting Sensing Sensitivity: 1 mV
Pulse Gen Serial Number: 444544
Zone Setting Status: 755011

## 2023-02-08 ENCOUNTER — Ambulatory Visit: Payer: Medicare Other | Admitting: Neurology

## 2023-02-15 ENCOUNTER — Ambulatory Visit (INDEPENDENT_AMBULATORY_CARE_PROVIDER_SITE_OTHER): Payer: Medicare Other | Admitting: Neurology

## 2023-02-15 ENCOUNTER — Encounter: Payer: Self-pay | Admitting: Neurology

## 2023-02-15 VITALS — BP 122/52 | HR 61 | Ht 67.0 in | Wt 249.0 lb

## 2023-02-15 DIAGNOSIS — G4733 Obstructive sleep apnea (adult) (pediatric): Secondary | ICD-10-CM | POA: Diagnosis not present

## 2023-02-15 NOTE — Progress Notes (Signed)
Subjective:    Patient ID: Kirsten Jensen is a 89 y.o. female.  HPI    Interim history:   Kirsten Jensen is an 89 year old right-handed woman with an underlying complex medical history of diabetes, tachycardia-bradycardia syndrome, status post pacemaker insertion, history of atrial fibrillation, coronary artery disease, hypertension, hyperlipidemia, macular degeneration with legal blindness, and obesity, who presents for follow-up consultation of her obstructive sleep apnea, on treatment with AutoPap therapy.  She is unaccompanied today and presents after a longer gap of nearly 2 years. [place].  I first met her at the request of her primary care physician on 03/19/2021, at which time she reported a prior diagnosis of obstructive sleep apnea.  [place]  She had an older CPAP machine.  She was advised to proceed with reevaluation with a sleep study.  She had a home sleep test on 04/22/2021 which showed severe obstructive sleep apnea with a total AHI of 59.9/hour and O2 nadir of 70%.  Moderate to loud snoring was detected.  Based on the test results she was advised to continue with PAP therapy, I prescribed a new AutoPap machine.      Today, 02/15/2023: I reviewed her PAP compliance data from the past 90 days from 11/17/2022 through 02/14/2023, during which time she used her machine 63 days with percent use days greater than 4 hours at 30%, indicating suboptimal compliance with an average usage for days on treatment of 3 hours and 48 minutes, residual AHI mildly elevated at 11/h, pressure of 14 cm, leak on the high side.  In the past 30 days she used her machine 23 days with percent use days greater than 4 hours at 20% only, average usage for days on treatment of 3 hours and 8 minutes.  She reports that the mask does not fit very well.  She has had some weight loss.  She needs new supplies.  I suggested we proceed with a  mask refit appointment through her DME provider.  She does have some trouble maintaining sleep.  She typically goes to bed around 9 but wakes up in the middle of the night with difficulty going back to sleep.  She does have some mouth dryness.  Upon further asking she does not actually use distilled water in her machine and only regular tap water.  She is reminded to use distilled water only in the humidifier of her CPAP machine.    The patient's allergies, current medications, family history, past medical history, past social history, past surgical history and problem list were reviewed and updated as appropriate.   Previously:   03/19/21: (She) was previously diagnosed with obstructive sleep apnea and placed on CPAP therapy.  I reviewed your office note and phone note. Prior sleep study results are not available for my review today.  She reports testing was over 15 years ago when she was still residing in [place].  She has had more than one CPAP machine and this particular machine she has is about 89-year-old.  She has a set up date on the download of 04/19/2018.  I reviewed her last 30 days on the CPAP download and she has only used her machine for days.  She needs new supplies, she has been using a medium Respironics fullface mask but the mask broke.  She has not established with a local DME company here, prior company was [employer], an Social research officer, government company now.  Her pressure is set at 14 cm.  She has a Ambulance person.  She has an average AHI of 13.3/h.  She was not aware of the recall on the [employer].  She has not registered her machine or talk to her DME company about getting a replacement as she was not aware of the recall.  She is willing to register her machine.  She generally has a variable sleep schedule, tries to be in bed somewhere between 9 PM and midnight and rise time varies as well, she takes her Synthroid around 7 AM.  She does have nocturia at least  once or twice per average night.  She is a non-smoker.  She drinks caffeine in the form of coffee, 1 cup/day in the mornings, about 10 ounces.  She has not had alcohol in about 40 years.  She is a retired Education officer, museum and substance abuse Social worker.  She moved from [place] in June 2021 to be closer to her son.  She has 2 grandchildren from him, grandson is about 14 years old and granddaughter is 43.    Of note, she was hospitalized in November 2021 from 10/30/2020 through 11/08/2020, due to acute anemia, secondary to GI bleed, complicated by acute on chronic diastolic congestive heart failure, acute on chronic kidney injury.     Her Past Medical History Is Significant For: Past Medical History:  Diagnosis Date   AF (paroxysmal atrial fibrillation) ([facility]) 03/25/2021   Atrial fibrillation ([facility])    Coronary artery disease    stenting x 2   Diabetes ([facility])    Dyslipidemia 04/05/2020   Hypertension    Morbid obesity ([place])    Pacemaker    Paroxysmal atrial fibrillation ([place]) 11/06/2019   Secondary hypercoagulable state ([facility]) 11/06/2019   T2DM (type 2 diabetes mellitus) ([facility]) 04/05/2020   Tachycardia-bradycardia syndrome ([place]) 03/17/2020    Her Past Surgical History Is Significant For: Past Surgical History:  Procedure Laterality Date   COLONOSCOPY N/A 11/02/2020   Procedure: COLONOSCOPY;  Surgeon: Clarene Essex, MD;  Location: [facility];  Service: Endoscopy;  Laterality: N/A;   ESOPHAGOGASTRODUODENOSCOPY N/A 11/01/2020   Procedure: ESOPHAGOGASTRODUODENOSCOPY (EGD);  Surgeon: Clarene Essex, MD;  Location: [facility];  Service: Endoscopy;  Laterality: N/A;    Her Family History Is Significant For: Family History  Problem Relation Age of Onset   Hypertension Mother    Stroke Mother        died at age 53   CAD Father        MI at age of 75   Breast cancer Daughter    Sleep apnea Son     Her Social History Is Significant For: Social History   Socioeconomic History   Marital status:  Widowed    Spouse name: Not on file   Number of children: Not on file   Years of education: Not on file   Highest education level: Not on file  Occupational History   Not on file  Tobacco Use   Smoking status: Never   Smokeless tobacco: Never  Vaping Use   Vaping Use: Never used  Substance and Sexual Activity   Alcohol use: Not Currently   Drug use: Never   Sexual activity: Not on file  Other Topics Concern   Not on file  Social History Narrative   Not on file   Social Determinants of Health   Financial Resource Strain: Not on file  Food Insecurity: Not on file  Transportation Needs:  Not on file  Physical Activity: Not on file  Stress: Not on file  Social Connections: Not on file    Her Allergies Are:  No Known Allergies:   Her Current Medications Are:  Outpatient Encounter Medications as of 02/15/2023  Medication Sig   acetaminophen (TYLENOL) 500 MG tablet Take 500 mg by mouth 2 (two) times daily as needed for mild pain.    ascorbic acid (VITAMIN C) 500 MG tablet Take 1 tablet (500 mg total) by mouth daily.   buPROPion (WELLBUTRIN) 100 MG tablet Take 100 mg by mouth daily.   cholecalciferol (VITAMIN D3) 25 MCG (1000 UNIT) tablet Take 1,000 Units by mouth daily.   diltiazem (CARDIZEM SR) 120 MG 12 hr capsule Take by mouth.   ELIQUIS 2.5 MG TABS tablet Take 2.5 mg by mouth 2 (two) times daily.   gemfibrozil (LOPID) 600 MG tablet Take 600 mg by mouth 2 (two) times daily.   HYDROcodone-acetaminophen (NORCO/VICODIN) 5-325 MG tablet Take 1 tablet by mouth as needed.   JARDIANCE 10 MG TABS tablet Take 10 mg by mouth daily.   ketoconazole (NIZORAL) 2 % shampoo Apply 1 application topically 3 (three) times a week. Let sit for 3 minutes before rinsing.   Tuesday Thursday Saturday   levothyroxine (SYNTHROID) 50 MCG tablet Take 50 mcg by mouth daily before breakfast.   magnesium oxide (MAG-OX) 400 MG tablet Take 1 tablet (400 mg total) by mouth daily.   metoprolol succinate  (TOPROL XL) 25 MG 24 hr tablet Take 1 tablet (25 mg total) by mouth daily.   Multiple Vitamins-Minerals (OCUVITE ADULT 50+ PO) Take 1 capsule by mouth daily.   pantoprazole (PROTONIX) 40 MG tablet Take 1 tablet (40 mg total) by mouth 2 (two) times daily before a meal.   pravastatin (PRAVACHOL) 20 MG tablet Take 1 tablet (20 mg total) by mouth daily at 6 PM.   ramipril (ALTACE) 5 MG capsule Take 5 mg by mouth daily.   vitamin B-12 (CYANOCOBALAMIN) 1000 MCG tablet Take 1,000 mcg by mouth daily.   ferrous sulfate 325 (65 FE) MG tablet Take 1 tablet (325 mg total) by mouth daily with breakfast.   No facility-administered encounter medications on file as of 02/15/2023.  :  Review of Systems:  Out of a complete 14 point review of systems, all are reviewed and negative with the exception of these symptoms as listed below:   Review of Systems  Neurological:        Pt here CPAP f/u  Pt states wears CPAP everynight  . Pt states having trouble  with mask fitting    ESS:5    Objective:  Neurological Exam  Physical Exam Physical Examination:   Vitals:   02/15/23 1027  BP: (!) 122/52  Pulse: 61    General Examination: The patient is a very pleasant 89 y.o. female in no acute distress. She appears well-developed and well-nourished and well groomed.   HEENT: Normocephalic, atraumatic, pupils are equal, she is legally blind. Hearing is grossly intact. Face is symmetric with normal facial animation. Speech is clear with no dysarthria noted. There is no hypophonia. There is no lip, neck/head, jaw or voice tremor. Neck is supple with full range of active motion. Oropharynx exam reveals: moderate mouth dryness, adequate dental hygiene with partial dentures in place, mild airway crowding secondary all.  Tongue protrudes centrally and palate elevates symmetrically.     Chest: Clear to auscultation without wheezing, rhonchi or crackles noted.   Heart: S1+S2+0, regular and  normal without murmurs, rubs  or gallops noted.    Abdomen: Soft, non-tender and non-distended.   Extremities: There is trace edema in the distal lower extremities bilaterally.    Skin: Warm and dry without trophic changes noted.    Musculoskeletal: exam reveals no obvious joint deformities, tenderness or joint swelling or erythema.    Neurologically:  Mental status: The patient is awake, alert and oriented in all 4 spheres. Her immediate and remote memory, attention, language skills and fund of knowledge are appropriate. There is no evidence of aphasia, agnosia, apraxia or anomia. Speech is clear with normal prosody and enunciation. Thought process is linear. Mood is normal and affect is normal.  Cranial nerves II - XII are as described above under HEENT exam.  Motor exam: Normal bulk, strength and tone is noted. There is no tremor, Romberg is not tested due to safety concerns, fine motor skills are grossly intact.   Cerebellar testing: No dysmetria or intention tremor. There is no truncal or gait ataxia.  Sensory exam: intact to light touch in the upper and lower extremities.    Assessment and Plan:    In summary, Kirsten Jensen is an 89 year old right-handed woman with an underlying complex medical history of diabetes, tachycardia-bradycardia syndrome, status post pacemaker insertion, history of atrial fibrillation, coronary artery disease, hypertension, hyperlipidemia, macular degeneration with legal blindness, and obesity, who presents for follow-up consultation of her obstructive sleep apnea, on treatment with a CPAP machine from [employer].  She got a replacement machine after her previous machine was recalled.  She is advised to be consistent with her CPAP usage, pressure is at 14 cm, residual AHI in the mildly elevated range but she does have a higher leakage.  She is advised to proceed with a mask refit appointment with her DME provider.  She endorses that she does have their number.  She is advised that they  should reach out to her but if she does not hear from them, she should call them.  I placed an order for mask refit as well as supplies.  She is advised to follow-up routinely in this clinic in 6 months to see one of our nurse practitioners.  Of note, home sleep testing from May 2022 showed severe sleep apnea.  She has a longstanding history of obstructive sleep apnea and has been on PAP therapy for many years.  She is reminded to use distilled water only in the humidifier portion of her CPAP machine.  I answered all her questions today and she was in agreement with the plan.  I spent 20 minutes in total face-to-face time and in reviewing records during pre-charting, more than 50% of which was spent in counseling and coordination of care, reviewing test results, reviewing medications and treatment regimen and/or in discussing or reviewing the diagnosis of OSA, the prognosis and treatment options. Pertinent laboratory and imaging test results that were available during this visit with the patient were reviewed by me and considered in my medical decision making (see chart for details).

## 2023-02-15 NOTE — Patient Instructions (Signed)

## 2023-02-24 NOTE — Progress Notes (Signed)
Remote pacemaker transmission.   

## 2023-03-28 ENCOUNTER — Telehealth: Payer: Self-pay | Admitting: Internal Medicine

## 2023-03-28 NOTE — Telephone Encounter (Signed)
Patient is requesting that we send an order to their new assisted living facility to weigh her two a week. Patient stated at her old living facility they would weigh her every day but this new facility needs an order from the doctor for her to be weighed twice a week. Patients new facility is [facility] [address] [phone]. Please advise

## 2023-04-08 ENCOUNTER — Telehealth: Payer: Self-pay | Admitting: Internal Medicine

## 2023-04-08 NOTE — Telephone Encounter (Signed)
Patient is calling back with BP reading:  119/91 HR 94

## 2023-04-08 NOTE — Telephone Encounter (Signed)
Call transferred into triage for patient reporting SOB.  Yesterday while walking around the courtyard w granddaughter she noticed some SOB.  She did not stop walking and this resolved on its own. This morning same thing while up in the bathroom washing, resolved without having to rest.  She is legally blind.  Does not think she has any swelling.  No weights, BP or HR.  Denies any chest pain, heaviness or discomfort.  She is is new (about 3 weeks) to [facility] on [address].  She shares the space with another person who was there before her and there is some "adjusting" she is trying to do with this.    I called [facility] [phone] to speak w a nurse. Was transferred to a VM and left a detailed message about the patient reporting SOB and could they see her and assess for swelling and provide weights and VS back to our office.  The patient is aware [name] is not in the office today and all next week.

## 2023-04-08 NOTE — Telephone Encounter (Signed)
Patient wanted to add to previous note that she is not having any swelling in her legs.

## 2023-04-08 NOTE — Telephone Encounter (Signed)
Pt c/o Shortness Of Breath: STAT if SOB developed within the last 24 hours or pt is noticeably SOB on the phone  1. Are you currently SOB (can you hear that pt is SOB on the phone)? Not right in this moment but earlier and yesterday.  2. How long have you been experiencing SOB? Yesterday  3. Are you SOB when sitting or when up moving around? moving  4. Are you currently experiencing any other symptoms? Just feeling uneasy on her feet and not feeling right.

## 2023-04-27 ENCOUNTER — Ambulatory Visit (INDEPENDENT_AMBULATORY_CARE_PROVIDER_SITE_OTHER): Payer: Medicare Other

## 2023-04-27 DIAGNOSIS — I495 Sick sinus syndrome: Secondary | ICD-10-CM | POA: Diagnosis not present

## 2023-05-03 ENCOUNTER — Telehealth: Payer: Self-pay

## 2023-05-03 LAB — CUP PACEART REMOTE DEVICE CHECK
Battery Remaining Longevity: 36 mo
Battery Remaining Percentage: 57 %
Brady Statistic RA Percent Paced: 73 %
Brady Statistic RV Percent Paced: 68 %
Date Time Interrogation Session: 20240521011200
Implantable Lead Connection Status: 753985
Implantable Lead Connection Status: 753985
Implantable Lead Implant Date: 20191003
Implantable Lead Implant Date: 20191003
Implantable Lead Location: 753859
Implantable Lead Location: 753860
Implantable Lead Model: 7741
Implantable Lead Model: 7742
Implantable Lead Serial Number: 1047491
Implantable Lead Serial Number: 1074664
Implantable Pulse Generator Implant Date: 20191003
Lead Channel Impedance Value: 664 Ohm
Lead Channel Impedance Value: 747 Ohm
Lead Channel Pacing Threshold Amplitude: 1.4 V
Lead Channel Pacing Threshold Pulse Width: 0.4 ms
Lead Channel Setting Pacing Amplitude: 2 V
Lead Channel Setting Pacing Amplitude: 2.5 V
Lead Channel Setting Pacing Pulse Width: 0.4 ms
Lead Channel Setting Sensing Sensitivity: 1 mV
Pulse Gen Serial Number: 444544
Zone Setting Status: 755011

## 2023-05-03 NOTE — Telephone Encounter (Signed)
Following alert received from [employer] received for persistent AF/AFL since 4/26 per trends, overall controlled rates. Burden 11%, Eliquis per PA report.  Note patient called in 04/08/23 reporting of shortness of breath. Attempted to contact apt to f/u and reassess. No answer, LMTCB.

## 2023-05-04 NOTE — Telephone Encounter (Signed)
Spoke with patient, she denied any symptoms of palpitations or increased SOB. Patient stated she walked about 3 miles on Monday and didn't notice any markedly increased SOB.

## 2023-05-05 NOTE — Telephone Encounter (Signed)
Attempted phone call to [facility] and requested to speak to pt's nurse/medical tech.  Call was routed but no one answered call and unable to leave voicemail message.

## 2023-05-12 NOTE — Progress Notes (Signed)
Remote pacemaker transmission.   

## 2023-05-19 NOTE — Telephone Encounter (Signed)
Attempted phone call to [facility] at [phone].  Transferred to Med room per operator.  No answer and no voicemail.

## 2023-05-26 NOTE — Telephone Encounter (Signed)
Phone call to [facility] and spoke to [place], Designer, television/film set.  Requested to speak with pt's nurse.  Call transferred.  No answer and no voicemail.

## 2023-07-13 ENCOUNTER — Telehealth: Payer: Self-pay | Admitting: Family Medicine

## 2023-07-13 NOTE — Telephone Encounter (Signed)
LVM and sent mychart msg informing pt of need to reschedule 09/01/23 appt - NP out

## 2023-07-18 ENCOUNTER — Encounter (INDEPENDENT_AMBULATORY_CARE_PROVIDER_SITE_OTHER): Payer: Medicare Other | Admitting: Ophthalmology

## 2023-07-26 LAB — CUP PACEART REMOTE DEVICE CHECK
Battery Remaining Longevity: 30 mo
Battery Remaining Percentage: 52 %
Brady Statistic RA Percent Paced: 40 %
Brady Statistic RV Percent Paced: 70 %
Date Time Interrogation Session: 20240813011100
Implantable Lead Connection Status: 753985
Implantable Lead Connection Status: 753985
Implantable Lead Implant Date: 20191003
Implantable Lead Implant Date: 20191003
Implantable Lead Location: 753859
Implantable Lead Location: 753860
Implantable Lead Model: 7741
Implantable Lead Model: 7742
Implantable Lead Serial Number: 1047491
Implantable Lead Serial Number: 1074664
Implantable Pulse Generator Implant Date: 20191003
Lead Channel Impedance Value: 649 Ohm
Lead Channel Impedance Value: 771 Ohm
Lead Channel Pacing Threshold Amplitude: 1.5 V
Lead Channel Pacing Threshold Pulse Width: 0.4 ms
Lead Channel Setting Pacing Amplitude: 2 V
Lead Channel Setting Pacing Amplitude: 2.5 V
Lead Channel Setting Pacing Pulse Width: 0.4 ms
Lead Channel Setting Sensing Sensitivity: 1 mV
Pulse Gen Serial Number: 444544
Zone Setting Status: 755011

## 2023-07-27 ENCOUNTER — Ambulatory Visit (INDEPENDENT_AMBULATORY_CARE_PROVIDER_SITE_OTHER): Payer: Medicare Other

## 2023-07-27 DIAGNOSIS — I495 Sick sinus syndrome: Secondary | ICD-10-CM | POA: Diagnosis not present

## 2023-08-08 ENCOUNTER — Telehealth: Payer: Self-pay | Admitting: Internal Medicine

## 2023-08-08 NOTE — Telephone Encounter (Signed)
Patient stated she stared having swelling in her ankles and right arm on Saturday. Patient stated she has gained 6 pounds since Thursday. Patient did have a few salty meals since Saturday. Encouraged patient to reduce her salt intake and keep a watch on her fluids. Since patient is over due for an appointment, tried to get patient in this week with APP, but she is unable to drive herself and needs assisted living to get her to doctor appointments. Patient wanted [place] to call [facility] [place] to see if she can have appointment offered at [phone]. Did not receive an answer. Left message with patient to call back.

## 2023-08-08 NOTE — Telephone Encounter (Signed)
Pt c/o swelling/edema: STAT if pt has developed SOB within 24 hours  If swelling, where is the swelling located? Right arm, ankles  How much weight have you gained and in what time span? 6 lbs since Thursday  Have you gained 2 pounds in a day or 5 pounds in a week? Yes   Do you have a log of your daily weights (if so, list)? Assisted living do   Are you currently taking a fluid pill? Yes   Are you currently SOB? No   Have you traveled recently in a car or plane for an extended period of time? No

## 2023-08-09 NOTE — Telephone Encounter (Signed)
Left message to call office.  Patient has appointment with Francis Dowse, PA tomorrow.

## 2023-08-09 NOTE — Telephone Encounter (Signed)
Patient is returning call.  °

## 2023-08-10 ENCOUNTER — Ambulatory Visit: Payer: Medicare Other | Admitting: Physician Assistant

## 2023-08-10 NOTE — Telephone Encounter (Signed)
Pt called in to speak about her appt today. She missed her appt due to transportation issues. She states she needs to speak to the nurse about it because she doesn't think she can wait until the next available.

## 2023-08-10 NOTE — Progress Notes (Signed)
Remote pacemaker transmission.   

## 2023-08-10 NOTE — Progress Notes (Deleted)
Cardiology Office Note Date:  08/10/2023  Patient ID:  Hli, Rent [birth year], MRN [id number] PCP:  Thana Ates, MD  Cardiologist/EP:  [name]    Chief Complaint:  *** annual visit  History of Present Illness: Kirsten Jensen is a 89 y.o. female with history of CAD (PCI approx 2018 in [place]), AFib, flutter, tachy-brady w/PPM, HTN, DM, OSA (w/CPAP).   She saw [name] 05/05/22, no reported symptoms, [facility].  Rates poorly controlled metoprolol increased to 50mg  daily. Mildly volume OL, not inclined to be too aggressive. Deferred +/- SGLT2i to her PMD  She saw A. Tillery, PA-C 09/09/22, no reported cardiac symptoms, concerns. AF burden zero, labs updated, no changes made.  *** symptoms *** burden *** volume *** eliquis, bleeding, dose, labs *** CAD meds *** labs,lipids    Device information BSCi dual chamber PPM implanted 09/14/2018 Known low RV sensing amplitude   Past Medical History:  Diagnosis Date   AF (paroxysmal atrial fibrillation) (HCC) 03/25/2021   Atrial fibrillation (HCC)    Coronary artery disease    stenting x 2   Diabetes (HCC)    Dyslipidemia 04/05/2020   Hypertension    Morbid obesity (HCC)    Pacemaker    Paroxysmal atrial fibrillation (HCC) 11/06/2019   Secondary hypercoagulable state (HCC) 11/06/2019   T2DM (type 2 diabetes mellitus) (HCC) 04/05/2020   Tachycardia-bradycardia syndrome (HCC) 03/17/2020    Past Surgical History:  Procedure Laterality Date   COLONOSCOPY N/A 11/02/2020   Procedure: COLONOSCOPY;  Surgeon: Vida Rigger, MD;  Location: [facility] ENDOSCOPY;  Service: Endoscopy;  Laterality: N/A;   ESOPHAGOGASTRODUODENOSCOPY N/A 11/01/2020   Procedure: ESOPHAGOGASTRODUODENOSCOPY (EGD);  Surgeon: Vida Rigger, MD;  Location: [facility] ENDOSCOPY;  Service: Endoscopy;  Laterality: N/A;    Current Outpatient Medications  Medication Sig Dispense Refill   acetaminophen (TYLENOL) 500 MG tablet Take 500 mg by mouth 2  (two) times daily as needed for mild pain.      ascorbic acid (VITAMIN C) 500 MG tablet Take 1 tablet (500 mg total) by mouth daily. 30 tablet 0   buPROPion (WELLBUTRIN) 100 MG tablet Take 100 mg by mouth daily.     cholecalciferol (VITAMIN D3) 25 MCG (1000 UNIT) tablet Take 1,000 Units by mouth daily.     diltiazem (CARDIZEM SR) 120 MG 12 hr capsule Take by mouth.     ELIQUIS 2.5 MG TABS tablet Take 2.5 mg by mouth 2 (two) times daily.     ferrous sulfate 325 (65 FE) MG tablet Take 1 tablet (325 mg total) by mouth daily with breakfast. 30 tablet 3   gemfibrozil (LOPID) 600 MG tablet Take 600 mg by mouth 2 (two) times daily.     HYDROcodone-acetaminophen (NORCO/VICODIN) 5-325 MG tablet Take 1 tablet by mouth as needed.     JARDIANCE 10 MG TABS tablet Take 10 mg by mouth daily.     ketoconazole (NIZORAL) 2 % shampoo Apply 1 application topically 3 (three) times a week. Let sit for 3 minutes before rinsing.   Tuesday Thursday Saturday     levothyroxine (SYNTHROID) 50 MCG tablet Take 50 mcg by mouth daily before breakfast.     magnesium oxide (MAG-OX) 400 MG tablet Take 1 tablet (400 mg total) by mouth daily. 30 tablet 3   metoprolol succinate (TOPROL XL) 25 MG 24 hr tablet Take 1 tablet (25 mg total) by mouth daily. 90 tablet 3   Multiple Vitamins-Minerals (OCUVITE ADULT 50+ PO) Take  1 capsule by mouth daily.     pantoprazole (PROTONIX) 40 MG tablet Take 1 tablet (40 mg total) by mouth 2 (two) times daily before a meal. 30 tablet 3   pravastatin (PRAVACHOL) 20 MG tablet Take 1 tablet (20 mg total) by mouth daily at 6 PM. 30 tablet 0   ramipril (ALTACE) 5 MG capsule Take 5 mg by mouth daily.     vitamin B-12 (CYANOCOBALAMIN) 1000 MCG tablet Take 1,000 mcg by mouth daily.     No current facility-administered medications for this visit.    Allergies:   Patient has no known allergies.   Social History:  The patient  reports that she has never smoked. She has never used smokeless tobacco. She  reports that she does not currently use alcohol. She reports that she does not use drugs.   Family History:  The patient's family history includes Breast cancer in her daughter; CAD in her father; Hypertension in her mother; Sleep apnea in her son; Stroke in her mother.  ROS:  Please see the history of present illness.  All other systems are reviewed and otherwise negative.   PHYSICAL EXAM:  VS:  LMP  (LMP Unknown)  BMI: There is no height or weight on file to calculate BMI. Well nourished, well developed, in no acute distress  HEENT: normocephalic, atraumatic  Neck: no JVD, carotid bruits or masses Cardiac: *** RRR; no significant murmurs, no rubs, or gallops Lungs: *** CTA b/l, no wheezing, rhonchi or rales  Abd: soft, nontender, obese MS: no deformity, age appropriate atrophy Ext:***  b/l LE have ACE wraps in place, I can see/feel though they are soft, minimal pitting at her feet Skin: warm and dry, no rash Neuro:  No gross deficits appreciated Psych: euthymic mood, full affect  *** PPM site is stable, no tethering or discomfort   EKG:  done today and reviewed by myself ***  PPM interrogation done today and reviewed by myself:  ***   10/31/2020; TTE IMPRESSIONS   1. Left ventricular ejection fraction, by estimation, is 55 to 60%. The  left ventricle has normal function. The left ventricle has no regional  wall motion abnormalities. There is mild concentric left ventricular  hypertrophy. Left ventricular diastolic  function could not be evaluated.   2. Right ventricular systolic function is normal. The right ventricular  size is normal. There is moderately elevated pulmonary artery systolic  pressure. The estimated right ventricular systolic pressure is 58.8 mmHg.   3. The mitral valve is degenerative. Mild mitral valve regurgitation.   4. Tricuspid valve regurgitation is mild to moderate.   5. The aortic valve is tricuspid. There is mild calcification of the  aortic  valve. There is mild thickening of the aortic valve. Aortic valve  regurgitation is not visualized. Mild aortic valve sclerosis is present,  with no evidence of aortic valve  stenosis.   6. The inferior vena cava is dilated in size with <50% respiratory  variability, suggesting right atrial pressure of 15 mmHg.   Comparison(s): Changes from prior study are noted. EF 55-60%. Moderately  elevated pulmonary pressures.    04/06/2020: TTE IMPRESSIONS  1. Left ventricular ejection fraction, by estimation, is 50%. The left  ventricle has low normal function. Left ventricular endocardial border not  optimally defined to evaluate regional wall motion. The left ventricular  internal cavity size was mildly  dilated. Left ventricular diastolic parameters are indeterminate.   2. Right ventricular systolic function is normal. The right ventricular  size is mildly enlarged. There is mildly elevated pulmonary artery  systolic pressure. The estimated right ventricular systolic pressure is  39.0 mmHg.   3. Left atrial size was mildly dilated.   4. Right atrial size was mildly dilated.   5. The mitral valve is degenerative. Mild mitral valve regurgitation. No  evidence of mitral stenosis.   6. The aortic valve is normal in structure. Aortic valve regurgitation is  not visualized. No aortic stenosis is present.   7. The inferior vena cava is normal in size with greater than 50%  respiratory variability, suggesting right atrial pressure of 3 mmHg.     Recent Labs: 09/09/2022: BUN 26; Creatinine, Ser 1.12; Hemoglobin 11.6; Platelets 203; Potassium 4.8; Sodium 137  No results found for requested labs within last 365 days.   CrCl cannot be calculated (Patient's most recent lab result is older than the maximum 21 days allowed.).   Wt Readings from Last 3 Encounters:  02/15/23 249 lb (112.9 kg)  09/09/22 246 lb (111.6 kg)  05/05/22 260 lb (117.9 kg)     Other studies reviewed: Additional  studies/records reviewed today include: summarized above  ASSESSMENT AND PLAN:  1. CAD     *** No anginal symptoms     *** On BB, statin and lopid, *** labs monitored via her PMD  2. PPM     *** RV sensing issue is known, no programming changes made, no undersensing was noted while I was with her     *** No programming changes made  3. Persistent AFib, flutter     CHA2DS2Vasc is 6, on Eliquis, *** appropriately dosed     *** % burden      4. HTN     *** Looks OK   5. HLD     Not addressed today  6. HFpEF     ***     ***No SOB, DOE     ***  7. Secondary hypercoagulable state         Disposition: ***  Current medicines are reviewed at length with the patient today.  The patient did not have any concerns regarding medicines.  Kirsten Fredrickson, PA-C 08/10/2023 7:11 AM     [facility] [address] [place] [address] [phone] (office)  [phone] (fax)

## 2023-08-10 NOTE — Telephone Encounter (Signed)
Left message to call back  

## 2023-08-17 NOTE — Telephone Encounter (Signed)
Attempted to call pt and left voicemail message to contact RN at [phone].

## 2023-09-01 ENCOUNTER — Ambulatory Visit: Payer: Medicare Other | Admitting: Family Medicine

## 2023-09-05 ENCOUNTER — Telehealth: Payer: Self-pay | Admitting: Cardiology

## 2023-09-05 NOTE — Telephone Encounter (Signed)
Spoke with pt who reports increasing LEE x 6 weeks.  Pt reports weight increase of 13 pounds during this time.  She missed her last appointment scheduled with Francis Dowse, PA-C on 08/10/2023.  Pt resides in an assisted living facility.  BP and weight are monitored according to pt.  She also reports some mild increase of SOB on exertion over the past 6 weeks as well.   I do not see that pt is on a current diuretic. Encouraged pt to make sure she is following a low sodium diet.  Appointment scheduled for 09/06/2023 with Francis Dowse, PA_C for further evaluation.  Reviewed ED precautions.  Pt verbalizes understanding and agrees with current plan.

## 2023-09-05 NOTE — Telephone Encounter (Signed)
Pt c/o swelling: STAT is pt has developed SOB within 24 hours  How much weight have you gained and in what time span? 13 lbs in 1 1/2 month   If swelling, where is the swelling located? In legs and possibly lower abdomen   Are you currently taking a fluid pill? Yes   Are you currently SOB? Yes, for 3 weeks   Do you have a log of your daily weights (if so, list)?   Have you gained 3 pounds in a day or 5 pounds in a week? 5 lbs in a week   Have you traveled recently? No

## 2023-09-05 NOTE — Progress Notes (Unsigned)
Cardiology Office Note Date:  09/05/2023  Patient ID:  Kirsten, Jensen 1932-10-05, MRN [id number] PCP:  Thana Ates, MD  Cardiologist/EP:  [name]    Chief Complaint:  *** annual visit  History of Present Illness: Kirsten Jensen is a 89 y.o. female with history of CAD (PCI approx 2018 in [place]), AFib, flutter, tachy-brady w/PPM, HTN, DM, OSA (w/CPAP).   She saw [name] 05/05/22, no reported symptoms, [facility].  Rates poorly controlled metoprolol increased to 50mg  daily. Mildly volume OL, not inclined to be too aggressive. Deferred +/- SGLT2i to her PMD  She saw A. Tillery, PA-C 09/09/22, no reported cardiac symptoms, concerns. AF burden zero, labs updated, no changes made.  *** symptoms *** burden *** volume *** eliquis, bleeding, dose, labs *** CAD meds *** labs,lipids    Device information BSCi dual chamber PPM implanted 09/14/2018 Known low RV sensing amplitude   Past Medical History:  Diagnosis Date   AF (paroxysmal atrial fibrillation) (HCC) 03/25/2021   Atrial fibrillation (HCC)    Coronary artery disease    stenting x 2   Diabetes (HCC)    Dyslipidemia 04/05/2020   Hypertension    Morbid obesity (HCC)    Pacemaker    Paroxysmal atrial fibrillation (HCC) 11/06/2019   Secondary hypercoagulable state (HCC) 11/06/2019   T2DM (type 2 diabetes mellitus) (HCC) 04/05/2020   Tachycardia-bradycardia syndrome (HCC) 03/17/2020    Past Surgical History:  Procedure Laterality Date   COLONOSCOPY N/A 11/02/2020   Procedure: COLONOSCOPY;  Surgeon: Vida Rigger, MD;  Location: [facility] ENDOSCOPY;  Service: Endoscopy;  Laterality: N/A;   ESOPHAGOGASTRODUODENOSCOPY N/A 11/01/2020   Procedure: ESOPHAGOGASTRODUODENOSCOPY (EGD);  Surgeon: Vida Rigger, MD;  Location: [facility] ENDOSCOPY;  Service: Endoscopy;  Laterality: N/A;    Current Outpatient Medications  Medication Sig Dispense Refill   acetaminophen (TYLENOL) 500 MG tablet Take 500 mg by mouth 2  (two) times daily as needed for mild pain.      ascorbic acid (VITAMIN C) 500 MG tablet Take 1 tablet (500 mg total) by mouth daily. 30 tablet 0   buPROPion (WELLBUTRIN) 100 MG tablet Take 100 mg by mouth daily.     cholecalciferol (VITAMIN D3) 25 MCG (1000 UNIT) tablet Take 1,000 Units by mouth daily.     diltiazem (CARDIZEM SR) 120 MG 12 hr capsule Take by mouth.     ELIQUIS 2.5 MG TABS tablet Take 2.5 mg by mouth 2 (two) times daily.     ferrous sulfate 325 (65 FE) MG tablet Take 1 tablet (325 mg total) by mouth daily with breakfast. 30 tablet 3   gemfibrozil (LOPID) 600 MG tablet Take 600 mg by mouth 2 (two) times daily.     HYDROcodone-acetaminophen (NORCO/VICODIN) 5-325 MG tablet Take 1 tablet by mouth as needed.     JARDIANCE 10 MG TABS tablet Take 10 mg by mouth daily.     ketoconazole (NIZORAL) 2 % shampoo Apply 1 application topically 3 (three) times a week. Let sit for 3 minutes before rinsing.   Tuesday Thursday Saturday     levothyroxine (SYNTHROID) 50 MCG tablet Take 50 mcg by mouth daily before breakfast.     magnesium oxide (MAG-OX) 400 MG tablet Take 1 tablet (400 mg total) by mouth daily. 30 tablet 3   metoprolol succinate (TOPROL XL) 25 MG 24 hr tablet Take 1 tablet (25 mg total) by mouth daily. 90 tablet 3   Multiple Vitamins-Minerals (OCUVITE ADULT 50+ PO) Take  1 capsule by mouth daily.     pantoprazole (PROTONIX) 40 MG tablet Take 1 tablet (40 mg total) by mouth 2 (two) times daily before a meal. 30 tablet 3   pravastatin (PRAVACHOL) 20 MG tablet Take 1 tablet (20 mg total) by mouth daily at 6 PM. 30 tablet 0   ramipril (ALTACE) 5 MG capsule Take 5 mg by mouth daily.     vitamin B-12 (CYANOCOBALAMIN) 1000 MCG tablet Take 1,000 mcg by mouth daily.     No current facility-administered medications for this visit.    Allergies:   Patient has no known allergies.   Social History:  The patient  reports that she has never smoked. She has never used smokeless tobacco. She  reports that she does not currently use alcohol. She reports that she does not use drugs.   Family History:  The patient's family history includes Breast cancer in her daughter; CAD in her father; Hypertension in her mother; Sleep apnea in her son; Stroke in her mother.  ROS:  Please see the history of present illness.  All other systems are reviewed and otherwise negative.   PHYSICAL EXAM:  VS:  LMP  (LMP Unknown)  BMI: There is no height or weight on file to calculate BMI. Well nourished, well developed, in no acute distress  HEENT: normocephalic, atraumatic  Neck: no JVD, carotid bruits or masses Cardiac: *** RRR; no significant murmurs, no rubs, or gallops Lungs: *** CTA b/l, no wheezing, rhonchi or rales  Abd: soft, nontender, obese MS: no deformity, age appropriate atrophy Ext:***  b/l LE have ACE wraps in place, I can see/feel though they are soft, minimal pitting at her feet Skin: warm and dry, no rash Neuro:  No gross deficits appreciated Psych: euthymic mood, full affect  *** PPM site is stable, no tethering or discomfort   EKG:  done today and reviewed by myself ***  PPM interrogation done today and reviewed by myself:  ***   10/31/2020; TTE IMPRESSIONS   1. Left ventricular ejection fraction, by estimation, is 55 to 60%. The  left ventricle has normal function. The left ventricle has no regional  wall motion abnormalities. There is mild concentric left ventricular  hypertrophy. Left ventricular diastolic  function could not be evaluated.   2. Right ventricular systolic function is normal. The right ventricular  size is normal. There is moderately elevated pulmonary artery systolic  pressure. The estimated right ventricular systolic pressure is 58.8 mmHg.   3. The mitral valve is degenerative. Mild mitral valve regurgitation.   4. Tricuspid valve regurgitation is mild to moderate.   5. The aortic valve is tricuspid. There is mild calcification of the  aortic  valve. There is mild thickening of the aortic valve. Aortic valve  regurgitation is not visualized. Mild aortic valve sclerosis is present,  with no evidence of aortic valve  stenosis.   6. The inferior vena cava is dilated in size with <50% respiratory  variability, suggesting right atrial pressure of 15 mmHg.   Comparison(s): Changes from prior study are noted. EF 55-60%. Moderately  elevated pulmonary pressures.    04/06/2020: TTE IMPRESSIONS  1. Left ventricular ejection fraction, by estimation, is 50%. The left  ventricle has low normal function. Left ventricular endocardial border not  optimally defined to evaluate regional wall motion. The left ventricular  internal cavity size was mildly  dilated. Left ventricular diastolic parameters are indeterminate.   2. Right ventricular systolic function is normal. The right ventricular  size is mildly enlarged. There is mildly elevated pulmonary artery  systolic pressure. The estimated right ventricular systolic pressure is  39.0 mmHg.   3. Left atrial size was mildly dilated.   4. Right atrial size was mildly dilated.   5. The mitral valve is degenerative. Mild mitral valve regurgitation. No  evidence of mitral stenosis.   6. The aortic valve is normal in structure. Aortic valve regurgitation is  not visualized. No aortic stenosis is present.   7. The inferior vena cava is normal in size with greater than 50%  respiratory variability, suggesting right atrial pressure of 3 mmHg.     Recent Labs: 09/09/2022: BUN 26; Creatinine, Ser 1.12; Hemoglobin 11.6; Platelets 203; Potassium 4.8; Sodium 137  No results found for requested labs within last 365 days.   CrCl cannot be calculated (Patient's most recent lab result is older than the maximum 21 days allowed.).   Wt Readings from Last 3 Encounters:  02/15/23 249 lb (112.9 kg)  09/09/22 246 lb (111.6 kg)  05/05/22 260 lb (117.9 kg)     Other studies reviewed: Additional  studies/records reviewed today include: summarized above  ASSESSMENT AND PLAN:  1. CAD     *** No anginal symptoms     *** On BB, statin and lopid, *** labs monitored via her PMD  2. PPM     *** RV sensing issue is known, no programming changes made, no undersensing was noted while I was with her     *** No programming changes made  3. Persistent AFib, flutter     CHA2DS2Vasc is 6, on Eliquis, *** appropriately dosed     *** % burden      4. HTN     *** Looks OK   5. HLD     Not addressed today  6. HFpEF     ***     ***No SOB, DOE     ***  7. Secondary hypercoagulable state         Disposition: ***  Current medicines are reviewed at length with the patient today.  The patient did not have any concerns regarding medicines.  Norma Fredrickson, PA-C 09/05/2023 1:39 PM     [facility] [address] [place] [address] [phone] (office)  [phone] (fax)

## 2023-09-05 NOTE — Telephone Encounter (Signed)
Spoke with pt and scheduled follow up appointment with Francis Dowse, PA-C on 09/06/2023.  See encounter dated 09/05/2023 for complete details.

## 2023-09-06 ENCOUNTER — Ambulatory Visit: Payer: Medicare Other | Attending: Physician Assistant | Admitting: Physician Assistant

## 2023-09-06 ENCOUNTER — Encounter: Payer: Self-pay | Admitting: Physician Assistant

## 2023-09-06 ENCOUNTER — Encounter: Payer: Self-pay | Admitting: *Deleted

## 2023-09-06 VITALS — BP 112/70 | HR 94 | Ht 67.0 in | Wt 254.8 lb

## 2023-09-06 DIAGNOSIS — I10 Essential (primary) hypertension: Secondary | ICD-10-CM | POA: Diagnosis present

## 2023-09-06 DIAGNOSIS — I4819 Other persistent atrial fibrillation: Secondary | ICD-10-CM | POA: Diagnosis present

## 2023-09-06 DIAGNOSIS — Z950 Presence of cardiac pacemaker: Secondary | ICD-10-CM | POA: Insufficient documentation

## 2023-09-06 DIAGNOSIS — I2510 Atherosclerotic heart disease of native coronary artery without angina pectoris: Secondary | ICD-10-CM | POA: Insufficient documentation

## 2023-09-06 DIAGNOSIS — D6869 Other thrombophilia: Secondary | ICD-10-CM | POA: Diagnosis present

## 2023-09-06 DIAGNOSIS — R0602 Shortness of breath: Secondary | ICD-10-CM | POA: Diagnosis present

## 2023-09-06 DIAGNOSIS — I5033 Acute on chronic diastolic (congestive) heart failure: Secondary | ICD-10-CM | POA: Diagnosis present

## 2023-09-06 LAB — CUP PACEART INCLINIC DEVICE CHECK
Date Time Interrogation Session: 20240924184026
Implantable Lead Connection Status: 753985
Implantable Lead Connection Status: 753985
Implantable Lead Implant Date: 20191003
Implantable Lead Implant Date: 20191003
Implantable Lead Location: 753859
Implantable Lead Location: 753860
Implantable Lead Model: 7741
Implantable Lead Model: 7742
Implantable Lead Serial Number: 1047491
Implantable Lead Serial Number: 1074664
Implantable Pulse Generator Implant Date: 20191003
Lead Channel Impedance Value: 646 Ohm
Lead Channel Impedance Value: 750 Ohm
Lead Channel Pacing Threshold Amplitude: 0.9 V
Lead Channel Pacing Threshold Amplitude: 1.3 V
Lead Channel Pacing Threshold Pulse Width: 0.4 ms
Lead Channel Pacing Threshold Pulse Width: 0.4 ms
Lead Channel Sensing Intrinsic Amplitude: 4.1 mV
Lead Channel Sensing Intrinsic Amplitude: 4.2 mV
Lead Channel Setting Pacing Amplitude: 2 V
Lead Channel Setting Pacing Amplitude: 2.5 V
Lead Channel Setting Pacing Pulse Width: 0.4 ms
Lead Channel Setting Sensing Sensitivity: 1 mV
Pulse Gen Serial Number: 444544
Zone Setting Status: 755011

## 2023-09-06 MED ORDER — FUROSEMIDE 20 MG PO TABS: 20.0000 mg | ORAL_TABLET | Freq: Every day | ORAL | 3 refills | Status: DC

## 2023-09-06 MED ORDER — APIXABAN 5 MG PO TABS: 5.0000 mg | ORAL_TABLET | Freq: Two times a day (BID) | ORAL | 2 refills | Status: DC

## 2023-09-06 MED ORDER — FUROSEMIDE 20 MG PO TABS
20.0000 mg | ORAL_TABLET | Freq: Every day | ORAL | 3 refills | Status: DC
Start: 2023-09-06 — End: 2023-09-06

## 2023-09-06 NOTE — Patient Instructions (Addendum)
Medication Instructions:   START TAKING : FUROSEMIDE 20 MG ONCE A DAY   START TAKING :  ELIQUIS 5 MG TWICE A DAY    *If you need a refill on your cardiac medications before your next appointment, please call your pharmacy*   Lab Work: BMET AND CBC TODAY    RETURN FOR  : BMET IN ONE WEEK   If you have labs (blood work) drawn today and your tests are completely normal, you will receive your results only by: MyChart Message (if you have MyChart) OR A paper copy in the mail If you have any lab test that is abnormal or we need to change your treatment, we will call you to review the results.   Testing/Procedures: ON 10-28-24Your physician has recommended that you have a Cardioversion (DCCV). Electrical Cardioversion uses a jolt of electricity to your heart either through paddles or wired patches attached to your chest. This is a controlled, usually prescheduled, procedure. Defibrillation is done under light anesthesia in the hospital, and you usually go home the day of the procedure. This is done to get your heart back into a normal rhythm. You are not awake for the procedure. Please see the instruction sheet given to you today.     Follow-Up: At [facility], you and your health needs are our priority.  As part of our continuing mission to provide you with exceptional heart care, we have created designated Provider Care Teams.  These Care Teams include your primary Cardiologist (physician) and Advanced Practice Providers (APPs -  Physician Assistants and Nurse Practitioners) who all work together to provide you with the care you need, when you need it.  We recommend signing up for the patient portal called "MyChart".  Sign up information is provided on this After Visit Summary.  MyChart is used to connect with patients for Virtual Visits (Telemedicine).  Patients are able to view lab/test results, encounter notes, upcoming appointments, etc.  Non-urgent messages can be sent to your  provider as well.   To learn more about what you can do with MyChart, go to [email or web address].    Your next appointment:  AFTER 10-10-23 CARDIOVERSION   8 week(s)  ( CONTACT  CASSIE HALL/ ANGELINE HAMMER FOR EP SCHEDULING ISSUES )   Provider:   Francis Dowse, PA-C    Other Instructions  Juanita Craver [address] [place] [address]  Dear Ms. Trachtman,  You are scheduled for a Cardioversion on 10-10-23  with Dr. Rennis Golden .  Please arrive at the [facility] at 8 a.m. on the day of your procedure at 10 am.  DIET:            A) Nothing to eat or drink after midnight except your medications with a sip of water after midnight.             Come to the [place] office on 09-06-23  for lab work BMET AND CBC . You will return in 1 week for repeat BMET The lab at [address]   MAKE SURE YOU TAKE YOUR [place] .  HOLD ANY FLUID MEDICATIONS THE MORNING OF PROCEDURE.             YOU MAY TAKE ALL of your remaining medications with a small amount of water.  START NEW medications:FUROSEMIDE 20 MG ONCE A DAY  ( HOLD THE MORNING OF PROCEDURE)   Must have a responsible person to drive you home.  Bring  a current list of your medications and current insurance cards.   * Special note: Every effort is made to have your procedure done on time. Occasionally there are emergencies that present themselves at the hospital that may cause delays. Please be patient if a delay does occur.  If you have any questions after you get home, please call the office at [phone].

## 2023-09-07 LAB — CBC
Hematocrit: 35 % (ref 34.0–46.6)
Hemoglobin: 11.2 g/dL (ref 11.1–15.9)
MCH: 32.2 pg (ref 26.6–33.0)
MCHC: 32 g/dL (ref 31.5–35.7)
MCV: 101 fL — ABNORMAL HIGH (ref 79–97)
Platelets: 265 10*3/uL (ref 150–450)
RBC: 3.48 x10E6/uL — ABNORMAL LOW (ref 3.77–5.28)
RDW: 14 % (ref 11.7–15.4)
WBC: 8 10*3/uL (ref 3.4–10.8)

## 2023-09-07 LAB — BASIC METABOLIC PANEL
BUN/Creatinine Ratio: 24 (ref 12–28)
BUN: 26 mg/dL (ref 8–27)
CO2: 25 mmol/L (ref 20–29)
Calcium: 9.7 mg/dL (ref 8.7–10.3)
Chloride: 99 mmol/L (ref 96–106)
Creatinine, Ser: 1.09 mg/dL — ABNORMAL HIGH (ref 0.57–1.00)
Glucose: 134 mg/dL — ABNORMAL HIGH (ref 70–99)
Potassium: 4.7 mmol/L (ref 3.5–5.2)
Sodium: 139 mmol/L (ref 134–144)
eGFR: 49 mL/min/{1.73_m2} — ABNORMAL LOW (ref >59)

## 2023-09-13 ENCOUNTER — Ambulatory Visit: Payer: Medicare Other | Attending: Physician Assistant

## 2023-09-15 ENCOUNTER — Telehealth: Payer: Self-pay | Admitting: Internal Medicine

## 2023-09-15 DIAGNOSIS — I4819 Other persistent atrial fibrillation: Secondary | ICD-10-CM

## 2023-09-15 NOTE — Telephone Encounter (Signed)
Patient calling to speak to the nurse about her labs. Please advise

## 2023-09-19 NOTE — Telephone Encounter (Signed)
Pt states she wants to speak with Mindi Junker. Please advise

## 2023-09-19 NOTE — Telephone Encounter (Signed)
Pt stated she missed an appt for labwork. Explained that she does not have to have an appt to get labs drawn and can go with her labslip anytime [facility] is open.  Also reviewed most recent lab results from 9/24 with pt. She verbalized understanding and had no further questions at this time.

## 2023-09-19 NOTE — Telephone Encounter (Signed)
Lab orders released to [facility]

## 2023-09-22 ENCOUNTER — Ambulatory Visit: Payer: Medicare Other | Attending: Physician Assistant

## 2023-09-22 LAB — BASIC METABOLIC PANEL
BUN/Creatinine Ratio: 21 (ref 12–28)
BUN: 27 mg/dL (ref 8–27)
CO2: 27 mmol/L (ref 20–29)
Calcium: 10.1 mg/dL (ref 8.7–10.3)
Chloride: 98 mmol/L (ref 96–106)
Creatinine, Ser: 1.26 mg/dL — ABNORMAL HIGH (ref 0.57–1.00)
Glucose: 150 mg/dL — ABNORMAL HIGH (ref 70–99)
Potassium: 4.7 mmol/L (ref 3.5–5.2)
Sodium: 138 mmol/L (ref 134–144)
eGFR: 41 mL/min/{1.73_m2} — ABNORMAL LOW (ref >59)

## 2023-09-27 ENCOUNTER — Telehealth: Payer: Self-pay

## 2023-09-27 NOTE — Telephone Encounter (Signed)
Spoke with patient, her symptoms have improved with the furosemide. Patient states her shortness of breath has improved and that she has lost weight (at facility and they track this for her but she couldn't remember how much she has lost). Confirmed upcoming appointments with patient. No needs at this time

## 2023-09-27 NOTE — Telephone Encounter (Signed)
-----   Message from Nurse Pablo Lawrence sent at 09/26/2023  6:11 PM EDT -----  ----- Message ----- From: Sheilah Pigeon, PA-C Sent: 09/23/2023   4:54 PM EDT To: Kirsten Jensen, CMA  Creat is up some not unexpected with the addition of lasix.  No need for change How is she doing?  Feeling better?  Less SOB?

## 2023-10-07 NOTE — OR Nursing (Signed)
Called patient with pre-procedure instructions for tomorrow.   Patient informed of:   Time to arrive for procedure.0815 Remain NPO past midnight.  Must have a ride home and a responsible adult to remain with them for 24 hours post procedure.  Confirmed blood thinner. Confirmed no breaks in taking blood thinner for 3+ weeks prior to procedure. Confirmed patient stopped all GLP-1s and GLP-2s for at least one week before procedure.   Left a message with patient. Instructed her to call back with any questions.

## 2023-10-09 ENCOUNTER — Encounter (HOSPITAL_COMMUNITY): Payer: Self-pay | Admitting: Internal Medicine

## 2023-10-09 NOTE — Anesthesia Preprocedure Evaluation (Signed)
Anesthesia Evaluation  Patient identified by MRN, date of birth, ID band Patient awake    Reviewed: Allergy & Precautions, NPO status , Patient's Chart, lab work & pertinent test results, reviewed documented beta blocker date and time   Airway Mallampati: III  TM Distance: >3 FB     Dental  (+) Partial Lower, Partial Upper   Pulmonary neg pulmonary ROS   Pulmonary exam normal breath sounds clear to auscultation       Cardiovascular hypertension, Pt. on medications and Pt. on home beta blockers + CAD, + Cardiac Stents and +CHF  + dysrhythmias Atrial Fibrillation + pacemaker  Rhythm:Irregular Rate:Normal  EKG 09/06/23 Atrial Flutter, Ventricular paced rhythm  Echo 10/31/20 1. Left ventricular ejection fraction, by estimation, is 55 to 60%. The  left ventricle has normal function. The left ventricle has no regional  wall motion abnormalities. There is mild concentric left ventricular  hypertrophy. Left ventricular diastolic  function could not be evaluated.   2. Right ventricular systolic function is normal. The right ventricular  size is normal. There is moderately elevated pulmonary artery systolic  pressure. The estimated right ventricular systolic pressure is 58.8 mmHg.   3. The mitral valve is degenerative. Mild mitral valve regurgitation.   4. Tricuspid valve regurgitation is mild to moderate.   5. The aortic valve is tricuspid. There is mild calcification of the  aortic valve. There is mild thickening of the aortic valve. Aortic valve  regurgitation is not visualized. Mild aortic valve sclerosis is present,  with no evidence of aortic valve  stenosis.   6. The inferior vena cava is dilated in size with <50% respiratory  variability, suggesting right atrial pressure of 15 mmHg.     Neuro/Psych Diabetic peripheral neuropathy Macular degeneration  negative psych ROS   GI/Hepatic   Endo/Other  diabetes, Well  Controlled, Type 2, Oral Hypoglycemic AgentsHypothyroidism  Hyperlipidemia  Renal/GU Renal InsufficiencyRenal disease  negative genitourinary   Musculoskeletal negative musculoskeletal ROS (+)    Abdominal  (+) + obese  Peds  Hematology Eliquis therapy   Anesthesia Other Findings   Reproductive/Obstetrics                              Anesthesia Physical Anesthesia Plan  ASA: 3  Anesthesia Plan: General   Post-op Pain Management: Minimal or no pain anticipated   Induction: Intravenous  PONV Risk Score and Plan: 3 and Treatment may vary due to age or medical condition and Propofol infusion  Airway Management Planned: Mask and Natural Airway  Additional Equipment: None  Intra-op Plan:   Post-operative Plan:   Informed Consent: I have reviewed the patients History and Physical, chart, labs and discussed the procedure including the risks, benefits and alternatives for the proposed anesthesia with the patient or authorized representative who has indicated his/her understanding and acceptance.       Plan Discussed with: Anesthesiologist and CRNA  Anesthesia Plan Comments:          Anesthesia Quick Evaluation

## 2023-10-10 ENCOUNTER — Other Ambulatory Visit: Payer: Self-pay

## 2023-10-10 ENCOUNTER — Ambulatory Visit (HOSPITAL_COMMUNITY)
Admission: RE | Admit: 2023-10-10 | Discharge: 2023-10-10 | Disposition: A | Discharge status: Home / Self Care | Payer: Medicare Other | Attending: Internal Medicine | Admitting: Internal Medicine

## 2023-10-10 ENCOUNTER — Ambulatory Visit (HOSPITAL_BASED_OUTPATIENT_CLINIC_OR_DEPARTMENT_OTHER): Payer: Medicare Other | Admitting: Anesthesiology

## 2023-10-10 ENCOUNTER — Ambulatory Visit (HOSPITAL_COMMUNITY): Payer: Medicare Other | Admitting: Anesthesiology

## 2023-10-10 ENCOUNTER — Ambulatory Visit (HOSPITAL_BASED_OUTPATIENT_CLINIC_OR_DEPARTMENT_OTHER)
Admission: RE | Admit: 2023-10-10 | Discharge: 2023-10-10 | Disposition: A | Discharge status: Home / Self Care | Payer: Medicare Other | Source: Home / Self Care | Attending: Internal Medicine | Admitting: Internal Medicine

## 2023-10-10 ENCOUNTER — Encounter (HOSPITAL_COMMUNITY): Payer: Self-pay | Admitting: Internal Medicine

## 2023-10-10 ENCOUNTER — Encounter (HOSPITAL_COMMUNITY)
Admission: RE | Disposition: A | Discharge status: Home / Self Care | Payer: Self-pay | Source: Home / Self Care | Attending: Internal Medicine

## 2023-10-10 DIAGNOSIS — Z7901 Long term (current) use of anticoagulants: Secondary | ICD-10-CM | POA: Diagnosis not present

## 2023-10-10 DIAGNOSIS — E039 Hypothyroidism, unspecified: Secondary | ICD-10-CM | POA: Insufficient documentation

## 2023-10-10 DIAGNOSIS — Z950 Presence of cardiac pacemaker: Secondary | ICD-10-CM | POA: Diagnosis present

## 2023-10-10 DIAGNOSIS — I2510 Atherosclerotic heart disease of native coronary artery without angina pectoris: Secondary | ICD-10-CM | POA: Insufficient documentation

## 2023-10-10 DIAGNOSIS — I4819 Other persistent atrial fibrillation: Secondary | ICD-10-CM | POA: Insufficient documentation

## 2023-10-10 DIAGNOSIS — Z7984 Long term (current) use of oral hypoglycemic drugs: Secondary | ICD-10-CM | POA: Insufficient documentation

## 2023-10-10 DIAGNOSIS — E1140 Type 2 diabetes mellitus with diabetic neuropathy, unspecified: Secondary | ICD-10-CM | POA: Insufficient documentation

## 2023-10-10 DIAGNOSIS — E785 Hyperlipidemia, unspecified: Secondary | ICD-10-CM | POA: Insufficient documentation

## 2023-10-10 DIAGNOSIS — Z955 Presence of coronary angioplasty implant and graft: Secondary | ICD-10-CM | POA: Diagnosis not present

## 2023-10-10 DIAGNOSIS — Z79899 Other long term (current) drug therapy: Secondary | ICD-10-CM | POA: Diagnosis not present

## 2023-10-10 DIAGNOSIS — I484 Atypical atrial flutter: Secondary | ICD-10-CM | POA: Diagnosis present

## 2023-10-10 DIAGNOSIS — I509 Heart failure, unspecified: Secondary | ICD-10-CM | POA: Diagnosis not present

## 2023-10-10 DIAGNOSIS — I081 Rheumatic disorders of both mitral and tricuspid valves: Secondary | ICD-10-CM | POA: Insufficient documentation

## 2023-10-10 DIAGNOSIS — Z8249 Family history of ischemic heart disease and other diseases of the circulatory system: Secondary | ICD-10-CM | POA: Diagnosis not present

## 2023-10-10 DIAGNOSIS — I110 Hypertensive heart disease with heart failure: Secondary | ICD-10-CM | POA: Insufficient documentation

## 2023-10-10 DIAGNOSIS — I480 Paroxysmal atrial fibrillation: Secondary | ICD-10-CM

## 2023-10-10 DIAGNOSIS — I340 Nonrheumatic mitral (valve) insufficiency: Secondary | ICD-10-CM

## 2023-10-10 DIAGNOSIS — I4892 Unspecified atrial flutter: Secondary | ICD-10-CM | POA: Diagnosis not present

## 2023-10-10 DIAGNOSIS — I4891 Unspecified atrial fibrillation: Secondary | ICD-10-CM | POA: Diagnosis not present

## 2023-10-10 HISTORY — PX: CARDIOVERSION: SHX1299

## 2023-10-10 HISTORY — PX: TRANSESOPHAGEAL ECHOCARDIOGRAM (CATH LAB): EP1270

## 2023-10-10 LAB — POCT I-STAT, CHEM 8
BUN: 26 mg/dL — ABNORMAL HIGH (ref 8–23)
Calcium, Ion: 1.18 mmol/L (ref 1.15–1.40)
Chloride: 101 mmol/L (ref 98–111)
Creatinine, Ser: 1.4 mg/dL — ABNORMAL HIGH (ref 0.44–1.00)
Glucose, Bld: 165 mg/dL — ABNORMAL HIGH (ref 70–99)
HCT: 38 % (ref 36.0–46.0)
Hemoglobin: 12.9 g/dL (ref 12.0–15.0)
Potassium: 4.5 mmol/L (ref 3.5–5.1)
Sodium: 141 mmol/L (ref 135–145)
TCO2: 28 mmol/L (ref 22–32)

## 2023-10-10 LAB — GLUCOSE, CAPILLARY: Glucose-Capillary: 166 mg/dL — ABNORMAL HIGH (ref 70–99)

## 2023-10-10 LAB — ECHO TEE

## 2023-10-10 SURGERY — CARDIOVERSION
Anesthesia: General

## 2023-10-10 MED ORDER — APIXABAN 5 MG PO TABS
ORAL_TABLET | ORAL | Status: AC
  Filled 2023-10-10: qty 1

## 2023-10-10 MED ORDER — BUTAMBEN-TETRACAINE-BENZOCAINE 2-2-14 % EX AERO
INHALATION_SPRAY | CUTANEOUS | Status: DC | PRN
  Administered 2023-10-10: 1 via TOPICAL

## 2023-10-10 MED ORDER — APIXABAN 2.5 MG PO TABS
2.5000 mg | ORAL_TABLET | Freq: Once | ORAL | Status: DC
  Filled 2023-10-10: qty 1

## 2023-10-10 MED ORDER — GLYCOPYRROLATE 0.2 MG/ML IJ SOLN
INTRAMUSCULAR | Status: DC | PRN
  Administered 2023-10-10: .2 mg via INTRAVENOUS

## 2023-10-10 MED ORDER — APIXABAN 5 MG PO TABS
5.0000 mg | ORAL_TABLET | Freq: Once | ORAL | Status: AC
  Administered 2023-10-10: 5 mg via ORAL

## 2023-10-10 MED ORDER — LIDOCAINE 2% (20 MG/ML) 5 ML SYRINGE
INTRAMUSCULAR | Status: DC | PRN
  Administered 2023-10-10: 60 mg via INTRAVENOUS

## 2023-10-10 MED ORDER — PROPOFOL 500 MG/50ML IV EMUL
INTRAVENOUS | Status: DC | PRN
  Administered 2023-10-10: 50 ug/kg/min via INTRAVENOUS

## 2023-10-10 MED ORDER — SODIUM CHLORIDE 0.9 % IV SOLN: INTRAVENOUS | Status: DC | PRN

## 2023-10-10 MED ORDER — PROPOFOL 10 MG/ML IV BOLUS
INTRAVENOUS | Status: DC | PRN
  Administered 2023-10-10: 20 mg via INTRAVENOUS
  Administered 2023-10-10 (×2): 40 mg via INTRAVENOUS

## 2023-10-10 SURGICAL SUPPLY — 1 items: PAD DEFIB RADIO PHYSIO CONN (PAD) ×2 IMPLANT

## 2023-10-10 NOTE — Transfer of Care (Signed)
Immediate Anesthesia Transfer of Care Note  Patient: Kirsten Jensen  Procedure(s) Performed: CARDIOVERSION TRANSESOPHAGEAL ECHOCARDIOGRAM  Patient Location: PACU and Cath Lab  Anesthesia Type:General  Level of Consciousness: awake  Airway & Oxygen Therapy: Patient Spontanous Breathing  Post-op Assessment: Report given to RN  Post vital signs: stable  Last Vitals:  Vitals Value Taken Time  BP    Temp    Pulse    Resp    SpO2      Last Pain:  Vitals:   10/10/23 0756  TempSrc: Temporal         Complications: No notable events documented.

## 2023-10-10 NOTE — Anesthesia Postprocedure Evaluation (Signed)
Anesthesia Post Note  Patient: Kirsten Jensen  Procedure(s) Performed: CARDIOVERSION TRANSESOPHAGEAL ECHOCARDIOGRAM     Patient location during evaluation: PACU Anesthesia Type: General Level of consciousness: awake and alert and oriented Pain management: pain level controlled Vital Signs Assessment: post-procedure vital signs reviewed and stable Respiratory status: spontaneous breathing, nonlabored ventilation and respiratory function stable Cardiovascular status: blood pressure returned to baseline and stable Postop Assessment: no apparent nausea or vomiting Anesthetic complications: no   No notable events documented.  Last Vitals:  Vitals:   10/10/23 1030 10/10/23 1040  BP: (!) 114/58 125/61  Pulse: (!) 59 60  Resp: 16 (!) 24  Temp:    SpO2: 95% 94%    Last Pain:  Vitals:   10/10/23 1024  TempSrc: Temporal  PainSc: 0-No pain                 Modest Draeger A.

## 2023-10-10 NOTE — CV Procedure (Signed)
TEE/CARDIOVERSION NOTE  TRANSESOPHAGEAL ECHOCARDIOGRAM (TEE):  Indictation: Atrial Flutter  Consent:   Informed consent was obtained prior to the procedure. The risks, benefits and alternatives for the procedure were discussed and the patient comprehended these risks.  Risks include, but are not limited to, cough, sore throat, vomiting, nausea, somnolence, esophageal and stomach trauma or perforation, bleeding, low blood pressure, aspiration, pneumonia, infection, trauma to the teeth and death.    Time Out: Verified patient identification, verified procedure, site/side was marked, verified correct patient position, special equipment/implants available, medications/allergies/relevent history reviewed, required imaging and test results available. Performed  The pacemaker was interrogated prior to the procedure and confirmed atrial flutter.  Procedure:  After a procedural time-out, the patient was given propofol per anesthesia for sedation. See their separate report for details. The patient's heart rate, blood pressure, and oxygen saturation are monitored continuously during the procedure. The oropharynx was anesthetized with topical cetacaine.  The transesophageal probe was inserted in the esophagus and stomach without difficulty and multiple views were obtained. Agitated microbubble saline contrast was not administered.  Complications:    Complications: None Patient did tolerate procedure well.  Findings:  LEFT VENTRICLE: The left ventricular wall thickness is mildly increased.  The left ventricular cavity is normal in size. Wall motion is normal.  LVEF is 60-65%.  RIGHT VENTRICLE:  The right ventricle is normal in structure and function without any thrombus or masses.  Pacer wires noted without thrombus.  LEFT ATRIUM:  The left atrium is mildly dilated in size without any thrombus or masses.  There is spontaneous echo contrast ("smoke") in the left atrium consistent with a low  flow state.  LEFT ATRIAL APPENDAGE:  The left atrial appendage is free of any thrombus or masses. The appendage has single lobes. Pulse doppler indicates moderate flow in the appendage.  ATRIAL SEPTUM:  The atrial septum appears intact and is free of thrombus and/or masses.  There is no evidence for interatrial shunting by color doppler.  RIGHT ATRIUM:  The right atrium is normal in size and function without any thrombus or masses. Pacer wires noted without thrombus.  MITRAL VALVE:  The mitral valve demonstrates posterior MAC with normal function and Mild regurgitation.  There were no vegetations or stenosis.  AORTIC VALVE:  The aortic valve is trileaflet, normal in structure and function with  no  regurgitation.  There were no vegetations or stenosis  TRICUSPID VALVE:  The tricuspid valve is normal in structure and function with Moderate regurgitation.  There were no vegetations or stenosis   PULMONIC VALVE:  The pulmonic valve is normal in structure and function with  trivial  regurgitation.  There were no vegetations or stenosis.   AORTIC ARCH, ASCENDING AND DESCENDING AORTA:  There was grade 1 Myrtis Ser et. Al, 1992) atherosclerosis of the ascending aorta, aortic arch, or proximal descending aorta.  12. PULMONARY VEINS: Anomalous pulmonary venous return was not noted.  13. PERICARDIUM: The pericardium appeared normal and non-thickened.  There is no pericardial effusion.  CARDIOVERSION:     Second Time Out: Verified patient identification, verified procedure, site/side was marked, verified correct patient position, special equipment/implants available, medications/allergies/relevent history reviewed, required imaging and test results available.  Performed  Procedure:  Patient placed on cardiac monitor, pulse oximetry, supplemental oxygen as necessary.  Sedation administered per anesthesia Pacer pads placed anterior and posterior chest. Cardioverted 1 time(s).  Cardioverted at 200J  biphasic.  Complications:  Complications: None Patient did tolerate procedure well.  Impression:  No LAA thrombus Mild LAE with smoke Successful DCCV with a single 200J Biphasic shock to AV Paced rhythm Mild MR, moderate TR LVEF 60-65%, mild LVH  Recommendations:  Continue dose appropriate Eliquis 5 mg BID. Follow-up with cardiac EP  Time Spent Directly with the Patient:  45 minutes   Chrystie Nose, MD, [facility], FACP  [place]  [facility]  Medical Director of the Advanced Lipid Disorders &  Cardiovascular Risk Reduction Clinic Diplomate of the American Board of Clinical Lipidology Attending Cardiologist  Direct Dial: [phone]  Fax: [phone]  Website:  [email or web address] 10/10/2023, 10:15 AM

## 2023-10-10 NOTE — Progress Notes (Signed)
  Echocardiogram Echocardiogram Transesophageal has been performed.  Milda Smart 10/10/2023, 10:18 AM

## 2023-10-10 NOTE — H&P (Signed)
ADMISSION HISTORY & PHYSICAL  Patient Name: Kirsten Jensen Date of Encounter: 10/10/2023 Primary Care Physician: Thana Ates, MD Cardiologist: Sherryl Manges, MD  Chief Complaint   Atrial flutter  Patient Profile   89 yo female s/p PPM, noted to be in persistent atrial flutter and referred for DCCV  HPI   This is a 89 y.o. female with a past medical history significant for PPM and AF/Flutter, seen recently by Francis Dowse, PA-C in September. At the time, she had noted a 14 lb weight gain and was though to be volume overloaded. Device interrogation showed persistent afib/flutter.  She was started on Lasix. Based on weight, age and creatinine, she was noted to be underdosed on Eliquis 2.5 mg BID.  Plan was to increase to 5 mg BID for 3 weeks and proceed with DCCV. Today she presents.  There was confusion as to whether she had her am dose of Eliquis. Once contacting [facility], we were advised she just started on Eliquis 5 mg BID on 10/24, therefore, she has not been adequately anticoagulated for 3 weeks.  PMHx   Past Medical History:  Diagnosis Date   AF (paroxysmal atrial fibrillation) (HCC) 03/25/2021   Atrial fibrillation (HCC)    Coronary artery disease    stenting x 2   Diabetes (HCC)    Dyslipidemia 04/05/2020   Hypertension    Morbid obesity (HCC)    Pacemaker    Paroxysmal atrial fibrillation (HCC) 11/06/2019   Secondary hypercoagulable state (HCC) 11/06/2019   T2DM (type 2 diabetes mellitus) (HCC) 04/05/2020   Tachycardia-bradycardia syndrome (HCC) 03/17/2020    Past Surgical History:  Procedure Laterality Date   COLONOSCOPY N/A 11/02/2020   Procedure: COLONOSCOPY;  Surgeon: Vida Rigger, MD;  Location: [facility] ENDOSCOPY;  Service: Endoscopy;  Laterality: N/A;   ESOPHAGOGASTRODUODENOSCOPY N/A 11/01/2020   Procedure: ESOPHAGOGASTRODUODENOSCOPY (EGD);  Surgeon: Vida Rigger, MD;  Location: [facility] ENDOSCOPY;  Service: Endoscopy;  Laterality: N/A;    FAMHx   Family History   Problem Relation Age of Onset   Hypertension Mother    Stroke Mother        died at age 58   CAD Father        MI at age of 12   Breast cancer Daughter    Sleep apnea Son     SOCHx    reports that she has never smoked. She has never used smokeless tobacco. She reports that she does not currently use alcohol. She reports that she does not use drugs.  Outpatient Medications   No current facility-administered medications on file prior to encounter.   Current Outpatient Medications on File Prior to Encounter  Medication Sig Dispense Refill   apixaban (ELIQUIS) 2.5 MG TABS tablet Take 2.5 mg by mouth 2 (two) times daily.     ascorbic acid (VITAMIN C) 500 MG tablet Take 1 tablet (500 mg total) by mouth daily. 30 tablet 0   buPROPion (WELLBUTRIN) 100 MG tablet Take 100 mg by mouth daily.     calcium carbonate (OSCAL) 1500 (600 Ca) MG TABS tablet Take 600 mg of elemental calcium by mouth 2 (two) times daily.     cholecalciferol (VITAMIN D3) 25 MCG (1000 UNIT) tablet Take 1,000 Units by mouth daily.     diltiazem (CARDIZEM SR) 120 MG 12 hr capsule Take 120 mg by mouth daily.     ferrous sulfate 325 (65 FE) MG tablet Take 1 tablet (325 mg total) by mouth daily with breakfast. (Patient  taking differently: Take 325 mg by mouth daily.) 30 tablet 3   fluticasone (FLONASE) 50 MCG/ACT nasal spray Place 1 spray into both nostrils daily.     furosemide (LASIX) 20 MG tablet Take 1 tablet (20 mg total) by mouth daily. 90 tablet 3   gemfibrozil (LOPID) 600 MG tablet Take 600 mg by mouth 2 (two) times daily.     HYDROcodone-acetaminophen (NORCO/VICODIN) 5-325 MG tablet Take 1 tablet by mouth every 8 (eight) hours as needed for moderate pain (pain score 4-6).     JARDIANCE 10 MG TABS tablet Take 10 mg by mouth daily.     levothyroxine (SYNTHROID) 50 MCG tablet Take 50 mcg by mouth daily before breakfast.     magnesium oxide (MAG-OX) 400 MG tablet Take 1 tablet (400 mg total) by mouth daily. 30 tablet 3    metFORMIN (GLUCOPHAGE) 500 MG tablet Take 500 mg by mouth daily.     metoprolol succinate (TOPROL XL) 25 MG 24 hr tablet Take 1 tablet (25 mg total) by mouth daily. 90 tablet 3   Multiple Vitamins-Minerals (OCUVITE ADULT 50+ PO) Take 1 capsule by mouth daily.     pantoprazole (PROTONIX) 40 MG tablet Take 1 tablet (40 mg total) by mouth 2 (two) times daily before a meal. (Patient taking differently: Take 40 mg by mouth daily.) 30 tablet 3   polyethylene glycol powder (GLYCOLAX/MIRALAX) 17 GM/SCOOP powder Take 17 g by mouth daily.     pravastatin (PRAVACHOL) 80 MG tablet Take 80 mg by mouth daily.     ramipril (ALTACE) 5 MG capsule Take 5 mg by mouth daily.     tiZANidine (ZANAFLEX) 4 MG tablet Take 4 mg by mouth every 8 (eight) hours as needed for muscle spasms.     vitamin B-12 (CYANOCOBALAMIN) 1000 MCG tablet Take 1,000 mcg by mouth daily.     acetaminophen (TYLENOL) 500 MG tablet Take 500 mg by mouth every 6 (six) hours as needed for mild pain (pain score 1-3).      Inpatient Medications    Scheduled Meds:  apixaban  2.5 mg Oral Once    Continuous Infusions:   PRN Meds:    ALLERGIES   No Known Allergies  ROS   Pertinent items noted in HPI and remainder of comprehensive ROS otherwise negative.  Vitals   Vitals:   10/10/23 0756  BP: 134/73  Pulse: 89  Resp: 19  Temp: 97.8 F (36.6 C)  TempSrc: Temporal  SpO2: 97%  Weight: 109.3 kg  Height: 5\' 7"  (1.702 m)   No intake or output data in the 24 hours ending 10/10/23 0909 Filed Weights   10/10/23 0756  Weight: 109.3 kg    Physical Exam   General appearance: alert and no distress Lungs: clear to auscultation bilaterally Heart: irregularly irregular rhythm Extremities: extremities normal, atraumatic, no cyanosis or edema A&Ox3, pleasant  Labs   Results for orders placed or performed during the hospital encounter of 10/10/23 (from the past 48 hour(s))  Glucose, capillary     Status: Abnormal   Collection  Time: 10/10/23  7:59 AM  Result Value Ref Range   Glucose-Capillary 166 (H) 70 - 99 mg/dL    Comment: Glucose reference range applies only to samples taken after fasting for at least 8 hours.    ECG   N/A  Telemetry   AFib/flutter with paced beats - Personally Reviewed  Radiology   No results found.  Cardiac Studies   N/A  Assessment   Active Problems:  Atypical atrial flutter (HCC)   Pacemaker   Plan   Noted to be in persistent afib or atypical flutter underlying pacemaker with 100% burden. Has not been adequately anticoagulated. I contacted Francis Dowse, PA-C and spoke with the patient and her son and they are agreeable to proceeding with TEE/DCCV. We did review the additional risks, benefits and alternative (3 more weeks of anticoagulation) with the patient and her son and she is agreeable to proceed.    Time Spent Directly with Patient:  I have spent a total of 25 minutes with patient reviewing hospital notes, telemetry, EKGs, labs and examining the patient as well as establishing an assessment and plan that was discussed with the patient.  > 50% of time was spent in direct patient care.   Length of Stay:  LOS: 0 days   Chrystie Nose, MD, [facility], FACP  [address]  [facility]  Medical Director of the Advanced Lipid Disorders &  Cardiovascular Risk Reduction Clinic Diplomate of the American Board of Clinical Lipidology Attending Cardiologist  Direct Dial: [phone]  Fax: [phone]  Website:  [email or web address] 10/10/2023, 9:09 AM

## 2023-10-10 NOTE — Discharge Instructions (Signed)
Electrical Cardioversion Electrical cardioversion is the delivery of a jolt of electricity to restore a normal rhythm to the heart. A rhythm that is too fast or is not regular keeps the heart from pumping well. In this procedure, sticky patches or metal paddles are placed on the chest to deliver electricity to the heart from a device. This procedure may be done in an emergency if: There is low or no blood pressure as a result of the heart rhythm. Normal rhythm must be restored as fast as possible to protect the brain and heart from further damage. It may save a life. This may also be a scheduled procedure for irregular or fast heart rhythms that are not immediately life-threatening.  What can I expect after the procedure? Your blood pressure, heart rate, breathing rate, and blood oxygen level will be monitored until you leave the hospital or clinic. Your heart rhythm will be watched to make sure it does not change. You may have some redness on the skin where the shocks were given. Over the counter cortizone cream may be helpful.  Follow these instructions at home: Do not drive for 24 hours if you were given a sedative during your procedure. Take over-the-counter and prescription medicines only as told by your health care provider. Ask your health care provider how to check your pulse. Check it often. Rest for 48 hours after the procedure or as told by your health care provider. Avoid or limit your caffeine use as told by your health care provider. Keep all follow-up visits as told by your health care provider. This is important. Contact a health care provider if: You feel like your heart is beating too quickly or your pulse is not regular. You have a serious muscle cramp that does not go away. Get help right away if: You have discomfort in your chest. You are dizzy or you feel faint. You have trouble breathing or you are short of breath. Your speech is slurred. You have trouble moving an  arm or leg on one side of your body. Your fingers or toes turn cold or blue. Summary Electrical cardioversion is the delivery of a jolt of electricity to restore a normal rhythm to the heart. This procedure may be done right away in an emergency or may be a scheduled procedure if the condition is not an emergency. Generally, this is a safe procedure. After the procedure, check your pulse often as told by your health care provider. This information is not intended to replace advice given to you by your health care provider. Make sure you discuss any questions you have with your health care provider. Document Revised: 07/02/2019 Document Reviewed: 07/02/2019 Elsevier Patient Education  2020 [employer] Inc[employer] TEE   YOU HAD AN CARDIAC PROCEDURE TODAY: Refer to the procedure report and other information in the discharge instructions given to you for any specific questions about what was found during the examination. If this information does not answer your questions, please call Dr. Verl Dicker office at [phone] to clarify.   DIET: Your first meal following the procedure should be a light meal and then it is ok to progress to your normal diet. A half-sandwich or bowl of soup is an example of a good first meal. Heavy or fried foods are harder to digest and may make you feel nauseous or bloated. Drink plenty of fluids but you should avoid alcoholic beverages for 24 hours. If you had a esophageal dilation, please see attached instructions for diet.   ACTIVITY:  Your care partner should take you home directly after the procedure. You should plan to take it easy, moving slowly for the rest of the day. You can resume normal activity the day after the procedure however YOU SHOULD NOT DRIVE, use power tools, machinery or perform tasks that involve climbing or major physical exertion for 24 hours (because of the sedation medicines used during the test).   SYMPTOMS TO REPORT IMMEDIATELY: A cardiologist can be  reached at any hour. Please call [phone] for any of the following symptoms:  Vomiting of blood or coffee ground material  New, significant abdominal pain  New, significant chest pain or pain under the shoulder blades  Painful or persistently difficult swallowing  New shortness of breath  Black, tarry-looking or red, bloody stools  FOLLOW UP:  Please also call with any specific questions about appointments or follow up tests.

## 2023-10-10 NOTE — Progress Notes (Signed)
This RN spoke with [facility] staff x3, Lowella Bandy and [place], med list reviewed, pt taking 5mg  Eliquis since 10-06-2023, on 2.5mg  Eliquis previously, Dr. Rennis Golden informed

## 2023-10-11 ENCOUNTER — Encounter (HOSPITAL_COMMUNITY): Payer: Self-pay | Admitting: Internal Medicine

## 2023-11-02 ENCOUNTER — Telehealth: Payer: Self-pay | Admitting: Internal Medicine

## 2023-11-02 NOTE — Telephone Encounter (Signed)
  Pt said she's been going through a lot recently and has started to feel a little short of breath at times. Sometimes, she feels weak and unsteady. She is not sure if this is related to her recent cardioversion. One of her friends mentioned it might be depression, but she is unsure.

## 2023-11-03 NOTE — Telephone Encounter (Signed)
Spoke with Kirsten Jensen who reports she is feeling better today.  She states nurse with her assisted living facility has done a walk test with pulse oximeter and she cannot recall numbers but she did have an increase in O2 when walking.  Kirsten Jensen also reports she has been diagnosed with depression and anxiety and this might be contributing to how she feels as well.  Kirsten Jensen advised with Hgb on the lower end of normal this could also be contributing to her feelings of weakness as well as her diagnosis of heart failure.  Kirsten Jensen believes weight is stable as she is weighed at her facility. Kirsten Jensen advised to keep appointment with Francis Dowse, PA-C scheduled for 11/08/2023 and let her facility nurse know if she develops, chest pain, increasing SOB or dizziness.  Kirsten Jensen verbalizes understanding and thanked Charity fundraiser for the call.

## 2023-11-06 NOTE — Progress Notes (Unsigned)
Cardiology Office Note Date:  11/06/2023  Patient ID:  Kirsten, Jensen 09-03-1932, MRN [id number] PCP:  Thana Ates, MD  Cardiologist/EP:  [name]    Chief Complaint:   post DCCV  History of Present Illness: Kirsten Jensen is a 89 y.o. female with history of CAD (PCI approx 2018 in [place]), AFib, flutter, tachy-brady w/PPM, HTN, DM, OSA (w/CPAP).   She saw [name] 05/05/22, no reported symptoms, [facility].  Rates poorly controlled metoprolol increased to 50mg  daily. Mildly volume OL, not inclined to be too aggressive. Deferred +/- SGLT2i to her PMD  She saw A. Tillery, PA-C 09/09/22, no reported cardiac symptoms, concerns. AF burden zero, labs updated, no changes made.  I saw her 09/06/23 She has gained about 14lbs in the last few months In the last couple weeks noted swelling/bloating and getting a bit winded. "Nothing crazy, but new" She lives in ALF and walking to the dining room or back typically easy, now by the time she gets there she is "a little SOB" No CP, palpitations or cardiac awareness though her resting HR for some time 90's, which is high for her No near syncope or syncope Reports good medication compliance No bleeding or signs of bleeding She was volume OL and started on lasix 100% AFib/flutter \, flutter waves in blanking with inaccurate burden, device programmed in effort to get better arrhythmia identification and appropriate AMS Her Eliquis under-dosed and increased. Planned for DCCV once appropriately /ac.  10/10/23 she arrived to DCCV though apparently had been a delay in starting the 5mg  dose of eliquis, and was not a full 3 weeks and in d/w the patient, she was agreeable to TEE > DCCV  Was successful in restoring SR  TODAY She feels a bit better, some days better then others Edema is resolved, breathing easier No CP, some palpitations No near syncope or syncope Lives at [facility] and they manage her meds (presumed  compliance), she does think she is getting all of her medicines every day,  Eliquis she is certain is the 5mg  dose BID No bleeding or signs of bleeding      Device information BSCi dual chamber PPM implanted 09/14/2018 Known low RV sensing amplitude   Past Medical History:  Diagnosis Date   AF (paroxysmal atrial fibrillation) (HCC) 03/25/2021   Atrial fibrillation (HCC)    Coronary artery disease    stenting x 2   Diabetes (HCC)    Dyslipidemia 04/05/2020   Hypertension    Morbid obesity (HCC)    Pacemaker    Paroxysmal atrial fibrillation (HCC) 11/06/2019   Secondary hypercoagulable state (HCC) 11/06/2019   T2DM (type 2 diabetes mellitus) (HCC) 04/05/2020   Tachycardia-bradycardia syndrome (HCC) 03/17/2020    Past Surgical History:  Procedure Laterality Date   CARDIOVERSION N/A 10/10/2023   Procedure: CARDIOVERSION;  Surgeon: Chrystie Nose, MD;  Location: [facility] INVASIVE CV LAB;  Service: Cardiovascular;  Laterality: N/A;   COLONOSCOPY N/A 11/02/2020   Procedure: COLONOSCOPY;  Surgeon: Vida Rigger, MD;  Location: [facility] ENDOSCOPY;  Service: Endoscopy;  Laterality: N/A;   ESOPHAGOGASTRODUODENOSCOPY N/A 11/01/2020   Procedure: ESOPHAGOGASTRODUODENOSCOPY (EGD);  Surgeon: Vida Rigger, MD;  Location: [facility] ENDOSCOPY;  Service: Endoscopy;  Laterality: N/A;   TRANSESOPHAGEAL ECHOCARDIOGRAM (CATH LAB)  10/10/2023   Procedure: TRANSESOPHAGEAL ECHOCARDIOGRAM;  Surgeon: Chrystie Nose, MD;  Location: [facility] INVASIVE CV LAB;  Service: Cardiovascular;;    Current Outpatient Medications  Medication Sig Dispense Refill  acetaminophen (TYLENOL) 500 MG tablet Take 500 mg by mouth every 6 (six) hours as needed for mild pain (pain score 1-3).     apixaban (ELIQUIS) 2.5 MG TABS tablet Take 2.5 mg by mouth 2 (two) times daily.     ascorbic acid (VITAMIN C) 500 MG tablet Take 1 tablet (500 mg total) by mouth daily. 30 tablet 0   buPROPion (WELLBUTRIN) 100 MG tablet Take 100 mg by mouth daily.     calcium  carbonate (OSCAL) 1500 (600 Ca) MG TABS tablet Take 600 mg of elemental calcium by mouth 2 (two) times daily.     cholecalciferol (VITAMIN D3) 25 MCG (1000 UNIT) tablet Take 1,000 Units by mouth daily.     diltiazem (CARDIZEM SR) 120 MG 12 hr capsule Take 120 mg by mouth daily.     ferrous sulfate 325 (65 FE) MG tablet Take 1 tablet (325 mg total) by mouth daily with breakfast. (Patient taking differently: Take 325 mg by mouth daily.) 30 tablet 3   fluticasone (FLONASE) 50 MCG/ACT nasal spray Place 1 spray into both nostrils daily.     furosemide (LASIX) 20 MG tablet Take 1 tablet (20 mg total) by mouth daily. 90 tablet 3   gemfibrozil (LOPID) 600 MG tablet Take 600 mg by mouth 2 (two) times daily.     HYDROcodone-acetaminophen (NORCO/VICODIN) 5-325 MG tablet Take 1 tablet by mouth every 8 (eight) hours as needed for moderate pain (pain score 4-6).     JARDIANCE 10 MG TABS tablet Take 10 mg by mouth daily.     levothyroxine (SYNTHROID) 50 MCG tablet Take 50 mcg by mouth daily before breakfast.     magnesium oxide (MAG-OX) 400 MG tablet Take 1 tablet (400 mg total) by mouth daily. 30 tablet 3   metFORMIN (GLUCOPHAGE) 500 MG tablet Take 500 mg by mouth daily.     metoprolol succinate (TOPROL XL) 25 MG 24 hr tablet Take 1 tablet (25 mg total) by mouth daily. 90 tablet 3   Multiple Vitamins-Minerals (OCUVITE ADULT 50+ PO) Take 1 capsule by mouth daily.     pantoprazole (PROTONIX) 40 MG tablet Take 1 tablet (40 mg total) by mouth 2 (two) times daily before a meal. (Patient taking differently: Take 40 mg by mouth daily.) 30 tablet 3   polyethylene glycol powder (GLYCOLAX/MIRALAX) 17 GM/SCOOP powder Take 17 g by mouth daily.     pravastatin (PRAVACHOL) 80 MG tablet Take 80 mg by mouth daily.     ramipril (ALTACE) 5 MG capsule Take 5 mg by mouth daily.     tiZANidine (ZANAFLEX) 4 MG tablet Take 4 mg by mouth every 8 (eight) hours as needed for muscle spasms.     vitamin B-12 (CYANOCOBALAMIN) 1000 MCG  tablet Take 1,000 mcg by mouth daily.     No current facility-administered medications for this visit.    Allergies:   Patient has no known allergies.   Social History:  The patient  reports that she has never smoked. She has never used smokeless tobacco. She reports that she does not currently use alcohol. She reports that she does not use drugs.   Family History:  The patient's family history includes Breast cancer in her daughter; CAD in her father; Hypertension in her mother; Sleep apnea in her son; Stroke in her mother.  ROS:  Please see the history of present illness.  All other systems are reviewed and otherwise negative.   PHYSICAL EXAM:  VS:  LMP  (LMP Unknown)  BMI: There is no height or weight on file to calculate BMI. Well nourished, well developed, in no acute distress  HEENT: normocephalic, atraumatic  Neck: no JVD, carotid bruits or masses Cardiac: irreg-irreg no significant murmurs, no rubs, or gallops Lungs:  CTA b/l, no wheezing, rhonchi or rales  Abd: soft, nontender, obese MS: no deformity, age appropriate atrophy Ext: no edema today Skin: warm and dry, no rash Neuro:  No gross deficits appreciated Psych: euthymic mood, full affect  PPM site is stable, no tethering or discomfort   EKG:  done today and reviewed by myself AFlutter 78bpm, both V paced and sensed beats  PPM interrogation done today and reviewed by myself:  Battery and lead measurements are good In AFlutter, rate controlled with intermittent V pacing Appears paroxysmal by device  No HVR episodes   10/10/23: TEE/DCCV 1. Left ventricular ejection fraction, by estimation, is 60 to 65%. The  left ventricle has normal function. The left ventricle has no regional  wall motion abnormalities.   2. Device leads well-visualized, no attached thrombus. Right ventricular  systolic function is normal. The right ventricular size is normal.   3. Left atrial size was mildly dilated. No left atrial/left  atrial  appendage thrombus was detected.   4. The mitral valve is abnormal. Mild mitral valve regurgitation.   5. Tricuspid valve regurgitation is moderate.   6. The aortic valve is tricuspid. Aortic valve regurgitation is not  visualized.   Conclusion(s)/Recommendation(s): No LA/LAA thrombus identified. Successful  cardioversion performed with restoration of normal sinus rhythm.    10/31/2020; TTE IMPRESSIONS   1. Left ventricular ejection fraction, by estimation, is 55 to 60%. The  left ventricle has normal function. The left ventricle has no regional  wall motion abnormalities. There is mild concentric left ventricular  hypertrophy. Left ventricular diastolic  function could not be evaluated.   2. Right ventricular systolic function is normal. The right ventricular  size is normal. There is moderately elevated pulmonary artery systolic  pressure. The estimated right ventricular systolic pressure is 58.8 mmHg.   3. The mitral valve is degenerative. Mild mitral valve regurgitation.   4. Tricuspid valve regurgitation is mild to moderate.   5. The aortic valve is tricuspid. There is mild calcification of the  aortic valve. There is mild thickening of the aortic valve. Aortic valve  regurgitation is not visualized. Mild aortic valve sclerosis is present,  with no evidence of aortic valve  stenosis.   6. The inferior vena cava is dilated in size with <50% respiratory  variability, suggesting right atrial pressure of 15 mmHg.   Comparison(s): Changes from prior study are noted. EF 55-60%. Moderately  elevated pulmonary pressures.    04/06/2020: TTE IMPRESSIONS  1. Left ventricular ejection fraction, by estimation, is 50%. The left  ventricle has low normal function. Left ventricular endocardial border not  optimally defined to evaluate regional wall motion. The left ventricular  internal cavity size was mildly  dilated. Left ventricular diastolic parameters are indeterminate.    2. Right ventricular systolic function is normal. The right ventricular  size is mildly enlarged. There is mildly elevated pulmonary artery  systolic pressure. The estimated right ventricular systolic pressure is  39.0 mmHg.   3. Left atrial size was mildly dilated.   4. Right atrial size was mildly dilated.   5. The mitral valve is degenerative. Mild mitral valve regurgitation. No  evidence of mitral stenosis.   6. The aortic valve is normal in structure. Aortic valve  regurgitation is  not visualized. No aortic stenosis is present.   7. The inferior vena cava is normal in size with greater than 50%  respiratory variability, suggesting right atrial pressure of 3 mmHg.     Recent Labs: 09/06/2023: Platelets 265 10/10/2023: BUN 26; Creatinine, Ser 1.40; Hemoglobin 12.9; Potassium 4.5; Sodium 141  No results found for requested labs within last 365 days.   CrCl cannot be calculated (Patient's most recent lab result is older than the maximum 21 days allowed.).   Wt Readings from Last 3 Encounters:  10/10/23 241 lb (109.3 kg)  09/06/23 254 lb 12.8 oz (115.6 kg)  02/15/23 249 lb (112.9 kg)     Other studies reviewed: Additional studies/records reviewed today include: summarized above  ASSESSMENT AND PLAN:  1. CAD     No anginal symptoms     On BB, statin and lopid, pravachol, labs monitored via her PMD  2. PPM     intact function     no programming changes made  3. Persistent AFib, flutter     CHA2DS2Vasc is 6, on Eliquis, appropriately dosed for weight/Creat     She will need AAD to maintain SR Rates are controlled I don't think she is an ideal candidate for ablation given advanced age and central obesity and an atypical flutter      Will start amiodarone 200mg  BID for 2 weeks then daily Baseline labs today See her back in 6 weeks, sooner if needed   4. HTN     Looks OK   5. HLD     Not addressed today  6. HFpEF     Compensated current;y     On BB, Jardiance,  lasix, , ACE  7. Secondary hypercoagulable state       Disposition: back in 6 weeks, sooner if needed     Current medicines are reviewed at length with the patient today.  The patient did not have any concerns regarding medicines.  Norma Fredrickson, PA-C 11/06/2023 2:22 PM     [facility] [address] [place] [address] [phone] (office)  [phone] (fax)

## 2023-11-08 ENCOUNTER — Ambulatory Visit: Payer: Medicare Other | Attending: Physician Assistant | Admitting: Physician Assistant

## 2023-11-08 ENCOUNTER — Encounter: Payer: Self-pay | Admitting: Physician Assistant

## 2023-11-08 VITALS — BP 107/63 | HR 78 | Ht 67.0 in | Wt 245.4 lb

## 2023-11-08 DIAGNOSIS — I10 Essential (primary) hypertension: Secondary | ICD-10-CM | POA: Diagnosis not present

## 2023-11-08 DIAGNOSIS — I484 Atypical atrial flutter: Secondary | ICD-10-CM | POA: Diagnosis present

## 2023-11-08 DIAGNOSIS — I5032 Chronic diastolic (congestive) heart failure: Secondary | ICD-10-CM | POA: Diagnosis present

## 2023-11-08 DIAGNOSIS — I480 Paroxysmal atrial fibrillation: Secondary | ICD-10-CM | POA: Insufficient documentation

## 2023-11-08 DIAGNOSIS — Z950 Presence of cardiac pacemaker: Secondary | ICD-10-CM | POA: Diagnosis not present

## 2023-11-08 DIAGNOSIS — D6869 Other thrombophilia: Secondary | ICD-10-CM | POA: Diagnosis present

## 2023-11-08 LAB — CUP PACEART INCLINIC DEVICE CHECK
Date Time Interrogation Session: 20241126163908
Implantable Lead Connection Status: 753985
Implantable Lead Connection Status: 753985
Implantable Lead Implant Date: 20191003
Implantable Lead Implant Date: 20191003
Implantable Lead Location: 753859
Implantable Lead Location: 753860
Implantable Lead Model: 7741
Implantable Lead Model: 7742
Implantable Lead Serial Number: 1047491
Implantable Lead Serial Number: 1074664
Implantable Pulse Generator Implant Date: 20191003
Lead Channel Impedance Value: 651 Ohm
Lead Channel Impedance Value: 730 Ohm
Lead Channel Pacing Threshold Amplitude: 0.9 V
Lead Channel Pacing Threshold Amplitude: 1.3 V
Lead Channel Pacing Threshold Pulse Width: 0.4 ms
Lead Channel Pacing Threshold Pulse Width: 0.4 ms
Lead Channel Sensing Intrinsic Amplitude: 3.3 mV
Lead Channel Sensing Intrinsic Amplitude: 5.4 mV
Lead Channel Setting Pacing Amplitude: 2.5 V
Lead Channel Setting Pacing Amplitude: 2.6 V
Lead Channel Setting Pacing Pulse Width: 0.4 ms
Lead Channel Setting Sensing Sensitivity: 1 mV
Pulse Gen Serial Number: 444544
Zone Setting Status: 755011

## 2023-11-08 MED ORDER — AMIODARONE HCL 200 MG PO TABS: 200.0000 mg | ORAL_TABLET | Freq: Every day | ORAL | 3 refills | Status: DC

## 2023-11-08 MED ORDER — AMIODARONE HCL 200 MG PO TABS: ORAL_TABLET | ORAL | 0 refills | Status: DC

## 2023-11-08 NOTE — Patient Instructions (Signed)
Medication Instructions:  Your physician has recommended you make the following change in your medication:  1.) start amiodarone 200 mg - take one tablet twice a day for 14 days, then decrease to one tablet daily  *If you need a refill on your cardiac medications before your next appointment, please call your pharmacy*   Lab Work: Today: cmet, tsh  If you have labs (blood work) drawn today and your tests are completely normal, you will receive your results only by: MyChart Message (if you have MyChart) OR A paper copy in the mail If you have any lab test that is abnormal or we need to change your treatment, we will call you to review the results.   Testing/Procedures: none   Follow-Up: At [facility], you and your health needs are our priority.  As part of our continuing mission to provide you with exceptional heart care, we have created designated Provider Care Teams.  These Care Teams include your primary Cardiologist (physician) and Advanced Practice Providers (APPs -  Physician Assistants and Nurse Practitioners) who all work together to provide you with the care you need, when you need it.   Your next appointment:   6 week(s)  Provider:   You will see one of the following Advanced Practice Providers on your designated Care Team:   Francis Dowse, [place]

## 2023-11-09 ENCOUNTER — Telehealth: Payer: Self-pay | Admitting: *Deleted

## 2023-11-09 DIAGNOSIS — I5032 Chronic diastolic (congestive) heart failure: Secondary | ICD-10-CM

## 2023-11-09 DIAGNOSIS — I10 Essential (primary) hypertension: Secondary | ICD-10-CM

## 2023-11-09 LAB — COMPREHENSIVE METABOLIC PANEL
ALT: 11 [IU]/L (ref 0–32)
AST: 36 [IU]/L (ref 0–40)
Albumin: 4.1 g/dL (ref 3.7–4.7)
Alkaline Phosphatase: 78 [IU]/L (ref 44–121)
BUN/Creatinine Ratio: 26 (ref 12–28)
BUN: 43 mg/dL — ABNORMAL HIGH (ref 8–27)
Bilirubin Total: 0.3 mg/dL (ref 0.0–1.2)
CO2: 24 mmol/L (ref 20–29)
Calcium: 9.8 mg/dL (ref 8.7–10.3)
Chloride: 94 mmol/L — ABNORMAL LOW (ref 96–106)
Creatinine, Ser: 1.66 mg/dL — ABNORMAL HIGH (ref 0.57–1.00)
Globulin, Total: 2.9 g/dL (ref 1.5–4.5)
Glucose: 153 mg/dL — ABNORMAL HIGH (ref 70–99)
Potassium: 4.7 mmol/L (ref 3.5–5.2)
Sodium: 135 mmol/L (ref 134–144)
Total Protein: 7 g/dL (ref 6.0–8.5)
eGFR: 29 mL/min/{1.73_m2} — ABNORMAL LOW (ref >59)

## 2023-11-09 LAB — TSH: TSH: 0.984 u[IU]/mL (ref 0.450–4.500)

## 2023-11-09 NOTE — Telephone Encounter (Signed)
-----   Message from Sheilah Pigeon sent at 11/09/2023  7:16 AM EST ----- Creat is up from her baseline, like with the addition of the lasix at her last visit.  Otherwise labs look OK.  Will need to monitor closely her Eliquis dose is partly based on renal function. Lets stop her ramipril encourage adequate hydration (though not excessive),please and repeat BMET in 7-10 days

## 2023-11-09 NOTE — Telephone Encounter (Signed)
Patient notified.  She requests I call [facility] [address] where she resides regarding medicine instructions.  Patient does not know phone number.  I called number listed for [facility] [phone].  I was advised to call [phone].  No answer at this number.  I called back to [phone] and this operator was not able to reach [place].  Operator took our office number and will reach out to [place] again and have them call the office. I let operator know Chip [facility]er could speak with on call provider I spoke with patient and let her know I could not reach [facility] to give instructions. Patient will make staff aware that she is to stop Ramipril and ask staff to contact our office

## 2023-11-17 ENCOUNTER — Ambulatory Visit (INDEPENDENT_AMBULATORY_CARE_PROVIDER_SITE_OTHER): Payer: Medicare Other

## 2023-11-17 ENCOUNTER — Telehealth: Payer: Self-pay | Admitting: Internal Medicine

## 2023-11-17 DIAGNOSIS — I495 Sick sinus syndrome: Secondary | ICD-10-CM | POA: Diagnosis not present

## 2023-11-17 NOTE — Telephone Encounter (Signed)
Attempted to contact patient to assist with sending manual transmission. No answer, LMTCB.

## 2023-11-17 NOTE — Telephone Encounter (Addendum)
Spoke with pt who reports an 18 pound weight gain since Monday, from 242lb to 260lb. Pt resides at an assisted living facility where she is weighed by a "med tech"  Pt denies CP, new SOB or edema.  Pt states she depends on facility to provide her medication as prescribed.  Pt advised to have facility reweigh her tomorrow morning to confirm weight and contact office if weight gain is accurate and she should also discuss with nursing or provider at facility.  Pt also advised to send a device transmission for review.  She is also to have a repeat BMET this week d/t elevated Cr on 11/26.  She should be following a low sodium, heart healthy diet and monitoring her fluid intake.  Pt verbalizes understanding and agrees with current plan.

## 2023-11-17 NOTE — Telephone Encounter (Signed)
This device does not offer fluid index options. On last transmission 11/10/23, patient was in AFL but not new and pending DCCV once [facility] was appropriate.

## 2023-11-17 NOTE — Telephone Encounter (Signed)
Remote transmission received. Normal device function. Presenting rhythm AT/VS w/ controlled rates. No ventricular arrhythmias.   Routing back to gen cards to advise further.    Marland Kitchen

## 2023-11-17 NOTE — Telephone Encounter (Signed)
Pt c/o Shortness Of Breath: STAT if SOB developed within the last 24 hours or pt is noticeably SOB on the phone  1. Are you currently SOB (can you hear that pt is SOB on the phone)?  No   2. How long have you been experiencing SOB?  Patient unsure   3. Are you SOB when sitting or when up moving around?  When up and moving around  4. Are you currently experiencing any other symptoms?   Patient states she gained least 8 lbs since Monday. Patient denies swelling.

## 2023-11-21 ENCOUNTER — Other Ambulatory Visit: Payer: Self-pay | Admitting: *Deleted

## 2023-11-21 DIAGNOSIS — I10 Essential (primary) hypertension: Secondary | ICD-10-CM

## 2023-11-21 DIAGNOSIS — I5032 Chronic diastolic (congestive) heart failure: Secondary | ICD-10-CM

## 2023-11-21 LAB — CUP PACEART REMOTE DEVICE CHECK
Battery Remaining Longevity: 18 mo
Battery Remaining Percentage: 36 %
Brady Statistic RA Percent Paced: 0 %
Brady Statistic RV Percent Paced: 35 %
Date Time Interrogation Session: 20241205162700
Implantable Lead Connection Status: 753985
Implantable Lead Connection Status: 753985
Implantable Lead Implant Date: 20191003
Implantable Lead Implant Date: 20191003
Implantable Lead Location: 753859
Implantable Lead Location: 753860
Implantable Lead Model: 7741
Implantable Lead Model: 7742
Implantable Lead Serial Number: 1047491
Implantable Lead Serial Number: 1074664
Implantable Pulse Generator Implant Date: 20191003
Lead Channel Impedance Value: 658 Ohm
Lead Channel Impedance Value: 735 Ohm
Lead Channel Pacing Threshold Amplitude: 1.4 V
Lead Channel Pacing Threshold Pulse Width: 0.4 ms
Lead Channel Setting Pacing Amplitude: 2.5 V
Lead Channel Setting Pacing Amplitude: 2.6 V
Lead Channel Setting Pacing Pulse Width: 0.4 ms
Lead Channel Setting Sensing Sensitivity: 1 mV
Pulse Gen Serial Number: 444544
Zone Setting Status: 755011

## 2023-11-22 LAB — BASIC METABOLIC PANEL
BUN/Creatinine Ratio: 17 (ref 12–28)
BUN: 30 mg/dL — ABNORMAL HIGH (ref 8–27)
CO2: 25 mmol/L (ref 20–29)
Calcium: 9.5 mg/dL (ref 8.7–10.3)
Chloride: 98 mmol/L (ref 96–106)
Creatinine, Ser: 1.75 mg/dL — ABNORMAL HIGH (ref 0.57–1.00)
Glucose: 201 mg/dL — ABNORMAL HIGH (ref 70–99)
Potassium: 4.5 mmol/L (ref 3.5–5.2)
Sodium: 139 mmol/L (ref 134–144)
eGFR: 28 mL/min/{1.73_m2} — ABNORMAL LOW (ref >59)

## 2023-11-23 ENCOUNTER — Telehealth: Payer: Self-pay | Admitting: Internal Medicine

## 2023-11-23 DIAGNOSIS — I5032 Chronic diastolic (congestive) heart failure: Secondary | ICD-10-CM

## 2023-11-23 DIAGNOSIS — I10 Essential (primary) hypertension: Secondary | ICD-10-CM

## 2023-11-23 NOTE — Telephone Encounter (Signed)
Patient was returning call. Please advise ?

## 2023-11-24 NOTE — Telephone Encounter (Signed)
Follow Up:     Patient said she was returning Renee's call from yesterday(11-13-23)

## 2023-11-24 NOTE — Telephone Encounter (Signed)
Attempted phone call to pt's Facility, [facility] to discuss patients medications, weight and symptoms.  After speaking with receptionist and asking to speak with pt's nursing staff, call transferred and message states no one available to take the call.  No voicemail to leave message.     Sheilah Pigeon, PA-C 11/23/2023  8:57 AM EST     Please confirm with patient AND [facility] staff her medicines.   They were to have stopped ramipril after her labs on 11/26.... Please also confirm weight, accuracy of weight and any symptoms???

## 2023-11-24 NOTE — Telephone Encounter (Signed)
Spoke with pt and reviewed lab results per PA.  Pt reports weight on 11/23/2023 was 244.6.  Pt denies any new symptoms.  Pt advised triage nurses have attempted to reach nursing staff at [facility] to confirm Ramipril has been stopped but we have been unable to successfully reach anyone.  Pt states she will try to confirm with med tech Ramipril has been discontinued and will let our office know.  Pt is planning to fly to [place] on 12/05/23 for a visit with family x 1 week and "wishes she felt better."  Will forward to Francis Dowse, PA-C for review and any further recommendations.  Pt thanked Charity fundraiser for the call.

## 2023-11-25 ENCOUNTER — Telehealth: Payer: Self-pay | Admitting: Internal Medicine

## 2023-11-25 NOTE — Telephone Encounter (Addendum)
Spoke to patient. Aware we have been unable to speak to someone at her facility to give these instructions. Called Chenell (resident care coordinator) per patient request. Verbal orders to stop Ramipril given to Chenell.   Will fax over order to stop as they cannot take a verbal for stopping a medication.   Faxing to: [phone] Mindi Junker - make sure to keep facility number & fax number somewhere for future reference/needs)  Fax confirmation received

## 2023-11-25 NOTE — Addendum Note (Signed)
Addended by: Franchot Gallo on: 11/25/2023 01:41 PM   Modules accepted: Orders

## 2023-11-25 NOTE — Telephone Encounter (Signed)
Follow Up:     Patient said she talked to [facility] yesterday. She said she needs to talk to [facility] again please. She said she was told to call back.

## 2023-11-25 NOTE — Telephone Encounter (Signed)
Spoke with patient and shared recommendation from Francis Dowse, PA-C:   BMET can be next week any day Monday - Wed so we can address any med changes before she leaves   Seems then weight is fairly stable Without new symptoms, SOB, swelling, would not make any further changes other then making sure she is off the ACE. Encourage hydration (adequate but not excessive) We should repeat BMET before she travels. If her Creat remains elevated we need toadjust her Eliquis again or consider alternative OAC.  Kirsten Jensen   BMET order placed. Patient verbalized understanding and states she will arrange for transportation to come in and have labs drawn next week.

## 2023-11-29 ENCOUNTER — Telehealth: Payer: Self-pay | Admitting: Internal Medicine

## 2023-11-29 LAB — BASIC METABOLIC PANEL
BUN/Creatinine Ratio: 16 (ref 12–28)
BUN: 23 mg/dL (ref 8–27)
CO2: 23 mmol/L (ref 20–29)
Calcium: 9.5 mg/dL (ref 8.7–10.3)
Chloride: 98 mmol/L (ref 96–106)
Creatinine, Ser: 1.47 mg/dL — ABNORMAL HIGH (ref 0.57–1.00)
Glucose: 201 mg/dL — ABNORMAL HIGH (ref 70–99)
Potassium: 4.7 mmol/L (ref 3.5–5.2)
Sodium: 139 mmol/L (ref 134–144)
eGFR: 34 mL/min/{1.73_m2} — ABNORMAL LOW (ref >59)

## 2023-11-29 NOTE — Telephone Encounter (Signed)
Patient states that she is returning call. Please advise.

## 2023-12-18 NOTE — Progress Notes (Deleted)
 Cardiology Office Note Date:  12/18/2023  Patient ID:  Kirsten Jensen, Kirsten Jensen Feb 05, 1932, MRN [id number] PCP:  Dwight Trula SQUIBB, MD  Cardiologist/EP:  Dr. fernande    Chief Complaint:   *** planned  6 week f/u  History of Present Illness: Kirsten Jensen is a 89 y.o. female with history of CAD (PCI approx 2018 in [place]), AFib, flutter, tachy-brady w/PPM, HTN, DM, OSA (w/CPAP).   She saw Dr. Fernande 05/05/22, no reported symptoms, [facility].  Rates poorly controlled metoprolol  increased to 50mg  daily. Mildly volume OL, not inclined to be too aggressive. Deferred +/- SGLT2i to her PMD  She saw A. Tillery, PA-C 09/09/22, no reported cardiac symptoms, concerns. AF burden zero, labs updated, no changes made.  I saw her 09/06/23 She has gained about 14lbs in the last few months In the last couple weeks noted swelling/bloating and getting a bit winded. Nothing crazy, but new She lives in ALF and walking to the dining room or back typically easy, now by the time she gets there she is a little SOB No CP, palpitations or cardiac awareness though her resting HR for some time 90's, which is high for her No near syncope or syncope Reports good medication compliance No bleeding or signs of bleeding She was volume OL and started on lasix  100% AFib/flutter \, flutter waves in blanking with inaccurate burden, device programmed in effort to get better arrhythmia identification and appropriate AMS Her Eliquis  under-dosed and increased. Planned for DCCV once appropriately /ac.  10/10/23 she arrived to DCCV though apparently had been a delay in starting the 5mg  dose of eliquis , and was not a full 3 weeks and in d/w the patient, she was agreeable to TEE > DCCV  Was successful in restoring SR  I saw her Nov 2024 She feels a bit better, some days better then others Edema is resolved, breathing easier No CP, some palpitations No near syncope or syncope Lives at [facility] and they  manage her meds (presumed compliance), she does think she is getting all of her medicines every day,  Eliquis  she is certain is the 5mg  dose BID No bleeding or signs of bleeding In an AFlutter unfortunately Felt she was not an ablation candidate (advanced age/obesity) Amiodarone  added   Labs noted AKI >> ramipril  stopped  F/u BMET Creat up further 1.75 >> advised staff ensure that her ramipril  was stopped (lives at ALF) F/u Creat down to 1.47 (her baseline probably about 1.2)  *** AF/flutter burden *** DCCV? Pace terminate?  *** amio labs *** confirm meds, no ACE, and eliquis  dose *** eliquis , 5mg  dose for weight/creat, compliant? *** repeat labs *** volume HFpEF    Device information BSCi dual chamber PPM implanted 09/14/2018 Known low RV sensing amplitude   Past Medical History:  Diagnosis Date   AF (paroxysmal atrial fibrillation) (HCC) 03/25/2021   Atrial fibrillation (HCC)    Coronary artery disease    stenting x 2   Diabetes (HCC)    Dyslipidemia 04/05/2020   Hypertension    Morbid obesity (HCC)    Pacemaker    Paroxysmal atrial fibrillation (HCC) 11/06/2019   Secondary hypercoagulable state (HCC) 11/06/2019   T2DM (type 2 diabetes mellitus) (HCC) 04/05/2020   Tachycardia-bradycardia syndrome (HCC) 03/17/2020    Past Surgical History:  Procedure Laterality Date   CARDIOVERSION N/A 10/10/2023   Procedure: CARDIOVERSION;  Surgeon: Mona Vinie BROCKS, MD;  Location: [facility] INVASIVE CV LAB;  Service: Cardiovascular;  Laterality:  N/A;   COLONOSCOPY N/A 11/02/2020   Procedure: COLONOSCOPY;  Surgeon: Rosalie Kitchens, MD;  Location: [facility] ENDOSCOPY;  Service: Endoscopy;  Laterality: N/A;   ESOPHAGOGASTRODUODENOSCOPY N/A 11/01/2020   Procedure: ESOPHAGOGASTRODUODENOSCOPY (EGD);  Surgeon: Rosalie Kitchens, MD;  Location: [facility] ENDOSCOPY;  Service: Endoscopy;  Laterality: N/A;   TRANSESOPHAGEAL ECHOCARDIOGRAM (CATH LAB)  10/10/2023   Procedure: TRANSESOPHAGEAL ECHOCARDIOGRAM;  Surgeon: Mona Vinie BROCKS, MD;  Location: [facility] INVASIVE CV LAB;  Service: Cardiovascular;;    Current Outpatient Medications  Medication Sig Dispense Refill   acetaminophen  (TYLENOL ) 500 MG tablet Take 500 mg by mouth every 6 (six) hours as needed for mild pain (pain score 1-3).     amiodarone  (PACERONE ) 200 MG tablet Take 200 mg (one tablet) by mouth twice a day for 14 days 28 tablet 0   amiodarone  (PACERONE ) 200 MG tablet Take 1 tablet (200 mg total) by mouth daily. 90 tablet 3   apixaban  (ELIQUIS ) 5 MG TABS tablet Take 5 mg by mouth 2 (two) times daily.     ascorbic acid  (VITAMIN C ) 500 MG tablet Take 1 tablet (500 mg total) by mouth daily. 30 tablet 0   buPROPion  (WELLBUTRIN ) 100 MG tablet Take 100 mg by mouth daily.     calcium  carbonate (OSCAL) 1500 (600 Ca) MG TABS tablet Take 600 mg of elemental calcium  by mouth 2 (two) times daily.     cholecalciferol  (VITAMIN D3) 25 MCG (1000 UNIT) tablet Take 1,000 Units by mouth daily.     diltiazem  (CARDIZEM  SR) 120 MG 12 hr capsule Take 120 mg by mouth 2 (two) times daily.     ferrous sulfate  325 (65 FE) MG tablet Take 1 tablet (325 mg total) by mouth daily with breakfast. (Patient taking differently: Take 325 mg by mouth daily.) 30 tablet 3   fluticasone  (FLONASE ) 50 MCG/ACT nasal spray Place 1 spray into both nostrils daily.     furosemide  (LASIX ) 20 MG tablet Take 1 tablet (20 mg total) by mouth daily. 90 tablet 3   gemfibrozil  (LOPID ) 600 MG tablet Take 600 mg by mouth 2 (two) times daily.     HYDROcodone -acetaminophen  (NORCO/VICODIN) 5-325 MG tablet Take 1 tablet by mouth every 8 (eight) hours as needed for moderate pain (pain score 4-6).     JARDIANCE 10 MG TABS tablet Take 10 mg by mouth daily.     levothyroxine  (SYNTHROID ) 50 MCG tablet Take 50 mcg by mouth daily before breakfast.     magnesium  oxide (MAG-OX) 400 MG tablet Take 1 tablet (400 mg total) by mouth daily. 30 tablet 3   metFORMIN  (GLUCOPHAGE ) 500 MG tablet Take 500 mg by mouth daily.      metoprolol  succinate (TOPROL  XL) 25 MG 24 hr tablet Take 1 tablet (25 mg total) by mouth daily. 90 tablet 3   Multiple Vitamins-Minerals (OCUVITE ADULT 50+ PO) Take 1 capsule by mouth daily.     pantoprazole  (PROTONIX ) 40 MG tablet Take 1 tablet (40 mg total) by mouth 2 (two) times daily before a meal. (Patient taking differently: Take 40 mg by mouth daily.) 30 tablet 3   polyethylene glycol powder (GLYCOLAX /MIRALAX ) 17 GM/SCOOP powder Take 17 g by mouth daily.     pravastatin  (PRAVACHOL ) 80 MG tablet Take 80 mg by mouth daily.     tiZANidine  (ZANAFLEX ) 4 MG tablet Take 4 mg by mouth every 8 (eight) hours as needed for muscle spasms.     vitamin B-12 (CYANOCOBALAMIN ) 1000 MCG tablet Take 1,000 mcg by mouth daily.  No current facility-administered medications for this visit.    Allergies:   Patient has no known allergies.   Social History:  The patient  reports that she has never smoked. She has never used smokeless tobacco. She reports that she does not currently use alcohol. She reports that she does not use drugs.   Family History:  The patient's family history includes Breast cancer in her daughter; CAD in her father; Hypertension in her mother; Sleep apnea in her son; Stroke in her mother.  ROS:  Please see the history of present illness.  All other systems are reviewed and otherwise negative.   PHYSICAL EXAM:  VS:  LMP  (LMP Unknown)  BMI: There is no height or weight on file to calculate BMI. Well nourished, well developed, in no acute distress  HEENT: normocephalic, atraumatic  Neck: no JVD, carotid bruits or masses Cardiac: *** irreg-irreg no significant murmurs, no rubs, or gallops Lungs: *** CTA b/l, no wheezing, rhonchi or rales  Abd: soft, nontender, obese MS: no deformity, age appropriate atrophy Ext: *** no edema today Skin: warm and dry, no rash Neuro:  No gross deficits appreciated Psych: euthymic mood, full affect  *** PPM site is stable, no tethering or  discomfort   EKG:  done today and reviewed by myself ***  PPM interrogation done today and reviewed by myself:  *** Battery and lead measurements are good ***   10/10/23: TEE/DCCV 1. Left ventricular ejection fraction, by estimation, is 60 to 65%. The  left ventricle has normal function. The left ventricle has no regional  wall motion abnormalities.   2. Device leads well-visualized, no attached thrombus. Right ventricular  systolic function is normal. The right ventricular size is normal.   3. Left atrial size was mildly dilated. No left atrial/left atrial  appendage thrombus was detected.   4. The mitral valve is abnormal. Mild mitral valve regurgitation.   5. Tricuspid valve regurgitation is moderate.   6. The aortic valve is tricuspid. Aortic valve regurgitation is not  visualized.   Conclusion(s)/Recommendation(s): No LA/LAA thrombus identified. Successful  cardioversion performed with restoration of normal sinus rhythm.    10/31/2020; TTE IMPRESSIONS   1. Left ventricular ejection fraction, by estimation, is 55 to 60%. The  left ventricle has normal function. The left ventricle has no regional  wall motion abnormalities. There is mild concentric left ventricular  hypertrophy. Left ventricular diastolic  function could not be evaluated.   2. Right ventricular systolic function is normal. The right ventricular  size is normal. There is moderately elevated pulmonary artery systolic  pressure. The estimated right ventricular systolic pressure is 58.8 mmHg.   3. The mitral valve is degenerative. Mild mitral valve regurgitation.   4. Tricuspid valve regurgitation is mild to moderate.   5. The aortic valve is tricuspid. There is mild calcification of the  aortic valve. There is mild thickening of the aortic valve. Aortic valve  regurgitation is not visualized. Mild aortic valve sclerosis is present,  with no evidence of aortic valve  stenosis.   6. The inferior vena cava  is dilated in size with <50% respiratory  variability, suggesting right atrial pressure of 15 mmHg.   Comparison(s): Changes from prior study are noted. EF 55-60%. Moderately  elevated pulmonary pressures.    04/06/2020: TTE IMPRESSIONS  1. Left ventricular ejection fraction, by estimation, is 50%. The left  ventricle has low normal function. Left ventricular endocardial border not  optimally defined to evaluate regional wall motion. The  left ventricular  internal cavity size was mildly  dilated. Left ventricular diastolic parameters are indeterminate.   2. Right ventricular systolic function is normal. The right ventricular  size is mildly enlarged. There is mildly elevated pulmonary artery  systolic pressure. The estimated right ventricular systolic pressure is  39.0 mmHg.   3. Left atrial size was mildly dilated.   4. Right atrial size was mildly dilated.   5. The mitral valve is degenerative. Mild mitral valve regurgitation. No  evidence of mitral stenosis.   6. The aortic valve is normal in structure. Aortic valve regurgitation is  not visualized. No aortic stenosis is present.   7. The inferior vena cava is normal in size with greater than 50%  respiratory variability, suggesting right atrial pressure of 3 mmHg.     Recent Labs: 09/06/2023: Platelets 265 10/10/2023: Hemoglobin 12.9 11/08/2023: ALT 11; TSH 0.984 11/28/2023: BUN 23; Creatinine, Ser 1.47; Potassium 4.7; Sodium 139  No results found for requested labs within last 365 days.   CrCl cannot be calculated (Unknown ideal weight.).   Wt Readings from Last 3 Encounters:  11/08/23 245 lb 6.4 oz (111.3 kg)  10/10/23 241 lb (109.3 kg)  09/06/23 254 lb 12.8 oz (115.6 kg)     Other studies reviewed: Additional studies/records reviewed today include: summarized above  ASSESSMENT AND PLAN:  1. CAD     *** No anginal symptoms     On BB, statin and lopid , pravachol , labs monitored via her PMD  2. PPM     ***  intact function     *** no programming changes made  3. Persistent AFib, flutter     CHA2DS2Vasc is 6, on Eliquis , *** appropriately dosed for weight/Creat     *** % burden     ***  4. HTN     *** Looks OK   5. HLD     Not addressed today  6. HFpEF     *** Compensated currently     On BB, Jardiance, lasix , >>> off ACE 2/2 AKI  7. Secondary hypercoagulable state       Disposition: ***      Current medicines are reviewed at length with the patient today.  The patient did not have any concerns regarding medicines.  Bonney Charlies Arthur, PA-C 12/18/2023 12:06 PM     [facility] [address] [place] [address] [phone] (office)  [phone] (fax)

## 2023-12-20 ENCOUNTER — Ambulatory Visit: Payer: Medicare Other | Admitting: Physician Assistant

## 2024-01-04 NOTE — Progress Notes (Unsigned)
  Electrophysiology Office Note:   ID:  Danishia, Oramas 01/15/1932, MRN [id number]  Primary Cardiologist: Sherryl Manges, MD Electrophysiologist: Sherryl Manges, MD  {Click to update primary MD,subspecialty MD or APP then REFRESH:1}    History of Present Illness:   Kirsten Jensen is a 89 y.o. female with h/o CAD (PCI approx 2018 in [place]), AFib, flutter, tachy-brady w/PPM, HTN, DM, OSA (w/CPAP) seen today for routine electrophysiology followup.   Has followed lately with Renee for increased fib/flutter burden. Underwent TEE/DCC in October, but was out of rhythm again at follow up. Started on amiodarone  Since last being seen in our clinic the patient reports doing ***.  she denies chest pain, palpitations, dyspnea, PND, orthopnea, nausea, vomiting, dizziness, syncope, edema, weight gain, or early satiety.   Review of systems complete and found to be negative unless listed in HPI.   EP Information / Studies Reviewed:    EKG is not ordered today. EKG from 11/08/2023 reviewed which showed AFL at 78 bpm       PPM Interrogation-  reviewed in detail today,  See PACEART report.  Arrhythmia/Device History BSCi dual chamber PPM implanted 09/14/2018 for Tachy-brady Known low RV sensing amplitude    Physical Exam:   VS:  LMP  (LMP Unknown)    Wt Readings from Last 3 Encounters:  11/08/23 245 lb 6.4 oz (111.3 kg)  10/10/23 241 lb (109.3 kg)  09/06/23 254 lb 12.8 oz (115.6 kg)     GEN: No acute distress  NECK: No JVD; No carotid bruits CARDIAC: {EPRHYTHM:28826}, no murmurs, rubs, gallops RESPIRATORY:  Clear to auscultation without rales, wheezing or rhonchi  ABDOMEN: Soft, non-tender, non-distended EXTREMITIES:  {EDEMA LEVEL:28147::"No"} edema; No deformity   ASSESSMENT AND PLAN:    Tachy-Brady syndrome s/p [employer] PPM  Normal PPM function See Pace Art report No changes today  Persistent atrial fib Persistent flutter Burden *** by device Continue Eliquis for CHA2DS2VASc of  at least 6  HTN Stable on current regimen   CAD  No s/s ischemia  {Click here to Review PMH, Prob List, Meds, Allergies, SHx, FHx  :1}   Disposition:   Follow up with {EPPROVIDERS:28135} {EPFOLLOW UP:28173}  Signed, Graciella Freer, PA-C

## 2024-01-05 ENCOUNTER — Encounter: Payer: Self-pay | Admitting: Student

## 2024-01-05 ENCOUNTER — Ambulatory Visit: Payer: Medicare Other | Attending: Student | Admitting: Student

## 2024-01-05 VITALS — BP 126/68 | HR 64 | Ht 67.0 in | Wt 235.0 lb

## 2024-01-05 DIAGNOSIS — I10 Essential (primary) hypertension: Secondary | ICD-10-CM

## 2024-01-05 DIAGNOSIS — I5032 Chronic diastolic (congestive) heart failure: Secondary | ICD-10-CM

## 2024-01-05 DIAGNOSIS — I495 Sick sinus syndrome: Secondary | ICD-10-CM | POA: Diagnosis present

## 2024-01-05 DIAGNOSIS — I2510 Atherosclerotic heart disease of native coronary artery without angina pectoris: Secondary | ICD-10-CM

## 2024-01-05 DIAGNOSIS — I480 Paroxysmal atrial fibrillation: Secondary | ICD-10-CM

## 2024-01-05 LAB — CUP PACEART INCLINIC DEVICE CHECK
Date Time Interrogation Session: 20250123103525
Implantable Lead Connection Status: 753985
Implantable Lead Connection Status: 753985
Implantable Lead Implant Date: 20191003
Implantable Lead Implant Date: 20191003
Implantable Lead Location: 753859
Implantable Lead Location: 753860
Implantable Lead Model: 7741
Implantable Lead Model: 7742
Implantable Lead Serial Number: 1047491
Implantable Lead Serial Number: 1074664
Implantable Pulse Generator Implant Date: 20191003
Lead Channel Impedance Value: 634 Ohm
Lead Channel Impedance Value: 731 Ohm
Lead Channel Pacing Threshold Amplitude: 0.9 V
Lead Channel Pacing Threshold Amplitude: 1.5 V
Lead Channel Pacing Threshold Pulse Width: 0.4 ms
Lead Channel Pacing Threshold Pulse Width: 0.4 ms
Lead Channel Setting Pacing Amplitude: 2.5 V
Lead Channel Setting Pacing Amplitude: 2.6 V
Lead Channel Setting Pacing Pulse Width: 0.4 ms
Lead Channel Setting Sensing Sensitivity: 1 mV
Pulse Gen Serial Number: 444544
Zone Setting Status: 755011

## 2024-01-05 LAB — COMPREHENSIVE METABOLIC PANEL
ALT: 10 [IU]/L (ref 0–32)
AST: 36 [IU]/L (ref 0–40)
Albumin: 3.8 g/dL (ref 3.7–4.7)
Alkaline Phosphatase: 92 [IU]/L (ref 44–121)
BUN/Creatinine Ratio: 17 (ref 12–28)
BUN: 28 mg/dL — ABNORMAL HIGH (ref 8–27)
Bilirubin Total: 0.4 mg/dL (ref 0.0–1.2)
CO2: 26 mmol/L (ref 20–29)
Calcium: 10.1 mg/dL (ref 8.7–10.3)
Chloride: 97 mmol/L (ref 96–106)
Creatinine, Ser: 1.63 mg/dL — ABNORMAL HIGH (ref 0.57–1.00)
Globulin, Total: 3.4 g/dL (ref 1.5–4.5)
Glucose: 159 mg/dL — ABNORMAL HIGH (ref 70–99)
Potassium: 4.5 mmol/L (ref 3.5–5.2)
Sodium: 139 mmol/L (ref 134–144)
Total Protein: 7.2 g/dL (ref 6.0–8.5)
eGFR: 30 mL/min/{1.73_m2} — ABNORMAL LOW (ref >59)

## 2024-01-05 LAB — TSH: TSH: 2.36 u[IU]/mL (ref 0.450–4.500)

## 2024-01-05 LAB — T4, FREE: Free T4: 1.39 ng/dL (ref 0.82–1.77)

## 2024-01-05 NOTE — Patient Instructions (Signed)
Medication Instructions:  Your physician recommends that you continue on your current medications as directed. Please refer to the Current Medication list given to you today.  *If you need a refill on your cardiac medications before your next appointment, please call your pharmacy*  Lab Work: CMET, TSH, FreeT4-TODAY If you have labs (blood work) drawn today and your tests are completely normal, you will receive your results only by: MyChart Message (if you have MyChart) OR A paper copy in the mail If you have any lab test that is abnormal or we need to change your treatment, we will call you to review the results.  Follow-Up: At [facility], you and your health needs are our priority.  As part of our continuing mission to provide you with exceptional heart care, we have created designated Provider Care Teams.  These Care Teams include your primary Cardiologist (physician) and Advanced Practice Providers (APPs -  Physician Assistants and Nurse Practitioners) who all work together to provide you with the care you need, when you need it.  Your next appointment:   6 month(s)  Provider:   Sherryl Manges, MD

## 2024-02-24 ENCOUNTER — Telehealth: Payer: Self-pay | Admitting: Internal Medicine

## 2024-02-24 NOTE — Telephone Encounter (Signed)
 [facility] called pt for missed transmission. Called pt and she was at dinner. Will send manual tonight when she gets home.

## 2024-02-24 NOTE — Telephone Encounter (Signed)
 Patient stated she was returning PA Renee's call

## 2024-02-28 ENCOUNTER — Ambulatory Visit (INDEPENDENT_AMBULATORY_CARE_PROVIDER_SITE_OTHER)

## 2024-02-28 DIAGNOSIS — I495 Sick sinus syndrome: Secondary | ICD-10-CM | POA: Diagnosis not present

## 2024-02-29 ENCOUNTER — Telehealth: Payer: Self-pay | Admitting: Internal Medicine

## 2024-02-29 DIAGNOSIS — I480 Paroxysmal atrial fibrillation: Secondary | ICD-10-CM

## 2024-02-29 DIAGNOSIS — I484 Atypical atrial flutter: Secondary | ICD-10-CM

## 2024-02-29 LAB — CUP PACEART REMOTE DEVICE CHECK
Battery Remaining Longevity: 24 mo
Battery Remaining Percentage: 40 %
Brady Statistic RA Percent Paced: 100 %
Brady Statistic RV Percent Paced: 100 %
Date Time Interrogation Session: 20250318072600
Implantable Lead Connection Status: 753985
Implantable Lead Connection Status: 753985
Implantable Lead Implant Date: 20191003
Implantable Lead Implant Date: 20191003
Implantable Lead Location: 753859
Implantable Lead Location: 753860
Implantable Lead Model: 7741
Implantable Lead Model: 7742
Implantable Lead Serial Number: 1047491
Implantable Lead Serial Number: 1074664
Implantable Pulse Generator Implant Date: 20191003
Lead Channel Impedance Value: 611 Ohm
Lead Channel Impedance Value: 644 Ohm
Lead Channel Pacing Threshold Amplitude: 2.2 V
Lead Channel Pacing Threshold Pulse Width: 0.4 ms
Lead Channel Setting Pacing Amplitude: 2.5 V
Lead Channel Setting Pacing Amplitude: 2.6 V
Lead Channel Setting Pacing Pulse Width: 0.4 ms
Lead Channel Setting Sensing Sensitivity: 1 mV
Pulse Gen Serial Number: 444544
Zone Setting Status: 755011

## 2024-02-29 NOTE — Telephone Encounter (Signed)
 Attempted phone call to advise of Dr Odessa Fleming recommendations as below.  Left voicemail message to contact office at [phone].

## 2024-02-29 NOTE — Telephone Encounter (Signed)
 Called to say the patient has gain 5lbs in 4 days and sob on exertion.   Pt c/o medication issue:  1. Name of Medication: urosemide (LASIX) 20 MG tablet (Expired)   2. How are you currently taking this medication (dosage and times per day)?    3. Are you having a reaction (difficulty breathing--STAT)?  no  4. What is your medication issue? Calling to see in medication can be increase to 40mg . Please advise

## 2024-02-29 NOTE — Telephone Encounter (Signed)
 [facility]) [phone]   Called to report that the pt had a lot of sodium intake recently due to [facility] Patrick's Day and today she is more swollen than usual.. her lower extremities and not able to get down her normal hallway without going out due to SOB.... he is asking if they can get an order for increased  for a few days to see if they can get her relief.   Will send to Dr Graciela Husbands.

## 2024-02-29 NOTE — Telephone Encounter (Signed)
 We can resume furosemide 40 mg daily x 3 days Then continue 20 mg daily Will need BMET in 2 weeks please

## 2024-03-01 ENCOUNTER — Other Ambulatory Visit: Payer: Self-pay

## 2024-03-01 ENCOUNTER — Telehealth: Payer: Self-pay | Admitting: Internal Medicine

## 2024-03-01 DIAGNOSIS — I484 Atypical atrial flutter: Secondary | ICD-10-CM

## 2024-03-01 DIAGNOSIS — I480 Paroxysmal atrial fibrillation: Secondary | ICD-10-CM

## 2024-03-01 MED ORDER — FUROSEMIDE 20 MG PO TABS: ORAL_TABLET | ORAL | 3 refills | Status: DC

## 2024-03-01 NOTE — Telephone Encounter (Signed)
 Remote transmission received and shows normal device function. PAtient is in NSR and no events noted. Called patient to advise and advised I will send to triage to follow up further.

## 2024-03-01 NOTE — Telephone Encounter (Signed)
 Spoke with Sherri [facility]) sending in updated prescription and ordered repeat BMET. Sherri noted that she will ensure labs to be completed on 4/3 in facility.     Duke Salvia, MD  Physician Cardiology   Telephone Encounter Signed   Creation Time: 02/29/2024  5:43 PM   Signed     We can resume furosemide 40 mg daily x 3 days Then continue 20 mg daily Will need BMET in 2 weeks please

## 2024-03-01 NOTE — Telephone Encounter (Signed)
 Mrs. Annitta Jersey was on the line returning phone call regarding patient

## 2024-03-01 NOTE — Telephone Encounter (Signed)
 Spoke with patient and she is aware Sherri called yesterday about her swelling and salt intake. [name] gave new orders and Brayton Caves RN spoke with Roanna Raider today at [facility] to give provider recommendations. Please see encounter from 3/19.   She also would like to know if Device received her transmission.

## 2024-03-01 NOTE — Telephone Encounter (Signed)
 Pt c/o Shortness Of Breath: STAT if SOB developed within the last 24 hours or pt is noticeably SOB on the phone  1. Are you currently SOB (can you hear that pt is SOB on the phone)?  No   2. How long have you been experiencing SOB?  About 3 weeks, becoming gradually worse  3. Are you SOB when sitting or when up moving around?  When up and moving  4. Are you currently experiencing any other symptoms?   Weight gain, but she doesn't have any readings, she just says she gained 5 lbs since Monday. States she just doesn't feel good overall. She's normally pretty mobile, but now she gets SOB just walking from room to room. She initially called in to inform device clinic she plugged in her PPM + to see if transmission was received, but then she started talking about her symptoms.

## 2024-03-02 ENCOUNTER — Telehealth: Payer: Self-pay | Admitting: Internal Medicine

## 2024-03-02 NOTE — Telephone Encounter (Signed)
 Pt c/o medication issue:  1. Name of Medication: apixaban (ELIQUIS) 5 MG TABS tablet   amiodarone (PACERONE) 200 MG tablet    2. How are you currently taking this medication (dosage and times per day)?  Take 5 mg by mouth 2 (two) times daily.     Take 200 mg (one tablet) by mouth twice a day for 14 daysPatient not taking: Reported on 01/05/2024      3. Are you having a reaction (difficulty breathing--STAT)? No  4. What is your medication issue? Pt is requesting a callback regarding concerns with these 2 medications. Please advise

## 2024-03-02 NOTE — Telephone Encounter (Signed)
 Spoke with patient and she states she went to see her PCP yesterday because she had a bump or pimple on her arm. PCP is treating it as a spider bite. Patient states her vision therapist informed her another patient had the same thing and it was from her Eliquis. Being that patient is blind and she can't see the area. She states its hard but does feel like its getting smaller. She wants to be seen because she thinks its from her Eliquis per her vision therapist. She would like to know what [name] thinks.

## 2024-03-05 ENCOUNTER — Telehealth: Payer: Self-pay | Admitting: Internal Medicine

## 2024-03-05 NOTE — Telephone Encounter (Signed)
 Pt c/o swelling/edema: STAT if pt has developed SOB within 24 hours  If swelling, where is the swelling located? Both legs and abdomen   How much weight have you gained and in what time span? 3/3 - 227 lbs -  3/24 - 248.4 lbs  Have you gained 2 pounds in a day or 5 pounds in a week? Yes   Do you have a log of your daily weights (if so, list)? Yes   Are you currently taking a fluid pill? Yes   Are you currently SOB? Yes   Have you traveled recently in a car or plane for an extended period of time? No

## 2024-03-05 NOTE — Telephone Encounter (Signed)
 Spoke with Cheral Almas and advised to monitor weight daily and call for 2-3 pound weight gain overnight or 5 pounds in a week.  Pt is due for labs (BMET) on or around 03/13/2024.  Sherry verbalizes understanding and thanked Charity fundraiser for the call.

## 2024-03-05 NOTE — Telephone Encounter (Signed)
 Spoke with Cordelia Pen at [facility], pt's facility and scheduled appointment for 03/21/2024 with Gaye Alken.  She reports area/bruise on pt's arm is healing without problems but pt is requesting appt due to wanting to discuss Eliquis and increasing SOB.  Sherry verbalizes understanding and thanked Charity fundraiser for the call.

## 2024-03-05 NOTE — Telephone Encounter (Signed)
 Patient reports that she just finished her three days of extra lasix yesterday. She reports that she did lose about 6 pounds. She reports that she is still having swelling in her legs and shortness of breath with exertion. She is elevating her legs when she is able. She states that she has been having more trouble getting around lately and is having to use her rollator and take more frequent breaks. She is watching her sodium intake. She states that over [place] day she did not eat good and thinks that this is when everything started to worsen. She is now working on adhering to the proper diet.  She would like to know if she should take extra lasix for a couple more days.  She has an appointment with Renee on 4/7.

## 2024-03-14 ENCOUNTER — Telehealth: Payer: Self-pay | Admitting: Internal Medicine

## 2024-03-14 NOTE — Progress Notes (Addendum)
 Cardiology Office Note Date:  03/14/2024  Patient ID:  Kirsten Jensen, Kirsten Jensen 20-Oct-1932, MRN [id number] PCP:  Thana Ates, MD  Cardiologist/EP:  [name]    Chief Complaint:   SOB, bruising  History of Present Illness: Kirsten Jensen is a 89 y.o. female with history of  CAD (PCI approx 2018 in [place]),  AFib, flutter, tachy-brady w/PPM,  HTN, DM, OSA (w/CPAP).   She saw [name] 05/05/22, no reported symptoms, [facility] in [facility].  Rates poorly controlled metoprolol increased to 50mg  daily. Mildly volume OL, not inclined to be too aggressive. Deferred +/- SGLT2i to her PMD  Seeing EP APPs since then  I saw her Nov 2024, started on amiodarone, not felt an ablation candidate  She saw Mardelle Matte most recently 01/05/24, working with PT and happy with her progress with them, reported though feeling about the same, AF burden 36%, maintained on amiodarone > planned for labs  3/20 patient/[facility] staff reached out with SOB > device transmission without abnormalities/findings to explain her SOB> given a few days of pulsed lasix f/u call without improvement  TODAY  She mentions a few things  Hemorrhoidal bleeding, has been significant intermittently, has been terrified to tell anyone worried would prompt a procedure Though also says in the last week or so, none further  2.    Tired, no energy 3.     DOE         No rest SOB, no positional or nocturnal SOB, but her exertional capacity steadily declining  Sometimes she gets so winded feels lightheaded Thankfully no near syncope or syncope, no falls  4.     Saw her nephrologist who said she was going to make two medication changes, but as far as she known, not formally sent to her ALF, no med changes have been made on their end at least          She had labs done, that prompted them to make sure she was going to be seen by [place] soon, feeling like the labs suggested a cardiac source of her SOB  5.      Had a large bruise L  posterior arm, known obvious or known trauma  No CP, palpitations No syncope   03/14/24: NT BNP 4697 BUN/Creat 30/1.66 K+ 4.4 Hgb 8.3   Device information BSCi dual chamber PPM implanted 09/14/2018 Known low RV sensing amplitude  AAD hx Amiodarone started 11/08/23 for Afib   Past Medical History:  Diagnosis Date   AF (paroxysmal atrial fibrillation) (HCC) 03/25/2021   Atrial fibrillation (HCC)    Coronary artery disease    stenting x 2   Diabetes (HCC)    Dyslipidemia 04/05/2020   Hypertension    Morbid obesity (HCC)    Pacemaker    Paroxysmal atrial fibrillation (HCC) 11/06/2019   Secondary hypercoagulable state (HCC) 11/06/2019   T2DM (type 2 diabetes mellitus) (HCC) 04/05/2020   Tachycardia-bradycardia syndrome (HCC) 03/17/2020    Past Surgical History:  Procedure Laterality Date   CARDIOVERSION N/A 10/10/2023   Procedure: CARDIOVERSION;  Surgeon: Chrystie Nose, MD;  Location: [facility] INVASIVE CV LAB;  Service: Cardiovascular;  Laterality: N/A;   COLONOSCOPY N/A 11/02/2020   Procedure: COLONOSCOPY;  Surgeon: Vida Rigger, MD;  Location: [facility] ENDOSCOPY;  Service: Endoscopy;  Laterality: N/A;   ESOPHAGOGASTRODUODENOSCOPY N/A 11/01/2020   Procedure: ESOPHAGOGASTRODUODENOSCOPY (EGD);  Surgeon: Vida Rigger, MD;  Location: [facility] ENDOSCOPY;  Service: Endoscopy;  Laterality: N/A;   TRANSESOPHAGEAL ECHOCARDIOGRAM (CATH  LAB)  10/10/2023   Procedure: TRANSESOPHAGEAL ECHOCARDIOGRAM;  Surgeon: Chrystie Nose, MD;  Location: [facility] INVASIVE CV LAB;  Service: Cardiovascular;;    Current Outpatient Medications  Medication Sig Dispense Refill   acetaminophen (TYLENOL) 500 MG tablet Take 500 mg by mouth every 6 (six) hours as needed for mild pain (pain score 1-3).     amiodarone (PACERONE) 200 MG tablet Take 200 mg (one tablet) by mouth twice a day for 14 days (Patient not taking: Reported on 01/05/2024) 28 tablet 0   amiodarone (PACERONE) 200 MG tablet Take 1 tablet (200 mg total) by mouth  daily. 90 tablet 3   apixaban (ELIQUIS) 5 MG TABS tablet Take 5 mg by mouth 2 (two) times daily.     ascorbic acid (VITAMIN C) 500 MG tablet Take 1 tablet (500 mg total) by mouth daily. 30 tablet 0   buPROPion (WELLBUTRIN) 100 MG tablet Take 100 mg by mouth daily.     calcium carbonate (OSCAL) 1500 (600 Ca) MG TABS tablet Take 600 mg of elemental calcium by mouth 2 (two) times daily.     cholecalciferol (VITAMIN D3) 25 MCG (1000 UNIT) tablet Take 1,000 Units by mouth daily.     diltiazem (CARDIZEM SR) 120 MG 12 hr capsule Take 120 mg by mouth 2 (two) times daily.     ferrous sulfate 325 (65 FE) MG tablet Take 1 tablet (325 mg total) by mouth daily with breakfast. (Patient taking differently: Take 325 mg by mouth daily.) 30 tablet 3   fluticasone (FLONASE) 50 MCG/ACT nasal spray Place 1 spray into both nostrils daily.     furosemide (LASIX) 20 MG tablet Take 2 tablets (40 mg) by mouth once daily for 3 days, then decrease to 1 tablet once daily thereafter 90 tablet 3   gemfibrozil (LOPID) 600 MG tablet Take 600 mg by mouth 2 (two) times daily.     HYDROcodone-acetaminophen (NORCO/VICODIN) 5-325 MG tablet Take 1 tablet by mouth every 8 (eight) hours as needed for moderate pain (pain score 4-6).     JARDIANCE 10 MG TABS tablet Take 10 mg by mouth daily.     levothyroxine (SYNTHROID) 50 MCG tablet Take 50 mcg by mouth daily before breakfast.     magnesium oxide (MAG-OX) 400 MG tablet Take 1 tablet (400 mg total) by mouth daily. 30 tablet 3   metFORMIN (GLUCOPHAGE) 500 MG tablet Take 500 mg by mouth daily.     metoprolol succinate (TOPROL XL) 25 MG 24 hr tablet Take 1 tablet (25 mg total) by mouth daily. 90 tablet 3   Multiple Vitamins-Minerals (OCUVITE ADULT 50+ PO) Take 1 capsule by mouth daily.     pantoprazole (PROTONIX) 40 MG tablet Take 1 tablet (40 mg total) by mouth 2 (two) times daily before a meal. (Patient taking differently: Take 40 mg by mouth daily.) 30 tablet 3   polyethylene glycol  powder (GLYCOLAX/MIRALAX) 17 GM/SCOOP powder Take 17 g by mouth daily.     pravastatin (PRAVACHOL) 80 MG tablet Take 80 mg by mouth daily.     tiZANidine (ZANAFLEX) 4 MG tablet Take 4 mg by mouth every 8 (eight) hours as needed for muscle spasms.     vitamin B-12 (CYANOCOBALAMIN) 1000 MCG tablet Take 1,000 mcg by mouth daily.     No current facility-administered medications for this visit.    Allergies:   Patient has no known allergies.   Social History:  The patient  reports that she has never smoked. She has never  used smokeless tobacco. She reports that she does not currently use alcohol. She reports that she does not use drugs.   Family History:  The patient's family history includes Breast cancer in her daughter; CAD in her father; Hypertension in her mother; Sleep apnea in her son; Stroke in her mother.  ROS:  Please see the history of present illness.  All other systems are reviewed and otherwise negative.   PHYSICAL EXAM:  VS:  LMP  (LMP Unknown)  BMI: There is no height or weight on file to calculate BMI. Well nourished, well developed, in no acute distress  HEENT: normocephalic, atraumatic  Neck: no JVD, carotid bruits or masses Cardiac: RRR  no significant murmurs, no rubs, or gallops Lungs: CTA b/l, no wheezing, rhonchi or rales  Abd: soft, nontender, obese MS: no deformity, age appropriate atrophy Ext: 1+ edema to midshin b/l today LEU has mild ecchymosis posterior upper arm > small firm hemataoma though they both report much improved Skin: warm and dry, no rash Neuro:  No gross deficits appreciated Psych: euthymic mood, full affect  PPM site is stable, no tethering or discomfort   EKG:  not done today  PPM interrogation done today and reviewed by myself:  Battery and lead measurements are good No AFib, no arrythmias She is dependent in both A snd V today at 40,  I increased her RV lead output to 3.5V  for better safety margin (Last check 1.5/0.4 today  1.9/0.4)   10/10/23: TEE/DCCV 1. Left ventricular ejection fraction, by estimation, is 60 to 65%. The  left ventricle has normal function. The left ventricle has no regional  wall motion abnormalities.   2. Device leads well-visualized, no attached thrombus. Right ventricular  systolic function is normal. The right ventricular size is normal.   3. Left atrial size was mildly dilated. No left atrial/left atrial  appendage thrombus was detected.   4. The mitral valve is abnormal. Mild mitral valve regurgitation.   5. Tricuspid valve regurgitation is moderate.   6. The aortic valve is tricuspid. Aortic valve regurgitation is not  visualized.   Conclusion(s)/Recommendation(s): No LA/LAA thrombus identified. Successful  cardioversion performed with restoration of normal sinus rhythm.    10/31/2020; TTE IMPRESSIONS   1. Left ventricular ejection fraction, by estimation, is 55 to 60%. The  left ventricle has normal function. The left ventricle has no regional  wall motion abnormalities. There is mild concentric left ventricular  hypertrophy. Left ventricular diastolic  function could not be evaluated.   2. Right ventricular systolic function is normal. The right ventricular  size is normal. There is moderately elevated pulmonary artery systolic  pressure. The estimated right ventricular systolic pressure is 58.8 mmHg.   3. The mitral valve is degenerative. Mild mitral valve regurgitation.   4. Tricuspid valve regurgitation is mild to moderate.   5. The aortic valve is tricuspid. There is mild calcification of the  aortic valve. There is mild thickening of the aortic valve. Aortic valve  regurgitation is not visualized. Mild aortic valve sclerosis is present,  with no evidence of aortic valve  stenosis.   6. The inferior vena cava is dilated in size with <50% respiratory  variability, suggesting right atrial pressure of 15 mmHg.   Comparison(s): Changes from prior study are noted. EF  55-60%. Moderately  elevated pulmonary pressures.    04/06/2020: TTE IMPRESSIONS  1. Left ventricular ejection fraction, by estimation, is 50%. The left  ventricle has low normal function. Left ventricular endocardial border  not  optimally defined to evaluate regional wall motion. The left ventricular  internal cavity size was mildly  dilated. Left ventricular diastolic parameters are indeterminate.   2. Right ventricular systolic function is normal. The right ventricular  size is mildly enlarged. There is mildly elevated pulmonary artery  systolic pressure. The estimated right ventricular systolic pressure is  39.0 mmHg.   3. Left atrial size was mildly dilated.   4. Right atrial size was mildly dilated.   5. The mitral valve is degenerative. Mild mitral valve regurgitation. No  evidence of mitral stenosis.   6. The aortic valve is normal in structure. Aortic valve regurgitation is  not visualized. No aortic stenosis is present.   7. The inferior vena cava is normal in size with greater than 50%  respiratory variability, suggesting right atrial pressure of 3 mmHg.     Recent Labs: 09/06/2023: Platelets 265 10/10/2023: Hemoglobin 12.9 01/05/2024: ALT 10; BUN 28; Creatinine, Ser 1.63; Potassium 4.5; Sodium 139; TSH 2.360  No results found for requested labs within last 365 days.   CrCl cannot be calculated (Patient's most recent lab result is older than the maximum 21 days allowed.).   Wt Readings from Last 3 Encounters:  01/05/24 235 lb (106.6 kg)  11/08/23 245 lb 6.4 oz (111.3 kg)  10/10/23 241 lb (109.3 kg)     Other studies reviewed: Additional studies/records reviewed today include: summarized above  ASSESSMENT AND PLAN:  1. CAD     No anginal symptoms     On BB, statin and lopid, pravachol, labs monitored via her PMD  2. PPM     intact function     no programming changes made  3. Persistent AFib, flutter     CHA2DS2Vasc is 6, on Eliquis,  Creat 1.66 > given  age will reduce her Eliquis to 2.5mg  BID      Amiodarone     zero % burden     Jan LFTs/TSH ok  4. HTN     Looks OK, perhaps a little low for her     Reduce dilt to 120mg  daily  5. HFpEF     Lungs are clear, + LE edema     On BB, Jardiance, lasix     Off ACE suspect 2/2 CKD  BNP was 4697 Lasix 40mg  daily for 4 days then resume daily CXR pa/lat  6. Secondary hypercoagulable state  I think her SOB is likely multifactorial Hbg in Oct 2024 was 12.9 On 03/14/24 was 8.3 She reports hemorrhoidal bleeding that at least for some time was significant None in a week  She doesn't know the med changes Dr. Ervin Knack made, but doesn't think it has been put into effect yet at the SNF I will reach out to her as well as her PMD  Particularly abut her anemia > plan Eliquis? Should she get a transfusion?  ADDEND: Reached out to nephrology APP (who communicate with Dr. Kathrene Bongo) Agreed with lasix plan for today (in response to her BNP) I will reach out to her PMD for anemia evaluation and management.  ADDEND: 03/20/24 Spoke to Dr. Margaretann Loveless  She sees the patient next week, will follow up on hemorrhoidal bleeding, anemia, and +/- hold on her eliquis Francis Dowse, PA-C        Disposition: back in 3 weeks, sooner if needed     Current medicines are reviewed at length with the patient today.  The patient did not have any concerns regarding medicines.  Signed, Francis Dowse,  PA-C 03/14/2024 11:59 AM     [facility] [address] [place] [address] [phone] (office)  [phone] (fax)

## 2024-03-14 NOTE — Telephone Encounter (Signed)
 Patient called in in regards to a complaint, phone call sent to me for supervisor call. See phone note from 03/24, patient called in today upset about her conversation with Demetris, LPN on 24/40. She states that she has been a patient of [name] and Luster Landsberg for 5 years and is comfortable talking to them and Mindi Junker. When she spoke to [facility] she requested to speak with Luster Landsberg or Mindi Junker but was told she could not do this. Patient just now calling in because she did not like how this was handled. I advised her that this is our process. A phone note was sent to triage, Demetris spoke to her and then routed the message to her primary doctor and nurse. I stated that this is the way things are done because a lot of times our providers and nurses are either not in clinic or are in clinic with patients and cannot stop what they are doing to take a phone call. Mindi Junker did speak with the RN at [facility] where the patient resides and got the patient an appt for 04/09 with Canary Brim but then was rescheduled for 04/07 with Renee. Patient also states that she has a problem with having to wait 3 weeks for her appointment when she felt like she was dying. Nothing in her conversation on 03/24 stated that she felt like she was dying. She wants to speak with a clinical supervisor in regards to this process on the clinical side. Advised I would let the clinical supervisor aware.

## 2024-03-16 ENCOUNTER — Telehealth: Payer: Self-pay | Admitting: Internal Medicine

## 2024-03-16 NOTE — Telephone Encounter (Signed)
 Spoke with pt and the pt had stated that Dr. Kathrene Bongo had made some medication changes and their office was supposed to be faxing our office the medication changes. Pt can't remember the names of the medications that were changed but states she tried to call Dr. Jon Gills office to see if they sent the fax yet and she couldn't get in touch with anyone. Explained to pt that I don't see anything noted in the chart on my end but I would forward to Dr. Odessa Fleming nurse to review as well. Pt verbalized understanding of plan.

## 2024-03-16 NOTE — Telephone Encounter (Signed)
 Pt is calling for [place] to send order to [facility] for medication change by her Kidney Doctor but pt doesn't remember the name of medication

## 2024-03-16 NOTE — Telephone Encounter (Signed)
 Pt is scheduled to see Francis Dowse on 03/19/2024.  Assisted Living Facility should provide medication list for that appointment.

## 2024-03-19 ENCOUNTER — Ambulatory Visit
Admission: RE | Admit: 2024-03-19 | Discharge: 2024-03-19 | Disposition: A | Discharge status: Home / Self Care | Source: Ambulatory Visit | Attending: Physician Assistant

## 2024-03-19 ENCOUNTER — Encounter: Payer: Self-pay | Admitting: Physician Assistant

## 2024-03-19 ENCOUNTER — Ambulatory Visit: Attending: Physician Assistant | Admitting: Physician Assistant

## 2024-03-19 VITALS — BP 110/50 | HR 60 | Ht 67.0 in | Wt 252.8 lb

## 2024-03-19 DIAGNOSIS — I4892 Unspecified atrial flutter: Secondary | ICD-10-CM | POA: Insufficient documentation

## 2024-03-19 DIAGNOSIS — D6869 Other thrombophilia: Secondary | ICD-10-CM | POA: Diagnosis not present

## 2024-03-19 DIAGNOSIS — R0600 Dyspnea, unspecified: Secondary | ICD-10-CM | POA: Insufficient documentation

## 2024-03-19 DIAGNOSIS — Z950 Presence of cardiac pacemaker: Secondary | ICD-10-CM | POA: Insufficient documentation

## 2024-03-19 DIAGNOSIS — I5032 Chronic diastolic (congestive) heart failure: Secondary | ICD-10-CM | POA: Diagnosis present

## 2024-03-19 DIAGNOSIS — I2510 Atherosclerotic heart disease of native coronary artery without angina pectoris: Secondary | ICD-10-CM | POA: Insufficient documentation

## 2024-03-19 LAB — CUP PACEART INCLINIC DEVICE CHECK
Date Time Interrogation Session: 20250407132932
Implantable Lead Connection Status: 753985
Implantable Lead Connection Status: 753985
Implantable Lead Implant Date: 20191003
Implantable Lead Implant Date: 20191003
Implantable Lead Location: 753859
Implantable Lead Location: 753860
Implantable Lead Model: 7741
Implantable Lead Model: 7742
Implantable Lead Serial Number: 1047491
Implantable Lead Serial Number: 1074664
Implantable Pulse Generator Implant Date: 20191003
Lead Channel Impedance Value: 610 Ohm
Lead Channel Impedance Value: 636 Ohm
Lead Channel Pacing Threshold Amplitude: 0.8 V
Lead Channel Pacing Threshold Amplitude: 1.9 V
Lead Channel Pacing Threshold Pulse Width: 0.4 ms
Lead Channel Pacing Threshold Pulse Width: 0.4 ms
Lead Channel Setting Pacing Amplitude: 2.5 V
Lead Channel Setting Pacing Amplitude: 3.5 V
Lead Channel Setting Pacing Pulse Width: 0.4 ms
Lead Channel Setting Sensing Sensitivity: 1 mV
Pulse Gen Serial Number: 444544
Zone Setting Status: 755011

## 2024-03-19 MED ORDER — DILTIAZEM HCL ER 120 MG PO CP12: 120.0000 mg | ORAL_CAPSULE | Freq: Every day | ORAL | 3 refills | Status: DC

## 2024-03-19 MED ORDER — APIXABAN 2.5 MG PO TABS: 2.5000 mg | ORAL_TABLET | Freq: Two times a day (BID) | ORAL | 3 refills | Status: DC

## 2024-03-19 NOTE — Patient Instructions (Signed)
 Medication Instructions:  START TAKING : ELIQUIS 2.5 MG TWICE A DAY    START TAKING :  DILTIAZEM 120 MG ONCE A DAY   FOR 4 DAYS ONLY: TAKE FUROSEMIDE 40 MG ( 2 TABLETS) THEN RESUME BACK TO FUROSEMIDE 20 MG ONCE A DAY    *If you need a refill on your cardiac medications before your next appointment, please call your pharmacy*   Lab Work:NONE ORDERED  TODAY    If you have labs (blood work) drawn today and your tests are completely normal, you will receive your results only by: MyChart Message (if you have MyChart) OR A paper copy in the mail If you have any lab test that is abnormal or we need to change your treatment, we will call you to review the results.  Testing/Procedures: A chest x-ray takes a picture of the organs and structures inside the chest, including the heart, lungs, and blood vessels. This test can show several things, including, whether the heart is enlarges; whether fluid is building up in the lungs; and whether pacemaker / defibrillator leads are still in place.  Address: [address] [place], [place], [place] [address] Phone: [phone] Hours:  Monday 6:30?AM-7?PM Tuesday 6:30?AM-7?PM Wednesday 6:30?AM-7?PM Thursday 6:30?AM-7?PM Friday 6:30?AM-7?PM Saturday 6:30?AM-7?PM Sunday 6:30?AM-7?PM    Follow-Up: At [facility], you and your health needs are our priority.  As part of our continuing mission to provide you with exceptional heart care, our providers are all part of one team.  This team includes your primary Cardiologist (physician) and Advanced Practice Providers or APPs (Physician Assistants and Nurse Practitioners) who all work together to provide you with the care you need, when you need it.  Your next appointment:   3 week(s)  Provider:    You may see ANY AVAILABLE APP    We recommend signing up for the patient portal called "MyChart".  Sign up information is provided on this After Visit Summary.  MyChart is used to connect with patients  for Virtual Visits (Telemedicine).  Patients are able to view lab/test results, encounter notes, upcoming appointments, etc.  Non-urgent messages can be sent to your provider as well.   To learn more about what you can do with MyChart, go to [email or web address].   Other Instructions       1st Floor: - Lobby - Registration  - Pharmacy  - Lab - Cafe  2nd Floor: - PV Lab - Diagnostic Testing (echo, CT, nuclear med)  3rd Floor: - Vacant  4th Floor: - TCTS (cardiothoracic surgery) - AFib Clinic - Structural Heart Clinic - Vascular Surgery  - Vascular Ultrasound  5th Floor: - HeartCare Cardiology (general and EP) - Clinical Pharmacy for coumadin, hypertension, lipid, weight-loss medications, and med management appointments    Valet parking services will be available as well.

## 2024-03-21 ENCOUNTER — Encounter: Admitting: Pulmonary Disease

## 2024-03-24 ENCOUNTER — Encounter: Payer: Self-pay | Admitting: Internal Medicine

## 2024-03-26 ENCOUNTER — Telehealth: Payer: Self-pay | Admitting: Internal Medicine

## 2024-03-26 DIAGNOSIS — Z79899 Other long term (current) drug therapy: Secondary | ICD-10-CM

## 2024-03-26 NOTE — Telephone Encounter (Signed)
 Spoke with pt regarding her swelling. Pt stated that both of her legs are swollen. Pt was unsure how much weight she has gained but is still able to move around well. Pt stated she has some sob when moving around but no other symptoms. Pt stated she was told to take 40 mg of Lasix for 4 days at her last appointment on 4/7 with Mertha Abrahams, PA-C but she lives in a nursing home and does not manage her medication and is not sure when she started the 40 mg for 4 days. Pt stated she does not think it has helped. Pt was advised to elevate her legs, try to reduce salty and processed foods and wear compression stockings. Pt was told that the information provided would be forwarded to Pompeys Pillar, PA-C for her suggestions. Pt has an upcoming appointment with Valeri Gate, PA-C on 5/6. Pt verbalized understanding. All questions, if any, were answered.

## 2024-03-26 NOTE — Telephone Encounter (Signed)
 Pt c/o swelling/edema: STAT if pt has developed SOB within 24 hours  If swelling, where is the swelling located? Bilat LE  How much weight have you gained and in what time span? Unsure  Have you gained 2 pounds in a day or 5 pounds in a week? Unsure  Do you have a log of your daily weights (if so, list)? No  Are you currently taking a fluid pill? yes  Are you currently SOB? No  Have you traveled recently in a car or plane for an extended period of time? No but pt uses a wheelchair for transport

## 2024-03-27 NOTE — Telephone Encounter (Signed)
 Left voice message to call back 4/15

## 2024-03-28 MED ORDER — FUROSEMIDE 40 MG PO TABS: 40.0000 mg | ORAL_TABLET | Freq: Every day | ORAL | 3 refills | Status: DC

## 2024-03-28 NOTE — Addendum Note (Signed)
 Addended by: Johnmark Geiger N on: 03/28/2024 01:25 PM   Modules accepted: Orders

## 2024-03-28 NOTE — Telephone Encounter (Signed)
 Spoke with pt. Pt completed the 4 days of Lasix 40 mg once daily. She stated she believes her Diltiazem was reduced to 120 mg once daily. Pt was notified of increase in Lasix to 40 mg once daily. Pt aware of BMET to be drawn in 1 week after starting new dose of Lasix. A BMET was ordered and released. An appointment was made with Dr. Berry Bristol on 4/22. A message was sent to EP scheduling to find the patient an appointment with an EP APP in roughly 10 days. Pt verbalized understanding. All questions if any were answered.

## 2024-04-03 ENCOUNTER — Ambulatory Visit: Attending: Cardiology | Admitting: Cardiology

## 2024-04-04 ENCOUNTER — Encounter: Payer: Self-pay | Admitting: Cardiology

## 2024-04-10 NOTE — Progress Notes (Addendum)
 Electrophysiology Office Note:   Date:  04/11/2024  ID:  Kirsten Jensen, DOB 08/09/1932, MRN [id number]  Primary Cardiologist: Richardo Chandler, MD Primary Heart Failure: None Electrophysiologist: Richardo Chandler, MD       History of Present Illness:   Kirsten Jensen is a 89 y.o. female, legally blind, with h/o AF, AFL, tachy-brady s/p PPM, HTN, CAD s/p PCI (2018 in [place]), DM and OSA on CPAP seen today for routine electrophysiology followup.   Seen in EP Clinic 03/19/24 with multiple issues - SOB, lightheaded, decline in functional status, low energy and hemorrhoidal bleeding, recent labs with Nephrology suggestive of possible cardiac source of shortness of breath.  BNP 4697, Cr 1.66, Hgb 8.3.   Since last being seen in our clinic the patient reports she continues to feel weak, fatigued, short of breath with exertion.  She states this has been ongoing for at least 3 months.  She finished the pulse dose of lasix  and could not tell a difference in her shortness of breath.  She notes her weight goes up / down by 2-4 lbs during the week. She has had hemorrhoidal bleeding that resulted in a significant Hgb drop. She is pending iron transfusions. She follows with Dr. [name] from Nephrology.  She reports she is pending a move to [place], [place].     She denies chest pain, palpitations, dyspnea, PND, orthopnea, nausea, vomiting, syncope, weight gain, or early satiety.   Review of systems complete and found to be negative unless listed in HPI.   EP Information / Studies Reviewed:    EKG is not ordered today. EKG from 11/08/23 reviewed which showed AFL, VP 78 bpm      PPM Interrogation-  reviewed in detail today,  See PACEART report.  Device History: Field seismologist PPM implanted 09/14/2018 for Sinus Node Dysfunction / Tachy-Brady Syndrome  Known low RV sensing amplitude  Dependent in both A/V   Studies:  TEE 09/2023 > LVEF 60-65%, LA mildly dilated, mild MV regurgitation, moderate TV  regurgitation, device leads well visualized with no attached thrombus  Arrhythmia / AAD AF  Amiodarone  > initiated 11/08/23 for AF    Risk Assessment/Calculations:    CHA2DS2-VASc Score = 6   This indicates a 9.7% annual risk of stroke. The patient's score is based upon: CHF History: 1 HTN History: 1 Diabetes History: 1 Stroke History: 0 Vascular Disease History: 0 Age Score: 2 Gender Score: 1             Physical Exam:   VS:  BP (!) 100/40   Pulse 61   Ht 5\' 7"  (1.702 m)   Wt 254 lb (115.2 kg)   LMP  (LMP Unknown)   SpO2 91%   BMI 39.78 kg/m    Wt Readings from Last 3 Encounters:  04/11/24 254 lb (115.2 kg)  03/19/24 252 lb 12.8 oz (114.7 kg)  01/05/24 235 lb (106.6 kg)     GEN: Well nourished, well developed in no acute distress NECK: No JVD; No carotid bruits CARDIAC: Regular rate and rhythm, no murmurs, rubs, gallops RESPIRATORY:  Clear to auscultation without rales, wheezing or rhonchi  ABDOMEN: Soft, non-tender, non-distended EXTREMITIES: 3+ LE pitting edema upt to knee; No deformity   ASSESSMENT AND PLAN:    SND / Tachy-Brady Syndrome s/p [employer] PPM  -Normal PPM function -See Valeta Gaudier Art report -appropriate atrial capture, Rhythmiq IQ turned off, AV search turned off with Industry support  -pt self reports decreased activity but her  year review on her device shows baseline low activity level that is unchanged  Dyspnea Fatigue Suspect multifactorial in the setting of anemia (hemorrhoidal bleeding, Hgb in 09/2023 12.9, 03/14/24 8.3), obesity, deconditioning, & edema -recent lasix  increase for 4 days, then to 40 mg daily > was to have BMP but does not appear it was completed.   -change lasix  to 40 mg daily except 60mg  on T/Th > BMP in 7-10 days to assess renal function. Called [facility] to discuss with RN on duty (Desire) to ensure labs are drawn there at patients request. They use [facility] for lab draws. I will call the facility to check on  labs.  -CXR reviewed, interstitial edema    Persistent Atrial Fibrillation  Atrial Flutter  CHA2DS2-VASc 6  -continue OAC for stroke prophylaxis  -0% burden on device  -continue amiodarone , LFT's & TSH wnl in 12/2023  Secondary Hypercoagulable State  -continue Eliquis  2.5 mg BID, dose reviewed and appropriate by age / Cr  Hypertension  -well controlled on current regimen   -continue diltiazem  120 mg daily (recently reduced)  HFpEF  -ongoing 3+ LE edema up to thigh, lasix  increased as above  -GDMT per Cardiology   CKD  -per Nephrology     Anemia  -work up per primary  -hemorrhoidal bleeding for some time  Disposition:   Follow up with Dr. Rodolfo Clan in 6 months  Signed, Creighton Doffing, NP-C, AGACNP-BC [facility] - Electrophysiology  04/11/2024, 1:22 PM   Addendum 04/23/24:  [facility] to get an update on her labs.  Staff reports labs were not drawn.  They indicate she was sent to the hospital for volume overload.    Creighton Doffing, NP-C, AGACNP-BC [facility] - Electrophysiology  04/23/2024, 8:55 AM

## 2024-04-11 ENCOUNTER — Ambulatory Visit: Attending: Pulmonary Disease | Admitting: Pulmonary Disease

## 2024-04-11 ENCOUNTER — Encounter: Payer: Self-pay | Admitting: Pulmonary Disease

## 2024-04-11 VITALS — BP 100/40 | HR 61 | Ht 67.0 in | Wt 254.0 lb

## 2024-04-11 DIAGNOSIS — D6869 Other thrombophilia: Secondary | ICD-10-CM | POA: Insufficient documentation

## 2024-04-11 DIAGNOSIS — I495 Sick sinus syndrome: Secondary | ICD-10-CM | POA: Insufficient documentation

## 2024-04-11 DIAGNOSIS — I480 Paroxysmal atrial fibrillation: Secondary | ICD-10-CM | POA: Insufficient documentation

## 2024-04-11 DIAGNOSIS — I10 Essential (primary) hypertension: Secondary | ICD-10-CM | POA: Diagnosis present

## 2024-04-11 DIAGNOSIS — I5032 Chronic diastolic (congestive) heart failure: Secondary | ICD-10-CM | POA: Insufficient documentation

## 2024-04-11 DIAGNOSIS — I4892 Unspecified atrial flutter: Secondary | ICD-10-CM | POA: Diagnosis present

## 2024-04-11 DIAGNOSIS — Z950 Presence of cardiac pacemaker: Secondary | ICD-10-CM | POA: Insufficient documentation

## 2024-04-11 LAB — CUP PACEART INCLINIC DEVICE CHECK
Date Time Interrogation Session: 20250430132844
Implantable Lead Connection Status: 753985
Implantable Lead Connection Status: 753985
Implantable Lead Implant Date: 20191003
Implantable Lead Implant Date: 20191003
Implantable Lead Location: 753859
Implantable Lead Location: 753860
Implantable Lead Model: 7741
Implantable Lead Model: 7742
Implantable Lead Serial Number: 1047491
Implantable Lead Serial Number: 1074664
Implantable Pulse Generator Implant Date: 20191003
Pulse Gen Serial Number: 444544

## 2024-04-11 MED ORDER — FUROSEMIDE 40 MG PO TABS: ORAL_TABLET | ORAL | 3 refills | Status: DC

## 2024-04-11 NOTE — Patient Instructions (Signed)
 Medication Instructions:  Continue lasix  40 mg daily except on Tuesdays and Thursdays take 60 mg *If you need a refill on your cardiac medications before your next appointment, please call your pharmacy*  Lab Work: BMET to be drawn at your facility after 1st week of lasix  dose change If you have labs (blood work) drawn today and your tests are completely normal, you will receive your results only by: MyChart Message (if you have MyChart) OR A paper copy in the mail If you have any lab test that is abnormal or we need to change your treatment, we will call you to review the results.  Follow-Up: At [facility], you and your health needs are our priority.  As part of our continuing mission to provide you with exceptional heart care, our providers are all part of one team.  This team includes your primary Cardiologist (physician) and Advanced Practice Providers or APPs (Physician Assistants and Nurse Practitioners) who all work together to provide you with the care you need, when you need it.  Your next appointment:   6 month(s)  Provider:   You will see one of the following Advanced Practice Providers on your designated Care Team:   Mertha Abrahams, [place] Joycelyn Noa" [place], [place] Creighton Doffing, NP  Talk with your primary care provider regarding physical therapy and how to get your hemoglobin up.

## 2024-04-15 NOTE — Progress Notes (Signed)
 Remote pacemaker transmission.

## 2024-04-15 NOTE — Addendum Note (Signed)
 Addended by: Lott Rouleau A on: 04/15/2024 05:20 AM   Modules accepted: Orders

## 2024-04-17 ENCOUNTER — Ambulatory Visit: Admitting: Student

## 2024-04-17 ENCOUNTER — Telehealth: Payer: Self-pay | Admitting: Internal Medicine

## 2024-04-17 NOTE — Telephone Encounter (Signed)
 Spoke with Valinda Gault at [facility] and she states patient has fluid from her abdomen all the way down to her feet. She states its at least 4+ edema. Patient can no longer move around or walk. She has declined since last OV. She believes has gained weight but she has so much swelling they can't get her in the chair. She has SOB. She has become incontinent. Advised to send patient to the hospital to have her evaluated.

## 2024-04-17 NOTE — Telephone Encounter (Signed)
 New Message:    She wants to talk to a nurse, to give an update on patient's condition.he

## 2024-04-20 ENCOUNTER — Inpatient Hospital Stay (HOSPITAL_COMMUNITY): Admission: EM | Admit: 2024-04-20 | Discharge: 2024-05-13 | DRG: 291 | Disposition: E | Source: Skilled Nursing Facility

## 2024-04-20 ENCOUNTER — Other Ambulatory Visit: Payer: Self-pay

## 2024-04-20 ENCOUNTER — Encounter (HOSPITAL_COMMUNITY): Payer: Self-pay | Admitting: Emergency Medicine

## 2024-04-20 ENCOUNTER — Emergency Department (HOSPITAL_COMMUNITY)

## 2024-04-20 ENCOUNTER — Ambulatory Visit: Admitting: Student

## 2024-04-20 DIAGNOSIS — Z823 Family history of stroke: Secondary | ICD-10-CM

## 2024-04-20 DIAGNOSIS — I5031 Acute diastolic (congestive) heart failure: Secondary | ICD-10-CM | POA: Diagnosis not present

## 2024-04-20 DIAGNOSIS — I130 Hypertensive heart and chronic kidney disease with heart failure and stage 1 through stage 4 chronic kidney disease, or unspecified chronic kidney disease: Principal | ICD-10-CM | POA: Diagnosis present

## 2024-04-20 DIAGNOSIS — I5030 Unspecified diastolic (congestive) heart failure: Secondary | ICD-10-CM | POA: Diagnosis not present

## 2024-04-20 DIAGNOSIS — N179 Acute kidney failure, unspecified: Secondary | ICD-10-CM | POA: Diagnosis present

## 2024-04-20 DIAGNOSIS — H548 Legal blindness, as defined in USA: Secondary | ICD-10-CM | POA: Diagnosis present

## 2024-04-20 DIAGNOSIS — Z79899 Other long term (current) drug therapy: Secondary | ICD-10-CM

## 2024-04-20 DIAGNOSIS — R001 Bradycardia, unspecified: Secondary | ICD-10-CM | POA: Diagnosis not present

## 2024-04-20 DIAGNOSIS — R600 Localized edema: Principal | ICD-10-CM

## 2024-04-20 DIAGNOSIS — N170 Acute kidney failure with tubular necrosis: Secondary | ICD-10-CM | POA: Diagnosis not present

## 2024-04-20 DIAGNOSIS — D509 Iron deficiency anemia, unspecified: Secondary | ICD-10-CM | POA: Diagnosis present

## 2024-04-20 DIAGNOSIS — Z515 Encounter for palliative care: Secondary | ICD-10-CM | POA: Diagnosis not present

## 2024-04-20 DIAGNOSIS — I509 Heart failure, unspecified: Secondary | ICD-10-CM | POA: Diagnosis present

## 2024-04-20 DIAGNOSIS — I10 Essential (primary) hypertension: Secondary | ICD-10-CM | POA: Diagnosis not present

## 2024-04-20 DIAGNOSIS — K921 Melena: Secondary | ICD-10-CM | POA: Diagnosis present

## 2024-04-20 DIAGNOSIS — Z7984 Long term (current) use of oral hypoglycemic drugs: Secondary | ICD-10-CM

## 2024-04-20 DIAGNOSIS — K648 Other hemorrhoids: Secondary | ICD-10-CM | POA: Diagnosis present

## 2024-04-20 DIAGNOSIS — Z7901 Long term (current) use of anticoagulants: Secondary | ICD-10-CM | POA: Diagnosis not present

## 2024-04-20 DIAGNOSIS — I272 Pulmonary hypertension, unspecified: Secondary | ICD-10-CM | POA: Diagnosis present

## 2024-04-20 DIAGNOSIS — N1832 Chronic kidney disease, stage 3b: Secondary | ICD-10-CM | POA: Diagnosis present

## 2024-04-20 DIAGNOSIS — J9601 Acute respiratory failure with hypoxia: Secondary | ICD-10-CM | POA: Diagnosis not present

## 2024-04-20 DIAGNOSIS — I462 Cardiac arrest due to underlying cardiac condition: Secondary | ICD-10-CM | POA: Diagnosis not present

## 2024-04-20 DIAGNOSIS — I495 Sick sinus syndrome: Secondary | ICD-10-CM | POA: Diagnosis present

## 2024-04-20 DIAGNOSIS — E039 Hypothyroidism, unspecified: Secondary | ICD-10-CM | POA: Diagnosis present

## 2024-04-20 DIAGNOSIS — Z803 Family history of malignant neoplasm of breast: Secondary | ICD-10-CM

## 2024-04-20 DIAGNOSIS — R4182 Altered mental status, unspecified: Secondary | ICD-10-CM | POA: Diagnosis not present

## 2024-04-20 DIAGNOSIS — E875 Hyperkalemia: Secondary | ICD-10-CM | POA: Diagnosis not present

## 2024-04-20 DIAGNOSIS — Z79890 Hormone replacement therapy (postmenopausal): Secondary | ICD-10-CM

## 2024-04-20 DIAGNOSIS — E785 Hyperlipidemia, unspecified: Secondary | ICD-10-CM | POA: Diagnosis present

## 2024-04-20 DIAGNOSIS — R682 Dry mouth, unspecified: Secondary | ICD-10-CM | POA: Diagnosis present

## 2024-04-20 DIAGNOSIS — R262 Difficulty in walking, not elsewhere classified: Secondary | ICD-10-CM | POA: Diagnosis present

## 2024-04-20 DIAGNOSIS — I2510 Atherosclerotic heart disease of native coronary artery without angina pectoris: Secondary | ICD-10-CM | POA: Diagnosis not present

## 2024-04-20 DIAGNOSIS — Z950 Presence of cardiac pacemaker: Secondary | ICD-10-CM

## 2024-04-20 DIAGNOSIS — I959 Hypotension, unspecified: Secondary | ICD-10-CM | POA: Diagnosis not present

## 2024-04-20 DIAGNOSIS — M62838 Other muscle spasm: Secondary | ICD-10-CM | POA: Diagnosis present

## 2024-04-20 DIAGNOSIS — Z6841 Body Mass Index (BMI) 40.0 and over, adult: Secondary | ICD-10-CM

## 2024-04-20 DIAGNOSIS — Z8249 Family history of ischemic heart disease and other diseases of the circulatory system: Secondary | ICD-10-CM

## 2024-04-20 DIAGNOSIS — R059 Cough, unspecified: Secondary | ICD-10-CM | POA: Diagnosis not present

## 2024-04-20 DIAGNOSIS — E872 Acidosis, unspecified: Secondary | ICD-10-CM | POA: Diagnosis present

## 2024-04-20 DIAGNOSIS — I5033 Acute on chronic diastolic (congestive) heart failure: Secondary | ICD-10-CM | POA: Diagnosis present

## 2024-04-20 DIAGNOSIS — G9341 Metabolic encephalopathy: Secondary | ICD-10-CM | POA: Diagnosis present

## 2024-04-20 DIAGNOSIS — I4891 Unspecified atrial fibrillation: Secondary | ICD-10-CM | POA: Diagnosis not present

## 2024-04-20 DIAGNOSIS — K922 Gastrointestinal hemorrhage, unspecified: Secondary | ICD-10-CM | POA: Diagnosis present

## 2024-04-20 DIAGNOSIS — E66813 Obesity, class 3: Secondary | ICD-10-CM | POA: Diagnosis present

## 2024-04-20 DIAGNOSIS — M7989 Other specified soft tissue disorders: Secondary | ICD-10-CM | POA: Diagnosis present

## 2024-04-20 DIAGNOSIS — I480 Paroxysmal atrial fibrillation: Secondary | ICD-10-CM | POA: Diagnosis present

## 2024-04-20 DIAGNOSIS — I4819 Other persistent atrial fibrillation: Secondary | ICD-10-CM

## 2024-04-20 DIAGNOSIS — K644 Residual hemorrhoidal skin tags: Secondary | ICD-10-CM | POA: Diagnosis present

## 2024-04-20 DIAGNOSIS — E8721 Acute metabolic acidosis: Secondary | ICD-10-CM | POA: Diagnosis not present

## 2024-04-20 DIAGNOSIS — E1122 Type 2 diabetes mellitus with diabetic chronic kidney disease: Secondary | ICD-10-CM | POA: Diagnosis present

## 2024-04-20 DIAGNOSIS — E8770 Fluid overload, unspecified: Secondary | ICD-10-CM | POA: Diagnosis present

## 2024-04-20 DIAGNOSIS — Z955 Presence of coronary angioplasty implant and graft: Secondary | ICD-10-CM

## 2024-04-20 DIAGNOSIS — Z993 Dependence on wheelchair: Secondary | ICD-10-CM

## 2024-04-20 LAB — CBC WITH DIFFERENTIAL/PLATELET
Abs Immature Granulocytes: 0.06 10*3/uL (ref 0.00–0.07)
Basophils Absolute: 0 10*3/uL (ref 0.0–0.1)
Basophils Relative: 1 %
Eosinophils Absolute: 0 10*3/uL (ref 0.0–0.5)
Eosinophils Relative: 1 %
HCT: 27.6 % — ABNORMAL LOW (ref 36.0–46.0)
Hemoglobin: 8.5 g/dL — ABNORMAL LOW (ref 12.0–15.0)
Immature Granulocytes: 1 %
Lymphocytes Relative: 13 %
Lymphs Abs: 0.7 10*3/uL (ref 0.7–4.0)
MCH: 32.8 pg (ref 26.0–34.0)
MCHC: 30.8 g/dL (ref 30.0–36.0)
MCV: 106.6 fL — ABNORMAL HIGH (ref 80.0–100.0)
Monocytes Absolute: 0.6 10*3/uL (ref 0.1–1.0)
Monocytes Relative: 10 %
Neutro Abs: 4.2 10*3/uL (ref 1.7–7.7)
Neutrophils Relative %: 74 %
Platelets: 222 10*3/uL (ref 150–400)
RBC: 2.59 MIL/uL — ABNORMAL LOW (ref 3.87–5.11)
RDW: 17.9 % — ABNORMAL HIGH (ref 11.5–15.5)
WBC: 5.6 10*3/uL (ref 4.0–10.5)
nRBC: 0 % (ref 0.0–0.2)

## 2024-04-20 LAB — COMPREHENSIVE METABOLIC PANEL WITH GFR
ALT: 25 U/L (ref 0–44)
AST: 62 U/L — ABNORMAL HIGH (ref 15–41)
Albumin: 2.9 g/dL — ABNORMAL LOW (ref 3.5–5.0)
Alkaline Phosphatase: 70 U/L (ref 38–126)
Anion gap: 19 — ABNORMAL HIGH (ref 5–15)
BUN: 46 mg/dL — ABNORMAL HIGH (ref 8–23)
CO2: 21 mmol/L — ABNORMAL LOW (ref 22–32)
Calcium: 10.2 mg/dL (ref 8.9–10.3)
Chloride: 97 mmol/L — ABNORMAL LOW (ref 98–111)
Creatinine, Ser: 3.16 mg/dL — ABNORMAL HIGH (ref 0.44–1.00)
GFR, Estimated: 14 mL/min — ABNORMAL LOW (ref >60)
Glucose, Bld: 226 mg/dL — ABNORMAL HIGH (ref 70–99)
Potassium: 3.5 mmol/L (ref 3.5–5.1)
Sodium: 137 mmol/L (ref 135–145)
Total Bilirubin: 1 mg/dL (ref 0.0–1.2)
Total Protein: 8.5 g/dL — ABNORMAL HIGH (ref 6.5–8.1)

## 2024-04-20 LAB — TROPONIN I (HIGH SENSITIVITY): Troponin I (High Sensitivity): 123 ng/L (ref <18)

## 2024-04-20 LAB — I-STAT CHEM 8, ED
BUN: 40 mg/dL — ABNORMAL HIGH (ref 8–23)
Calcium, Ion: 1.22 mmol/L (ref 1.15–1.40)
Chloride: 100 mmol/L (ref 98–111)
Creatinine, Ser: 3.5 mg/dL — ABNORMAL HIGH (ref 0.44–1.00)
Glucose, Bld: 222 mg/dL — ABNORMAL HIGH (ref 70–99)
HCT: 27 % — ABNORMAL LOW (ref 36.0–46.0)
Hemoglobin: 9.2 g/dL — ABNORMAL LOW (ref 12.0–15.0)
Potassium: 3.5 mmol/L (ref 3.5–5.1)
Sodium: 137 mmol/L (ref 135–145)
TCO2: 22 mmol/L (ref 22–32)

## 2024-04-20 LAB — BRAIN NATRIURETIC PEPTIDE: B Natriuretic Peptide: 2719.9 pg/mL — ABNORMAL HIGH (ref 0.0–100.0)

## 2024-04-20 MED ORDER — PANTOPRAZOLE SODIUM 40 MG IV SOLR
40.0000 mg | Freq: Once | INTRAVENOUS | Status: AC
  Administered 2024-04-20: 40 mg via INTRAVENOUS
  Filled 2024-04-20: qty 10

## 2024-04-20 MED ORDER — HYDROCORTISONE ACETATE 25 MG RE SUPP
25.0000 mg | Freq: Two times a day (BID) | RECTAL | Status: DC
  Administered 2024-04-20 – 2024-04-21 (×2): 25 mg via RECTAL
  Filled 2024-04-20 (×12): qty 1

## 2024-04-20 MED ORDER — FUROSEMIDE 10 MG/ML IJ SOLN
40.0000 mg | Freq: Two times a day (BID) | INTRAMUSCULAR | Status: DC
  Administered 2024-04-20 – 2024-04-21 (×2): 40 mg via INTRAVENOUS
  Filled 2024-04-20 (×2): qty 4

## 2024-04-20 MED ORDER — FUROSEMIDE 10 MG/ML IJ SOLN
40.0000 mg | Freq: Once | INTRAMUSCULAR | Status: AC
  Administered 2024-04-20: 40 mg via INTRAVENOUS
  Filled 2024-04-20: qty 4

## 2024-04-20 NOTE — Telephone Encounter (Signed)
 Spoke with patient and she is aware provider recommends ED evaluation.  She verbalized understanding. Canceling today's appointment.

## 2024-04-20 NOTE — ED Provider Notes (Signed)
 Plan for admission. Physical Exam  BP 123/84 (BP Location: Right Wrist)   Pulse (!) 57   Temp 97.9 F (36.6 C) (Oral)   Resp 17   Ht 5\' 7"  (1.702 m)   Wt 116.6 kg   LMP  (LMP Unknown)   SpO2 96%   BMI 40.25 kg/m   Physical Exam  Procedures  Procedures  ED Course / MDM    Medical Decision Making Amount and/or Complexity of Data Reviewed Labs: ordered. Radiology: ordered.  Risk Prescription drug management.          Wynetta Heckle, MD 04/29/24 [phone]

## 2024-04-20 NOTE — ED Triage Notes (Signed)
 Pt BIBA from [facility] assisted living. Pt has had increase swelling in legs bilaterally and in the abd as well. Pt ambulates w/ a cane @ baseline. Recently has been having dyspnea on exertion. Pt is A&O x4. Hx: CHF, DM

## 2024-04-20 NOTE — Consult Note (Signed)
 Cardiology Consultation:   Patient ID: FINLEIGH VANCLEAVE; [id number]; 1932-11-18   Admit date: 04/20/2024 Date of Consult: 04/20/2024  Primary Care Provider: Tena Feeling, MD Primary Cardiologist: Kirsten Chandler, MD  Primary Electrophysiologist:  None   Patient Profile:   Kirsten Jensen is a 89 y.o. female with a hx of tachybradycardia syndrome status post permanent pacemaker, coronary artery disease who is being seen today for the evaluation of shortness of breath at the request of Kirsten Jensen.  History of Present Illness:   Kirsten Jensen is followed by Kirsten Jensen.  She has a history of tachybradycardia syndrome and pacemaker.  She had PCI in [place]  in 2018.  She was last seen in the clinic in April of this year.  She has normal pacemaker function.  She is complaining of dyspnea and fatigue which was felt to be multifactorial.  She has obesity, deconditioning, anemia.  She did have her Lasix  adjusted but she has renal insufficiency which complicates the issue.  She has had previous persistent fibrillation but has been maintaining sinus rhythm.  She tolerates anticoagulation.  She presented to the emergency room with progressive lower extremity swelling.  She reports that she has had difficulty walking.  She gets short of breath and very fatigued because her legs are so heavy.  She recovers quickly.  She is in an assisted living facility.  She is not sure what meds she has been getting.  She is not sure of the dose of Lasix  and thinks she is still getting an ACE inhibitor.    I don't see an ACE inhibitor listed on the EMS runsheet.  She is not actually complaining of new resting shortness of breath. She has no PND or orthopnea.  She is not having chest pain or palpitations.   She presents with acute on chronic renal insufficiency now with a creatinine of 3.16.  3 months ago was 1.63.  Troponin is mildly elevated 123.  She has a new anemia with a hemoglobin 8.5.  She reports some blood in her stools.     Past Medical History:  Diagnosis Date   AF (paroxysmal atrial fibrillation) (HCC) 03/25/2021   Atrial fibrillation (HCC)    Coronary artery disease    stenting x 2   Diabetes (HCC)    Dyslipidemia 04/05/2020   Hypertension    Morbid obesity (HCC)    Pacemaker    Paroxysmal atrial fibrillation (HCC) 11/06/2019   Secondary hypercoagulable state (HCC) 11/06/2019   T2DM (type 2 diabetes mellitus) (HCC) 04/05/2020   Tachycardia-bradycardia syndrome (HCC) 03/17/2020    Past Surgical History:  Procedure Laterality Date   CARDIOVERSION N/A 10/10/2023   Procedure: CARDIOVERSION;  Surgeon: Kirsten Lites, MD;  Location: [facility] INVASIVE CV LAB;  Service: Cardiovascular;  Laterality: N/A;   COLONOSCOPY N/A 11/02/2020   Procedure: COLONOSCOPY;  Surgeon: Kirsten Blunt, MD;  Location[facility] ENDOSCOPY;  Service: Endoscopy;  Laterality: N/A;   ESOPHAGOGASTRODUODENOSCOPY N/A 11/01/2020   Procedure: ESOPHAGOGASTRODUODENOSCOPY (EGD);  Surgeon: Kirsten Blunt, MD;  Location: [facility] ENDOSCOPY;  Service: Endoscopy;  Laterality: N/A;   TRANSESOPHAGEAL ECHOCARDIOGRAM (CATH LAB)  10/10/2023   Procedure: TRANSESOPHAGEAL ECHOCARDIOGRAM;  Surgeon: Kirsten Lites, MD;  Location: [facility] INVASIVE CV LAB;  Service: Cardiovascular;;     Home Medications:  Prior to Admission medications   Medication Sig Start Date End Date Taking? Authorizing Provider  acetaminophen  (TYLENOL ) 500 MG tablet Take 500 mg by mouth every 6 (six) hours as needed for mild pain (pain score 1-3).  Yes [provider]  amiodarone  (PACERONE ) 200 MG tablet Take 1 tablet (200 mg total) by mouth daily. Patient taking differently: Take 200 mg by mouth in the morning. 11/22/23  Yes Kirsten Fails, PA-C  apixaban  (ELIQUIS ) 2.5 MG TABS tablet Take 1 tablet (2.5 mg total) by mouth 2 (two) times daily. 03/19/24  Yes Kirsten Fails, PA-C  ascorbic acid  (VITAMIN C) 500 MG tablet Take 1 tablet (500 mg total) by mouth daily. Patient taking differently: Take  500 mg by mouth in the morning. 03/27/21  Yes Kirsten Croft, MD  buPROPion  (WELLBUTRIN ) 100 MG tablet Take 100 mg by mouth at bedtime.   Yes [provider]  calcium carbonate (OSCAL) 1500 (600 Ca) MG TABS tablet Take 600 mg of elemental calcium by mouth 2 (two) times daily.   Yes [provider]  cholecalciferol  (VITAMIN D3) 25 MCG (1000 UNIT) tablet Take 1,000 Units by mouth in the morning.   Yes [provider]  diltiazem  (CARDIZEM  SR) 120 MG 12 hr capsule Take 1 capsule (120 mg total) by mouth daily. Patient taking differently: Take 120 mg by mouth in the morning. 03/19/24  Yes Kirsten Fails, PA-C  ferrous sulfate  325 (65 FE) MG tablet Take 1 tablet (325 mg total) by mouth daily with breakfast. Patient taking differently: Take 325 mg by mouth in the morning. 11/05/20 04/20/24 Yes Kirsten Savoy, MD  fluticasone (FLONASE) 50 MCG/ACT nasal spray Place 1 spray into both nostrils daily.   Yes [provider]  furosemide  (LASIX ) 40 MG tablet Take one tablet by mouth daily, except on Tuesdays and Thursdays, take 60 mg. Patient taking differently: Take 40 mg by mouth daily. Take one tablet by mouth daily except on Tuesdays and Thursdays. 04/11/24  Yes Jensen, Kirsten L, NP  FUROSEMIDE  PO Take 60 mg by mouth 2 (two) times a week. Tuesday and Thursday   Yes [provider]  gemfibrozil  (LOPID ) 600 MG tablet Take 600 mg by mouth 2 (two) times daily. 07/04/22  Yes [provider]  HYDROcodone -acetaminophen  (NORCO/VICODIN) 5-325 MG tablet Take 1 tablet by mouth every 8 (eight) hours as needed for moderate pain (pain score 4-6). 09/08/22  Yes [provider]  hydrocortisone cream 1 % Apply 1 Application topically as needed for itching.   Yes [provider]  JARDIANCE 10 MG TABS tablet Take 10 mg by mouth in the morning. 04/23/22  Yes [provider]  levothyroxine  (SYNTHROID ) 50 MCG tablet Take 50 mcg by mouth daily before  breakfast.   Yes [provider]  magnesium  oxide (MAG-OX) 400 MG tablet Take 1 tablet (400 mg total) by mouth daily. Patient taking differently: Take 400 mg by mouth in the morning. 02/05/20  Yes Fenton, Clint R, PA  metFORMIN  (GLUCOPHAGE ) 500 MG tablet Take 500 mg by mouth in the morning.   Yes [provider]  metoprolol  succinate (TOPROL  XL) 25 MG 24 hr tablet Take 1 tablet (25 mg total) by mouth daily. Patient taking differently: Take 25 mg by mouth in the morning. 06/02/22  Yes Verona Goodwill, MD  Multiple Vitamins-Minerals (OCUVITE ADULT 50+ PO) Take 1 capsule by mouth in the morning.   Yes [provider]  pantoprazole  (PROTONIX ) 40 MG tablet Take 1 tablet (40 mg total) by mouth 2 (two) times daily before a meal. Patient taking differently: Take 40 mg by mouth in the morning. 11/05/20  Yes Kirsten Savoy, MD  polyethylene glycol (MIRALAX  / GLYCOLAX ) 17 g  packet Take 17 g by mouth as needed for mild constipation or moderate constipation.   Yes [provider]  polyethylene glycol powder (GLYCOLAX /MIRALAX ) 17 GM/SCOOP powder Take 17 g by mouth in the morning.   Yes [provider]  pravastatin  (PRAVACHOL ) 80 MG tablet Take 80 mg by mouth at bedtime.   Yes [provider]  tiZANidine (ZANAFLEX) 4 MG tablet Take 4 mg by mouth every 8 (eight) hours as needed for muscle spasms.   Yes [provider]  vitamin B-12 (CYANOCOBALAMIN ) 1000 MCG tablet Take 1,000 mcg by mouth in the morning.   Yes [provider]    Inpatient Medications: Scheduled Meds:  hydrocortisone  25 mg Rectal BID   Continuous Infusions:  PRN Meds:   Allergies:   No Known Allergies  Social History:   Social History   Socioeconomic History   Marital status: Widowed    Spouse name: Not on file   Number of children: 3   Years of education: Not on file   Highest education level: Not on file  Occupational History   Not on file  Tobacco Use    Smoking status: Never   Smokeless tobacco: Never  Vaping Use   Vaping status: Never Used  Substance and Sexual Activity   Alcohol use: Not Currently   Drug use: Never   Sexual activity: Not on file  Other Topics Concern   Not on file  Social History Narrative   Not on file   Social Drivers of Health   Financial Resource Strain: Not on file  Food Insecurity: Not on file  Transportation Needs: Not on file  Physical Activity: Not on file  Stress: Not on file  Social Connections: Not on file  Intimate Partner Violence: Not on file    Family History:   Family History  Problem Relation Age of Onset   Hypertension Mother    Stroke Mother        died at age 68   CAD Father        MI at age of 69   Breast cancer Daughter    Sleep apnea Son      ROS:  Please see the history of present illness.   All other ROS reviewed and negative.     Physical Exam/Data:   Vitals:   04/20/24 1356 04/20/24 1359  BP:  123/84  Pulse:  (!) 57  Resp:  17  Temp:  97.9 F (36.6 C)  TempSrc:  Oral  SpO2: 100% 96%  Weight: 116.6 kg   Height: 5\' 7"  (1.702 m)    No intake or output data in the 24 hours ending 04/20/24 1750 Filed Weights   04/20/24 1356  Weight: 116.6 kg   Body mass index is 40.25 kg/m.  GENERAL:  Chronically ill appearing HEENT:   Pupils equal round and reactive, fundi not visualized, oral mucosa unremarkable NECK:    Mild  jugular venous distention, waveform within normal limits, carotid upstroke brisk and symmetric, no bruits, no thyromegaly LYMPHATICS:  No cervical, inguinal adenopathy LUNGS:  Basilar crackles  BACK:  No CVA tenderness CHEST:   Unremarkable HEART:  PMI not displaced or sustained,S1 and S2 within normal limits, no S3, no S4, no clicks, no rubs, no murmurs ABD:  Flat, positive bowel sounds normal in frequency in pitch, no bruits, no rebound, no guarding, no midline pulsatile mass, no hepatomegaly, no splenomegaly EXT:  2 plus pulses throughout,  severe leg edema, no cyanosis no clubbing SKIN:  No rashes no nodules NEURO:   Cranial nerves II through XII grossly intact, motor grossly intact throughout PSYCH:    Cognitively intact, oriented to person place and time   EKG:  The EKG was personally reviewed and demonstrates:   AV paced Telemetry:  Telemetry was personally reviewed and demonstrates:    Relevant CV Studies: NA  Laboratory Data:  Chemistry Recent Labs  Lab 04/20/24 1423 04/20/24 1430  NA 137 137  K 3.5 3.5  CL 97* 100  CO2 21*  --   GLUCOSE 226* 222*  BUN 46* 40*  CREATININE 3.16* 3.50*  CALCIUM 10.2  --   GFRNONAA 14*  --   ANIONGAP 19*  --     Recent Labs  Lab 04/20/24 1423  PROT 8.5*  ALBUMIN 2.9*  AST 62*  ALT 25  ALKPHOS 70  BILITOT 1.0   Hematology Recent Labs  Lab 04/20/24 1423 04/20/24 1430  WBC 5.6  --   RBC 2.59*  --   HGB 8.5* 9.2*  HCT 27.6* 27.0*  MCV 106.6*  --   MCH 32.8  --   MCHC 30.8  --   RDW 17.9*  --   PLT 222  --    Cardiac EnzymesNo results for input(s): "TROPONINI" in the last 168 hours. No results for input(s): "TROPIPOC" in the last 168 hours.  BNP Recent Labs  Lab 04/20/24 1423  BNP 2,719.9*    DDimer No results for input(s): "DDIMER" in the last 168 hours.  Radiology/Studies:  DG Chest Port 1 View Result Date: 04/20/2024 CLINICAL DATA:  sob and leg swelling EXAM: PORTABLE CHEST - 1 VIEW COMPARISON:  March 19, 2024 FINDINGS: Central pulmonary vascular congestion. No focal airspace consolidation, pleural effusion, or pneumothorax. Unchanged cardiomegaly. Left chest pacemaker with leads terminating in the right atrium and right ventricle. No acute fracture or destructive lesion. Multilevel thoracic osteophytosis. Surgical clips in the left neck. Osteopenia. IMPRESSION: Unchanged cardiomegaly with findings of central pulmonary vascular congestion. Electronically Signed   By: Rance Burrows M.D.   On: 04/20/2024 15:44    Assessment and Plan:   PPM: She had  this interrogated last month.  She had normal function.  She has 0% A-fib burden.  No change in therapy.  Acute on chronic renal insufficiency: Nephrology will need to be involved.  She has had some increased diuretic ordered and I cannot verify the dose she has been getting.   Some of this is likely related to her new anemia plus her diuretic.  I will hold her Jardiance and metformin .  If her hemoglobin drops below 8 I have a lower threshold to transfuse her.   Persistent Atrial Fibrillation: For now I would hold her Eliquis .  She has been in sinus rhythm.  She needs to have her anemia worked up.  Continue the amiodarone .    Hypertension :   Blood pressure is controlled.  She can continue her Cardizem .  Continue metoprolol .  CAD: There is no evidence of active ischemia.  The troponin is probably nonspecific in this situation.  She is not having active chest pain or acute EKG changes.  Continue statin.   HFpEF: She has lower extremity swelling but no evidence of pulmonary edema or hypoxemia.  I think we need to try to diurese with continued IV Lasix  40 mg bid and watch the direction of her renal function.   Will keep her feet elevated.  She will need to have thigh high compression stockings.  Anemia: She apparently has had some rectal bleeding and GI has been consulted.   For questions or updates, please contact [facility] Please consult [email or web address] for contact info under Cardiology/STEMI.   Signed, Eilleen Grates, MD  04/20/2024 5:50 PM

## 2024-04-20 NOTE — Telephone Encounter (Signed)
 Patient requested to be seen today for the swelling. Patient has been added to the schedule with PA Michaelle Adolphus for this morning at 11 AM.

## 2024-04-20 NOTE — Telephone Encounter (Signed)
 Left voicemail to return call to office

## 2024-04-20 NOTE — ED Provider Notes (Signed)
 [facility] [facility] Provider Note   CSN: [id number] Arrival date & time: 04/20/24  1345     History  Chief Complaint  Patient presents with   Leg Swelling    Kirsten Jensen is a 89 y.o. female.  89 yo F with a chief complaints of progressive lower extremity edema.  Tells me that she has had trouble with her leg swelling for some time but is gotten progressively worse over the past month or so.  To the point where she can no longer get up and walk on her own.  Has also not been able to fit in chairs that she typically would fit in before.  She had called her cardiologist multiple times to try to set up an appointment and was encouraged to come to the emergency department for evaluation.  She had had some shortness of breath she thinks a couple weeks ago but tells me that it is resolved.        Home Medications Prior to Admission medications   Medication Sig Start Date End Date Taking? Authorizing Provider  acetaminophen  (TYLENOL ) 500 MG tablet Take 500 mg by mouth every 6 (six) hours as needed for mild pain (pain score 1-3).    [provider]  amiodarone  (PACERONE ) 200 MG tablet Take 1 tablet (200 mg total) by mouth daily. 11/22/23   Ursuy, Renee Lynn, PA-C  apixaban  (ELIQUIS ) 2.5 MG TABS tablet Take 1 tablet (2.5 mg total) by mouth 2 (two) times daily. 03/19/24   Debbie Fails, PA-C  ascorbic acid  (VITAMIN C) 500 MG tablet Take 1 tablet (500 mg total) by mouth daily. 03/27/21   Claretta Croft, MD  buPROPion  (WELLBUTRIN ) 100 MG tablet Take 100 mg by mouth daily.    [provider]  calcium carbonate (OSCAL) 1500 (600 Ca) MG TABS tablet Take 600 mg of elemental calcium by mouth 2 (two) times daily.    [provider]  cholecalciferol  (VITAMIN D3) 25 MCG (1000 UNIT) tablet Take 1,000 Units by mouth daily.    [provider]  citalopram (CELEXA) 10 MG tablet Take 10 mg by mouth daily. 02/15/24   [provider]  diltiazem  (CARDIZEM  SR) 120 MG 12 hr capsule Take 1 capsule (120 mg total) by mouth daily. 03/19/24   Debbie Fails, PA-C  ferrous sulfate  325 (65 FE) MG tablet Take 1 tablet (325 mg total) by mouth daily with breakfast. Patient taking differently: Take 325 mg by mouth daily. 11/05/20 11/08/23  Macdonald Savoy, MD  fluticasone (FLONASE) 50 MCG/ACT nasal spray Place 1 spray into both nostrils daily.    [provider]  furosemide  (LASIX ) 40 MG tablet Take one tablet by mouth daily, except on Tuesdays and Thursdays, take 60 mg. 04/11/24   Thomasena Fleming, NP  gemfibrozil  (LOPID ) 600 MG tablet Take 600 mg by mouth 2 (two) times daily. 07/04/22   [provider]  HYDROcodone -acetaminophen  (NORCO/VICODIN) 5-325 MG tablet Take 1 tablet by mouth every 8 (eight) hours as needed for moderate pain (pain score 4-6). 09/08/22   [provider]  JARDIANCE 10 MG TABS tablet Take 10 mg by mouth daily. 04/23/22   [provider]  levothyroxine  (SYNTHROID ) 50 MCG tablet Take 50 mcg by mouth daily before breakfast.    [provider]  magnesium  oxide (MAG-OX) 400 MG tablet Take 1 tablet (400 mg total) by mouth daily. 02/05/20   Fenton, Clint R, PA  metFORMIN  (GLUCOPHAGE ) 500  MG tablet Take 500 mg by mouth daily.    [provider]  metoprolol  succinate (TOPROL  XL) 25 MG 24 hr tablet Take 1 tablet (25 mg total) by mouth daily. 06/02/22   Verona Goodwill, MD  Multiple Vitamins-Minerals (OCUVITE ADULT 50+ PO) Take 1 capsule by mouth daily.    [provider]  pantoprazole  (PROTONIX ) 40 MG tablet Take 1 tablet (40 mg total) by mouth 2 (two) times daily before a meal. 11/05/20   Macdonald Savoy, MD  polyethylene glycol powder (GLYCOLAX /MIRALAX ) 17 GM/SCOOP powder Take 17 g by mouth daily.    [provider]  pravastatin  (PRAVACHOL ) 80 MG tablet Take 80 mg by mouth daily.    [provider]  ramipril  (ALTACE ) 5 MG capsule  Take 1 capsule by mouth daily.    [provider]  sodium fluoride (FLUORISHIELD) 1.1 % GEL dental gel Take by mouth. 01/26/24   [provider]  tiZANidine (ZANAFLEX) 4 MG tablet Take 4 mg by mouth every 8 (eight) hours as needed for muscle spasms.    [provider]  vitamin B-12 (CYANOCOBALAMIN ) 1000 MCG tablet Take 1,000 mcg by mouth daily.    [provider]      Allergies    Patient has no known allergies.    Review of Systems   Review of Systems  Physical Exam Updated Vital Signs BP 123/84 (BP Location: Right Wrist)   Pulse (!) 57   Temp 97.9 F (36.6 C) (Oral)   Resp 17   Ht 5\' 7"  (1.702 m)   Wt 116.6 kg   LMP  (LMP Unknown)   SpO2 96%   BMI 40.25 kg/m  Physical Exam Vitals and nursing note reviewed.  Constitutional:      General: She is not in acute distress.    Appearance: She is well-developed. She is not diaphoretic.  HENT:     Head: Normocephalic and atraumatic.  Eyes:     Pupils: Pupils are equal, round, and reactive to light.  Cardiovascular:     Rate and Rhythm: Normal rate and regular rhythm.     Heart sounds: No murmur heard.    No friction rub. No gallop.  Pulmonary:     Effort: Pulmonary effort is normal.     Breath sounds: No wheezing or rales.  Abdominal:     General: There is no distension.     Palpations: Abdomen is soft.     Tenderness: There is no abdominal tenderness.  Musculoskeletal:        General: No tenderness.     Cervical back: Normal range of motion and neck supple.     Right lower leg: Edema present.     Left lower leg: Edema present.     Comments: 4+ edema up to the lower abdomen  Skin:    General: Skin is warm and dry.  Neurological:     Mental Status: She is alert and oriented to person, place, and time.  Psychiatric:        Behavior: Behavior normal.     ED Results / Procedures / Treatments   Labs (all labs ordered are listed, but only abnormal results are displayed) Labs Reviewed   CBC WITH DIFFERENTIAL/PLATELET - Abnormal; Notable for the following components:      Result Value   RBC 2.59 (*)    Hemoglobin 8.5 (*)    HCT 27.6 (*)    MCV 106.6 (*)    RDW 17.9 (*)  All other components within normal limits  I-STAT CHEM 8, ED - Abnormal; Notable for the following components:   BUN 40 (*)    Creatinine, Ser 3.50 (*)    Glucose, Bld 222 (*)    Hemoglobin 9.2 (*)    HCT 27.0 (*)    All other components within normal limits  TROPONIN I (HIGH SENSITIVITY) - Abnormal; Notable for the following components:   Troponin I (High Sensitivity) 123 (*)    All other components within normal limits  COMPREHENSIVE METABOLIC PANEL WITH GFR  BRAIN NATRIURETIC PEPTIDE    EKG None  Radiology No results found.  Procedures Procedures    Medications Ordered in ED Medications  furosemide  (LASIX ) injection 40 mg (has no administration in time range)  pantoprazole  (PROTONIX ) injection 40 mg (has no administration in time range)    ED Course/ Medical Decision Making/ A&P                                 Medical Decision Making Amount and/or Complexity of Data Reviewed Labs: ordered. Radiology: ordered.  Risk Prescription drug management.   89 yo F with a chief complaints of bilateral lower extremity edema up to the lower abdomen.  Had has been going on for at least a month.  Patient is now unable to ambulate.  Likely needs prolonged course of diuretics in the hospital.  AKI, acute anemia from last check.   I discussed the results with the patient.  She tells me that she has had some rectal bleeding off and on for the past few weeks.  She thinks is due to hemorrhoids.  Mixed with dark stool.  Secure chat message sent to [facility] per hospital protocol.  Troponin 123.  Hard to know if this is downward trending from her chest pain that she had over a week ago or if this is elevated due to her renal dysfunction and being fluid overloaded.  Discussed with cardiology  who will formally consult.  The patients results and plan were reviewed and discussed.   Any x-rays performed were independently reviewed by myself.   Differential diagnosis were considered with the presenting HPI.  Medications  furosemide  (LASIX ) injection 40 mg (has no administration in time range)  pantoprazole  (PROTONIX ) injection 40 mg (has no administration in time range)    Vitals:   04/20/24 1356 04/20/24 1359  BP:  123/84  Pulse:  (!) 57  Resp:  17  Temp:  97.9 F (36.6 C)  TempSrc:  Oral  SpO2: 100% 96%  Weight: 116.6 kg   Height: 5\' 7"  (1.702 m)     Final diagnoses:  Peripheral edema  AKI (acute kidney injury) (HCC)    Admission/ observation were discussed with the admitting physician, patient and/or family and they are comfortable with the plan.            Final Clinical Impression(s) / ED Diagnoses Final diagnoses:  Peripheral edema  AKI (acute kidney injury) [facility])    Rx / DC Orders ED Discharge Orders     None         Albertus Hughs, DO 04/20/24 1532

## 2024-04-20 NOTE — H&P (Signed)
 History and Physical    Kirsten Jensen ZOX:[id number] DOB: [birth year] DOA: 04/20/2024  PCP: Tena Feeling, MD  Patient coming from: Assisted living facility.  Patient lives at [facility].  Patient was ambulatory with support at baseline but recently using wheelchair.  I have personally briefly reviewed patient's old medical records available.   Chief Complaint: Leg swelling, weakness  HPI: Kirsten Jensen is a 89 y.o. female with medical history significant of extensive cardiac history including paroxysmal A-fib, sick sinus syndrome status post pacemaker, type 2 diabetes on metformin , coronary artery disease status post stenting, morbid obesity, hypertension and dyslipidemia along with CKD stage IIIb with baseline creatinine about 1.6 presents to the emergency room with more than a month of gradual worsening leg swelling, weakness and shortness of breath to the point that she is gradually been confined to wheelchair to go to the cafeteria from her room.  She has poor appetite.  Denies any chest pain cough congestion.  Denies any dizziness lightheadedness.  She takes Lasix  3 times a week.  Legs are too heavy to lift up.  She also noted bloating in her abdomen.  Urinating normally as per patient. Denies any fever chills URI symptoms.  Denies any recent change in medications.  Denies any orthopnea or PND.  Weight has not been checked.  She was trying to get into cardiology office but they asked her to go to the emergency room. Patient does have history of hemorrhoids, she has been wiping blood from her hemorrhoids for last 2 months.  She is also on oral iron. ED Course: On room air.  Blood pressures are stable.  Hemoglobin 9 which is 2 point less than her baseline. Troponin 123. Creatinine 3.5 with few months ago creatinine of 1.6. BNP 2719. Chest x-ray with chronic cardiomegaly, prominent vascular markings. Admitted for fluid overload, patient received 40 mg of IV Lasix  in the emergency room.   Cardiology also consulted.  GI also notified. Echocardiogram from 10/10/2023 with ejection fraction 60 to 65%.  Review of Systems: all systems are reviewed and pertinent positive as per HPI otherwise rest are negative.    Past Medical History:  Diagnosis Date   AF (paroxysmal atrial fibrillation) (HCC) 03/25/2021   Atrial fibrillation (HCC)    Coronary artery disease    stenting x 2   Diabetes (HCC)    Dyslipidemia 04/05/2020   Hypertension    Morbid obesity (HCC)    Pacemaker    Paroxysmal atrial fibrillation (HCC) 11/06/2019   Secondary hypercoagulable state (HCC) 11/06/2019   T2DM (type 2 diabetes mellitus) (HCC) 04/05/2020   Tachycardia-bradycardia syndrome (HCC) 03/17/2020    Past Surgical History:  Procedure Laterality Date   CARDIOVERSION N/A 10/10/2023   Procedure: CARDIOVERSION;  Surgeon: Hazle Lites, MD;  Location: [facility] INVASIVE CV LAB;  Service: Cardiovascular;  Laterality: N/A;   COLONOSCOPY N/A 11/02/2020   Procedure: COLONOSCOPY;  Surgeon: Ozell Blunt, MD;  Location[facility] ENDOSCOPY;  Service: Endoscopy;  Laterality: N/A;   ESOPHAGOGASTRODUODENOSCOPY N/A 11/01/2020   Procedure: ESOPHAGOGASTRODUODENOSCOPY (EGD);  Surgeon: Ozell Blunt, MD;  Location: [facility] ENDOSCOPY;  Service: Endoscopy;  Laterality: N/A;   TRANSESOPHAGEAL ECHOCARDIOGRAM (CATH LAB)  10/10/2023   Procedure: TRANSESOPHAGEAL ECHOCARDIOGRAM;  Surgeon: Hazle Lites, MD;  Location: [facility] INVASIVE CV LAB;  Service: Cardiovascular;;    Social history   reports that she has never smoked. She has never used smokeless tobacco. She reports that she does not currently use alcohol. She reports that she does not use drugs.  No Known Allergies  Family History  Problem Relation Age of Onset   Hypertension Mother    Stroke Mother        died at age 51   CAD Father        MI at age of 10   Breast cancer Daughter    Sleep apnea Son      Prior to Admission medications   Medication Sig Start Date End Date Taking?  Authorizing Provider  acetaminophen  (TYLENOL ) 500 MG tablet Take 500 mg by mouth every 6 (six) hours as needed for mild pain (pain score 1-3).    [provider]  amiodarone  (PACERONE ) 200 MG tablet Take 1 tablet (200 mg total) by mouth daily. 11/22/23   Debbie Fails, PA-C  apixaban  (ELIQUIS ) 2.5 MG TABS tablet Take 1 tablet (2.5 mg total) by mouth 2 (two) times daily. 03/19/24   Debbie Fails, PA-C  ascorbic acid  (VITAMIN C) 500 MG tablet Take 1 tablet (500 mg total) by mouth daily. 03/27/21   Claretta Croft, MD  buPROPion  (WELLBUTRIN ) 100 MG tablet Take 100 mg by mouth daily.    [provider]  calcium carbonate (OSCAL) 1500 (600 Ca) MG TABS tablet Take 600 mg of elemental calcium by mouth 2 (two) times daily.    [provider]  cholecalciferol  (VITAMIN D3) 25 MCG (1000 UNIT) tablet Take 1,000 Units by mouth daily.    [provider]  citalopram (CELEXA) 10 MG tablet Take 10 mg by mouth daily. 02/15/24   [provider]  diltiazem  (CARDIZEM  SR) 120 MG 12 hr capsule Take 1 capsule (120 mg total) by mouth daily. 03/19/24   Debbie Fails, PA-C  ferrous sulfate  325 (65 FE) MG tablet Take 1 tablet (325 mg total) by mouth daily with breakfast. Patient taking differently: Take 325 mg by mouth daily. 11/05/20 04/20/24  Macdonald Savoy, MD  fluticasone (FLONASE) 50 MCG/ACT nasal spray Place 1 spray into both nostrils daily.    [provider]  furosemide  (LASIX ) 40 MG tablet Take one tablet by mouth daily, except on Tuesdays and Thursdays, take 60 mg. 04/11/24   Thomasena Fleming, NP  gemfibrozil  (LOPID ) 600 MG tablet Take 600 mg by mouth 2 (two) times daily. 07/04/22   [provider]  HYDROcodone -acetaminophen  (NORCO/VICODIN) 5-325 MG tablet Take 1 tablet by mouth every 8 (eight) hours as needed for moderate pain (pain score 4-6). 09/08/22   [provider]  JARDIANCE 10 MG TABS tablet Take 10 mg by mouth daily. 04/23/22    [provider]  levothyroxine  (SYNTHROID ) 50 MCG tablet Take 50 mcg by mouth daily before breakfast.    [provider]  magnesium  oxide (MAG-OX) 400 MG tablet Take 1 tablet (400 mg total) by mouth daily. 02/05/20   Fenton, Clint R, PA  metFORMIN  (GLUCOPHAGE ) 500 MG tablet Take 500 mg by mouth daily.    [provider]  metoprolol  succinate (TOPROL  XL) 25 MG 24 hr tablet Take 1 tablet (25 mg total) by mouth daily. 06/02/22   Verona Goodwill, MD  Multiple Vitamins-Minerals (OCUVITE ADULT 50+ PO) Take 1 capsule by mouth daily.    [provider]  pantoprazole  (PROTONIX ) 40 MG tablet Take 1 tablet (40 mg total) by mouth 2 (two) times daily before a meal. 11/05/20   Macdonald Savoy, MD  polyethylene glycol powder (GLYCOLAX /MIRALAX ) 17 GM/SCOOP powder Take 17 g by mouth daily.    [provider]  pravastatin  (PRAVACHOL ) 80  MG tablet Take 80 mg by mouth daily.    [provider]  ramipril  (ALTACE ) 5 MG capsule Take 1 capsule by mouth daily.    [provider]  sodium fluoride (FLUORISHIELD) 1.1 % GEL dental gel Take by mouth. 01/26/24   [provider]  tiZANidine (ZANAFLEX) 4 MG tablet Take 4 mg by mouth every 8 (eight) hours as needed for muscle spasms.    [provider]  vitamin B-12 (CYANOCOBALAMIN ) 1000 MCG tablet Take 1,000 mcg by mouth daily.    [provider]    Physical Exam: Vitals:   04/20/24 1356 04/20/24 1359  BP:  123/84  Pulse:  (!) 57  Resp:  17  Temp:  97.9 F (36.6 C)  TempSrc:  Oral  SpO2: 100% 96%  Weight: 116.6 kg   Height: 5\' 7"  (1.702 m)     Constitutional: NAD, calm, comfortable.  Pleasant and interactive.  On room air. Vitals:   04/20/24 1356 04/20/24 1359  BP:  123/84  Pulse:  (!) 57  Resp:  17  Temp:  97.9 F (36.6 C)  TempSrc:  Oral  SpO2: 100% 96%  Weight: 116.6 kg   Height: 5\' 7"  (1.702 m)    Eyes: PERRL, lids and conjunctivae normal.  ENMT: Mucous  membranes are moist. Posterior pharynx clear of any exudate or lesions.Normal dentition.  Neck: normal, supple, no masses, no thyromegaly Respiratory: clear to auscultation bilaterally, no wheezing, no crackles. Normal respiratory effort. No accessory muscle use.  Difficult to assess, unable to hear any abnormal sounds. Cardiovascular: Regular rate and rhythm, no murmurs / rubs / gallops.  3+ edema bilateral leg.  Nontender.  Edema up to the knees.   Jugular vein, not distended. Abdomen: no tenderness, no masses palpated. No hepatosplenomegaly. Bowel sounds positive.  Obese and pendulous. Musculoskeletal: no clubbing / cyanosis. No joint deformity upper and lower extremities. Good ROM, no contractures. Normal muscle tone.  Skin: no rashes, lesions, ulcers. No induration Neurologic: CN 2-12 grossly intact. Sensation intact, DTR normal. Strength 5/5 in all 4.  Psychiatric: Normal judgment and insight. Alert and oriented x 3. Normal mood.     Labs on Admission: I have personally reviewed following labs and imaging studies  CBC: Recent Labs  Lab 04/20/24 1423 04/20/24 1430  WBC 5.6  --   NEUTROABS 4.2  --   HGB 8.5* 9.2*  HCT 27.6* 27.0*  MCV 106.6*  --   PLT 222  --    Basic Metabolic Panel: Recent Labs  Lab 04/20/24 1423 04/20/24 1430  NA 137 137  K 3.5 3.5  CL 97* 100  CO2 21*  --   GLUCOSE 226* 222*  BUN 46* 40*  CREATININE 3.16* 3.50*  CALCIUM 10.2  --    GFR: Estimated Creatinine Clearance: 14.4 mL/min (A) (by C-G formula based on SCr of 3.5 mg/dL (H)). Liver Function Tests: Recent Labs  Lab 04/20/24 1423  AST 62*  ALT 25  ALKPHOS 70  BILITOT 1.0  PROT 8.5*  ALBUMIN 2.9*   No results for input(s): "LIPASE", "AMYLASE" in the last 168 hours. No results for input(s): "AMMONIA" in the last 168 hours. Coagulation Profile: No results for input(s): "INR", "PROTIME" in the last 168 hours. Cardiac Enzymes: No results for input(s): "CKTOTAL", "CKMB", "CKMBINDEX",  "TROPONINI" in the last 168 hours. BNP (last 3 results) No results for input(s): "PROBNP" in the last 8760 hours. HbA1C: No results for input(s): "HGBA1C" in the last 72 hours. CBG: No results for  input(s): "GLUCAP" in the last 168 hours. Lipid Profile: No results for input(s): "CHOL", "HDL", "LDLCALC", "TRIG", "CHOLHDL", "LDLDIRECT" in the last 72 hours. Thyroid  Function Tests: No results for input(s): "TSH", "T4TOTAL", "FREET4", "T3FREE", "THYROIDAB" in the last 72 hours. Anemia Panel: No results for input(s): "VITAMINB12", "FOLATE", "FERRITIN", "TIBC", "IRON", "RETICCTPCT" in the last 72 hours. Urine analysis:    Component Value Date/Time   COLORURINE YELLOW 03/24/2021 0630   APPEARANCEUR HAZY (A) 03/24/2021 0630   LABSPEC 1.009 03/24/2021 0630   PHURINE 5.0 03/24/2021 0630   GLUCOSEU NEGATIVE 03/24/2021 0630   HGBUR MODERATE (A) 03/24/2021 0630   BILIRUBINUR NEGATIVE 03/24/2021 0630   KETONESUR NEGATIVE 03/24/2021 0630   PROTEINUR NEGATIVE 03/24/2021 0630   NITRITE NEGATIVE 03/24/2021 0630   LEUKOCYTESUR LARGE (A) 03/24/2021 0630    Radiological Exams on Admission: DG Chest Port 1 View Result Date: 04/20/2024 CLINICAL DATA:  sob and leg swelling EXAM: PORTABLE CHEST - 1 VIEW COMPARISON:  March 19, 2024 FINDINGS: Central pulmonary vascular congestion. No focal airspace consolidation, pleural effusion, or pneumothorax. Unchanged cardiomegaly. Left chest pacemaker with leads terminating in the right atrium and right ventricle. No acute fracture or destructive lesion. Multilevel thoracic osteophytosis. Surgical clips in the left neck. Osteopenia. IMPRESSION: Unchanged cardiomegaly with findings of central pulmonary vascular congestion. Electronically Signed   By: Rance Burrows M.D.   On: 04/20/2024 15:44    EKG: Independently reviewed.  Right bundle branch block, .  Paced rhythm.  Comparable to previous EKG. Assessment/Plan Principal Problem:   CHF exacerbation (HCC) Active  Problems:   Paroxysmal atrial fibrillation (HCC)   Tachycardia-bradycardia syndrome (HCC)   Pacemaker   HTN (hypertension)   Dyslipidemia   Morbid obesity with BMI of 45.0-49.9, adult (HCC)   Gastrointestinal hemorrhage   Acute renal failure superimposed on stage 3b chronic kidney disease (HCC)   Hypothyroidism     Acute on chronic diastolic heart failure: Presented with cardiomegaly, leg edema, increasing shortness of breath, fatigue and tiredness.  BNP is elevated. Admit to cardiac monitored unit. Given Lasix  40 mg IV x 1.  Cardiology is consulting, will defer further medications to them.   Resume home dose of Toprol  XL 25 mg daily. Cardizem  120 mg every 12 hours. Holding ramipril  due to AKI. Further diuretic dosing as per cardiology consult. Repeat echocardiogram today, last echocardiogram was more than 6 months ago.   2.  AKI on CKD stage IIIb: Probably cardiorenal syndrome.   Recent baseline creatinine 1.6.  Creatinine one 3.5. Strict intake output monitoring.  Close monitoring on diuretics.  If further worsening, may need dialysis.  Recheck tomorrow morning.  Potassium is adequate. Holding ramipril  and metformin .  Also holding Jardiance.  3.  Acute on chronic iron deficiency anemia: Patient with known hemorrhoids and currently bleeding. EGD, colonoscopy reviewed from 2021.  She has internal and external hemorrhoids. Will start patient on a steroid suppositories.  4.  Chronic A-fib, tachybradycardia syndrome status post pacemaker: Currently stable.  Rate is controlled.  Resume metoprolol  and Cardizem .  Will hold Eliquis  tonight to monitor rectal bleeding and hemoglobin.  5.  Type 2 diabetes: Well-controlled.  On metformin .  Discontinuing.  SSI.  6.  Hypertension: As above.  Hold ramipril .  On Cardizem  and metoprolol .  7.  Hypothyroidism: On Synthroid .   DVT prophylaxis: Heparin subcu, holding Eliquis  tonight Code Status: Full code.  Discussed with patient Family  Communication: None at the bedside Disposition Plan: Back to assisted living facility Consults called: Cardiology, [facility] by ER physician  Admission status: Inpatient.  Cardiac monitor.   Vada Garibaldi MD [facility]

## 2024-04-21 ENCOUNTER — Inpatient Hospital Stay (HOSPITAL_COMMUNITY)

## 2024-04-21 ENCOUNTER — Encounter (HOSPITAL_COMMUNITY): Payer: Self-pay | Admitting: Internal Medicine

## 2024-04-21 DIAGNOSIS — N1832 Chronic kidney disease, stage 3b: Secondary | ICD-10-CM | POA: Diagnosis not present

## 2024-04-21 DIAGNOSIS — I5031 Acute diastolic (congestive) heart failure: Secondary | ICD-10-CM

## 2024-04-21 DIAGNOSIS — I4891 Unspecified atrial fibrillation: Secondary | ICD-10-CM

## 2024-04-21 DIAGNOSIS — I495 Sick sinus syndrome: Secondary | ICD-10-CM

## 2024-04-21 DIAGNOSIS — N179 Acute kidney failure, unspecified: Secondary | ICD-10-CM | POA: Diagnosis not present

## 2024-04-21 DIAGNOSIS — N170 Acute kidney failure with tubular necrosis: Secondary | ICD-10-CM | POA: Diagnosis not present

## 2024-04-21 DIAGNOSIS — I10 Essential (primary) hypertension: Secondary | ICD-10-CM | POA: Diagnosis not present

## 2024-04-21 DIAGNOSIS — I5033 Acute on chronic diastolic (congestive) heart failure: Secondary | ICD-10-CM

## 2024-04-21 LAB — GLUCOSE, CAPILLARY
Glucose-Capillary: 195 mg/dL — ABNORMAL HIGH (ref 70–99)
Glucose-Capillary: 170 mg/dL — ABNORMAL HIGH (ref 70–99)
Glucose-Capillary: 185 mg/dL — ABNORMAL HIGH (ref 70–99)

## 2024-04-21 LAB — BASIC METABOLIC PANEL WITH GFR
Anion gap: 14 (ref 5–15)
BUN: 42 mg/dL — ABNORMAL HIGH (ref 8–23)
CO2: 23 mmol/L (ref 22–32)
Calcium: 10 mg/dL (ref 8.9–10.3)
Chloride: 99 mmol/L (ref 98–111)
Creatinine, Ser: 2.95 mg/dL — ABNORMAL HIGH (ref 0.44–1.00)
GFR, Estimated: 15 mL/min — ABNORMAL LOW (ref >60)
Glucose, Bld: 200 mg/dL — ABNORMAL HIGH (ref 70–99)
Potassium: 3.4 mmol/L — ABNORMAL LOW (ref 3.5–5.1)
Sodium: 136 mmol/L (ref 135–145)

## 2024-04-21 LAB — IRON AND TIBC
Iron: 41 ug/dL (ref 28–170)
Saturation Ratios: 12 % (ref 10.4–31.8)
TIBC: 354 ug/dL (ref 250–450)
UIBC: 313 ug/dL

## 2024-04-21 LAB — ECHOCARDIOGRAM COMPLETE
AR max vel: 2.61 cm2
AV Peak grad: 5.9 mmHg
Ao pk vel: 1.21 m/s
Area-P 1/2: 3.65 cm2
Height: 67 in
S' Lateral: 3 cm
Weight: 4186.98 [oz_av]

## 2024-04-21 LAB — CBC
HCT: 26.5 % — ABNORMAL LOW (ref 36.0–46.0)
Hemoglobin: 8.4 g/dL — ABNORMAL LOW (ref 12.0–15.0)
MCH: 33.1 pg (ref 26.0–34.0)
MCHC: 31.7 g/dL (ref 30.0–36.0)
MCV: 104.3 fL — ABNORMAL HIGH (ref 80.0–100.0)
Platelets: 228 10*3/uL (ref 150–400)
RBC: 2.54 MIL/uL — ABNORMAL LOW (ref 3.87–5.11)
RDW: 17.9 % — ABNORMAL HIGH (ref 11.5–15.5)
WBC: 5.5 10*3/uL (ref 4.0–10.5)
nRBC: 0 % (ref 0.0–0.2)

## 2024-04-21 LAB — FERRITIN: Ferritin: 85 ng/mL (ref 11–307)

## 2024-04-21 MED ORDER — METOPROLOL SUCCINATE ER 25 MG PO TB24
25.0000 mg | ORAL_TABLET | Freq: Every day | ORAL | Status: DC
  Administered 2024-04-21 – 2024-04-24 (×4): 25 mg via ORAL
  Filled 2024-04-21 (×4): qty 1

## 2024-04-21 MED ORDER — FERROUS SULFATE 325 (65 FE) MG PO TABS
325.0000 mg | ORAL_TABLET | Freq: Every day | ORAL | Status: DC
  Administered 2024-04-21 – 2024-04-24 (×4): 325 mg via ORAL
  Filled 2024-04-21 (×4): qty 1

## 2024-04-21 MED ORDER — PANTOPRAZOLE SODIUM 40 MG PO TBEC
40.0000 mg | DELAYED_RELEASE_TABLET | Freq: Two times a day (BID) | ORAL | Status: DC
  Administered 2024-04-21 – 2024-04-24 (×7): 40 mg via ORAL
  Filled 2024-04-21 (×7): qty 1

## 2024-04-21 MED ORDER — LEVOTHYROXINE SODIUM 50 MCG PO TABS
50.0000 ug | ORAL_TABLET | Freq: Every day | ORAL | Status: DC
  Administered 2024-04-21 – 2024-04-24 (×4): 50 ug via ORAL
  Filled 2024-04-21 (×4): qty 1

## 2024-04-21 MED ORDER — GEMFIBROZIL 600 MG PO TABS
600.0000 mg | ORAL_TABLET | Freq: Two times a day (BID) | ORAL | Status: DC
  Administered 2024-04-21 – 2024-04-24 (×7): 600 mg via ORAL
  Filled 2024-04-21 (×10): qty 1

## 2024-04-21 MED ORDER — PRAVASTATIN SODIUM 40 MG PO TABS
80.0000 mg | ORAL_TABLET | Freq: Every day | ORAL | Status: DC
  Administered 2024-04-21 – 2024-04-24 (×4): 80 mg via ORAL
  Filled 2024-04-21 (×4): qty 2

## 2024-04-21 MED ORDER — HEPARIN SODIUM (PORCINE) 5000 UNIT/ML IJ SOLN
5000.0000 [IU] | Freq: Three times a day (TID) | INTRAMUSCULAR | Status: DC
  Administered 2024-04-21 – 2024-04-22 (×3): 5000 [IU] via SUBCUTANEOUS
  Filled 2024-04-21 (×3): qty 1

## 2024-04-21 MED ORDER — POTASSIUM CHLORIDE CRYS ER 20 MEQ PO TBCR
40.0000 meq | EXTENDED_RELEASE_TABLET | Freq: Every day | ORAL | Status: DC
  Administered 2024-04-21 – 2024-04-23 (×3): 40 meq via ORAL
  Filled 2024-04-21 (×3): qty 2

## 2024-04-21 MED ORDER — ACETAMINOPHEN 325 MG PO TABS
650.0000 mg | ORAL_TABLET | Freq: Four times a day (QID) | ORAL | Status: DC | PRN
  Filled 2024-04-21: qty 2

## 2024-04-21 MED ORDER — POLYETHYLENE GLYCOL 3350 17 G PO PACK
17.0000 g | PACK | Freq: Every day | ORAL | Status: DC
  Administered 2024-04-21 – 2024-04-24 (×4): 17 g via ORAL
  Filled 2024-04-21 (×4): qty 1

## 2024-04-21 MED ORDER — ACETAMINOPHEN 650 MG RE SUPP: 650.0000 mg | Freq: Four times a day (QID) | RECTAL | Status: DC | PRN

## 2024-04-21 MED ORDER — MIDODRINE HCL 5 MG PO TABS
5.0000 mg | ORAL_TABLET | Freq: Three times a day (TID) | ORAL | Status: DC
  Administered 2024-04-21 – 2024-04-22 (×2): 5 mg via ORAL
  Filled 2024-04-21 (×2): qty 1

## 2024-04-21 MED ORDER — DILTIAZEM HCL ER 60 MG PO CP12
120.0000 mg | ORAL_CAPSULE | Freq: Every day | ORAL | Status: DC
  Administered 2024-04-21: 120 mg via ORAL
  Filled 2024-04-21: qty 2

## 2024-04-21 MED ORDER — VITAMIN B-12 1000 MCG PO TABS
1000.0000 ug | ORAL_TABLET | Freq: Every day | ORAL | Status: DC
  Administered 2024-04-21 – 2024-04-24 (×4): 1000 ug via ORAL
  Filled 2024-04-21 (×4): qty 1

## 2024-04-21 MED ORDER — VITAMIN D 25 MCG (1000 UNIT) PO TABS
1000.0000 [IU] | ORAL_TABLET | Freq: Every day | ORAL | Status: DC
  Administered 2024-04-21 – 2024-04-24 (×4): 1000 [IU] via ORAL
  Filled 2024-04-21 (×4): qty 1

## 2024-04-21 MED ORDER — CITALOPRAM HYDROBROMIDE 10 MG PO TABS: 10.0000 mg | ORAL_TABLET | Freq: Every day | ORAL | Status: DC

## 2024-04-21 MED ORDER — BUPROPION HCL 100 MG PO TABS
100.0000 mg | ORAL_TABLET | Freq: Every day | ORAL | Status: DC
  Administered 2024-04-21 – 2024-04-24 (×4): 100 mg via ORAL
  Filled 2024-04-21 (×6): qty 1

## 2024-04-21 MED ORDER — AMIODARONE HCL 200 MG PO TABS
200.0000 mg | ORAL_TABLET | Freq: Every day | ORAL | Status: DC
  Administered 2024-04-21 – 2024-04-24 (×4): 200 mg via ORAL
  Filled 2024-04-21 (×4): qty 1

## 2024-04-21 MED ORDER — INSULIN ASPART 100 UNIT/ML IJ SOLN
0.0000 [IU] | Freq: Three times a day (TID) | INTRAMUSCULAR | Status: DC
  Administered 2024-04-21: 2 [IU] via SUBCUTANEOUS
  Administered 2024-04-22: 3 [IU] via SUBCUTANEOUS
  Administered 2024-04-22: 2 [IU] via SUBCUTANEOUS
  Administered 2024-04-22: 3 [IU] via SUBCUTANEOUS
  Administered 2024-04-23: 5 [IU] via SUBCUTANEOUS
  Administered 2024-04-23: 3 [IU] via SUBCUTANEOUS
  Administered 2024-04-23 – 2024-04-24 (×3): 1 [IU] via SUBCUTANEOUS
  Administered 2024-04-24: 2 [IU] via SUBCUTANEOUS

## 2024-04-21 MED ORDER — CALCIUM CARBONATE 1250 (500 CA) MG PO TABS
1.0000 | ORAL_TABLET | Freq: Two times a day (BID) | ORAL | Status: DC
  Administered 2024-04-21 – 2024-04-24 (×7): 1250 mg via ORAL
  Filled 2024-04-21 (×9): qty 1

## 2024-04-21 MED ORDER — FLUTICASONE PROPIONATE 50 MCG/ACT NA SUSP
1.0000 | Freq: Every day | NASAL | Status: DC
  Administered 2024-04-21 – 2024-04-24 (×2): 1 via NASAL
  Filled 2024-04-21: qty 16

## 2024-04-21 MED ORDER — TIZANIDINE HCL 4 MG PO TABS
4.0000 mg | ORAL_TABLET | Freq: Three times a day (TID) | ORAL | Status: DC | PRN
  Administered 2024-04-22: 4 mg via ORAL
  Filled 2024-04-21 (×3): qty 1

## 2024-04-21 MED ORDER — VITAMIN C 500 MG PO TABS
500.0000 mg | ORAL_TABLET | Freq: Every day | ORAL | Status: DC
  Administered 2024-04-21 – 2024-04-24 (×4): 500 mg via ORAL
  Filled 2024-04-21 (×4): qty 1

## 2024-04-21 MED ORDER — ENSURE ENLIVE PO LIQD
237.0000 mL | Freq: Two times a day (BID) | ORAL | Status: DC
  Administered 2024-04-21 – 2024-04-24 (×4): 237 mL via ORAL

## 2024-04-21 NOTE — Progress Notes (Signed)
 PROGRESS NOTE    Kirsten Jensen  NFA:[id number] DOB: 1931/12/26 DOA: 04/20/2024 PCP: Tena Feeling, MD    Brief Narrative:  89 year old with history of extensive cardiac issues including paroxysmal A-fib, sick sinus syndrome status post pacemaker, chronically on Eliquis , type 2 diabetes on metformin , coronary artery disease status post stenting, morbid obesity, hypertension hyperlipidemia and CKD stage IIIb presented to the emergency room with about 1 month of gradually worsening leg swelling and gradual decrease in mobility.  In the emergency room on room air.  Blood pressure stable.  Hemoglobin 9 with recent hemoglobin of 11.  Creatinine 3.5 with recent creatinine of 1.6.  Chest x-ray with chronic cardiomegaly and prominent bronchovascular markings.  BNP was 2719.  Admitted with fluid overload, AKI and anemia.  Subjective: Patient seen and examined.  At rest she denies any complaints.  She is agreeable to mobility.  No bowel movement since admitted and no rectal bleeding.  Remains sinus rhythm.  Urine output is not very well measured.  Denies any orthopnea or PND or shortness of breath at rest.  Assessment & Plan:   Acute on chronic diastolic heart failure: Presented with cardiomegaly, leg edema, increasing shortness of breath, fatigue and tiredness.  BNP is elevated. Given Lasix  40 mg IV BID.  Cardiology is following.   home dose of Toprol  XL 25 mg daily. Cardizem  120 mg every 12 hours. Holding ramipril  due to AKI. Further diuretic dosing as per cardiology consult. Repeat echocardiogram today, last echocardiogram was more than 6 months ago.    AKI on CKD stage IIIb: Probably cardiorenal syndrome.   Recent baseline creatinine 1.6.  Creatinine 3.5. morning labs pending.  Strict intake output monitoring.  Close monitoring on diuretics.  If further worsening, may need dialysis.  Potassium is adequate. Holding Jardiance, ramipril  and metformin .  Consulting Nephrology    Acute on chronic  iron deficiency anemia: Patient with known hemorrhoids and intermittently bleeding. EGD, colonoscopy reviewed from 2021.  She has internal and external hemorrhoids. Started patient on steroid suppositories.   Chronic A-fib, tachybradycardia syndrome status post pacemaker: Currently stable.  Rate is controlled.  Resume metoprolol  and Cardizem .  Eliquis  on hold.  Resume once rectal bleeding is improved.   Type 2 diabetes: Well-controlled.  On metformin .  Discontinuing.  SSI.   Hypertension: As above.  Hold ramipril .  On Cardizem  and metoprolol .   Hypothyroidism: On Synthroid .   DVT prophylaxis: heparin injection 5,000 Units Start: 04/21/24 1400   Code Status: Full code Family Communication: None at the bedside Disposition Plan: Status is: Inpatient Remains inpatient appropriate because: IV diuretics, renal function monitoring     Consultants:  Cardiology Nephrology  Procedures:  None   Antimicrobials:  None     Objective: Vitals:   04/21/24 0048 04/21/24 0527 04/21/24 0540 04/21/24 0814  BP: (!) 105/53 (!) 110/50 (!) 128/55 (!) 109/47  Pulse: 60 60  60  Resp: 20 20 (!) 9 20  Temp: 97.6 F (36.4 C) 97.6 F (36.4 C)  97.7 F (36.5 C)  TempSrc: Oral Oral  Oral  SpO2: 100% 97%  97%  Weight:  118.7 kg    Height:        Intake/Output Summary (Last 24 hours) at 04/21/2024 1057 Last data filed at 04/21/2024 0830 Gross per 24 hour  Intake 240 ml  Output 300 ml  Net -60 ml   Filed Weights   04/20/24 1356 04/20/24 2200 04/21/24 0527  Weight: 116.6 kg 118.7 kg 118.7 kg    Examination:  General exam: Appears calm and comfortable , on room air.  Respiratory system: Clear to auscultation. Respiratory effort normal. No added sounds  Cardiovascular system: S1 & S2 heard, RRR.  3+ bilateral pedal edema. Gastrointestinal system: Abdomen is nondistended, soft and nontender. No organomegaly or masses felt. Normal bowel sounds heard. Central nervous system: Alert and  oriented. No focal neurological deficits. Extremities: Symmetric 5 x 5 power.  Generalized weakness.    Data Reviewed: I have personally reviewed following labs and imaging studies  CBC: Recent Labs  Lab 04/20/24 1423 04/20/24 1430 04/21/24 0843  WBC 5.6  --  5.5  NEUTROABS 4.2  --   --   HGB 8.5* 9.2* 8.4*  HCT 27.6* 27.0* 26.5*  MCV 106.6*  --  104.3*  PLT 222  --  228   Basic Metabolic Panel: Recent Labs  Lab 04/20/24 1423 04/20/24 1430  NA 137 137  K 3.5 3.5  CL 97* 100  CO2 21*  --   GLUCOSE 226* 222*  BUN 46* 40*  CREATININE 3.16* 3.50*  CALCIUM 10.2  --    GFR: Estimated Creatinine Clearance: 14.5 mL/min (A) (by C-G formula based on SCr of 3.5 mg/dL (H)). Liver Function Tests: Recent Labs  Lab 04/20/24 1423  AST 62*  ALT 25  ALKPHOS 70  BILITOT 1.0  PROT 8.5*  ALBUMIN 2.9*   No results for input(s): "LIPASE", "AMYLASE" in the last 168 hours. No results for input(s): "AMMONIA" in the last 168 hours. Coagulation Profile: No results for input(s): "INR", "PROTIME" in the last 168 hours. Cardiac Enzymes: No results for input(s): "CKTOTAL", "CKMB", "CKMBINDEX", "TROPONINI" in the last 168 hours. BNP (last 3 results) No results for input(s): "PROBNP" in the last 8760 hours. HbA1C: No results for input(s): "HGBA1C" in the last 72 hours. CBG: No results for input(s): "GLUCAP" in the last 168 hours. Lipid Profile: No results for input(s): "CHOL", "HDL", "LDLCALC", "TRIG", "CHOLHDL", "LDLDIRECT" in the last 72 hours. Thyroid  Function Tests: No results for input(s): "TSH", "T4TOTAL", "FREET4", "T3FREE", "THYROIDAB" in the last 72 hours. Anemia Panel: No results for input(s): "VITAMINB12", "FOLATE", "FERRITIN", "TIBC", "IRON", "RETICCTPCT" in the last 72 hours. Sepsis Labs: No results for input(s): "PROCALCITON", "LATICACIDVEN" in the last 168 hours.  No results found for this or any previous visit (from the past 240 hours).       Radiology  Studies: DG Chest Port 1 View Result Date: 04/20/2024 CLINICAL DATA:  sob and leg swelling EXAM: PORTABLE CHEST - 1 VIEW COMPARISON:  March 19, 2024 FINDINGS: Central pulmonary vascular congestion. No focal airspace consolidation, pleural effusion, or pneumothorax. Unchanged cardiomegaly. Left chest pacemaker with leads terminating in the right atrium and right ventricle. No acute fracture or destructive lesion. Multilevel thoracic osteophytosis. Surgical clips in the left neck. Osteopenia. IMPRESSION: Unchanged cardiomegaly with findings of central pulmonary vascular congestion. Electronically Signed   By: Rance Burrows M.D.   On: 04/20/2024 15:44        Scheduled Meds:  amiodarone   200 mg Oral Daily   ascorbic acid   500 mg Oral Daily   buPROPion   100 mg Oral Daily   calcium carbonate  1 tablet Oral BID WC   cholecalciferol   1,000 Units Oral Daily   cyanocobalamin   1,000 mcg Oral Daily   diltiazem   120 mg Oral Daily   feeding supplement  237 mL Oral BID BM   ferrous sulfate   325 mg Oral Daily   fluticasone  1 spray Each Nare Daily   furosemide   40 mg Intravenous BID   gemfibrozil   600 mg Oral BID   heparin  5,000 Units Subcutaneous Q8H   hydrocortisone  25 mg Rectal BID   insulin  aspart  0-9 Units Subcutaneous TID WC   levothyroxine   50 mcg Oral Q0600   metoprolol  succinate  25 mg Oral Daily   pantoprazole   40 mg Oral BID AC   polyethylene glycol  17 g Oral Daily   pravastatin   80 mg Oral q1800   Continuous Infusions:   LOS: 1 day       Mayling Aber, MD [facility]

## 2024-04-21 NOTE — Plan of Care (Signed)

## 2024-04-21 NOTE — Progress Notes (Addendum)
 Progress Note  Patient Name: Kirsten Jensen Date of Encounter: 04/21/2024  Primary Cardiologist: Richardo Chandler, MD   Subjective   Patient seen and examined at her bedside.   Inpatient Medications    Scheduled Meds:  amiodarone   200 mg Oral Daily   ascorbic acid   500 mg Oral Daily   buPROPion   100 mg Oral Daily   calcium carbonate  1 tablet Oral BID WC   cholecalciferol   1,000 Units Oral Daily   cyanocobalamin   1,000 mcg Oral Daily   diltiazem   120 mg Oral Daily   feeding supplement  237 mL Oral BID BM   ferrous sulfate   325 mg Oral Daily   fluticasone  1 spray Each Nare Daily   furosemide   40 mg Intravenous BID   gemfibrozil   600 mg Oral BID   heparin  5,000 Units Subcutaneous Q8H   hydrocortisone  25 mg Rectal BID   insulin  aspart  0-9 Units Subcutaneous TID WC   levothyroxine   50 mcg Oral Q0600   metoprolol  succinate  25 mg Oral Daily   pantoprazole   40 mg Oral BID AC   polyethylene glycol  17 g Oral Daily   pravastatin   80 mg Oral q1800   Continuous Infusions:  PRN Meds: acetaminophen  **OR** acetaminophen , tiZANidine   Vital Signs    Vitals:   04/21/24 0048 04/21/24 0527 04/21/24 0540 04/21/24 0814  BP: (!) 105/53 (!) 110/50 (!) 128/55 (!) 109/47  Pulse: 60 60  60  Resp: 20 20 (!) 9 20  Temp: 97.6 F (36.4 C) 97.6 F (36.4 C)  97.7 F (36.5 C)  TempSrc: Oral Oral  Oral  SpO2: 100% 97%  97%  Weight:  118.7 kg    Height:        Intake/Output Summary (Last 24 hours) at 04/21/2024 1035 Last data filed at 04/21/2024 0830 Gross per 24 hour  Intake 240 ml  Output 300 ml  Net -60 ml   Filed Weights   04/20/24 1356 04/20/24 2200 04/21/24 0527  Weight: 116.6 kg 118.7 kg 118.7 kg    Telemetry    Paced rhythm  - Personally Reviewed  ECG     - Personally Reviewed  Physical Exam     General: Comfortable Head: Atraumatic, normal size  Eyes: PEERLA, EOMI  Neck: Supple, normal JVD Cardiac: Normal S1, S2; RRR; no murmurs, rubs, or gallops Lungs:  Clear to auscultation bilaterally Abd: Soft, nontender, no hepatomegaly  Ext: warm, no edema Musculoskeletal: No deformities, BUE and BLE strength normal and equal Skin: Warm and dry, no rashes     Labs    Chemistry Recent Labs  Lab 04/20/24 1423 04/20/24 1430  NA 137 137  K 3.5 3.5  CL 97* 100  CO2 21*  --   GLUCOSE 226* 222*  BUN 46* 40*  CREATININE 3.16* 3.50*  CALCIUM 10.2  --   PROT 8.5*  --   ALBUMIN 2.9*  --   AST 62*  --   ALT 25  --   ALKPHOS 70  --   BILITOT 1.0  --   GFRNONAA 14*  --   ANIONGAP 19*  --      Hematology Recent Labs  Lab 04/20/24 1423 04/20/24 1430 04/21/24 0843  WBC 5.6  --  5.5  RBC 2.59*  --  2.54*  HGB 8.5* 9.2* 8.4*  HCT 27.6* 27.0* 26.5*  MCV 106.6*  --  104.3*  MCH 32.8  --  33.1  MCHC 30.8  --  31.7  RDW 17.9*  --  17.9*  PLT 222  --  228    Cardiac EnzymesNo results for input(s): "TROPONINI" in the last 168 hours. No results for input(s): "TROPIPOC" in the last 168 hours.   BNP Recent Labs  Lab 04/20/24 1423  BNP 2,719.9*     DDimer No results for input(s): "DDIMER" in the last 168 hours.   Radiology    DG Chest Port 1 View Result Date: 04/20/2024 CLINICAL DATA:  sob and leg swelling EXAM: PORTABLE CHEST - 1 VIEW COMPARISON:  March 19, 2024 FINDINGS: Central pulmonary vascular congestion. No focal airspace consolidation, pleural effusion, or pneumothorax. Unchanged cardiomegaly. Left chest pacemaker with leads terminating in the right atrium and right ventricle. No acute fracture or destructive lesion. Multilevel thoracic osteophytosis. Surgical clips in the left neck. Osteopenia. IMPRESSION: Unchanged cardiomegaly with findings of central pulmonary vascular congestion. Electronically Signed   By: Rance Burrows M.D.   On: 04/20/2024 15:44    Cardiac Studies  Echo pending    Patient Profile     89 y.o. female   Assessment & Plan    Paroxysmal atrial fibrillation  - in sinus rhythm, on amiodarone . Eliquis  held  due to anemia and need for anemia/GI work up.   Hypertension - blood pressure at target.  Heart failure with preserved ejection fraction- she is on IVLasix , cr seems to be worsening. If it increases on today's blood work please hold the lasix . Echo pending today  CAD - no angina. Continue with statin. No plans for any ischemic evaluation.  AKI on CKD - if her cr keeps worsening she needs nephrology evaluation  PPM - paced this morning   Addendum: Discussed with nephrologist giving her low blood pressure and heart rate being controlled in sinus rhythm will hold the Cardizem  for now, continue her amiodarone  and metoprolol .  Will hold the Lasix  and will give midodrine to help with blood pressure she really needs to be diuresed.  Hoping that this will help with her improving her blood pressure for her much-needed diuretics use.    For questions or updates, please contact [facility] Please consult [email or web address] for contact info under Cardiology/STEMI.      Signed, Caelen Higinbotham, DO  04/21/2024, 10:35 AM

## 2024-04-21 NOTE — Consult Note (Signed)
 Renal Service Consult Note [facility]  Kirsten Jensen 04/21/2024 Kirsten Sandifer, MD Requesting Physician: Dr. Hilton Lucky  Reason for Consult: Renal failure HPI: The patient is a 89 y.o. year-old w/ PMH as below who presented to ED yesterday from assisted living reporting increased swelling in the legs and abdomen.  Also DOE.  History of CHF.  Patient has significant cardiac history with indwelling pacemaker, HTN, CAD as s/p stenting and hyperlipidemia.  In the ED blood pressures were stable, creatinine 3.5 with baseline 1.6, hemoglobin 9, BNP 2719 and x-rays showing chronic cardiomegaly with vascular congestion.  Patient was admitted for volume overload and treated with IV Lasix  in the emergency room 40 mg.  Cardiac and GI were consulted.  Last echo showed EF of 60% in October 2024.  Patient was admitted.  We are asked to see for renal failure.   Pt seen in room. States she was started on po lasix  about 1 month ago for the LE edema. It was later increased from 20mg  daily to 60mg  daily. Pt denies any SOB or DOE. She has difficulty walking and a dry mouth.    ROS - denies CP, no joint pain, no HA, no blurry vision, no rash, no diarrhea, no nausea/ vomiting  PMH: Atrial fibrillation CAD Diabetes type 2 HL HTN Morbid obesity Tachybradycardia syndrome H/o permanent pacemaker placement  Past Surgical History  Past Surgical History:  Procedure Laterality Date   CARDIOVERSION N/A 10/10/2023   Procedure: CARDIOVERSION;  Surgeon: Hazle Lites, MD;  Location: [facility] INVASIVE CV LAB;  Service: Cardiovascular;  Laterality: N/A;   COLONOSCOPY N/A 11/02/2020   Procedure: COLONOSCOPY;  Surgeon: Ozell Blunt, MD;  Location: [facility] ENDOSCOPY;  Service: Endoscopy;  Laterality: N/A;   ESOPHAGOGASTRODUODENOSCOPY N/A 11/01/2020   Procedure: ESOPHAGOGASTRODUODENOSCOPY (EGD);  Surgeon: Ozell Blunt, MD;  Location: [facility] ENDOSCOPY;  Service: Endoscopy;  Laterality: N/A;   TRANSESOPHAGEAL  ECHOCARDIOGRAM (CATH LAB)  10/10/2023   Procedure: TRANSESOPHAGEAL ECHOCARDIOGRAM;  Surgeon: Hazle Lites, MD;  Location: [facility] INVASIVE CV LAB;  Service: Cardiovascular;;   Family History  Family History  Problem Relation Age of Onset   Hypertension Mother    Stroke Mother        died at age 33   CAD Father        MI at age of 3   Breast cancer Daughter    Sleep apnea Son    Social History  reports that she has never smoked. She has never used smokeless tobacco. She reports that she does not currently use alcohol. She reports that she does not use drugs. Allergies No Known Allergies Home medications Prior to Admission medications   Medication Sig Start Date End Date Taking? Authorizing Provider  acetaminophen  (TYLENOL ) 500 MG tablet Take 500 mg by mouth every 6 (six) hours as needed for mild pain (pain score 1-3).   Yes [provider]  amiodarone  (PACERONE ) 200 MG tablet Take 1 tablet (200 mg total) by mouth daily. Patient taking differently: Take 200 mg by mouth in the morning. 11/22/23  Yes Debbie Fails, PA-C  apixaban  (ELIQUIS ) 2.5 MG TABS tablet Take 1 tablet (2.5 mg total) by mouth 2 (two) times daily. 03/19/24  Yes Debbie Fails, PA-C  ascorbic acid  (VITAMIN C) 500 MG tablet Take 1 tablet (500 mg total) by mouth daily. Patient taking differently: Take 500 mg by mouth in the morning. 03/27/21  Yes Claretta Croft, MD  buPROPion  (WELLBUTRIN ) 100 MG tablet Take 100 mg by  mouth at bedtime.   Yes [provider]  calcium carbonate (OSCAL) 1500 (600 Ca) MG TABS tablet Take 600 mg of elemental calcium by mouth 2 (two) times daily.   Yes [provider]  cholecalciferol  (VITAMIN D3) 25 MCG (1000 UNIT) tablet Take 1,000 Units by mouth in the morning.   Yes [provider]  diltiazem  (CARDIZEM  SR) 120 MG 12 hr capsule Take 1 capsule (120 mg total) by mouth daily. Patient taking differently: Take 120 mg by mouth in the morning. 03/19/24  Yes Debbie Fails, PA-C  ferrous sulfate  325 (65 FE) MG tablet Take 1 tablet (325 mg total) by mouth daily with breakfast. Patient taking differently: Take 325 mg by mouth in the morning. 11/05/20 04/20/24 Yes Macdonald Savoy, MD  fluticasone (FLONASE) 50 MCG/ACT nasal spray Place 1 spray into both nostrils daily.   Yes [provider]  furosemide  (LASIX ) 40 MG tablet Take one tablet by mouth daily, except on Tuesdays and Thursdays, take 60 mg. Patient taking differently: Take 40 mg by mouth daily. Take one tablet by mouth daily except on Tuesdays and Thursdays. 04/11/24  Yes Ollis, Brandi L, NP  FUROSEMIDE  PO Take 60 mg by mouth 2 (two) times a week. Tuesday and Thursday   Yes [provider]  gemfibrozil  (LOPID ) 600 MG tablet Take 600 mg by mouth 2 (two) times daily. 07/04/22  Yes [provider]  HYDROcodone -acetaminophen  (NORCO/VICODIN) 5-325 MG tablet Take 1 tablet by mouth every 8 (eight) hours as needed for moderate pain (pain score 4-6). 09/08/22  Yes [provider]  hydrocortisone cream 1 % Apply 1 Application topically as needed for itching.   Yes [provider]  JARDIANCE 10 MG TABS tablet Take 10 mg by mouth in the morning. 04/23/22  Yes [provider]  levothyroxine  (SYNTHROID ) 50 MCG tablet Take 50 mcg by mouth daily before breakfast.   Yes [provider]  magnesium  oxide (MAG-OX) 400 MG tablet Take 1 tablet (400 mg total) by mouth daily. Patient taking differently: Take 400 mg by mouth in the morning. 02/05/20  Yes Fenton, Clint R, PA  metFORMIN  (GLUCOPHAGE ) 500 MG tablet Take 500 mg by mouth in the morning.   Yes [provider]  metoprolol  succinate (TOPROL  XL) 25 MG 24 hr tablet Take 1 tablet (25 mg total) by mouth daily. Patient taking differently: Take 25 mg by mouth in the morning. 06/02/22  Yes Verona Goodwill, MD  Multiple Vitamins-Minerals (OCUVITE ADULT 50+ PO) Take 1 capsule by mouth in the morning.   Yes  [provider]  pantoprazole  (PROTONIX ) 40 MG tablet Take 1 tablet (40 mg total) by mouth 2 (two) times daily before a meal. Patient taking differently: Take 40 mg by mouth in the morning. 11/05/20  Yes Macdonald Savoy, MD  polyethylene glycol (MIRALAX  / GLYCOLAX ) 17 g packet Take 17 g by mouth as needed for mild constipation or moderate constipation.   Yes [provider]  polyethylene glycol powder (GLYCOLAX /MIRALAX ) 17 GM/SCOOP powder Take 17 g by mouth in the morning.   Yes [provider]  pravastatin  (PRAVACHOL ) 80 MG tablet Take 80 mg by mouth at bedtime.   Yes [provider]  tiZANidine (ZANAFLEX) 4 MG tablet Take 4 mg by mouth every 8 (eight) hours as needed for muscle spasms.   Yes [provider]  vitamin B-12 (CYANOCOBALAMIN ) 1000 MCG tablet Take 1,000 mcg by mouth in the morning.   Yes [provider]  Vitals:   04/21/24 0527 04/21/24 0540 04/21/24 0814 04/21/24 1116  BP: (!) 110/50 (!) 128/55 (!) 109/47 (!) 104/55  Pulse: 60  60 61  Resp: 20 (!) 9 20 (!) 28  Temp: 97.6 F (36.4 C)  97.7 F (36.5 C) (!) 97.3 F (36.3 C)  TempSrc: Oral  Oral Oral  SpO2: 97%  97% 93%  Weight: 118.7 kg     Height:       Exam Gen alert, no distress, obese WF lying at 60 deg No rash, cyanosis or gangrene Sclera anicteric, throat clear  No jvd or bruits Chest clear bilat to bases, no rales/ wheezing RRR no MRG Abd soft ntnd no mass or ascites +bs GU defer MS no joint effusions or deformity Ext 1+ bilat pretib edema, no hip or UE edema Neuro is alert, Ox 3 , nf, deconditioned    Renal-related home meds: Cardizem  SR 120 daily Lasix  40 mg daily Jardiance 10 mg every morning Toprol -XL 25 mg daily  Date   Creat  eGFR (ml/min) 2020   0.81 April 2021  1.05  Nov - dec 2021 0.91- 1.33  Mar- April 2022 1.27- 2.1 21- 41 ml/min  09/09/22  1.12  47 09/06/23  1.09  49 Oct- dec 2024  1.26- 1.75 28- 41  ml/min 01/05/24  1.63 04/20/24  3.16   04/20/24  3.50    CXR - no pulm edema  Assessment/ Plan: AKI on CKD 3b - b/l creat 1.3- 1.7 from late 2024, eGFR 28-41 ml/min. Creat here is 3.1 on admission in the setting of pt's c/o leg edema. Was started on po lasix  about 1 mo ago, and increased about 2 wks ago, and on exam she does have some pretib edema, but no pulm edema by CXR and no hypoxia. Mouth is dry and neck veins flat. Creat went up from 3.0 to 3.5 after IV lasix  40mg  x 1 yesterday. BP's are soft 105/ 50 this am. She is on cardizem  CD and toprol  xl and amiodarone  for her afib, HR high 50s- 60s. Have d/w cardiology - we will hold lasix  w/ rising creat and not a lot of edema. Also bp's have dropped and HR is borderline- cardiology will hold cardizem  for now, cont toprol  xl and we will add midodrine 5 tid. Will follow.   Volume: as above, mild pitting edema lower legs, no resp issues.  Atrial fib: on amiodarone , toprol  xl, and cardizem  cd. Holding cardizem  as above for now.  H/o PPM HTN: as above HFpEF: last echo oct 2024, EF was 60-65%      Larry Poag  MD [facility] 04/21/2024, 11:36 AM  Recent Labs  Lab 04/20/24 1423 04/20/24 1430 04/21/24 0843  HGB 8.5* 9.2* 8.4*  ALBUMIN 2.9*  --   --   CALCIUM 10.2  --   --   CREATININE 3.16* 3.50*  --   K 3.5 3.5  --    Inpatient medications:  amiodarone   200 mg Oral Daily   ascorbic acid   500 mg Oral Daily   buPROPion   100 mg Oral Daily   calcium carbonate  1 tablet Oral BID WC   cholecalciferol   1,000 Units Oral Daily   cyanocobalamin   1,000 mcg Oral Daily   diltiazem   120 mg Oral Daily   feeding supplement  237 mL Oral BID BM   ferrous sulfate   325 mg Oral Daily   fluticasone  1 spray Each Nare Daily   furosemide   40 mg Intravenous BID  gemfibrozil   600 mg Oral BID   heparin  5,000 Units Subcutaneous Q8H   hydrocortisone  25 mg Rectal BID   insulin  aspart  0-9 Units Subcutaneous TID WC   levothyroxine   50 mcg Oral Q0600   metoprolol   succinate  25 mg Oral Daily   pantoprazole   40 mg Oral BID AC   polyethylene glycol  17 g Oral Daily   pravastatin   80 mg Oral q1800    acetaminophen  **OR** acetaminophen , tiZANidine

## 2024-04-21 NOTE — Progress Notes (Signed)
 Echocardiogram 2D Echocardiogram has been performed.  Kirsten Jensen 04/21/2024, 6:32 PM

## 2024-04-22 DIAGNOSIS — I495 Sick sinus syndrome: Secondary | ICD-10-CM | POA: Diagnosis not present

## 2024-04-22 DIAGNOSIS — N179 Acute kidney failure, unspecified: Secondary | ICD-10-CM | POA: Diagnosis not present

## 2024-04-22 DIAGNOSIS — N1832 Chronic kidney disease, stage 3b: Secondary | ICD-10-CM | POA: Diagnosis not present

## 2024-04-22 DIAGNOSIS — N170 Acute kidney failure with tubular necrosis: Secondary | ICD-10-CM | POA: Diagnosis not present

## 2024-04-22 DIAGNOSIS — I5033 Acute on chronic diastolic (congestive) heart failure: Secondary | ICD-10-CM | POA: Diagnosis not present

## 2024-04-22 LAB — BASIC METABOLIC PANEL WITH GFR
Anion gap: 16 — ABNORMAL HIGH (ref 5–15)
BUN: 42 mg/dL — ABNORMAL HIGH (ref 8–23)
CO2: 23 mmol/L (ref 22–32)
Calcium: 10.3 mg/dL (ref 8.9–10.3)
Chloride: 99 mmol/L (ref 98–111)
Creatinine, Ser: 3.03 mg/dL — ABNORMAL HIGH (ref 0.44–1.00)
GFR, Estimated: 14 mL/min — ABNORMAL LOW (ref >60)
Glucose, Bld: 202 mg/dL — ABNORMAL HIGH (ref 70–99)
Potassium: 4 mmol/L (ref 3.5–5.1)
Sodium: 138 mmol/L (ref 135–145)

## 2024-04-22 LAB — GLUCOSE, CAPILLARY
Glucose-Capillary: 171 mg/dL — ABNORMAL HIGH (ref 70–99)
Glucose-Capillary: 237 mg/dL — ABNORMAL HIGH (ref 70–99)
Glucose-Capillary: 204 mg/dL — ABNORMAL HIGH (ref 70–99)

## 2024-04-22 MED ORDER — ONDANSETRON HCL 4 MG/2ML IJ SOLN
4.0000 mg | Freq: Four times a day (QID) | INTRAMUSCULAR | Status: DC | PRN
  Administered 2024-04-23 – 2024-04-24 (×2): 4 mg via INTRAVENOUS
  Filled 2024-04-22 (×3): qty 2

## 2024-04-22 MED ORDER — APIXABAN 2.5 MG PO TABS
2.5000 mg | ORAL_TABLET | Freq: Two times a day (BID) | ORAL | Status: DC
  Administered 2024-04-22 – 2024-04-24 (×6): 2.5 mg via ORAL
  Filled 2024-04-22 (×6): qty 1

## 2024-04-22 MED ORDER — MIDODRINE HCL 5 MG PO TABS
10.0000 mg | ORAL_TABLET | Freq: Three times a day (TID) | ORAL | Status: DC
  Administered 2024-04-22 – 2024-04-24 (×8): 10 mg via ORAL
  Filled 2024-04-22 (×8): qty 2

## 2024-04-22 NOTE — Progress Notes (Signed)
 PROGRESS NOTE    Kirsten MOREAN  Jensen:[id number] DOB: 01-31-1932 DOA: 04/20/2024 PCP: Tena Feeling, MD    Brief Narrative:  89 year old with history of extensive cardiac issues including paroxysmal A-fib, sick sinus syndrome status post pacemaker chronically on Eliquis , type 2 diabetes on metformin , coronary artery disease status post stenting, morbid obesity, hypertension hyperlipidemia and CKD stage IIIb presented to the emergency room with about 1 month of gradually worsening leg swelling and gradual decrease in mobility.  In the emergency room on room air.  Blood pressure stable.  Hemoglobin 9 with recent hemoglobin of 11.  Creatinine 3.5 with recent creatinine of 1.6.  Chest x-ray with chronic cardiomegaly and prominent bronchovascular markings.  BNP was 2719.  Admitted with fluid overload, AKI and anemia.  Subjective: Patient seen and examined.  Denies any complaints at rest.  Not mobilized yet.  Not having any more bleeding with bowel movements.  Urine output 1400 mL since admit. Lasix  on hold.  Started on midodrine.  Cardizem  on hold.  Assessment & Plan:   Acute on chronic diastolic heart failure: Fluid overload, multifactorial. Initially treated with IV Lasix  and then discontinued to improve hemodynamics.  Diuretics currently on hold. home dose of Toprol  XL 25 mg daily. Holding ramipril  due to AKI. Further diuretic dosing as per cardiology team. Repeat echocardiogram with normal ejection fraction, grade 3 diastolic dysfunction.   AKI on CKD stage IIIb: Probably multifactorial and prerenal. Recent baseline creatinine 1.6.  Creatinine 3.5.-3 today. Strict intake output monitoring.  Potassium is adequate. Holding Jardiance, ramipril  and metformin .  Nephrology following.  Started on midodrine 10 mg 3 times daily today.   Acute on chronic iron deficiency anemia: Patient with known hemorrhoids and intermittently bleeding. EGD, colonoscopy reviewed from 2021.  She has internal and  external hemorrhoids. Started patient on steroid suppositories. Back on Eliquis  today as she is not actively bleeding.   Chronic A-fib, tachybradycardia syndrome status post pacemaker: Currently stable.  Rate is controlled.  Metoprolol  resumed.  Cardizem  on hold to improve blood pressures.  Resume Eliquis  today.    Type 2 diabetes: Well-controlled.  On metformin  at home.  Remains on SSI.   Hypertension: As above.  At risk of hypotension.  Continuing Toprol  XL with holding parameters.  Discontinued ramipril  and Cardizem .   Hypothyroidism: On Synthroid .   DVT prophylaxis: apixaban  (ELIQUIS ) tablet 2.5 mg Start: 04/22/24 1000 apixaban  (ELIQUIS ) tablet 2.5 mg   Code Status: Full code Family Communication: None at the bedside Disposition Plan: Status is: Inpatient Remains inpatient appropriate because: Medication management, renal function recovery   Consultants:  Cardiology Nephrology  Procedures:  None   Antimicrobials:  None     Objective: Vitals:   04/22/24 0459 04/22/24 0500 04/22/24 0845 04/22/24 1057  BP: 117/61 117/61 (!) 109/52 96/70  Pulse: (!) 52 (!) 52 60   Resp:  19 18 17   Temp: 97.7 F (36.5 C)  97.7 F (36.5 C)   TempSrc: Oral  Oral   SpO2: 91% 91% 95%   Weight:      Height:        Intake/Output Summary (Last 24 hours) at 04/22/2024 1114 Last data filed at 04/22/2024 0501 Gross per 24 hour  Intake 240 ml  Output 900 ml  Net -660 ml   Filed Weights   04/20/24 1356 04/20/24 2200 04/21/24 0527  Weight: 116.6 kg 118.7 kg 118.7 kg    Examination:  General exam: Appears calm and comfortable , on room air.  Respiratory system: Clear  to auscultation. Respiratory effort normal. No added sounds  Cardiovascular system: S1 & S2 heard, RRR.  3+ bilateral pedal edema. Gastrointestinal system: Abdomen is nondistended, soft and nontender. No organomegaly or masses felt. Normal bowel sounds heard. Central nervous system: Alert and oriented. No focal  neurological deficits. Extremities: Symmetric 5 x 5 power.  Generalized weakness.    Data Reviewed: I have personally reviewed following labs and imaging studies  CBC: Recent Labs  Lab 04/20/24 1423 04/20/24 1430 04/21/24 0843  WBC 5.6  --  5.5  NEUTROABS 4.2  --   --   HGB 8.5* 9.2* 8.4*  HCT 27.6* 27.0* 26.5*  MCV 106.6*  --  104.3*  PLT 222  --  228   Basic Metabolic Panel: Recent Labs  Lab 04/20/24 1423 04/20/24 1430 04/21/24 1159 04/22/24 0250  NA 137 137 136 138  K 3.5 3.5 3.4* 4.0  CL 97* 100 99 99  CO2 21*  --  23 23  GLUCOSE 226* 222* 200* 202*  BUN 46* 40* 42* 42*  CREATININE 3.16* 3.50* 2.95* 3.03*  CALCIUM 10.2  --  10.0 10.3   GFR: Estimated Creatinine Clearance: 16.8 mL/min (A) (by C-G formula based on SCr of 3.03 mg/dL (H)). Liver Function Tests: Recent Labs  Lab 04/20/24 1423  AST 62*  ALT 25  ALKPHOS 70  BILITOT 1.0  PROT 8.5*  ALBUMIN 2.9*   No results for input(s): "LIPASE", "AMYLASE" in the last 168 hours. No results for input(s): "AMMONIA" in the last 168 hours. Coagulation Profile: No results for input(s): "INR", "PROTIME" in the last 168 hours. Cardiac Enzymes: No results for input(s): "CKTOTAL", "CKMB", "CKMBINDEX", "TROPONINI" in the last 168 hours. BNP (last 3 results) No results for input(s): "PROBNP" in the last 8760 hours. HbA1C: No results for input(s): "HGBA1C" in the last 72 hours. CBG: Recent Labs  Lab 04/21/24 1111 04/21/24 1615 04/21/24 2235 04/22/24 0615  GLUCAP 195* 170* 185* 171*   Lipid Profile: No results for input(s): "CHOL", "HDL", "LDLCALC", "TRIG", "CHOLHDL", "LDLDIRECT" in the last 72 hours. Thyroid  Function Tests: No results for input(s): "TSH", "T4TOTAL", "FREET4", "T3FREE", "THYROIDAB" in the last 72 hours. Anemia Panel: Recent Labs    04/21/24 1159  FERRITIN 85  TIBC 354  IRON 41   Sepsis Labs: No results for input(s): "PROCALCITON", "LATICACIDVEN" in the last 168 hours.  No results  found for this or any previous visit (from the past 240 hours).       Radiology Studies: ECHOCARDIOGRAM COMPLETE Result Date: 04/21/2024    ECHOCARDIOGRAM REPORT   Patient Name:   Kirsten Jensen Date of Exam: 04/21/2024 Medical Rec #:  [id number]      Height:       67.0 in Accession #:    [id number]     Weight:       261.7 lb Date of Birth:  05-05-1932       BSA:          2.267 m Patient Age:    89 years       BP:           104/55 mmHg Patient Gender: F              HR:           61 bpm. Exam Location:  Inpatient Procedure: 2D Echo, Cardiac Doppler and Color Doppler (Both Spectral and Color            Flow Doppler were utilized during procedure). Indications:  CHF- Acute Diastolic I50.31  History:        Patient has prior history of Echocardiogram examinations, most                 recent 10/31/2020. CHF, Pacemaker, Arrythmias:Atrial                 Fibrillation, Atrial Flutter, Tachycardia and Bradycardia,                 Signs/Symptoms:Chest Pain; Risk Factors:Hypertension, Diabetes                 and Dyslipidemia.  Sonographer:    Terrilee Few RCS Referring Phys: [id number] Leondro Coryell IMPRESSIONS  1. Left ventricular ejection fraction, by estimation, is 60 to 65%. The left ventricle has normal function. The left ventricle has no regional wall motion abnormalities. There is mild left ventricular hypertrophy. Left ventricular diastolic parameters are consistent with Grade III diastolic dysfunction (restrictive). Elevated left atrial pressure.  2. Right ventricular systolic function is normal. The right ventricular size is normal. There is moderately elevated pulmonary artery systolic pressure. The estimated right ventricular systolic pressure is 46.1 mmHg.  3. Left atrial size was moderately dilated.  4. Right atrial size was mildly dilated.  5. The mitral valve is degenerative. Mild mitral valve regurgitation. No evidence of mitral stenosis. Moderate mitral annular calcification.  6. The tricuspid  valve is abnormal. Tricuspid valve regurgitation is severe.  7. The aortic valve is tricuspid. Aortic valve regurgitation is not visualized. Aortic valve sclerosis is present, with no evidence of aortic valve stenosis.  8. The inferior vena cava is dilated in size with <50% respiratory variability, suggesting right atrial pressure of 15 mmHg. FINDINGS  Left Ventricle: Left ventricular ejection fraction, by estimation, is 60 to 65%. The left ventricle has normal function. The left ventricle has no regional wall motion abnormalities. The left ventricular internal cavity size was normal in size. There is  mild left ventricular hypertrophy. Left ventricular diastolic parameters are consistent with Grade III diastolic dysfunction (restrictive). Elevated left atrial pressure. Right Ventricle: The right ventricular size is normal. No increase in right ventricular wall thickness. Right ventricular systolic function is normal. There is moderately elevated pulmonary artery systolic pressure. The tricuspid regurgitant velocity is 2.79 m/s, and with an assumed right atrial pressure of 15 mmHg, the estimated right ventricular systolic pressure is 46.1 mmHg. Left Atrium: Left atrial size was moderately dilated. Right Atrium: Right atrial size was mildly dilated. Pericardium: Trivial pericardial effusion is present. Mitral Valve: The mitral valve is degenerative in appearance. Moderate mitral annular calcification. Mild mitral valve regurgitation. No evidence of mitral valve stenosis. Tricuspid Valve: The tricuspid valve is abnormal. Tricuspid valve regurgitation is severe. Aortic Valve: The aortic valve is tricuspid. Aortic valve regurgitation is not visualized. Aortic valve sclerosis is present, with no evidence of aortic valve stenosis. Aortic valve peak gradient measures 5.9 mmHg. Pulmonic Valve: The pulmonic valve was not well visualized. Pulmonic valve regurgitation is not visualized. Aorta: The aortic root and ascending  aorta are structurally normal, with no evidence of dilitation. Venous: The inferior vena cava is dilated in size with less than 50% respiratory variability, suggesting right atrial pressure of 15 mmHg. IAS/Shunts: The interatrial septum was not well visualized.  LEFT VENTRICLE PLAX 2D LVIDd:         4.10 cm   Diastology LVIDs:         3.00 cm   LV e' medial:    4.26 cm/s LV  PW:         0.80 cm   LV E/e' medial:  21.4 LV IVS:        0.90 cm   LV e' lateral:   6.12 cm/s LVOT diam:     2.10 cm   LV E/e' lateral: 14.9 LV SV:         59 LV SV Index:   26 LVOT Area:     3.46 cm  RIGHT VENTRICLE             IVC RV S prime:     11.40 cm/s  IVC diam: 2.20 cm TAPSE (M-mode): 1.3 cm LEFT ATRIUM             Index        RIGHT ATRIUM           Index LA diam:        4.00 cm 1.76 cm/m   RA Area:     24.10 cm LA Vol (A2C):   98.8 ml 43.56 ml/m  RA Volume:   79.90 ml  35.24 ml/m LA Vol (A4C):   84.0 ml 37.05 ml/m LA Biplane Vol: 98.3 ml 43.36 ml/m  AORTIC VALVE AV Area (Vmax): 2.61 cm AV Vmax:        121.00 cm/s AV Peak Grad:   5.9 mmHg LVOT Vmax:      91.20 cm/s LVOT Vmean:     58.700 cm/s LVOT VTI:       0.169 m  AORTA Ao Root diam: 3.30 cm Ao Asc diam:  3.10 cm MITRAL VALVE               TRICUSPID VALVE MV Area (PHT): 3.65 cm    TR Peak grad:   31.1 mmHg MV Decel Time: 208 msec    TR Vmax:        279.00 cm/s MV E velocity: 91.20 cm/s MV A velocity: 28.30 cm/s  SHUNTS MV E/A ratio:  3.22        Systemic VTI:  0.17 m                            Systemic Diam: 2.10 cm Carson Clara MD Electronically signed by Carson Clara MD Signature Date/Time: 04/21/2024/10:28:45 PM    Final    DG Chest Port 1 View Result Date: 04/20/2024 CLINICAL DATA:  sob and leg swelling EXAM: PORTABLE CHEST - 1 VIEW COMPARISON:  March 19, 2024 FINDINGS: Central pulmonary vascular congestion. No focal airspace consolidation, pleural effusion, or pneumothorax. Unchanged cardiomegaly. Left chest pacemaker with leads terminating in the  right atrium and right ventricle. No acute fracture or destructive lesion. Multilevel thoracic osteophytosis. Surgical clips in the left neck. Osteopenia. IMPRESSION: Unchanged cardiomegaly with findings of central pulmonary vascular congestion. Electronically Signed   By: Rance Burrows M.D.   On: 04/20/2024 15:44        Scheduled Meds:  amiodarone   200 mg Oral Daily   apixaban   2.5 mg Oral BID   ascorbic acid   500 mg Oral Daily   buPROPion   100 mg Oral Daily   calcium carbonate  1 tablet Oral BID WC   cholecalciferol   1,000 Units Oral Daily   cyanocobalamin   1,000 mcg Oral Daily   feeding supplement  237 mL Oral BID BM   ferrous sulfate   325 mg Oral Daily   fluticasone  1 spray Each Nare Daily   gemfibrozil   600 mg Oral BID  hydrocortisone  25 mg Rectal BID   insulin  aspart  0-9 Units Subcutaneous TID WC   levothyroxine   50 mcg Oral Q0600   metoprolol  succinate  25 mg Oral Daily   midodrine  10 mg Oral TID WC   pantoprazole   40 mg Oral BID AC   polyethylene glycol  17 g Oral Daily   potassium chloride   40 mEq Oral Daily   pravastatin   80 mg Oral q1800   Continuous Infusions:   LOS: 2 days       Elizebath Wever, MD [facility]

## 2024-04-22 NOTE — Plan of Care (Signed)
  Problem: Education: Goal: Knowledge of General Education information will improve Description: Including pain rating scale, medication(s)/side effects and non-pharmacologic comfort measures Outcome: Progressing   Problem: Clinical Measurements: Goal: Will remain free from infection Outcome: Progressing Goal: Respiratory complications will improve Outcome: Progressing   Problem: Elimination: Goal: Will not experience complications related to urinary retention Outcome: Progressing   Problem: Pain Managment: Goal: General experience of comfort will improve and/or be controlled Outcome: Progressing   Problem: Activity: Goal: Risk for activity intolerance will decrease Outcome: Not Progressing

## 2024-04-22 NOTE — Progress Notes (Signed)
 [facility] Progress Note  Subjective:  UOP 1300 cc yesterday Creat down to 3.0 this am Bp's up a bit, better, 117- 120/ 61- 70  Vitals:   04/22/24 0459 04/22/24 0500 04/22/24 0845 04/22/24 1057  BP: 117/61 117/61 (!) 109/52 96/70  Pulse: (!) 52 (!) 52 60   Resp:  19 18 17   Temp: 97.7 F (36.5 C)  97.7 F (36.5 C)   TempSrc: Oral  Oral   SpO2: 91% 91% 95%   Weight:      Height:        Exam: Gen alert, no distress No rash, cyanosis or gangrene Sclera anicteric, throat clear  No jvd or bruits Chest clear bilat to bases, no rales/ wheezing RRR no MRG Abd soft ntnd no mass or ascites +bs Ext 1+ bilat pretib edema, no other edema Neuro is alert, Ox 3 , nf, deconditioned      Renal-related home meds: Cardizem  SR 120 daily Lasix  40 mg daily Jardiance 10 mg every morning Toprol -XL 25 mg daily   Date                             Creat               eGFR (ml/min) 2020                            0.81 April 2021                    1.05      Nov - dec 2021           0.91- 1.33         Mar- April 2022           1.27- 2.1          21- 41 ml/min   09/09/22                        1.12                 47 09/06/23                        1.09                 49 Oct- dec 2024             1.26- 1.75        28- 41 ml/min 01/05/24                        1.63 04/20/24                        3.16                  04/20/24                        3.50               CXR - no pulm edema   Assessment/ Plan: AKI on CKD 3b - b/l creat 1.3- 1.7 from late 2024, eGFR 28-41 ml/min. Creat here is 3.1 on admission in the setting of pt's c/o leg edema. Pt was started on po lasix  1 month ago w/  dose ^ 2 wks ago. On exam there was some pretib edema, but no pulm edema by CXR and no hypoxia. Mouth was a bit dry and neck veins flat. Creat went up from 3.0 to 3.5 after IV lasix  40mg  given on admission. BP's were soft 105/ 50. She was on cardizem  CD, toprol  xl and amiodarone  for her afib. Cardiology  dc'd the cardizem  due to soft pressures, and we added midodrine 5 tid. Toprol  xl and amio were cont'd and lasix  was stopped. BP's have come up to 110-120s, UOP was very good overnight and creat dropped to 3.0. Appears that diuresing her may require higher BP's first. Midodrine ^'d to 10 tid. Cont to hold lasix , UOP is better and creat improving. Will follow.  Volume: as above, mild pitting edema lower legs, no resp issues.  Atrial fib: on amiodarone , toprol  xl, and cardizem  cd. Holding cardizem  as above for now.  H/o PPM HTN: as above HFpEF: last echo oct 2024, EF was 60-65%      Larry Poag MD  [facility] 04/22/2024, 12:23 PM  Recent Labs  Lab 04/20/24 1423 04/20/24 1430 04/21/24 0843 04/21/24 1159 04/22/24 0250  HGB 8.5* 9.2* 8.4*  --   --   ALBUMIN 2.9*  --   --   --   --   CALCIUM 10.2  --   --  10.0 10.3  CREATININE 3.16* 3.50*  --  2.95* 3.03*  K 3.5 3.5  --  3.4* 4.0   Recent Labs  Lab 04/21/24 1159  IRON 41  TIBC 354  FERRITIN 85   Inpatient medications:  amiodarone   200 mg Oral Daily   apixaban   2.5 mg Oral BID   ascorbic acid   500 mg Oral Daily   buPROPion   100 mg Oral Daily   calcium carbonate  1 tablet Oral BID WC   cholecalciferol   1,000 Units Oral Daily   cyanocobalamin   1,000 mcg Oral Daily   feeding supplement  237 mL Oral BID BM   ferrous sulfate   325 mg Oral Daily   fluticasone  1 spray Each Nare Daily   gemfibrozil   600 mg Oral BID   hydrocortisone  25 mg Rectal BID   insulin  aspart  0-9 Units Subcutaneous TID WC   levothyroxine   50 mcg Oral Q0600   metoprolol  succinate  25 mg Oral Daily   midodrine  10 mg Oral TID WC   pantoprazole   40 mg Oral BID AC   polyethylene glycol  17 g Oral Daily   potassium chloride   40 mEq Oral Daily   pravastatin   80 mg Oral q1800    acetaminophen  **OR** acetaminophen , tiZANidine

## 2024-04-22 NOTE — Evaluation (Signed)
 Physical Therapy Evaluation Patient Details Name: Kirsten Jensen MRN: [id number] DOB: 1931-12-19 Today's Date: 04/22/2024  History of Present Illness  Kirsten Jensen is a 89 y.o. female admitted 04/20/24 for CHF exacerbation. Pt presented with worsening LE swelling, weakness, and SOB. Chest x-ray with chronic cardiomegaly, prominent bronchovascular markings. Found to have AKI, anemia, and fluid overloaded. PMH significant for paroxysmal A-fib, sick sinus syndrome s/p pacemaker, T2DM, CAD s/p stenting, morbid obesity, hypertension, dyslipidemia, and CKD stage IIIb.   Clinical Impression  Pt admitted with above diagnosis. At baseline, pt is independent with functional mobility without an AD and independent with ADLs. Over the past 3 weeks she had noted an onset of weakness and fatigue resulting in increased time and effort to complete all tasks and the need to use a transport chair for mobility. Pt resides at [facility]. She relies on facility staff for all IADLs. Pt currently with functional limitations due to the deficits listed below (see PT Problem List). She required minA for bed mobility, modA for sit<>stand using RW, and minA for bed<>chair using RW. Pt became hypertensive with transfer and had one episode of emesis once seated in recliner chair, RN notified. Pt will benefit from acute skilled PT to increase her independence and safety with mobility to allow discharge. Pt reports the facility staff are able to provide increase assistance in accordance with her CLOF; therefore, recommend HHPT to increase strength, improve cardiopulmonary endurance, decrease fall risk, and advance functional mobility.     If plan is discharge home, recommend the following: A lot of help with walking and/or transfers;A little help with bathing/dressing/bathroom;Assistance with cooking/housework;Assist for transportation;Help with stairs or ramp for entrance   Can travel by private vehicle        Equipment  Recommendations BSC/3in1  Recommendations for Other Services       Functional Status Assessment Patient has had a recent decline in their functional status and demonstrates the ability to make significant improvements in function in a reasonable and predictable amount of time.     Precautions / Restrictions Precautions Precautions: Fall Recall of Precautions/Restrictions: Intact Precaution/Restrictions Comments: Pt is legally blind; Watch BP Restrictions Weight Bearing Restrictions Per Provider Order: No      Mobility  Bed Mobility Overal bed mobility: Needs Assistance Bed Mobility: Supine to Sit, Sit to Supine     Supine to sit: Min assist, Used rails Sit to supine: HOB elevated, Min assist   General bed mobility comments: Pt sat up from a flat bed on the R side with increased time and VC for sequencing. She needed assist bringing BLE off EOB and elevating trunk. Pt scooted fwd to EOB with BUE support. Pt returned to bed lowering trunk and needed assist with bringing BLE back into bed.    Transfers Overall transfer level: Needs assistance Equipment used: Rolling walker (2 wheels) Transfers: Sit to/from Stand, Bed to chair/wheelchair/BSC Sit to Stand: Mod assist, From elevated surface   Step pivot transfers: Min assist       General transfer comment: Pt stood from raised bed height. VC for proper sequencing using RW. ModA to power up. Pt maintained static stance for ~69min before c/o nausea. Pt performed bed<>chair transfer with VC/TC for sequencing of RW and short slow steps. She had one episode of emesis while seated in recliner chair, RN notified. Returned pt back to bed. Good eccentric control with sitting.    Ambulation/Gait  General Gait Details: Deferred secondary to emesis post-transfer  Stairs            Wheelchair Mobility     Tilt Bed    Modified Rankin (Stroke Patients Only)       Balance Overall balance assessment: Needs  assistance Sitting-balance support: No upper extremity supported, Feet supported Sitting balance-Leahy Scale: Fair Sitting balance - Comments: Pt sat EOB with supervision   Standing balance support: Bilateral upper extremity supported, During functional activity, Reliant on assistive device for balance Standing balance-Leahy Scale: Poor Standing balance comment: Pt dependent on RW                             Pertinent Vitals/Pain Pain Assessment Pain Assessment: No/denies pain    Home Living Family/patient expects to be discharged to:: Assisted living [facility])                 Home Equipment: Transport chair;Cane - single point;Rollator (4 wheels);Grab bars - toilet;Grab bars - tub/shower;Hand held shower head      Prior Function Prior Level of Function : Independent/Modified Independent             Mobility Comments: Pt reports she has been having to use the transport chair for mobility over the past 3 weeks. Prior to that she was independent ambulating without AD. Denies falls in the last 33mo. Pt hasn't been upstairs in a long time, uses an elevator. ADLs Comments: Pt reports it is taking significantly increased time and effort to perform ADLs. Prior to that she was independent. Facility staff manage IADLs.     Extremity/Trunk Assessment   Upper Extremity Assessment Upper Extremity Assessment: Defer to OT evaluation    Lower Extremity Assessment Lower Extremity Assessment: Generalized weakness    Cervical / Trunk Assessment Cervical / Trunk Assessment: Other exceptions Cervical / Trunk Exceptions: body habitus  Communication   Communication Communication: No apparent difficulties    Cognition Arousal: Alert Behavior During Therapy: WFL for tasks assessed/performed   PT - Cognitive impairments: No apparent impairments                       PT - Cognition Comments: Pt A,Ox4 Following commands: Intact       Cueing Cueing  Techniques: Verbal cues, Tactile cues     General Comments General comments (skin integrity, edema, etc.): HR 60 bpm. BP: seated EOB 116/77 (89), standing 138/109 (119), seated in recliner chair 126/109 (117), supine with feet elevated 110/56 (72).    Exercises     Assessment/Plan    PT Assessment Patient needs continued PT services  PT Problem List Decreased strength;Decreased activity tolerance;Decreased balance;Decreased mobility;Cardiopulmonary status limiting activity       PT Treatment Interventions DME instruction;Gait training;Functional mobility training;Therapeutic activities;Therapeutic exercise;Balance training;Patient/family education    PT Goals (Current goals can be found in the Care Plan section)  Acute Rehab PT Goals Patient Stated Goal: Regain my strength and return to walking PT Goal Formulation: With patient Time For Goal Achievement: 05/06/24 Potential to Achieve Goals: Good    Frequency Min 2X/week     Co-evaluation               AM-PAC PT "6 Clicks" Mobility  Outcome Measure Help needed turning from your back to your side while in a flat bed without using bedrails?: A Little Help needed moving from lying on your back to sitting on the side  of a flat bed without using bedrails?: A Little Help needed moving to and from a bed to a chair (including a wheelchair)?: A Little Help needed standing up from a chair using your arms (e.g., wheelchair or bedside chair)?: A Lot Help needed to walk in hospital room?: Total Help needed climbing 3-5 steps with a railing? : Total 6 Click Score: 13    End of Session Equipment Utilized During Treatment: Gait belt Activity Tolerance: Patient limited by fatigue;Treatment limited secondary to medical complications (Comment) (emesis and hypertension) Patient left: in bed;with bed alarm set Nurse Communication: Mobility status;Other (comment) (episode of emesis and hypertension with transfer) PT Visit Diagnosis: Muscle  weakness (generalized) (M62.81);Difficulty in walking, not elsewhere classified (R26.2);Unsteadiness on feet (R26.81);Other abnormalities of gait and mobility (R26.89)    Time: 1610-9604 PT Time Calculation (min) (ACUTE ONLY): 50 min   Charges:   PT Evaluation $PT Eval Moderate Complexity: 1 Mod PT Treatments $Therapeutic Activity: 8-22 mins PT General Charges $$ ACUTE PT VISIT: 1 Visit         Glenford Lanes, PT, DPT Acute Rehabilitation Services Office: [phone] Secure Chat Preferred  Riva Chester 04/22/2024, 3:29 PM

## 2024-04-22 NOTE — Progress Notes (Addendum)
 Progress Note  Patient Name: Kirsten Jensen Date of Encounter: 04/22/2024  Primary Cardiologist: Richardo Chandler, MD   Subjective   Patient seen and examined at her bedside.   Inpatient Medications    Scheduled Meds:  amiodarone   200 mg Oral Daily   apixaban   2.5 mg Oral BID   ascorbic acid   500 mg Oral Daily   buPROPion   100 mg Oral Daily   calcium carbonate  1 tablet Oral BID WC   cholecalciferol   1,000 Units Oral Daily   cyanocobalamin   1,000 mcg Oral Daily   feeding supplement  237 mL Oral BID BM   ferrous sulfate   325 mg Oral Daily   fluticasone  1 spray Each Nare Daily   gemfibrozil   600 mg Oral BID   hydrocortisone  25 mg Rectal BID   insulin  aspart  0-9 Units Subcutaneous TID WC   levothyroxine   50 mcg Oral Q0600   metoprolol  succinate  25 mg Oral Daily   midodrine  5 mg Oral TID WC   pantoprazole   40 mg Oral BID AC   polyethylene glycol  17 g Oral Daily   potassium chloride   40 mEq Oral Daily   pravastatin   80 mg Oral q1800   Continuous Infusions:  PRN Meds: acetaminophen  **OR** acetaminophen , tiZANidine   Vital Signs    Vitals:   04/22/24 0400 04/22/24 0459 04/22/24 0500 04/22/24 0845  BP:  117/61 117/61 (!) 109/52  Pulse:  (!) 52 (!) 52 60  Resp: 17  19 18   Temp:  97.7 F (36.5 C)  97.7 F (36.5 C)  TempSrc:  Oral  Oral  SpO2:  91% 91% 95%  Weight:      Height:        Intake/Output Summary (Last 24 hours) at 04/22/2024 1051 Last data filed at 04/22/2024 0501 Gross per 24 hour  Intake 240 ml  Output 1400 ml  Net -1160 ml   Filed Weights   04/20/24 1356 04/20/24 2200 04/21/24 0527  Weight: 116.6 kg 118.7 kg 118.7 kg    Telemetry    Paced rhythm  - Personally Reviewed  ECG     - Personally Reviewed  Physical Exam     General: Comfortable, legally blind Head: Atraumatic, normal size  Eyes: PEERLA, EOMI  Neck: Supple, normal JVD Cardiac: Normal S1, S2; RRR; no murmurs, rubs, or gallops Lungs: Clear to auscultation  bilaterally Abd: Soft, nontender, no hepatomegaly  Ext: warm, no edema Musculoskeletal: No deformities, BUE and BLE strength normal and equal Skin: Warm and dry, no rashes     Labs    Chemistry Recent Labs  Lab 04/20/24 1423 04/20/24 1430 04/21/24 1159 04/22/24 0250  NA 137 137 136 138  K 3.5 3.5 3.4* 4.0  CL 97* 100 99 99  CO2 21*  --  23 23  GLUCOSE 226* 222* 200* 202*  BUN 46* 40* 42* 42*  CREATININE 3.16* 3.50* 2.95* 3.03*  CALCIUM 10.2  --  10.0 10.3  PROT 8.5*  --   --   --   ALBUMIN 2.9*  --   --   --   AST 62*  --   --   --   ALT 25  --   --   --   ALKPHOS 70  --   --   --   BILITOT 1.0  --   --   --   GFRNONAA 14*  --  15* 14*  ANIONGAP 19*  --  14  16*     Hematology Recent Labs  Lab 04/20/24 1423 04/20/24 1430 04/21/24 0843  WBC 5.6  --  5.5  RBC 2.59*  --  2.54*  HGB 8.5* 9.2* 8.4*  HCT 27.6* 27.0* 26.5*  MCV 106.6*  --  104.3*  MCH 32.8  --  33.1  MCHC 30.8  --  31.7  RDW 17.9*  --  17.9*  PLT 222  --  228    Cardiac EnzymesNo results for input(s): "TROPONINI" in the last 168 hours. No results for input(s): "TROPIPOC" in the last 168 hours.   BNP Recent Labs  Lab 04/20/24 1423  BNP 2,719.9*     DDimer No results for input(s): "DDIMER" in the last 168 hours.   Radiology    ECHOCARDIOGRAM COMPLETE Result Date: 04/21/2024    ECHOCARDIOGRAM REPORT   Patient Name:   RUTHELMA BESCH Date of Exam: 04/21/2024 Medical Rec #:  [id number]      Height:       67.0 in Accession #:    [id number]     Weight:       261.7 lb Date of Birth:  Sep 13, 1932       BSA:          2.267 m Patient Age:    89 years       BP:           104/55 mmHg Patient Gender: F              HR:           61 bpm. Exam Location:  Inpatient Procedure: 2D Echo, Cardiac Doppler and Color Doppler (Both Spectral and Color            Flow Doppler were utilized during procedure). Indications:    CHF- Acute Diastolic I50.31  History:        Patient has prior history of Echocardiogram examinations,  most                 recent 10/31/2020. CHF, Pacemaker, Arrythmias:Atrial                 Fibrillation, Atrial Flutter, Tachycardia and Bradycardia,                 Signs/Symptoms:Chest Pain; Risk Factors:Hypertension, Diabetes                 and Dyslipidemia.  Sonographer:    Terrilee Few RCS Referring Phys: [id number] KUBER GHIMIRE IMPRESSIONS  1. Left ventricular ejection fraction, by estimation, is 60 to 65%. The left ventricle has normal function. The left ventricle has no regional wall motion abnormalities. There is mild left ventricular hypertrophy. Left ventricular diastolic parameters are consistent with Grade III diastolic dysfunction (restrictive). Elevated left atrial pressure.  2. Right ventricular systolic function is normal. The right ventricular size is normal. There is moderately elevated pulmonary artery systolic pressure. The estimated right ventricular systolic pressure is 46.1 mmHg.  3. Left atrial size was moderately dilated.  4. Right atrial size was mildly dilated.  5. The mitral valve is degenerative. Mild mitral valve regurgitation. No evidence of mitral stenosis. Moderate mitral annular calcification.  6. The tricuspid valve is abnormal. Tricuspid valve regurgitation is severe.  7. The aortic valve is tricuspid. Aortic valve regurgitation is not visualized. Aortic valve sclerosis is present, with no evidence of aortic valve stenosis.  8. The inferior vena cava is dilated in size with <50% respiratory variability, suggesting right atrial pressure of 15 mmHg.  FINDINGS  Left Ventricle: Left ventricular ejection fraction, by estimation, is 60 to 65%. The left ventricle has normal function. The left ventricle has no regional wall motion abnormalities. The left ventricular internal cavity size was normal in size. There is  mild left ventricular hypertrophy. Left ventricular diastolic parameters are consistent with Grade III diastolic dysfunction (restrictive). Elevated left atrial pressure.  Right Ventricle: The right ventricular size is normal. No increase in right ventricular wall thickness. Right ventricular systolic function is normal. There is moderately elevated pulmonary artery systolic pressure. The tricuspid regurgitant velocity is 2.79 m/s, and with an assumed right atrial pressure of 15 mmHg, the estimated right ventricular systolic pressure is 46.1 mmHg. Left Atrium: Left atrial size was moderately dilated. Right Atrium: Right atrial size was mildly dilated. Pericardium: Trivial pericardial effusion is present. Mitral Valve: The mitral valve is degenerative in appearance. Moderate mitral annular calcification. Mild mitral valve regurgitation. No evidence of mitral valve stenosis. Tricuspid Valve: The tricuspid valve is abnormal. Tricuspid valve regurgitation is severe. Aortic Valve: The aortic valve is tricuspid. Aortic valve regurgitation is not visualized. Aortic valve sclerosis is present, with no evidence of aortic valve stenosis. Aortic valve peak gradient measures 5.9 mmHg. Pulmonic Valve: The pulmonic valve was not well visualized. Pulmonic valve regurgitation is not visualized. Aorta: The aortic root and ascending aorta are structurally normal, with no evidence of dilitation. Venous: The inferior vena cava is dilated in size with less than 50% respiratory variability, suggesting right atrial pressure of 15 mmHg. IAS/Shunts: The interatrial septum was not well visualized.  LEFT VENTRICLE PLAX 2D LVIDd:         4.10 cm   Diastology LVIDs:         3.00 cm   LV e' medial:    4.26 cm/s LV PW:         0.80 cm   LV E/e' medial:  21.4 LV IVS:        0.90 cm   LV e' lateral:   6.12 cm/s LVOT diam:     2.10 cm   LV E/e' lateral: 14.9 LV SV:         59 LV SV Index:   26 LVOT Area:     3.46 cm  RIGHT VENTRICLE             IVC RV S prime:     11.40 cm/s  IVC diam: 2.20 cm TAPSE (M-mode): 1.3 cm LEFT ATRIUM             Index        RIGHT ATRIUM           Index LA diam:        4.00 cm 1.76 cm/m    RA Area:     24.10 cm LA Vol (A2C):   98.8 ml 43.56 ml/m  RA Volume:   79.90 ml  35.24 ml/m LA Vol (A4C):   84.0 ml 37.05 ml/m LA Biplane Vol: 98.3 ml 43.36 ml/m  AORTIC VALVE AV Area (Vmax): 2.61 cm AV Vmax:        121.00 cm/s AV Peak Grad:   5.9 mmHg LVOT Vmax:      91.20 cm/s LVOT Vmean:     58.700 cm/s LVOT VTI:       0.169 m  AORTA Ao Root diam: 3.30 cm Ao Asc diam:  3.10 cm MITRAL VALVE               TRICUSPID VALVE MV Area (PHT):  3.65 cm    TR Peak grad:   31.1 mmHg MV Decel Time: 208 msec    TR Vmax:        279.00 cm/s MV E velocity: 91.20 cm/s MV A velocity: 28.30 cm/s  SHUNTS MV E/A ratio:  3.22        Systemic VTI:  0.17 m                            Systemic Diam: 2.10 cm Carson Clara MD Electronically signed by Carson Clara MD Signature Date/Time: 04/21/2024/10:28:45 PM    Final    DG Chest Port 1 View Result Date: 04/20/2024 CLINICAL DATA:  sob and leg swelling EXAM: PORTABLE CHEST - 1 VIEW COMPARISON:  March 19, 2024 FINDINGS: Central pulmonary vascular congestion. No focal airspace consolidation, pleural effusion, or pneumothorax. Unchanged cardiomegaly. Left chest pacemaker with leads terminating in the right atrium and right ventricle. No acute fracture or destructive lesion. Multilevel thoracic osteophytosis. Surgical clips in the left neck. Osteopenia. IMPRESSION: Unchanged cardiomegaly with findings of central pulmonary vascular congestion. Electronically Signed   By: Rance Burrows M.D.   On: 04/20/2024 15:44    Cardiac Studies  Echo pending    Patient Profile     89 y.o. female with PAF, Hypertension admitted for PAF and diastolic heart failure  Assessment & Plan    Paroxysmal atrial fibrillation  - in sinus rhythm/paced rhythm, on amiodarone . Has a short brief episode of increased heart rate this morning. Eliquis  restarted this morning by primary team - we will monitor hgb closely. Her CHADs2VAsc score is 4.  Cardizem  stopped yesterday to allow for blood  pressure. Will continue to monitor closely.   Hypertension - blood pressure at target.  Heart failure with preserved ejection fraction- she is on IVLasix , cr seems to be worsening. If it increases on today's blood work please hold the lasix . Echo done yesterday - EF is normal but noted pulmonary hypertension.  Midodrine 5 mg TID added - has not really helped will increase this to 10 mg TID.   CAD - no angina. Continue with statin. No plans for any ischemic evaluation.  AKI on CKD - Her cr is 3.03 today , Nephro is on board.    PPM - paced this morning    For questions or updates, please contact [facility] Please consult [email or web address] for contact info under Cardiology/STEMI.      Signed, Sydney Hasten, DO  04/22/2024, 10:51 AM

## 2024-04-23 DIAGNOSIS — N1832 Chronic kidney disease, stage 3b: Secondary | ICD-10-CM | POA: Diagnosis not present

## 2024-04-23 DIAGNOSIS — I5033 Acute on chronic diastolic (congestive) heart failure: Secondary | ICD-10-CM | POA: Diagnosis not present

## 2024-04-23 DIAGNOSIS — I495 Sick sinus syndrome: Secondary | ICD-10-CM | POA: Diagnosis not present

## 2024-04-23 DIAGNOSIS — N170 Acute kidney failure with tubular necrosis: Secondary | ICD-10-CM | POA: Diagnosis not present

## 2024-04-23 LAB — CBC
HCT: 27.4 % — ABNORMAL LOW (ref 36.0–46.0)
Hemoglobin: 8.3 g/dL — ABNORMAL LOW (ref 12.0–15.0)
MCH: 32.7 pg (ref 26.0–34.0)
MCHC: 30.3 g/dL (ref 30.0–36.0)
MCV: 107.9 fL — ABNORMAL HIGH (ref 80.0–100.0)
Platelets: 231 10*3/uL (ref 150–400)
RBC: 2.54 MIL/uL — ABNORMAL LOW (ref 3.87–5.11)
RDW: 17.9 % — ABNORMAL HIGH (ref 11.5–15.5)
WBC: 6.2 10*3/uL (ref 4.0–10.5)
nRBC: 0.5 % — ABNORMAL HIGH (ref 0.0–0.2)

## 2024-04-23 LAB — BASIC METABOLIC PANEL WITH GFR
Anion gap: 14 (ref 5–15)
BUN: 41 mg/dL — ABNORMAL HIGH (ref 8–23)
CO2: 22 mmol/L (ref 22–32)
Calcium: 9.9 mg/dL (ref 8.9–10.3)
Chloride: 100 mmol/L (ref 98–111)
Creatinine, Ser: 2.85 mg/dL — ABNORMAL HIGH (ref 0.44–1.00)
GFR, Estimated: 15 mL/min — ABNORMAL LOW (ref >60)
Glucose, Bld: 176 mg/dL — ABNORMAL HIGH (ref 70–99)
Potassium: 3.8 mmol/L (ref 3.5–5.1)
Sodium: 136 mmol/L (ref 135–145)

## 2024-04-23 LAB — GLUCOSE, CAPILLARY
Glucose-Capillary: 166 mg/dL — ABNORMAL HIGH (ref 70–99)
Glucose-Capillary: 260 mg/dL — ABNORMAL HIGH (ref 70–99)
Glucose-Capillary: 242 mg/dL — ABNORMAL HIGH (ref 70–99)

## 2024-04-23 NOTE — Progress Notes (Signed)
 Heart Failure Navigator Progress Note  Assessed for Heart & Vascular TOC clinic readiness.  Patient with EF 60-65%, admitted with AKI on CKD IIIb and on midodrine for BP support. Planning to restart diuretics tomorrow per renal. Will not schedule HF TOC at this time.   Navigator available for reassessment of patient.   Jerilyn Monte, PharmD, BCPS Heart Failure Stewardship Pharmacist Phone [phone]

## 2024-04-23 NOTE — Progress Notes (Signed)
 PROGRESS NOTE    CONTESSA URSIN  QMV:[id number] DOB: Jun 07, 1932 DOA: 04/20/2024 PCP: Tena Feeling, MD    Brief Narrative:  89 year old with history of extensive cardiac issues including paroxysmal A-fib, sick sinus syndrome status post pacemaker chronically on Eliquis , type 2 diabetes on metformin , coronary artery disease status post stenting, morbid obesity, hypertension hyperlipidemia and CKD stage IIIb presented to the emergency room with about 1 month of gradually worsening leg swelling and gradual decrease in mobility.  In the emergency room on room air.  Blood pressure stable.  Hemoglobin 9 with recent hemoglobin of 11.  Creatinine 3.5 with recent creatinine of 1.6.  Chest x-ray with chronic cardiomegaly and prominent bronchovascular markings.  BNP was 2719.  Admitted with fluid overload, AKI and anemia.  Subjective: Patient seen and examined.  Denies any complaints at rest.  She is not sure she will like to go to skilled nursing facility.  She would like to go back to [facility] and do physical therapy if possible. Not mobilized yet. Telemetry monitor with paced rhythm.  Heart rate is well-controlled. Creatinine 2.85.  Urine output is not was measured but only 450.  Not on diuretics.  Assessment & Plan:   Acute on chronic diastolic heart failure: Fluid overload, multifactorial. Initially treated with IV Lasix  and then discontinued to improve hemodynamics.  Diuretics currently on hold. home dose of Toprol  XL 25 mg daily. Holding ramipril  due to AKI. Further diuretic dosing as per cardiology team.  Still on hold. Repeat echocardiogram with normal ejection fraction, grade 3 diastolic dysfunction.   AKI on CKD stage IIIb: Probably multifactorial and prerenal. Recent baseline creatinine 1.6.  Creatinine 3.5.-3-2.85 today. Strict intake output monitoring.  Potassium is adequate. Holding Jardiance, ramipril  and metformin .  Nephrology following.  Started on midodrine 10 mg 3 times daily  with improvement of hemodynamics.   Acute on chronic iron deficiency anemia: Patient with known hemorrhoids and intermittently bleeding. EGD, colonoscopy reviewed from 2021.  She has internal and external hemorrhoids. Started patient on steroid suppositories. Back on Eliquis .  No evidence of bleeding.   Chronic A-fib, tachybradycardia syndrome status post pacemaker: Currently stable.  Rate is controlled.  Metoprolol  resumed.  Cardizem  on hold to improve blood pressures.  Tolerating resumption of Eliquis .   Type 2 diabetes: Well-controlled.  On metformin  at home.  Remains on SSI.   Hypertension: As above.  At risk of hypotension.  Continuing Toprol  XL with holding parameters.  Discontinued ramipril  and Cardizem .   Hypothyroidism: On Synthroid .   DVT prophylaxis: apixaban  (ELIQUIS ) tablet 2.5 mg Start: 04/22/24 1000 apixaban  (ELIQUIS ) tablet 2.5 mg   Code Status: Full code Family Communication: None at the bedside.  Patient's daughter-in-law was called and updated 5/11. Disposition Plan: Status is: Inpatient Remains inpatient appropriate because: Medication management, renal function recovery   Consultants:  Cardiology Nephrology  Procedures:  None   Antimicrobials:  None     Objective: Vitals:   04/22/24 2004 04/23/24 0115 04/23/24 0438 04/23/24 0742  BP: (!) 108/53 (!) 123/54 (!) 106/41 (!) 109/59  Pulse: (!) 59 (!) 59 61 62  Resp: 18 17 17 20   Temp: 97.7 F (36.5 C) (!) 97.4 F (36.3 C) 97.7 F (36.5 C) 97.8 F (36.6 C)  TempSrc: Oral Oral Oral Oral  SpO2: 96% 97% 94% 96%  Weight:   118.8 kg   Height:        Intake/Output Summary (Last 24 hours) at 04/23/2024 0957 Last data filed at 04/22/2024 2000 Gross per 24 hour  Intake --  Output 450 ml  Net -450 ml   Filed Weights   04/20/24 2200 04/21/24 0527 04/23/24 0438  Weight: 118.7 kg 118.7 kg 118.8 kg    Examination:  General exam: Appears calm and comfortable , on room air.  She has blindness with  perception of image only. Respiratory system: Clear to auscultation. Respiratory effort normal. No added sounds. Cardiovascular system: S1 & S2 heard, RRR.  3+ bilateral pedal edema. Gastrointestinal system: Abdomen is nondistended, soft and nontender. No organomegaly or masses felt. Normal bowel sounds heard. Central nervous system: Alert and oriented. No focal neurological deficits. Extremities: Symmetric 5 x 5 power.  Generalized weakness.    Data Reviewed: I have personally reviewed following labs and imaging studies  CBC: Recent Labs  Lab 04/20/24 1423 04/20/24 1430 04/21/24 0843 04/23/24 0324  WBC 5.6  --  5.5 6.2  NEUTROABS 4.2  --   --   --   HGB 8.5* 9.2* 8.4* 8.3*  HCT 27.6* 27.0* 26.5* 27.4*  MCV 106.6*  --  104.3* 107.9*  PLT 222  --  228 231   Basic Metabolic Panel: Recent Labs  Lab 04/20/24 1423 04/20/24 1430 04/21/24 1159 04/22/24 0250 04/23/24 0324  NA 137 137 136 138 136  K 3.5 3.5 3.4* 4.0 3.8  CL 97* 100 99 99 100  CO2 21*  --  23 23 22   GLUCOSE 226* 222* 200* 202* 176*  BUN 46* 40* 42* 42* 41*  CREATININE 3.16* 3.50* 2.95* 3.03* 2.85*  CALCIUM 10.2  --  10.0 10.3 9.9   GFR: Estimated Creatinine Clearance: 17.9 mL/min (A) (by C-G formula based on SCr of 2.85 mg/dL (H)). Liver Function Tests: Recent Labs  Lab 04/20/24 1423  AST 62*  ALT 25  ALKPHOS 70  BILITOT 1.0  PROT 8.5*  ALBUMIN 2.9*   No results for input(s): "LIPASE", "AMYLASE" in the last 168 hours. No results for input(s): "AMMONIA" in the last 168 hours. Coagulation Profile: No results for input(s): "INR", "PROTIME" in the last 168 hours. Cardiac Enzymes: No results for input(s): "CKTOTAL", "CKMB", "CKMBINDEX", "TROPONINI" in the last 168 hours. BNP (last 3 results) No results for input(s): "PROBNP" in the last 8760 hours. HbA1C: No results for input(s): "HGBA1C" in the last 72 hours. CBG: Recent Labs  Lab 04/21/24 2235 04/22/24 0615 04/22/24 1223 04/22/24 1544  04/23/24 0554  GLUCAP 185* 171* 237* 204* 166*   Lipid Profile: No results for input(s): "CHOL", "HDL", "LDLCALC", "TRIG", "CHOLHDL", "LDLDIRECT" in the last 72 hours. Thyroid  Function Tests: No results for input(s): "TSH", "T4TOTAL", "FREET4", "T3FREE", "THYROIDAB" in the last 72 hours. Anemia Panel: Recent Labs    04/21/24 1159  FERRITIN 85  TIBC 354  IRON 41   Sepsis Labs: No results for input(s): "PROCALCITON", "LATICACIDVEN" in the last 168 hours.  No results found for this or any previous visit (from the past 240 hours).       Radiology Studies: ECHOCARDIOGRAM COMPLETE Result Date: 04/21/2024    ECHOCARDIOGRAM REPORT   Patient Name:   AJANAI VICKS Date of Exam: 04/21/2024 Medical Rec #:  [id number]      Height:       67.0 in Accession #:    [id number]     Weight:       261.7 lb Date of Birth:  19-Dec-1931       BSA:          2.267 m Patient Age:    75 years  BP:           104/55 mmHg Patient Gender: F              HR:           61 bpm. Exam Location:  Inpatient Procedure: 2D Echo, Cardiac Doppler and Color Doppler (Both Spectral and Color            Flow Doppler were utilized during procedure). Indications:    CHF- Acute Diastolic I50.31  History:        Patient has prior history of Echocardiogram examinations, most                 recent 10/31/2020. CHF, Pacemaker, Arrythmias:Atrial                 Fibrillation, Atrial Flutter, Tachycardia and Bradycardia,                 Signs/Symptoms:Chest Pain; Risk Factors:Hypertension, Diabetes                 and Dyslipidemia.  Sonographer:    Terrilee Few RCS Referring Phys: [id number] Madalena Kesecker IMPRESSIONS  1. Left ventricular ejection fraction, by estimation, is 60 to 65%. The left ventricle has normal function. The left ventricle has no regional wall motion abnormalities. There is mild left ventricular hypertrophy. Left ventricular diastolic parameters are consistent with Grade III diastolic dysfunction (restrictive). Elevated  left atrial pressure.  2. Right ventricular systolic function is normal. The right ventricular size is normal. There is moderately elevated pulmonary artery systolic pressure. The estimated right ventricular systolic pressure is 46.1 mmHg.  3. Left atrial size was moderately dilated.  4. Right atrial size was mildly dilated.  5. The mitral valve is degenerative. Mild mitral valve regurgitation. No evidence of mitral stenosis. Moderate mitral annular calcification.  6. The tricuspid valve is abnormal. Tricuspid valve regurgitation is severe.  7. The aortic valve is tricuspid. Aortic valve regurgitation is not visualized. Aortic valve sclerosis is present, with no evidence of aortic valve stenosis.  8. The inferior vena cava is dilated in size with <50% respiratory variability, suggesting right atrial pressure of 15 mmHg. FINDINGS  Left Ventricle: Left ventricular ejection fraction, by estimation, is 60 to 65%. The left ventricle has normal function. The left ventricle has no regional wall motion abnormalities. The left ventricular internal cavity size was normal in size. There is  mild left ventricular hypertrophy. Left ventricular diastolic parameters are consistent with Grade III diastolic dysfunction (restrictive). Elevated left atrial pressure. Right Ventricle: The right ventricular size is normal. No increase in right ventricular wall thickness. Right ventricular systolic function is normal. There is moderately elevated pulmonary artery systolic pressure. The tricuspid regurgitant velocity is 2.79 m/s, and with an assumed right atrial pressure of 15 mmHg, the estimated right ventricular systolic pressure is 46.1 mmHg. Left Atrium: Left atrial size was moderately dilated. Right Atrium: Right atrial size was mildly dilated. Pericardium: Trivial pericardial effusion is present. Mitral Valve: The mitral valve is degenerative in appearance. Moderate mitral annular calcification. Mild mitral valve regurgitation. No  evidence of mitral valve stenosis. Tricuspid Valve: The tricuspid valve is abnormal. Tricuspid valve regurgitation is severe. Aortic Valve: The aortic valve is tricuspid. Aortic valve regurgitation is not visualized. Aortic valve sclerosis is present, with no evidence of aortic valve stenosis. Aortic valve peak gradient measures 5.9 mmHg. Pulmonic Valve: The pulmonic valve was not well visualized. Pulmonic valve regurgitation is not visualized. Aorta: The aortic root and ascending aorta  are structurally normal, with no evidence of dilitation. Venous: The inferior vena cava is dilated in size with less than 50% respiratory variability, suggesting right atrial pressure of 15 mmHg. IAS/Shunts: The interatrial septum was not well visualized.  LEFT VENTRICLE PLAX 2D LVIDd:         4.10 cm   Diastology LVIDs:         3.00 cm   LV e' medial:    4.26 cm/s LV PW:         0.80 cm   LV E/e' medial:  21.4 LV IVS:        0.90 cm   LV e' lateral:   6.12 cm/s LVOT diam:     2.10 cm   LV E/e' lateral: 14.9 LV SV:         59 LV SV Index:   26 LVOT Area:     3.46 cm  RIGHT VENTRICLE             IVC RV S prime:     11.40 cm/s  IVC diam: 2.20 cm TAPSE (M-mode): 1.3 cm LEFT ATRIUM             Index        RIGHT ATRIUM           Index LA diam:        4.00 cm 1.76 cm/m   RA Area:     24.10 cm LA Vol (A2C):   98.8 ml 43.56 ml/m  RA Volume:   79.90 ml  35.24 ml/m LA Vol (A4C):   84.0 ml 37.05 ml/m LA Biplane Vol: 98.3 ml 43.36 ml/m  AORTIC VALVE AV Area (Vmax): 2.61 cm AV Vmax:        121.00 cm/s AV Peak Grad:   5.9 mmHg LVOT Vmax:      91.20 cm/s LVOT Vmean:     58.700 cm/s LVOT VTI:       0.169 m  AORTA Ao Root diam: 3.30 cm Ao Asc diam:  3.10 cm MITRAL VALVE               TRICUSPID VALVE MV Area (PHT): 3.65 cm    TR Peak grad:   31.1 mmHg MV Decel Time: 208 msec    TR Vmax:        279.00 cm/s MV E velocity: 91.20 cm/s MV A velocity: 28.30 cm/s  SHUNTS MV E/A ratio:  3.22        Systemic VTI:  0.17 m                             Systemic Diam: 2.10 cm Carson Clara MD Electronically signed by Carson Clara MD Signature Date/Time: 04/21/2024/10:28:45 PM    Final         Scheduled Meds:  amiodarone   200 mg Oral Daily   apixaban   2.5 mg Oral BID   ascorbic acid   500 mg Oral Daily   buPROPion   100 mg Oral Daily   calcium carbonate  1 tablet Oral BID WC   cholecalciferol   1,000 Units Oral Daily   cyanocobalamin   1,000 mcg Oral Daily   feeding supplement  237 mL Oral BID BM   ferrous sulfate   325 mg Oral Daily   fluticasone  1 spray Each Nare Daily   gemfibrozil   600 mg Oral BID   hydrocortisone  25 mg Rectal BID   insulin  aspart  0-9 Units Subcutaneous TID  WC   levothyroxine   50 mcg Oral Q0600   metoprolol  succinate  25 mg Oral Daily   midodrine  10 mg Oral TID WC   pantoprazole   40 mg Oral BID AC   polyethylene glycol  17 g Oral Daily   potassium chloride   40 mEq Oral Daily   pravastatin   80 mg Oral q1800   Continuous Infusions:   LOS: 3 days       Estefani Bateson, MD [facility]

## 2024-04-23 NOTE — TOC Initial Note (Signed)
 Transition of Care [facility]) - Initial/Assessment Note    Patient Details  Name: Kirsten Jensen MRN: [id number] Date of Birth: May 15, 1932  [facility] CM/SW Contact:    Arron Big, LCSWA Phone Number: 04/23/2024, 12:13 PM  Clinical Narrative:  CSW spoke with [facility] nurse, Rowena, in regards to potential DC tomorrow. Facility is able to accept patient back and will need updated FL2, dc summary, and HH order.   TOC will continue to follow.                   Expected Discharge Plan: Assisted Living Barriers to Discharge: Continued Medical Work up   Patient Goals and CMS Choice Patient states their goals for this hospitalization and ongoing recovery are:: To return to [facility]          Expected Discharge Plan and Services In-house Referral: Clinical Social Work     Living arrangements for the past 2 months: Assisted Living Facility                                      Prior Living Arrangements/Services Living arrangements for the past 2 months: Assisted Living Facility Lives with:: Facility Resident Patient language and need for interpreter reviewed:: No Do you feel safe going back to the place where you live?: Yes      Need for Family Participation in Patient Care: Yes (Comment) Care giver support system in place?: Yes (comment)   Criminal Activity/Legal Involvement Pertinent to Current Situation/Hospitalization: No - Comment as needed  Activities of Daily Living   ADL Screening (condition at time of admission) Independently performs ADLs?: Yes (appropriate for developmental age) Is the patient deaf or have difficulty hearing?: No Does the patient have difficulty seeing, even when wearing glasses/contacts?: Yes Does the patient have difficulty concentrating, remembering, or making decisions?: No  Permission Sought/Granted Permission sought to share information with : Facility Industrial/product designer granted to share  information with : Yes, Verbal Permission Granted (Patient from the facility)     Permission granted to share info w AGENCY: [facility]        Emotional Assessment Appearance:: Appears stated age Attitude/Demeanor/Rapport: Engaged Affect (typically observed): Calm Orientation: : Oriented to Self, Oriented to Place, Oriented to  Time, Oriented to Situation Alcohol / Substance Use: Not Applicable Psych Involvement: No (comment)  Admission diagnosis:  Peripheral edema [R60.0] CHF exacerbation (HCC) [I50.9] AKI (acute kidney injury) (HCC) [N17.9] Patient Active Problem List   Diagnosis Date Noted   Acute on chronic diastolic heart failure (HCC) 04/23/2024   CHF exacerbation (HCC) 04/20/2024   Diabetes mellitus type 2, controlled, with complications (HCC) 03/25/2021   Diabetic nephropathy (HCC) 03/25/2021   AF (paroxysmal atrial fibrillation) (HCC) 03/25/2021   Acute lower UTI 03/25/2021   Acute renal failure superimposed on stage 3b chronic kidney disease (HCC) 03/23/2021   Hypothyroidism 03/23/2021   Abdominal pain with vomiting 03/23/2021   Dysuria 03/23/2021   Chronic diastolic CHF (congestive heart failure) (HCC)    Gastrointestinal hemorrhage 10/31/2020   Chronic atrial fibrillation (HCC)    Advanced nonexudative age-related macular degeneration of both eyes with subfoveal involvement 09/11/2020   Exudative age-related macular degeneration of both eyes with inactive scar (HCC) 09/11/2020   Diabetes mellitus without complication (HCC) 09/11/2020   Chest pain 04/05/2020   Type 2 diabetes mellitus with hypoglycemia without coma (HCC) 04/05/2020   HTN (hypertension) 04/05/2020  Dyslipidemia 04/05/2020   Morbid obesity with BMI of 45.0-49.9, adult (HCC) 04/05/2020   Tachycardia-bradycardia syndrome (HCC) 03/17/2020   Pacemaker 03/17/2020   Atypical atrial flutter (HCC) 02/05/2020   Paroxysmal atrial fibrillation (HCC) 11/06/2019   Secondary hypercoagulable state (HCC)  11/06/2019   PCP:  Tena Feeling, MD Pharmacy:   [facility], [place] - [address] [place] [address] Phone: [phone] Fax: [phone]  [facility] - [place], [place] - [place] [address] [place] [address] Phone: [phone] Fax: [phone]     Social Drivers of Health (SDOH) Social History: SDOH Screenings   Food Insecurity: No Food Insecurity (04/20/2024)  Housing: Low Risk  (04/20/2024)  Transportation Needs: No Transportation Needs (04/20/2024)  Utilities: Not At Risk (04/20/2024)  Social Connections: Unknown (04/20/2024)  Tobacco Use: Low Risk  (04/20/2024)   SDOH Interventions:     Readmission Risk Interventions     No data to display

## 2024-04-23 NOTE — Progress Notes (Signed)
 Pt. Refusing to get up at this time.

## 2024-04-23 NOTE — Progress Notes (Signed)
 Progress Note  Patient Name: Kirsten Jensen Date of Encounter: 04/23/2024  Primary Cardiologist: Richardo Chandler, MD   Subjective   Patient seen and examined at her bedside.   Inpatient Medications    Scheduled Meds:  amiodarone   200 mg Oral Daily   apixaban   2.5 mg Oral BID   ascorbic acid   500 mg Oral Daily   buPROPion   100 mg Oral Daily   calcium carbonate  1 tablet Oral BID WC   cholecalciferol   1,000 Units Oral Daily   cyanocobalamin   1,000 mcg Oral Daily   feeding supplement  237 mL Oral BID BM   ferrous sulfate   325 mg Oral Daily   fluticasone  1 spray Each Nare Daily   gemfibrozil   600 mg Oral BID   hydrocortisone  25 mg Rectal BID   insulin  aspart  0-9 Units Subcutaneous TID WC   levothyroxine   50 mcg Oral Q0600   metoprolol  succinate  25 mg Oral Daily   midodrine  10 mg Oral TID WC   pantoprazole   40 mg Oral BID AC   polyethylene glycol  17 g Oral Daily   potassium chloride   40 mEq Oral Daily   pravastatin   80 mg Oral q1800   Continuous Infusions:  PRN Meds: acetaminophen  **OR** acetaminophen , ondansetron  (ZOFRAN ) IV, tiZANidine   Vital Signs    Vitals:   04/22/24 2004 04/23/24 0115 04/23/24 0438 04/23/24 0742  BP: (!) 108/53 (!) 123/54 (!) 106/41 (!) 109/59  Pulse: (!) 59 (!) 59 61 62  Resp: 18 17 17 20   Temp: 97.7 F (36.5 C) (!) 97.4 F (36.3 C) 97.7 F (36.5 C) 97.8 F (36.6 C)  TempSrc: Oral Oral Oral Oral  SpO2: 96% 97% 94% 96%  Weight:   118.8 kg   Height:        Intake/Output Summary (Last 24 hours) at 04/23/2024 1120 Last data filed at 04/22/2024 2000 Gross per 24 hour  Intake --  Output 450 ml  Net -450 ml   Filed Weights   04/20/24 2200 04/21/24 0527 04/23/24 0438  Weight: 118.7 kg 118.7 kg 118.8 kg    Telemetry    Paced rhythm  - Personally Reviewed  ECG     - Personally Reviewed  Physical Exam     General: Comfortable, legally blind Head: Atraumatic, normal size  Eyes: PEERLA, EOMI  Neck: Supple, normal  JVD Cardiac: Normal S1, S2; RRR; no murmurs, rubs, or gallops Lungs: Clear to auscultation bilaterally Abd: Soft, nontender, no hepatomegaly  Ext: warm, no edema Musculoskeletal: No deformities, BUE and BLE strength normal and equal Skin: Warm and dry, no rashes     Labs    Chemistry Recent Labs  Lab 04/20/24 1423 04/20/24 1430 04/21/24 1159 04/22/24 0250 04/23/24 0324  NA 137   < > 136 138 136  K 3.5   < > 3.4* 4.0 3.8  CL 97*   < > 99 99 100  CO2 21*  --  23 23 22   GLUCOSE 226*   < > 200* 202* 176*  BUN 46*   < > 42* 42* 41*  CREATININE 3.16*   < > 2.95* 3.03* 2.85*  CALCIUM 10.2  --  10.0 10.3 9.9  PROT 8.5*  --   --   --   --   ALBUMIN 2.9*  --   --   --   --   AST 62*  --   --   --   --  ALT 25  --   --   --   --   ALKPHOS 70  --   --   --   --   BILITOT 1.0  --   --   --   --   GFRNONAA 14*  --  15* 14* 15*  ANIONGAP 19*  --  14 16* 14   < > = values in this interval not displayed.     Hematology Recent Labs  Lab 04/20/24 1423 04/20/24 1430 04/21/24 0843 04/23/24 0324  WBC 5.6  --  5.5 6.2  RBC 2.59*  --  2.54* 2.54*  HGB 8.5* 9.2* 8.4* 8.3*  HCT 27.6* 27.0* 26.5* 27.4*  MCV 106.6*  --  104.3* 107.9*  MCH 32.8  --  33.1 32.7  MCHC 30.8  --  31.7 30.3  RDW 17.9*  --  17.9* 17.9*  PLT 222  --  228 231    Cardiac EnzymesNo results for input(s): "TROPONINI" in the last 168 hours. No results for input(s): "TROPIPOC" in the last 168 hours.   BNP Recent Labs  Lab 04/20/24 1423  BNP 2,719.9*     DDimer No results for input(s): "DDIMER" in the last 168 hours.   Radiology    ECHOCARDIOGRAM COMPLETE Result Date: 04/21/2024    ECHOCARDIOGRAM REPORT   Patient Name:   Kirsten Jensen Date of Exam: 04/21/2024 Medical Rec #:  [id number]      Height:       67.0 in Accession #:    [id number]     Weight:       261.7 lb Date of Birth:  [birth year]       BSA:          2.267 m Patient Age:    89 years       BP:           104/55 mmHg Patient Gender: F              HR:            61 bpm. Exam Location:  Inpatient Procedure: 2D Echo, Cardiac Doppler and Color Doppler (Both Spectral and Color            Flow Doppler were utilized during procedure). Indications:    CHF- Acute Diastolic I50.31  History:        Patient has prior history of Echocardiogram examinations, most                 recent 10/31/2020. CHF, Pacemaker, Arrythmias:Atrial                 Fibrillation, Atrial Flutter, Tachycardia and Bradycardia,                 Signs/Symptoms:Chest Pain; Risk Factors:Hypertension, Diabetes                 and Dyslipidemia.  Sonographer:    Terrilee Few RCS Referring Phys: [id number] KUBER GHIMIRE IMPRESSIONS  1. Left ventricular ejection fraction, by estimation, is 60 to 65%. The left ventricle has normal function. The left ventricle has no regional wall motion abnormalities. There is mild left ventricular hypertrophy. Left ventricular diastolic parameters are consistent with Grade III diastolic dysfunction (restrictive). Elevated left atrial pressure.  2. Right ventricular systolic function is normal. The right ventricular size is normal. There is moderately elevated pulmonary artery systolic pressure. The estimated right ventricular systolic pressure is 46.1 mmHg.  3. Left atrial size was moderately dilated.  4. Right  atrial size was mildly dilated.  5. The mitral valve is degenerative. Mild mitral valve regurgitation. No evidence of mitral stenosis. Moderate mitral annular calcification.  6. The tricuspid valve is abnormal. Tricuspid valve regurgitation is severe.  7. The aortic valve is tricuspid. Aortic valve regurgitation is not visualized. Aortic valve sclerosis is present, with no evidence of aortic valve stenosis.  8. The inferior vena cava is dilated in size with <50% respiratory variability, suggesting right atrial pressure of 15 mmHg. FINDINGS  Left Ventricle: Left ventricular ejection fraction, by estimation, is 60 to 65%. The left ventricle has normal function. The left  ventricle has no regional wall motion abnormalities. The left ventricular internal cavity size was normal in size. There is  mild left ventricular hypertrophy. Left ventricular diastolic parameters are consistent with Grade III diastolic dysfunction (restrictive). Elevated left atrial pressure. Right Ventricle: The right ventricular size is normal. No increase in right ventricular wall thickness. Right ventricular systolic function is normal. There is moderately elevated pulmonary artery systolic pressure. The tricuspid regurgitant velocity is 2.79 m/s, and with an assumed right atrial pressure of 15 mmHg, the estimated right ventricular systolic pressure is 46.1 mmHg. Left Atrium: Left atrial size was moderately dilated. Right Atrium: Right atrial size was mildly dilated. Pericardium: Trivial pericardial effusion is present. Mitral Valve: The mitral valve is degenerative in appearance. Moderate mitral annular calcification. Mild mitral valve regurgitation. No evidence of mitral valve stenosis. Tricuspid Valve: The tricuspid valve is abnormal. Tricuspid valve regurgitation is severe. Aortic Valve: The aortic valve is tricuspid. Aortic valve regurgitation is not visualized. Aortic valve sclerosis is present, with no evidence of aortic valve stenosis. Aortic valve peak gradient measures 5.9 mmHg. Pulmonic Valve: The pulmonic valve was not well visualized. Pulmonic valve regurgitation is not visualized. Aorta: The aortic root and ascending aorta are structurally normal, with no evidence of dilitation. Venous: The inferior vena cava is dilated in size with less than 50% respiratory variability, suggesting right atrial pressure of 15 mmHg. IAS/Shunts: The interatrial septum was not well visualized.  LEFT VENTRICLE PLAX 2D LVIDd:         4.10 cm   Diastology LVIDs:         3.00 cm   LV e' medial:    4.26 cm/s LV PW:         0.80 cm   LV E/e' medial:  21.4 LV IVS:        0.90 cm   LV e' lateral:   6.12 cm/s LVOT diam:      2.10 cm   LV E/e' lateral: 14.9 LV SV:         59 LV SV Index:   26 LVOT Area:     3.46 cm  RIGHT VENTRICLE             IVC RV S prime:     11.40 cm/s  IVC diam: 2.20 cm TAPSE (M-mode): 1.3 cm LEFT ATRIUM             Index        RIGHT ATRIUM           Index LA diam:        4.00 cm 1.76 cm/m   RA Area:     24.10 cm LA Vol (A2C):   98.8 ml 43.56 ml/m  RA Volume:   79.90 ml  35.24 ml/m LA Vol (A4C):   84.0 ml 37.05 ml/m LA Biplane Vol: 98.3 ml 43.36 ml/m  AORTIC VALVE AV  Area (Vmax): 2.61 cm AV Vmax:        121.00 cm/s AV Peak Grad:   5.9 mmHg LVOT Vmax:      91.20 cm/s LVOT Vmean:     58.700 cm/s LVOT VTI:       0.169 m  AORTA Ao Root diam: 3.30 cm Ao Asc diam:  3.10 cm MITRAL VALVE               TRICUSPID VALVE MV Area (PHT): 3.65 cm    TR Peak grad:   31.1 mmHg MV Decel Time: 208 msec    TR Vmax:        279.00 cm/s MV E velocity: 91.20 cm/s MV A velocity: 28.30 cm/s  SHUNTS MV E/A ratio:  3.22        Systemic VTI:  0.17 m                            Systemic Diam: 2.10 cm Carson Clara MD Electronically signed by Carson Clara MD Signature Date/Time: 04/21/2024/10:28:45 PM    Final     Cardiac Studies  Echo pending    Patient Profile     89 y.o. female with PAF, Hypertension admitted for PAF and diastolic heart failure  Assessment & Plan    Paroxysmal atrial fibrillation  - in sinus rhythm/paced rhythm, on amiodarone . Has a short brief episode of increased heart rate this morning. Eliquis  restarted hemoglobin remains stable - we will monitor hgb closely. Her CHADs2VAsc score is 4.   Cardizem  stopped  to allow for blood pressure. Will continue to monitor closely. Heart rate is still within normal  Hypertension - blood pressure improving but still low  Heart failure with preserved ejection fraction- cr is improved today -  recent Echo EF is normal but noted pulmonary hypertension. Will plan to restart Lasix  tomorrow if cr remains stable or improves  Midodrine increased to 10  mg TID.  CAD - no angina. Continue with statin. No plans for any ischemic evaluation.  AKI on CKD - Her cr is 2.85 today , Nephro is on board.    PPM - paced this morning    For questions or updates, please contact [facility] Please consult [email or web address] for contact info under Cardiology/STEMI.      Signed, Domani Bakos, DO  04/23/2024, 11:20 AM

## 2024-04-23 NOTE — Progress Notes (Signed)
 Mobility Specialist: Progress Note   04/23/24 1559  Mobility  Activity Ambulated with assistance to bathroom  Level of Assistance Moderate assist, patient does 50-74%  Assistive Device Front wheel walker  Distance Ambulated (ft) 30 ft  Activity Response Tolerated well  Mobility Referral Yes  Mobility visit 1 Mobility  Mobility Specialist Start Time (ACUTE ONLY) 1445  Mobility Specialist Stop Time (ACUTE ONLY) 1528  Mobility Specialist Time Calculation (min) (ACUTE ONLY) 43 min    Pt was agreeable to mobility session - received in bed. Requested to use the BR. ModA for bed mobility to assist with trunk elevation. Heavy modA for STS. CG for ambulation to BR. C/o dizziness and nausea while sitting EOB and on commode. Returned to bed. Left in supine with all needs met, call bell in reach.   Deloria Fetch Mobility Specialist Please contact via SecureChat or Rehab office at [phone]

## 2024-04-23 NOTE — Evaluation (Signed)
 Occupational Therapy Evaluation Patient Details Name: Kirsten Jensen MRN: [id number] DOB: [birth year] Today's Date: 04/23/2024   History of Present Illness   Kirsten Jensen is a 89 y.o. female admitted 04/20/24 for CHF exacerbation. Pt presented with worsening LE swelling, weakness, and SOB. Chest x-ray with chronic cardiomegaly, prominent bronchovascular markings. Found to have AKI, anemia, and fluid overloaded. PMH significant for paroxysmal A-fib, sick sinus syndrome s/p pacemaker, T2DM, CAD s/p stenting, morbid obesity, hypertension, dyslipidemia, and CKD stage IIIb.     Clinical Impressions Pt presents with decline in function and safety with ADLs and ADL mobility with impaired strength, balance and endurance. PTA pt lived at an ALF and reports Ind with selfcare but that recently it is taking significantly increased time and effort to perform ADLs, facility staff manage IADLs, reports she has been having to use the transport chair for mobility over the past 3 weeks. Prior to that she was independent ambulating without AD but reports furniture walking at times. Pt currently requires assist with ADLs/selfcare sitting/bed level, declined getting OOB to walk to commode or sink due to fatigue. Pt would benefit from acute OT services to address impairments to maximize level of function and safety     If plan is discharge home, recommend the following:   A lot of help with bathing/dressing/bathroom;A little help with walking and/or transfers;Assist for transportation;Help with stairs or ramp for entrance     Functional Status Assessment   Patient has had a recent decline in their functional status and demonstrates the ability to make significant improvements in function in a reasonable and predictable amount of time.     Equipment Recommendations   Tub/shower seat;BSC/3in1     Recommendations for Other Services         Precautions/Restrictions   Precautions Precautions:  Fall Precaution/Restrictions Comments: Pt is legally blind; Watch BP Restrictions Weight Bearing Restrictions Per Provider Order: No     Mobility Bed Mobility Overal bed mobility: Needs Assistance Bed Mobility: Supine to Sit, Sit to Supine     Supine to sit: Min assist, Used rails Sit to supine: HOB elevated, Min assist   General bed mobility comments: Assist to elevate trunk and LE mgt,  increased time required    Transfers                   General transfer comment: pt declined. Per PT eval pt mod/min A with RW      Balance Overall balance assessment: Needs assistance Sitting-balance support: No upper extremity supported, Feet supported Sitting balance-Leahy Scale: Fair         Standing balance comment: declined standing/transfers                           ADL either performed or assessed with clinical judgement   ADL Overall ADL's : Needs assistance/impaired Eating/Feeding: Independent   Grooming: Wash/dry hands;Wash/dry face;Contact guard assist;Sitting   Upper Body Bathing: Contact guard assist;Sitting   Lower Body Bathing: Maximal assistance;Bed level   Upper Body Dressing : Contact guard assist;Sitting   Lower Body Dressing: Maximal assistance;Bed level     Toilet Transfer Details (indicate cue type and reason): pt declined Toileting- Clothing Manipulation and Hygiene: Total assistance;Bed level Toileting - Clothing Manipulation Details (indicate cue type and reason): pt declined transfers to commode       General ADL Comments: pt declined getting OOB to walk to commode or sink due to fatigue  Vision Baseline Vision/History: 2 Legally blind Patient Visual Report: No change from baseline       Perception         Praxis         Pertinent Vitals/Pain Pain Assessment Pain Assessment: No/denies pain     Extremity/Trunk Assessment Upper Extremity Assessment Upper Extremity Assessment: Generalized weakness   Lower  Extremity Assessment Lower Extremity Assessment: Defer to PT evaluation   Cervical / Trunk Assessment Cervical / Trunk Assessment: Other exceptions Cervical / Trunk Exceptions: body habitus   Communication Communication Communication: No apparent difficulties   Cognition Arousal: Alert Behavior During Therapy: WFL for tasks assessed/performed Cognition: No apparent impairments                               Following commands: Intact       Cueing  General Comments          Exercises     Shoulder Instructions      Home Living Family/patient expects to be discharged to:: Assisted living                             Home Equipment: Transport chair;Cane - single point;Rollator (4 wheels);Grab bars - toilet;Grab bars - tub/shower;Hand held shower head          Prior Functioning/Environment Prior Level of Function : Independent/Modified Independent             Mobility Comments: Pt reports she has been having to use the transport chair for mobility over the past 3 weeks. Prior to that she was independent ambulating without AD. Denies falls in the last 35mo. Pt hasn't been upstairs in a long time, uses an elevator. ADLs Comments: Pt reports Ind with selfcare but that it is taking significantly increased time and effort to perform ADLs. Facility staff manage IADLs.Pt reports she has been having to use the transport chair for mobility over the past 3 weeks. Prior to that she was independent ambulating without AD    OT Problem List: Decreased activity tolerance;Decreased knowledge of use of DME or AE   OT Treatment/Interventions: Self-care/ADL training;Therapeutic exercise;Patient/family education;Therapeutic activities;DME and/or AE instruction      OT Goals(Current goals can be found in the care plan section)   Acute Rehab OT Goals Patient Stated Goal: get better OT Goal Formulation: With patient Time For Goal Achievement:  05/07/24 Potential to Achieve Goals: Good ADL Goals Pt Will Perform Grooming: with contact guard assist;with supervision;standing Pt Will Perform Upper Body Bathing: with supervision;with set-up;sitting Pt Will Perform Lower Body Bathing: with mod assist;with min assist Pt Will Perform Upper Body Dressing: with supervision;with set-up;sitting Pt Will Perform Lower Body Dressing: with mod assist;with min assist Pt Will Transfer to Toilet: with min assist;with contact guard assist;ambulating Pt Will Perform Toileting - Clothing Manipulation and hygiene: with max assist;with mod assist;sitting/lateral leans;sit to/from stand   OT Frequency:  Min 2X/week    Co-evaluation              AM-PAC OT "6 Clicks" Daily Activity     Outcome Measure Help from another person eating meals?: None Help from another person taking care of personal grooming?: A Little Help from another person toileting, which includes using toliet, bedpan, or urinal?: A Lot Help from another person bathing (including washing, rinsing, drying)?: A Lot Help from another person to put on and taking off regular  upper body clothing?: A Little Help from another person to put on and taking off regular lower body clothing?: A Lot 6 Click Score: 16   End of Session Nurse Communication: Mobility status  Activity Tolerance: Patient limited by fatigue Patient left: in bed;with call bell/phone within reach;with bed alarm set  OT Visit Diagnosis: Other abnormalities of gait and mobility (R26.89);Muscle weakness (generalized) (M62.81)                Time: 7829-5621 OT Time Calculation (min): 16 min Charges:  OT General Charges $OT Visit: 1 Visit OT Evaluation $OT Eval Moderate Complexity: 1 Mod    Alfred Ann 04/23/2024, 1:00 PM

## 2024-04-23 NOTE — Progress Notes (Addendum)
 [facility] NEPHROLOGY PROGRESS NOTE  Assessment/ Plan: Pt is a 89 y.o. yo female with past medical history of HTN, HLD, CAD status post stent, A-fib, sick sinus with pacemaker, CKD 3B admitted with fluid overload and weakness.  She follows with Dr. Ansel Kingdom at [facility].  # Acute kidney injury on CKD 3b; b/l cr around 1.7.  AKI presumably due to reduced renal perfusion exacerbated by hypotension.  Exam consistent with peripheral edema.  The creatinine level is stable after holding diuretics however she will need to resume diuretics after stabilization of blood pressure.  Currently on midodrine. Strict ins and out and daily lab. No need for dialysis.  # Acute on chronic diastolic CHF with peripheral edema: Continue cardiac medication.  Recommend to hold ACE inhibitor, ARB.  Diuretics as above.  # A-fib: Cardizem  is stopped because of hypotension, cardiology is following.  # History of hypertension: Now managing low blood pressure.  Currently on midodrine.  Subjective: Seen and examined.  Denies nausea, vomiting, chest pain or shortness of breath.  Urine output is recorded around 450 cc.  No new event overnight. Objective Vital signs in last 24 hours: Vitals:   04/22/24 2004 04/23/24 0115 04/23/24 0438 04/23/24 0742  BP: (!) 108/53 (!) 123/54 (!) 106/41 (!) 109/59  Pulse: (!) 59 (!) 59 61 62  Resp: 18 17 17 20   Temp: 97.7 F (36.5 C) (!) 97.4 F (36.3 C) 97.7 F (36.5 C) 97.8 F (36.6 C)  TempSrc: Oral Oral Oral Oral  SpO2: 96% 97% 94% 96%  Weight:   118.8 kg   Height:       Weight change:   Intake/Output Summary (Last 24 hours) at 04/23/2024 0919 Last data filed at 04/22/2024 2000 Gross per 24 hour  Intake --  Output 450 ml  Net -450 ml       Labs: RENAL PANEL Recent Labs  Lab 04/20/24 1423 04/20/24 1430 04/21/24 1159 04/22/24 0250 04/23/24 0324  NA 137 137 136 138 136  K 3.5 3.5 3.4* 4.0 3.8  CL 97* 100 99 99 100  CO2 21*  --  23 23 22   GLUCOSE  226* 222* 200* 202* 176*  BUN 46* 40* 42* 42* 41*  CREATININE 3.16* 3.50* 2.95* 3.03* 2.85*  CALCIUM 10.2  --  10.0 10.3 9.9  ALBUMIN 2.9*  --   --   --   --     Liver Function Tests: Recent Labs  Lab 04/20/24 1423  AST 62*  ALT 25  ALKPHOS 70  BILITOT 1.0  PROT 8.5*  ALBUMIN 2.9*   No results for input(s): "LIPASE", "AMYLASE" in the last 168 hours. No results for input(s): "AMMONIA" in the last 168 hours. CBC: Recent Labs    10/10/23 0925 04/20/24 1423 04/20/24 1423 04/20/24 1430 04/21/24 0843 04/21/24 1159 04/23/24 0324  HGB 12.9 8.5*  --  9.2* 8.4*  --  8.3*  MCV  --  106.6*   < >  --  104.3*  --  107.9*  FERRITIN  --   --   --   --   --  85  --   TIBC  --   --   --   --   --  354  --   IRON  --   --   --   --   --  41  --    < > = values in this interval not displayed.    Cardiac Enzymes: No results for input(s): "CKTOTAL", "CKMB", "  CKMBINDEX", "TROPONINI" in the last 168 hours. CBG: Recent Labs  Lab 04/21/24 2235 04/22/24 0615 04/22/24 1223 04/22/24 1544 04/23/24 0554  GLUCAP 185* 171* 237* 204* 166*    Iron Studies:  Recent Labs    04/21/24 1159  IRON 41  TIBC 354  FERRITIN 85   Studies/Results: ECHOCARDIOGRAM COMPLETE Result Date: 04/21/2024    ECHOCARDIOGRAM REPORT   Patient Name:   Kirsten Jensen Date of Exam: 04/21/2024 Medical Rec #:  [id number]      Height:       67.0 in Accession #:    [id number]     Weight:       261.7 lb Date of Birth:  [birth year]       BSA:          2.267 m Patient Age:    89 years       BP:           104/55 mmHg Patient Gender: F              HR:           61 bpm. Exam Location:  Inpatient Procedure: 2D Echo, Cardiac Doppler and Color Doppler (Both Spectral and Color            Flow Doppler were utilized during procedure). Indications:    CHF- Acute Diastolic I50.31  History:        Patient has prior history of Echocardiogram examinations, most                 recent 10/31/2020. CHF, Pacemaker, Arrythmias:Atrial                  Fibrillation, Atrial Flutter, Tachycardia and Bradycardia,                 Signs/Symptoms:Chest Pain; Risk Factors:Hypertension, Diabetes                 and Dyslipidemia.  Sonographer:    Terrilee Few RCS Referring Phys: [id number] KUBER GHIMIRE IMPRESSIONS  1. Left ventricular ejection fraction, by estimation, is 60 to 65%. The left ventricle has normal function. The left ventricle has no regional wall motion abnormalities. There is mild left ventricular hypertrophy. Left ventricular diastolic parameters are consistent with Grade III diastolic dysfunction (restrictive). Elevated left atrial pressure.  2. Right ventricular systolic function is normal. The right ventricular size is normal. There is moderately elevated pulmonary artery systolic pressure. The estimated right ventricular systolic pressure is 46.1 mmHg.  3. Left atrial size was moderately dilated.  4. Right atrial size was mildly dilated.  5. The mitral valve is degenerative. Mild mitral valve regurgitation. No evidence of mitral stenosis. Moderate mitral annular calcification.  6. The tricuspid valve is abnormal. Tricuspid valve regurgitation is severe.  7. The aortic valve is tricuspid. Aortic valve regurgitation is not visualized. Aortic valve sclerosis is present, with no evidence of aortic valve stenosis.  8. The inferior vena cava is dilated in size with <50% respiratory variability, suggesting right atrial pressure of 15 mmHg. FINDINGS  Left Ventricle: Left ventricular ejection fraction, by estimation, is 60 to 65%. The left ventricle has normal function. The left ventricle has no regional wall motion abnormalities. The left ventricular internal cavity size was normal in size. There is  mild left ventricular hypertrophy. Left ventricular diastolic parameters are consistent with Grade III diastolic dysfunction (restrictive). Elevated left atrial pressure. Right Ventricle: The right ventricular size is normal. No increase in right ventricular  wall thickness.  Right ventricular systolic function is normal. There is moderately elevated pulmonary artery systolic pressure. The tricuspid regurgitant velocity is 2.79 m/s, and with an assumed right atrial pressure of 15 mmHg, the estimated right ventricular systolic pressure is 46.1 mmHg. Left Atrium: Left atrial size was moderately dilated. Right Atrium: Right atrial size was mildly dilated. Pericardium: Trivial pericardial effusion is present. Mitral Valve: The mitral valve is degenerative in appearance. Moderate mitral annular calcification. Mild mitral valve regurgitation. No evidence of mitral valve stenosis. Tricuspid Valve: The tricuspid valve is abnormal. Tricuspid valve regurgitation is severe. Aortic Valve: The aortic valve is tricuspid. Aortic valve regurgitation is not visualized. Aortic valve sclerosis is present, with no evidence of aortic valve stenosis. Aortic valve peak gradient measures 5.9 mmHg. Pulmonic Valve: The pulmonic valve was not well visualized. Pulmonic valve regurgitation is not visualized. Aorta: The aortic root and ascending aorta are structurally normal, with no evidence of dilitation. Venous: The inferior vena cava is dilated in size with less than 50% respiratory variability, suggesting right atrial pressure of 15 mmHg. IAS/Shunts: The interatrial septum was not well visualized.  LEFT VENTRICLE PLAX 2D LVIDd:         4.10 cm   Diastology LVIDs:         3.00 cm   LV e' medial:    4.26 cm/s LV PW:         0.80 cm   LV E/e' medial:  21.4 LV IVS:        0.90 cm   LV e' lateral:   6.12 cm/s LVOT diam:     2.10 cm   LV E/e' lateral: 14.9 LV SV:         59 LV SV Index:   26 LVOT Area:     3.46 cm  RIGHT VENTRICLE             IVC RV S prime:     11.40 cm/s  IVC diam: 2.20 cm TAPSE (M-mode): 1.3 cm LEFT ATRIUM             Index        RIGHT ATRIUM           Index LA diam:        4.00 cm 1.76 cm/m   RA Area:     24.10 cm LA Vol (A2C):   98.8 ml 43.56 ml/m  RA Volume:   79.90 ml   35.24 ml/m LA Vol (A4C):   84.0 ml 37.05 ml/m LA Biplane Vol: 98.3 ml 43.36 ml/m  AORTIC VALVE AV Area (Vmax): 2.61 cm AV Vmax:        121.00 cm/s AV Peak Grad:   5.9 mmHg LVOT Vmax:      91.20 cm/s LVOT Vmean:     58.700 cm/s LVOT VTI:       0.169 m  AORTA Ao Root diam: 3.30 cm Ao Asc diam:  3.10 cm MITRAL VALVE               TRICUSPID VALVE MV Area (PHT): 3.65 cm    TR Peak grad:   31.1 mmHg MV Decel Time: 208 msec    TR Vmax:        279.00 cm/s MV E velocity: 91.20 cm/s MV A velocity: 28.30 cm/s  SHUNTS MV E/A ratio:  3.22        Systemic VTI:  0.17 m  Systemic Diam: 2.10 cm Carson Clara MD Electronically signed by Carson Clara MD Signature Date/Time: 04/21/2024/10:28:45 PM    Final     Medications: Infusions:   Scheduled Medications:  amiodarone   200 mg Oral Daily   apixaban   2.5 mg Oral BID   ascorbic acid   500 mg Oral Daily   buPROPion   100 mg Oral Daily   calcium carbonate  1 tablet Oral BID WC   cholecalciferol   1,000 Units Oral Daily   cyanocobalamin   1,000 mcg Oral Daily   feeding supplement  237 mL Oral BID BM   ferrous sulfate   325 mg Oral Daily   fluticasone  1 spray Each Nare Daily   gemfibrozil   600 mg Oral BID   hydrocortisone  25 mg Rectal BID   insulin  aspart  0-9 Units Subcutaneous TID WC   levothyroxine   50 mcg Oral Q0600   metoprolol  succinate  25 mg Oral Daily   midodrine  10 mg Oral TID WC   pantoprazole   40 mg Oral BID AC   polyethylene glycol  17 g Oral Daily   potassium chloride   40 mEq Oral Daily   pravastatin   80 mg Oral q1800    have reviewed scheduled and prn medications.  Physical Exam: General:NAD, comfortable Heart:RRR, s1s2 nl Lungs:clear b/l, no crackle Abdomen:soft, Non-tender, non-distended Extremities:No edema Dialysis Access: Bilateral lower extremities pitting edema present  Aceson Labell Prasad Minyon Billiter 04/23/2024,9:19 AM  LOS: 3 days

## 2024-04-24 ENCOUNTER — Inpatient Hospital Stay (HOSPITAL_COMMUNITY)

## 2024-04-24 DIAGNOSIS — N170 Acute kidney failure with tubular necrosis: Secondary | ICD-10-CM | POA: Diagnosis not present

## 2024-04-24 DIAGNOSIS — N179 Acute kidney failure, unspecified: Secondary | ICD-10-CM | POA: Diagnosis not present

## 2024-04-24 DIAGNOSIS — I495 Sick sinus syndrome: Secondary | ICD-10-CM | POA: Diagnosis not present

## 2024-04-24 DIAGNOSIS — I5033 Acute on chronic diastolic (congestive) heart failure: Secondary | ICD-10-CM | POA: Diagnosis not present

## 2024-04-24 DIAGNOSIS — N1832 Chronic kidney disease, stage 3b: Secondary | ICD-10-CM | POA: Diagnosis not present

## 2024-04-24 LAB — CBC
HCT: 30.4 % — ABNORMAL LOW (ref 36.0–46.0)
Hemoglobin: 9.4 g/dL — ABNORMAL LOW (ref 12.0–15.0)
MCH: 32.8 pg (ref 26.0–34.0)
MCHC: 30.9 g/dL (ref 30.0–36.0)
MCV: 105.9 fL — ABNORMAL HIGH (ref 80.0–100.0)
Platelets: 268 10*3/uL (ref 150–400)
RBC: 2.87 MIL/uL — ABNORMAL LOW (ref 3.87–5.11)
RDW: 17.9 % — ABNORMAL HIGH (ref 11.5–15.5)
WBC: 8.4 10*3/uL (ref 4.0–10.5)
nRBC: 0.6 % — ABNORMAL HIGH (ref 0.0–0.2)

## 2024-04-24 LAB — BASIC METABOLIC PANEL WITH GFR
Anion gap: 17 — ABNORMAL HIGH (ref 5–15)
BUN: 45 mg/dL — ABNORMAL HIGH (ref 8–23)
CO2: 18 mmol/L — ABNORMAL LOW (ref 22–32)
Calcium: 10 mg/dL (ref 8.9–10.3)
Chloride: 101 mmol/L (ref 98–111)
Creatinine, Ser: 3.19 mg/dL — ABNORMAL HIGH (ref 0.44–1.00)
GFR, Estimated: 13 mL/min — ABNORMAL LOW (ref >60)
Glucose, Bld: 163 mg/dL — ABNORMAL HIGH (ref 70–99)
Potassium: 5.2 mmol/L — ABNORMAL HIGH (ref 3.5–5.1)
Sodium: 136 mmol/L (ref 135–145)

## 2024-04-24 LAB — GLUCOSE, CAPILLARY
Glucose-Capillary: 156 mg/dL — ABNORMAL HIGH (ref 70–99)
Glucose-Capillary: 136 mg/dL — ABNORMAL HIGH (ref 70–99)
Glucose-Capillary: 128 mg/dL — ABNORMAL HIGH (ref 70–99)
Glucose-Capillary: 130 mg/dL — ABNORMAL HIGH (ref 70–99)

## 2024-04-24 LAB — URINALYSIS, W/ REFLEX TO CULTURE (INFECTION SUSPECTED)
Bilirubin Urine: NEGATIVE
Glucose, UA: >=500 mg/dL — AB
Hgb urine dipstick: NEGATIVE
Ketones, ur: NEGATIVE mg/dL
Leukocytes,Ua: NEGATIVE
Nitrite: NEGATIVE
Protein, ur: 100 mg/dL — AB
Specific Gravity, Urine: 1.015 (ref 1.005–1.030)
pH: 5 (ref 5.0–8.0)

## 2024-04-24 LAB — BLOOD GAS, VENOUS
Acid-base deficit: 9 mmol/L — ABNORMAL HIGH (ref 0.0–2.0)
Bicarbonate: 16.9 mmol/L — ABNORMAL LOW (ref 20.0–28.0)
O2 Saturation: 61.2 %
Patient temperature: 36.4
pCO2, Ven: 35 mmHg — ABNORMAL LOW (ref 44–60)
pH, Ven: 7.29 (ref 7.25–7.43)
pO2, Ven: 37 mmHg (ref 32–45)

## 2024-04-24 LAB — ALBUMIN: Albumin: 2.8 g/dL — ABNORMAL LOW (ref 3.5–5.0)

## 2024-04-24 MED ORDER — ATROPINE SULFATE 1 MG/ML IV SOLN
1.0000 mg | Freq: Once | INTRAVENOUS | Status: DC
  Filled 2024-04-24: qty 1

## 2024-04-24 MED ORDER — CALCIUM GLUCONATE-NACL 1-0.675 GM/50ML-% IV SOLN
1.0000 g | Freq: Once | INTRAVENOUS | Status: AC
  Administered 2024-04-24: 1000 mg via INTRAVENOUS
  Filled 2024-04-24: qty 50

## 2024-04-24 MED ORDER — FUROSEMIDE 40 MG PO TABS
40.0000 mg | ORAL_TABLET | Freq: Every day | ORAL | Status: DC
  Administered 2024-04-24: 40 mg via ORAL
  Filled 2024-04-24: qty 1

## 2024-04-24 MED ORDER — GUAIFENESIN-DM 100-10 MG/5ML PO SYRP
5.0000 mL | ORAL_SOLUTION | ORAL | Status: DC | PRN
  Administered 2024-04-24 (×2): 5 mL via ORAL
  Filled 2024-04-24 (×2): qty 5

## 2024-04-24 MED ORDER — ATROPINE SULFATE 1 MG/ML IV SOLN: 10.0000 mg | Freq: Once | INTRAVENOUS | Status: DC

## 2024-04-24 MED ORDER — ATROPINE SULFATE 1 MG/10ML IJ SOSY
1.0000 mg | PREFILLED_SYRINGE | Freq: Once | INTRAMUSCULAR | Status: AC
Start: 2024-04-25 — End: 2024-04-24
  Administered 2024-04-24: 1 mg via INTRAVENOUS

## 2024-04-24 NOTE — Plan of Care (Signed)

## 2024-04-24 NOTE — Progress Notes (Addendum)
 Progress Note  Patient Name: Kirsten Jensen Date of Encounter: 04/24/2024  Primary Cardiologist: Richardo Chandler, MD   Subjective   Patient seen and examined at her bedside.   Inpatient Medications    Scheduled Meds:  amiodarone   200 mg Oral Daily   apixaban   2.5 mg Oral BID   ascorbic acid   500 mg Oral Daily   buPROPion   100 mg Oral Daily   calcium carbonate  1 tablet Oral BID WC   cholecalciferol   1,000 Units Oral Daily   cyanocobalamin   1,000 mcg Oral Daily   feeding supplement  237 mL Oral BID BM   ferrous sulfate   325 mg Oral Daily   fluticasone  1 spray Each Nare Daily   furosemide   40 mg Oral Daily   gemfibrozil   600 mg Oral BID   hydrocortisone  25 mg Rectal BID   insulin  aspart  0-9 Units Subcutaneous TID WC   levothyroxine   50 mcg Oral Q0600   metoprolol  succinate  25 mg Oral Daily   midodrine  10 mg Oral TID WC   pantoprazole   40 mg Oral BID AC   polyethylene glycol  17 g Oral Daily   pravastatin   80 mg Oral q1800   Continuous Infusions:  PRN Meds: acetaminophen  **OR** acetaminophen , guaiFENesin -dextromethorphan, ondansetron  (ZOFRAN ) IV, tiZANidine   Vital Signs    Vitals:   04/23/24 2138 04/24/24 0012 04/24/24 0401 04/24/24 0747  BP: 114/63 129/66 (!) 123/52 117/60  Pulse: (!) 59 60 (!) 57 60  Resp: 19 17 20 18   Temp:  97.8 F (36.6 C) 98.1 F (36.7 C) 97.9 F (36.6 C)  TempSrc:   Oral Oral  SpO2: 91% 91% 94% 92%  Weight:      Height:        Intake/Output Summary (Last 24 hours) at 04/24/2024 1018 Last data filed at 04/24/2024 0843 Gross per 24 hour  Intake 240 ml  Output 1000 ml  Net -760 ml   Filed Weights   04/20/24 2200 04/21/24 0527 04/23/24 0438  Weight: 118.7 kg 118.7 kg 118.8 kg    Telemetry    Paced rhythm  - Personally Reviewed  ECG     - Personally Reviewed  Physical Exam     General: Comfortable, legally blind Head: Atraumatic, normal size  Eyes: PEERLA, EOMI  Neck: Supple, normal JVD Cardiac: Normal S1, S2;  RRR; no murmurs, rubs, or gallops Lungs: Clear to auscultation bilaterally Abd: Soft, nontender, no hepatomegaly  Ext: warm, no edema Musculoskeletal: No deformities, BUE and BLE strength normal and equal Skin: Warm and dry, no rashes     Labs    Chemistry Recent Labs  Lab 04/20/24 1423 04/20/24 1430 04/22/24 0250 04/23/24 0324 04/24/24 0347  NA 137   < > 138 136 136  K 3.5   < > 4.0 3.8 5.2*  CL 97*   < > 99 100 101  CO2 21*   < > 23 22 18*  GLUCOSE 226*   < > 202* 176* 163*  BUN 46*   < > 42* 41* 45*  CREATININE 3.16*   < > 3.03* 2.85* 3.19*  CALCIUM 10.2   < > 10.3 9.9 10.0  PROT 8.5*  --   --   --   --   ALBUMIN 2.9*  --   --   --  2.8*  AST 62*  --   --   --   --   ALT 25  --   --   --   --  ALKPHOS 70  --   --   --   --   BILITOT 1.0  --   --   --   --   GFRNONAA 14*   < > 14* 15* 13*  ANIONGAP 19*   < > 16* 14 17*   < > = values in this interval not displayed.     Hematology Recent Labs  Lab 04/21/24 0843 04/23/24 0324 04/24/24 0347  WBC 5.5 6.2 8.4  RBC 2.54* 2.54* 2.87*  HGB 8.4* 8.3* 9.4*  HCT 26.5* 27.4* 30.4*  MCV 104.3* 107.9* 105.9*  MCH 33.1 32.7 32.8  MCHC 31.7 30.3 30.9  RDW 17.9* 17.9* 17.9*  PLT 228 231 268    Cardiac EnzymesNo results for input(s): "TROPONINI" in the last 168 hours. No results for input(s): "TROPIPOC" in the last 168 hours.   BNP Recent Labs  Lab 04/20/24 1423  BNP 2,719.9*     DDimer No results for input(s): "DDIMER" in the last 168 hours.   Radiology    No results found.   Cardiac Studies  Echo    Patient Profile     89 y.o. female with PAF, Hypertension admitted for PAF and diastolic heart failure  Assessment & Plan    Paroxysmal atrial fibrillation  - in sinus rhythm/paced rhythm, on amiodarone  and metoprolol  . Eliquis  restarted hemoglobin remains stable - we will monitor hgb closely. Her CHADs2VAsc score is 4.   Cardizem  stopped  to allow for blood pressure. Will continue to monitor closely.  Heart rate is still within normal  Hypertension - blood pressure improved on the midodrine.  Heart failure with preserved ejection fraction-   recent Echo EF is normal but noted pulmonary hypertension. Ok to restart her Lasix  today : Lasix  40 mg   Midodrine increased to 10 mg TID.  CAD - no angina. Continue with statin. No plans for any ischemic evaluation.  AKI on CKD - Her cr is 3.19 today , Nephro is on board.    PPM - paced this morning    For questions or updates, please contact [facility] Please consult [email or web address] for contact info under Cardiology/STEMI.      Signed, Eliyana Pagliaro, DO  04/24/2024, 10:18 AM

## 2024-04-24 NOTE — Progress Notes (Signed)
 PROGRESS NOTE    Kirsten Jensen  NFA:[id number] DOB: [birth year] DOA: 04/20/2024 PCP: Tena Feeling, MD    Brief Narrative:  89 year old with history of extensive cardiac issues including paroxysmal A-fib, sick sinus syndrome status post pacemaker chronically on Eliquis , type 2 diabetes on metformin , coronary artery disease status post stenting, morbid obesity, hypertension, hyperlipidemia and CKD stage IIIb, bilateral blindness due to macular degeneration presented to the emergency room with about 1 month of gradually worsening leg swelling and gradual decrease in mobility.  In the emergency room on room air.  Blood pressure stable.  Hemoglobin 9 with recent hemoglobin of 11.  Creatinine 3.5 with recent creatinine of 1.6.  Chest x-ray with chronic cardiomegaly and prominent bronchovascular markings.  BNP was 2719.  Admitted with fluid overload, AKI and anemia. Remains in the hospital with fluctuating renal functions,  Subjective: Patient seen and examined.  Today she has dry cough and occasional vomiting associated with cough.  Otherwise at rest she denies any complaints.  No other overnight events.  Afebrile.  Urine is reportedly cloudy but patient denies any burning urination.  Urine output is not measured appropriately.  Creatinine is 3.19 today.  Potassium 5.2 on supplements. A chest x-ray was done and reviewed, shows similar appearing interstitial prominence.  Negative for pleural effusion.  Assessment & Plan:   Acute on chronic diastolic heart failure: Fluid overload, multifactorial. Initially treated with IV Lasix  and then discontinued to improve hemodynamics.   home dose of Toprol  XL 25 mg daily. Holding ramipril  due to AKI. Repeat echocardiogram with normal ejection fraction, grade 3 diastolic dysfunction. 5/13, starting on low-dose Lasix  today and continue to monitor.   AKI on CKD stage IIIb: Probably multifactorial and prerenal. Recent baseline creatinine 1.6.  Creatinine  3.5.-3-2.85-3.19 today. Strict intake output monitoring.  Potassium is adequate. Holding Jardiance, ramipril  and metformin .  Nephrology following.  Started on midodrine 10 mg 3 times daily with improvement of blood pressures.   Acute on chronic iron deficiency anemia: Patient with known hemorrhoids and intermittently bleeding. EGD, colonoscopy reviewed from 2021.  She has internal and external hemorrhoids. Started patient on steroid suppositories.  She is currently not requiring. Back on Eliquis .  No evidence of bleeding.   Chronic A-fib, tachybradycardia syndrome status post pacemaker: Currently stable.  Rate is controlled.  Metoprolol  resumed.  Cardizem  on hold to improve blood pressures.  Tolerating resumption of Eliquis .   Type 2 diabetes: Well-controlled.  On metformin  at home.  Remains on SSI.   Hypertension: As above.  At risk of hypotension.  Continuing Toprol  XL with holding parameters.  Discontinued ramipril  and Cardizem .   Hypothyroidism: On Synthroid .  Abnormal urine, will check UA and cultures.  Patient with multiple medical issues and now with worsening clinical status, she will benefit with palliative care discussions.  On my interview, patient wanted to be full code.   DVT prophylaxis: apixaban  (ELIQUIS ) tablet 2.5 mg Start: 04/22/24 1000 apixaban  (ELIQUIS ) tablet 2.5 mg   Code Status: Full code, consulting palliative care team. Family Communication: None at the bedside.  Patient's daughter-in-law was called and updated Disposition Plan: Status is: Inpatient Remains inpatient appropriate because: Medication management, renal function recovery   Consultants:  Cardiology Nephrology Palliative care  Procedures:  None   Antimicrobials:  None     Objective: Vitals:   04/23/24 2138 04/24/24 0012 04/24/24 0401 04/24/24 0747  BP: 114/63 129/66 (!) 123/52 117/60  Pulse: (!) 59 60 (!) 57 60  Resp: 19 17 20  18  Temp:  97.8 F (36.6 C) 98.1 F (36.7 C)  97.9 F (36.6 C)  TempSrc:   Oral Oral  SpO2: 91% 91% 94% 92%  Weight:      Height:        Intake/Output Summary (Last 24 hours) at 04/24/2024 1048 Last data filed at 04/24/2024 0843 Gross per 24 hour  Intake 240 ml  Output 1000 ml  Net -760 ml   Filed Weights   04/20/24 2200 04/21/24 0527 04/23/24 0438  Weight: 118.7 kg 118.7 kg 118.8 kg    Examination:  General exam: Nauseated and sick looking today.  She was otherwise comfortable in between the episodes. Respiratory system: Clear to auscultation. Respiratory effort normal.  Some conducted upper airway sounds. Cardiovascular system: S1 & S2 heard, RRR.  3+ bilateral pedal edema. Gastrointestinal system: Abdomen is nondistended, soft and nontender. No organomegaly or masses felt. Normal bowel sounds heard. Central nervous system: Alert and oriented. No focal neurological deficits.  Gross generalized weakness. Extremities: Symmetric 5 x 5 power.  Generalized weakness.    Data Reviewed: I have personally reviewed following labs and imaging studies  CBC: Recent Labs  Lab 04/20/24 1423 04/20/24 1430 04/21/24 0843 04/23/24 0324 04/24/24 0347  WBC 5.6  --  5.5 6.2 8.4  NEUTROABS 4.2  --   --   --   --   HGB 8.5* 9.2* 8.4* 8.3* 9.4*  HCT 27.6* 27.0* 26.5* 27.4* 30.4*  MCV 106.6*  --  104.3* 107.9* 105.9*  PLT 222  --  228 231 268   Basic Metabolic Panel: Recent Labs  Lab 04/20/24 1423 04/20/24 1430 04/21/24 1159 04/22/24 0250 04/23/24 0324 04/24/24 0347  NA 137 137 136 138 136 136  K 3.5 3.5 3.4* 4.0 3.8 5.2*  CL 97* 100 99 99 100 101  CO2 21*  --  23 23 22  18*  GLUCOSE 226* 222* 200* 202* 176* 163*  BUN 46* 40* 42* 42* 41* 45*  CREATININE 3.16* 3.50* 2.95* 3.03* 2.85* 3.19*  CALCIUM 10.2  --  10.0 10.3 9.9 10.0   GFR: Estimated Creatinine Clearance: 15.9 mL/min (A) (by C-G formula based on SCr of 3.19 mg/dL (H)). Liver Function Tests: Recent Labs  Lab 04/20/24 1423 04/24/24 0347  AST 62*  --   ALT 25   --   ALKPHOS 70  --   BILITOT 1.0  --   PROT 8.5*  --   ALBUMIN 2.9* 2.8*   No results for input(s): "LIPASE", "AMYLASE" in the last 168 hours. No results for input(s): "AMMONIA" in the last 168 hours. Coagulation Profile: No results for input(s): "INR", "PROTIME" in the last 168 hours. Cardiac Enzymes: No results for input(s): "CKTOTAL", "CKMB", "CKMBINDEX", "TROPONINI" in the last 168 hours. BNP (last 3 results) No results for input(s): "PROBNP" in the last 8760 hours. HbA1C: No results for input(s): "HGBA1C" in the last 72 hours. CBG: Recent Labs  Lab 04/22/24 1544 04/23/24 0554 04/23/24 1159 04/23/24 1612 04/24/24 0600  GLUCAP 204* 166* 260* 242* 156*   Lipid Profile: No results for input(s): "CHOL", "HDL", "LDLCALC", "TRIG", "CHOLHDL", "LDLDIRECT" in the last 72 hours. Thyroid  Function Tests: No results for input(s): "TSH", "T4TOTAL", "FREET4", "T3FREE", "THYROIDAB" in the last 72 hours. Anemia Panel: Recent Labs    04/21/24 1159  FERRITIN 85  TIBC 354  IRON 41   Sepsis Labs: No results for input(s): "PROCALCITON", "LATICACIDVEN" in the last 168 hours.  No results found for this or any previous visit (from the past 240  hours).       Radiology Studies: No results found.       Scheduled Meds:  amiodarone   200 mg Oral Daily   apixaban   2.5 mg Oral BID   ascorbic acid   500 mg Oral Daily   buPROPion   100 mg Oral Daily   calcium carbonate  1 tablet Oral BID WC   cholecalciferol   1,000 Units Oral Daily   cyanocobalamin   1,000 mcg Oral Daily   feeding supplement  237 mL Oral BID BM   ferrous sulfate   325 mg Oral Daily   fluticasone  1 spray Each Nare Daily   furosemide   40 mg Oral Daily   gemfibrozil   600 mg Oral BID   hydrocortisone  25 mg Rectal BID   insulin  aspart  0-9 Units Subcutaneous TID WC   levothyroxine   50 mcg Oral Q0600   metoprolol  succinate  25 mg Oral Daily   midodrine  10 mg Oral TID WC   pantoprazole   40 mg Oral BID AC    polyethylene glycol  17 g Oral Daily   pravastatin   80 mg Oral q1800   Continuous Infusions:   LOS: 4 days       Kirsten Swalley, MD [facility]

## 2024-04-24 NOTE — Plan of Care (Signed)
   Problem: Education: Goal: Knowledge of General Education information will improve Description Including pain rating scale, medication(s)/side effects and non-pharmacologic comfort measures Outcome: Progressing

## 2024-04-24 NOTE — Progress Notes (Signed)
 Chaplain responded to Code Blue.  Pt quickly revived.  No family present.  Pt still receiving care.  Chaplain did not offer pastoral presence at this time.   Chaplain on standby if needed.  Alexis Anger Chaplain

## 2024-04-24 NOTE — Progress Notes (Signed)
 [facility] NEPHROLOGY PROGRESS NOTE  Assessment/ Plan: Pt is a 89 y.o. yo female with past medical history of HTN, HLD, CAD status post stent, A-fib, sick sinus with pacemaker, CKD 3B admitted with fluid overload and weakness.  She follows with Dr. Ansel Kingdom at [facility].  # Acute kidney injury on CKD 3b; b/l cr around 1.7.  AKI presumably due to reduced renal perfusion exacerbated by hypotension.  Exam consistent with peripheral edema.  Resume furosemide  40 mg daily Discontinue Kcl Continue midodrine with improvement of blood pressure. Strict ins and out and daily lab. No need for dialysis.  # Acute on chronic diastolic CHF with peripheral edema: Continue cardiac medication.  Recommend to hold ACE inhibitor, ARB.  Diuretics as above.  # A-fib: Cardizem  is stopped because of hypotension, cardiology is following.  # History of hypertension: Now managing low blood pressure.  Currently on midodrine.  # Metabolic acidosis: Monitor CO2 level with diuretics.  Try to minimize sodium load.  # Pyuria: Urine looks cloudy, sending urinalysis and started antibiotics per primary team.  Subjective: Seen and examined.  Urine output is recorded around 900 cc.  Denies nausea, vomiting, chest pain or shortness of breath.  Objective Vital signs in last 24 hours: Vitals:   04/23/24 2138 04/24/24 0012 04/24/24 0401 04/24/24 0747  BP: 114/63 129/66 (!) 123/52 117/60  Pulse: (!) 59 60 (!) 57 60  Resp: 19 17 20 18   Temp:  97.8 F (36.6 C) 98.1 F (36.7 C) 97.9 F (36.6 C)  TempSrc:   Oral Oral  SpO2: 91% 91% 94% 92%  Weight:      Height:       Weight change:   Intake/Output Summary (Last 24 hours) at 04/24/2024 0943 Last data filed at 04/24/2024 0843 Gross per 24 hour  Intake 240 ml  Output 1000 ml  Net -760 ml       Labs: RENAL PANEL Recent Labs  Lab 04/20/24 1423 04/20/24 1430 04/21/24 1159 04/22/24 0250 04/23/24 0324 04/24/24 0347  NA 137 137 136 138 136 136  K  3.5 3.5 3.4* 4.0 3.8 5.2*  CL 97* 100 99 99 100 101  CO2 21*  --  23 23 22  18*  GLUCOSE 226* 222* 200* 202* 176* 163*  BUN 46* 40* 42* 42* 41* 45*  CREATININE 3.16* 3.50* 2.95* 3.03* 2.85* 3.19*  CALCIUM 10.2  --  10.0 10.3 9.9 10.0  ALBUMIN 2.9*  --   --   --   --  2.8*    Liver Function Tests: Recent Labs  Lab 04/20/24 1423 04/24/24 0347  AST 62*  --   ALT 25  --   ALKPHOS 70  --   BILITOT 1.0  --   PROT 8.5*  --   ALBUMIN 2.9* 2.8*   No results for input(s): "LIPASE", "AMYLASE" in the last 168 hours. No results for input(s): "AMMONIA" in the last 168 hours. CBC: Recent Labs    04/20/24 1423 04/20/24 1430 04/21/24 0843 04/21/24 1159 04/23/24 0324 04/24/24 0347  HGB 8.5* 9.2* 8.4*  --  8.3* 9.4*  MCV 106.6*  --  104.3*  --  107.9* 105.9*  FERRITIN  --   --   --  85  --   --   TIBC  --   --   --  354  --   --   IRON  --   --   --  41  --   --     Cardiac Enzymes:  No results for input(s): "CKTOTAL", "CKMB", "CKMBINDEX", "TROPONINI" in the last 168 hours. CBG: Recent Labs  Lab 04/22/24 1544 04/23/24 0554 04/23/24 1159 04/23/24 1612 04/24/24 0600  GLUCAP 204* 166* 260* 242* 156*    Iron Studies:  Recent Labs    04/21/24 1159  IRON 41  TIBC 354  FERRITIN 85   Studies/Results: No results found.   Medications: Infusions:   Scheduled Medications:  amiodarone   200 mg Oral Daily   apixaban   2.5 mg Oral BID   ascorbic acid   500 mg Oral Daily   buPROPion   100 mg Oral Daily   calcium carbonate  1 tablet Oral BID WC   cholecalciferol   1,000 Units Oral Daily   cyanocobalamin   1,000 mcg Oral Daily   feeding supplement  237 mL Oral BID BM   ferrous sulfate   325 mg Oral Daily   fluticasone  1 spray Each Nare Daily   gemfibrozil   600 mg Oral BID   hydrocortisone  25 mg Rectal BID   insulin  aspart  0-9 Units Subcutaneous TID WC   levothyroxine   50 mcg Oral Q0600   metoprolol  succinate  25 mg Oral Daily   midodrine  10 mg Oral TID WC   pantoprazole   40  mg Oral BID AC   polyethylene glycol  17 g Oral Daily   pravastatin   80 mg Oral q1800    have reviewed scheduled and prn medications.  Physical Exam: General:NAD, comfortable Heart:RRR, s1s2 nl Lungs:clear b/l, no crackle Abdomen:soft, Non-tender, non-distended Extremities:No edema Dialysis Access: Bilateral lower extremities pitting edema present  Kirsten Jensen 04/24/2024,9:43 AM  LOS: 4 days

## 2024-04-24 NOTE — Significant Event (Signed)
 Rapid Response Event Note   Reason for Call :  Unresponsiveness and bradycardia.  Per RN, pt became unresponsive after her bath.   Initial Focused Assessment:  MD at bedside examining pt. HR-20s. Dual chamber pacing spikes seen on monitor without capture. Pt breathing agonally. MD did one chest compression d/t inability to palpate pulse. After one chest compression, pt began to moan. On exam, pt able to follow commands with weak pulse. Pt given 1amp atropine IV and bagged on 100% via amubag. Zoll pads placed.  HR increased to 45, SBP-110s. At this time, pt became more responsive and was placed on NRB mask with SpO2-100%. Pt's pacer began to capture again at a rate of 60. 1gram Ca gluc given. EKG done showing dual chamber pacing.   HR-60, BP-115/49, RR-18, SpO2-100% on NRB.   Code Blue initiated during this episode for extra assistance. Once verified that pulse was present, Code Blue was cancelled.   Cards to bedside to verify pacer functioning.  Interventions:  Dr. [facility] Lavender to bedside Code Blue initiated by 3E staff>cancelled 1amp atropine IV BVM>NRB 1gram Ca C EKG Cardiology to bedside VBG BMP LA Mg Trop TSH Plan of Care:  Pt is back to baseline per RN. VSS. Keep Atropine at bedside and zoll pads on pt. Wean oxygen as SpO2 allows. Await lab results. Continue to monitor pt closely. Please call RRT if further assistance needed.   Event Summary:   MD Notified: Dr. [facility] Lavender, Cards MD Jeryl Moris to bedside.  Call Time:2239 Arrival Time:2240 End Time:2320  0116 Update:  Called back to room d/t bradycardia-30s, SBP-90s, and SOB requiring NRB after pt had a coughing spell. Pacer again not capturing. Pt was given atropine 1 amp with HR increasing to 60s and BP to 115/58. MS at baseline and SOB resolved. Pt being treated for hyperkalemia with D50, 5 units insulin , and albuterol  and being given lasix  for edema on PCXR.  Dr. Michell Ahumada and Dr. Jeryl Moris both notified.   0255 Update:  Called back to bedside d/t HR-30s, SBP-70, SOB. Again, pacer was not capturing. Pt was placed on NRB, breathing is labored, Sp02-100%. MS decreased. Dr. Michell Ahumada notified. PCCM consulted. Dr. Mason Sole to bedside. ABG-7.31/27/98/13.8. Pt to CT scan. Prior to CT scan, pt bradycardia-40s(pacer not capturing). This continued t/o CT scan. While in CT, SBP dropped to 70s. Pt was given 1amp atropine at 0443 with little change in HR. Dr. Mason Sole notified. Transcutaneous pacing initiated at 80mA, 70PPM and levo gtt started.  Pt txed to 29M with 3E RN x2 and RR RN. Pt intubated on arrival.   Dorrine Gaudy, RN

## 2024-04-24 NOTE — Progress Notes (Signed)
   04/24/24 1202  Spiritual Encounters  Type of Visit Initial  Care provided to: Patient  Reason for visit Advance directives   Chaplain responding to request for advance care Directive education and paperwork.  When chaplain arrived in the room the Pt was on the phone and asked that I leave the paperwork for her to look over with her family.  They would call for chaplain if they had questions or wanted to move forward.  No further spiritual care needs at this time.

## 2024-04-24 NOTE — Significant Event (Signed)
 Patient's nurse notified me that patient became unresponsive after the bath.  On exam at bedside patient was minimally responsive.  Patient was bradycardic with heart rate in the 27/min with systolic blood pressure in the 60s.  Pulse is felt but weak and bradycardic.  At one point I did give one chest compression following which patient became more alert.  One dose of atropine IV one mg was given following which heart rate improved blood pressure also improved patient became more alert awake.  I reviewed patient's labs notes and medications.  Repeat labs including metabolic panel CBC troponin TSH have been ordered.  Cardiology has been notified.  Code team is at the bedside.  Patient's son Mr. Tousey updated.  Melford Spotted.

## 2024-04-24 NOTE — Progress Notes (Signed)
 PT Cancellation Note  Patient Details Name: Kirsten Jensen MRN: [id number] DOB: 1932-08-13   Cancelled Treatment:    Reason Eval/Treat Not Completed: Other (comment). Despite extensive education on importance of OOB mobility to minimize deconditioning to minimize chance of needing inpatient rehab program prior to returning to ALF pt adamantly refused therapy stating, "I'm just too tired. I haven't slept."  Acute PT to return as able to progress mobility.  Shannan Slinker, PT, DPT Acute Rehabilitation Services Secure chat preferred Office #: [phone]    Jenna Moan 04/24/2024, 12:43 PM

## 2024-04-24 NOTE — Code Documentation (Signed)
  Patient Name: Kirsten Jensen   MRN: [id number]   Date of Birth/ Sex: 1932/05/15 , female      Admission Date: 04/20/2024  Attending Provider: Vada Garibaldi, MD  Primary Diagnosis: CHF exacerbation [facility])   Indication: Pt was in her usual state of health until this PM, when she was noted to be unresponsive with a pulse.  Code blue was subsequently called. At the time of arrival on scene, ACLS protocol was underway. Rhythm on arrival was bradycardic at 40-60 with AV pacing spikes and inconsistent capture.  Reviewed telemetry prior which showed even less consistent capture with heart rate in the 20s.  After receiving atropine her pacing became more consistent with a heart rate of 60 and patient was at baseline.  She did not experience any chest pain but did have some nausea and abdominal pain during the event.   Technical Description:  - CPR performance duration:  1 compression  - Was defibrillation or cardioversion used? No   - Was external pacer placed? Yes  - Was patient intubated pre/post CPR? No   Medications Administered: Y = Yes; Blank = No Amiodarone     Atropine  Y  Calcium  Y  Epinephrine    Lidocaine     Magnesium     Norepinephrine    Phenylephrine    Sodium bicarbonate    Vasopressin     Post CPR evaluation:  - Final Status - Was patient successfully resuscitated ? Yes - What is current rhythm? AV paced, rate 60 - What is current hemodynamic status? stable  Miscellaneous Information:  - Labs sent, including: BMP, Mg, VBG, troponin, lactic, EKG, CXR  - Primary team notified?  Yes  - Family Notified? unknown  - Additional notes/ transfer status: Pt to remain at current status. Cardiology consulted and has no further recommendations with pacing wires in correct position on CXR.      Cleven Dallas, DO  04/24/2024, 11:43 PM

## 2024-04-24 NOTE — Progress Notes (Signed)
 OT Cancellation Note  Patient Details Name: Kirsten Jensen MRN: [id number] DOB: [birth year]   Cancelled Treatment:     Pt encouraged to participate with OT, but pt refused due to poor sleep, constipation, and cough. OT educated that movement OOB could help some of these issues and pt continued to refuse. Pt asked for therapy to maybe try in PM and pt's assigned physical therapist was notified.   Asher Blade 04/24/2024, 11:26 AM

## 2024-04-25 ENCOUNTER — Inpatient Hospital Stay (HOSPITAL_COMMUNITY)

## 2024-04-25 DIAGNOSIS — R4182 Altered mental status, unspecified: Secondary | ICD-10-CM

## 2024-04-25 DIAGNOSIS — J9601 Acute respiratory failure with hypoxia: Secondary | ICD-10-CM

## 2024-04-25 DIAGNOSIS — Z515 Encounter for palliative care: Secondary | ICD-10-CM

## 2024-04-25 DIAGNOSIS — I480 Paroxysmal atrial fibrillation: Secondary | ICD-10-CM

## 2024-04-25 DIAGNOSIS — E8721 Acute metabolic acidosis: Secondary | ICD-10-CM

## 2024-04-25 DIAGNOSIS — R001 Bradycardia, unspecified: Secondary | ICD-10-CM

## 2024-04-25 DIAGNOSIS — E872 Acidosis, unspecified: Secondary | ICD-10-CM

## 2024-04-25 LAB — BLOOD GAS, ARTERIAL
Acid-base deficit: 11.1 mmol/L — ABNORMAL HIGH (ref 0.0–2.0)
Bicarbonate: 13.8 mmol/L — ABNORMAL LOW (ref 20.0–28.0)
Drawn by: 311011
O2 Saturation: 98.3 %
Patient temperature: 36.4
pCO2 arterial: 27 mmHg — ABNORMAL LOW (ref 32–48)
pH, Arterial: 7.31 — ABNORMAL LOW (ref 7.35–7.45)
pO2, Arterial: 98 mmHg (ref 83–108)

## 2024-04-25 LAB — URINALYSIS, ROUTINE W REFLEX MICROSCOPIC
Glucose, UA: NEGATIVE mg/dL
Ketones, ur: NEGATIVE mg/dL
Leukocytes,Ua: NEGATIVE
Nitrite: POSITIVE — AB
Protein, ur: <30 mg/dL — AB
Specific Gravity, Urine: >1.03 — ABNORMAL HIGH (ref 1.005–1.030)
pH: 5.5 (ref 5.0–8.0)

## 2024-04-25 LAB — CBC
HCT: 31.2 % — ABNORMAL LOW (ref 36.0–46.0)
Hemoglobin: 9.4 g/dL — ABNORMAL LOW (ref 12.0–15.0)
MCH: 33.1 pg (ref 26.0–34.0)
MCHC: 30.1 g/dL (ref 30.0–36.0)
MCV: 109.9 fL — ABNORMAL HIGH (ref 80.0–100.0)
Platelets: 284 10*3/uL (ref 150–400)
RBC: 2.84 MIL/uL — ABNORMAL LOW (ref 3.87–5.11)
RDW: 18.1 % — ABNORMAL HIGH (ref 11.5–15.5)
WBC: 13.3 10*3/uL — ABNORMAL HIGH (ref 4.0–10.5)
nRBC: 1.3 % — ABNORMAL HIGH (ref 0.0–0.2)

## 2024-04-25 LAB — URINALYSIS, MICROSCOPIC (REFLEX)

## 2024-04-25 LAB — LACTIC ACID, PLASMA
Lactic Acid, Venous: 5.9 mmol/L (ref 0.5–1.9)
Lactic Acid, Venous: >9 mmol/L (ref 0.5–1.9)

## 2024-04-25 LAB — MAGNESIUM: Magnesium: 2.7 mg/dL — ABNORMAL HIGH (ref 1.7–2.4)

## 2024-04-25 LAB — GLUCOSE, CAPILLARY
Glucose-Capillary: 123 mg/dL — ABNORMAL HIGH (ref 70–99)
Glucose-Capillary: 71 mg/dL (ref 70–99)

## 2024-04-25 LAB — BASIC METABOLIC PANEL WITH GFR
Anion gap: 26 — ABNORMAL HIGH (ref 5–15)
BUN: 56 mg/dL — ABNORMAL HIGH (ref 8–23)
CO2: 14 mmol/L — ABNORMAL LOW (ref 22–32)
Calcium: 9.5 mg/dL (ref 8.9–10.3)
Chloride: 99 mmol/L (ref 98–111)
Creatinine, Ser: 4.56 mg/dL — ABNORMAL HIGH (ref 0.44–1.00)
GFR, Estimated: 9 mL/min — ABNORMAL LOW (ref >60)
Glucose, Bld: 65 mg/dL — ABNORMAL LOW (ref 70–99)
Potassium: 6.9 mmol/L (ref 3.5–5.1)
Sodium: 139 mmol/L (ref 135–145)

## 2024-04-25 LAB — MRSA NEXT GEN BY PCR, NASAL: MRSA by PCR Next Gen: NOT DETECTED

## 2024-04-25 LAB — TROPONIN I (HIGH SENSITIVITY)
Troponin I (High Sensitivity): 17 ng/L (ref <18)
Troponin I (High Sensitivity): 165 ng/L (ref <18)

## 2024-04-25 LAB — TSH: TSH: 3.353 u[IU]/mL (ref 0.350–4.500)

## 2024-04-25 MED ORDER — ATROPINE SULFATE 1 MG/10ML IJ SOSY
1.0000 mg | PREFILLED_SYRINGE | Freq: Once | INTRAMUSCULAR | Status: AC
  Administered 2024-04-25: 1 mg via INTRAVENOUS

## 2024-04-25 MED ORDER — ATROPINE SULFATE 1 MG/10ML IJ SOSY
1.0000 mg | PREFILLED_SYRINGE | Freq: Once | INTRAMUSCULAR | Status: AC
Start: 2024-04-25 — End: 2024-04-25
  Administered 2024-04-25: 1 mg via INTRAVENOUS

## 2024-04-25 MED ORDER — EPINEPHRINE HCL 5 MG/250ML IV SOLN IN NS
INTRAVENOUS | Status: AC
Start: 2024-04-25 — End: 2024-04-25
  Administered 2024-04-25: 5 mg
  Filled 2024-04-25: qty 250

## 2024-04-25 MED ORDER — SODIUM BICARBONATE 8.4 % IV SOLN
INTRAVENOUS | Status: AC
  Filled 2024-04-25: qty 50

## 2024-04-25 MED ORDER — SODIUM BICARBONATE 8.4 % IV SOLN
INTRAVENOUS | Status: AC
  Administered 2024-04-25: 50 meq
  Filled 2024-04-25: qty 50

## 2024-04-25 MED ORDER — MIDAZOLAM HCL 2 MG/2ML IJ SOLN
INTRAMUSCULAR | Status: AC
  Administered 2024-04-25: 2 mg
  Filled 2024-04-25: qty 2

## 2024-04-25 MED ORDER — FENTANYL CITRATE PF 50 MCG/ML IJ SOSY
PREFILLED_SYRINGE | INTRAMUSCULAR | Status: AC
  Filled 2024-04-25: qty 2

## 2024-04-25 MED ORDER — DOCUSATE SODIUM 100 MG PO CAPS: 100.0000 mg | ORAL_CAPSULE | Freq: Two times a day (BID) | ORAL | Status: DC | PRN

## 2024-04-25 MED ORDER — POLYETHYLENE GLYCOL 3350 17 G PO PACK: 17.0000 g | PACK | Freq: Every day | ORAL | Status: DC | PRN

## 2024-04-25 MED ORDER — DOPAMINE-DEXTROSE 3.2-5 MG/ML-% IV SOLN
INTRAVENOUS | Status: AC
  Filled 2024-04-25: qty 250

## 2024-04-25 MED ORDER — CHLORHEXIDINE GLUCONATE CLOTH 2 % EX PADS
6.0000 | MEDICATED_PAD | CUTANEOUS | Status: DC
Start: 2024-04-25 — End: 2024-04-25
  Administered 2024-04-25: 6 via TOPICAL

## 2024-04-25 MED ORDER — FUROSEMIDE 10 MG/ML IJ SOLN
40.0000 mg | Freq: Once | INTRAMUSCULAR | Status: AC
  Administered 2024-04-25: 40 mg via INTRAVENOUS
  Filled 2024-04-25: qty 4

## 2024-04-25 MED ORDER — DEXTROSE 50 % IV SOLN
1.0000 | Freq: Once | INTRAVENOUS | Status: AC
  Administered 2024-04-25: 50 mL via INTRAVENOUS
  Filled 2024-04-25: qty 50

## 2024-04-25 MED ORDER — NOREPINEPHRINE 4 MG/250ML-% IV SOLN
1.0000 ug/min | INTRAVENOUS | Status: DC
  Administered 2024-04-25: 5 ug/min via INTRAVENOUS
  Filled 2024-04-25 (×2): qty 250

## 2024-04-25 MED ORDER — ORAL CARE MOUTH RINSE: 15.0000 mL | OROMUCOSAL | Status: DC | PRN

## 2024-04-25 MED ORDER — ORAL CARE MOUTH RINSE: 15.0000 mL | OROMUCOSAL | Status: DC

## 2024-04-25 MED ORDER — DOPAMINE-DEXTROSE 3.2-5 MG/ML-% IV SOLN
0.0000 ug/kg/min | INTRAVENOUS | Status: DC
  Administered 2024-04-25: 5 ug/kg/min via INTRAVENOUS
  Administered 2024-04-25: 15 ug/kg/min via INTRAVENOUS

## 2024-04-25 MED ORDER — ALBUTEROL SULFATE (2.5 MG/3ML) 0.083% IN NEBU
10.0000 mg | INHALATION_SOLUTION | Freq: Once | RESPIRATORY_TRACT | Status: AC
  Administered 2024-04-25: 10 mg via RESPIRATORY_TRACT
  Filled 2024-04-25: qty 12

## 2024-04-25 MED ORDER — EPINEPHRINE HCL 5 MG/250ML IV SOLN IN NS: 0.5000 ug/min | INTRAVENOUS | Status: DC

## 2024-04-25 MED ORDER — ETOMIDATE 2 MG/ML IV SOLN
INTRAVENOUS | Status: AC
  Administered 2024-04-25: 10 mg
  Filled 2024-04-25: qty 20

## 2024-04-25 MED ORDER — EPINEPHRINE 1 MG/10ML IJ SOSY
PREFILLED_SYRINGE | INTRAMUSCULAR | Status: AC
  Filled 2024-04-25: qty 20

## 2024-04-25 MED ORDER — ONDANSETRON HCL 4 MG/2ML IJ SOLN: 4.0000 mg | Freq: Four times a day (QID) | INTRAMUSCULAR | Status: DC | PRN

## 2024-04-25 MED ORDER — INSULIN ASPART 100 UNIT/ML IJ SOLN: 0.0000 [IU] | INTRAMUSCULAR | Status: DC

## 2024-04-25 MED ORDER — INSULIN ASPART 100 UNIT/ML IV SOLN
5.0000 [IU] | Freq: Once | INTRAVENOUS | Status: AC
  Administered 2024-04-25: 5 [IU] via INTRAVENOUS

## 2024-04-25 MED ORDER — ROCURONIUM BROMIDE 10 MG/ML (PF) SYRINGE
PREFILLED_SYRINGE | INTRAVENOUS | Status: AC
  Administered 2024-04-25: 50 mg
  Filled 2024-04-25: qty 10

## 2024-04-25 MED ORDER — ALBUTEROL SULFATE (2.5 MG/3ML) 0.083% IN NEBU
INHALATION_SOLUTION | RESPIRATORY_TRACT | Status: AC
  Filled 2024-04-25: qty 12

## 2024-04-25 MED ORDER — PHENYLEPHRINE 80 MCG/ML (10ML) SYRINGE FOR IV PUSH (FOR BLOOD PRESSURE SUPPORT)
PREFILLED_SYRINGE | INTRAVENOUS | Status: AC
  Administered 2024-04-25: 400 ug
  Filled 2024-04-25: qty 10

## 2024-04-25 MED ORDER — FAMOTIDINE 20 MG PO TABS: 20.0000 mg | ORAL_TABLET | Freq: Two times a day (BID) | ORAL | Status: DC

## 2024-04-26 LAB — BASIC METABOLIC PANEL WITH GFR
Anion gap: 23 — ABNORMAL HIGH (ref 5–15)
BUN: 54 mg/dL — ABNORMAL HIGH (ref 8–23)
CO2: 15 mmol/L — ABNORMAL LOW (ref 22–32)
Calcium: 10.2 mg/dL (ref 8.9–10.3)
Chloride: 97 mmol/L — ABNORMAL LOW (ref 98–111)
Creatinine, Ser: 4.18 mg/dL — ABNORMAL HIGH (ref 0.44–1.00)
GFR, Estimated: 10 mL/min — ABNORMAL LOW (ref >60)
Glucose, Bld: 106 mg/dL — ABNORMAL HIGH (ref 70–99)
Potassium: 6.6 mmol/L (ref 3.5–5.1)
Sodium: 135 mmol/L (ref 135–145)

## 2024-05-13 NOTE — Progress Notes (Signed)
 Chaplain responded to Code Blue.  Pt was receiving resuscitation. There was no family in the room.    Alexis Anger Chaplain

## 2024-05-13 NOTE — Plan of Care (Addendum)
     Referral received for Marily Shows for goals of care discussion. Chart reviewed and went to the unit to support. Unfortunately patient had continued decline and has recently passed away. No family at bedside, no further needs.  Thank you for your referral and allowing PMT to assist in Sheryle Donning A Stebbins's care.   Lizbeth Right, NP Palliative Medicine Team Phone: [phone]  NO CHARGE

## 2024-05-13 NOTE — Progress Notes (Addendum)
 Notified this AM that the patient was having PPM failure overnight/this morning On our arrival to bedside pt is being Coded/CPR/ACLS in progress Initially in review of tele with AV pacing/capture though in review of her chart appears as her lab derangements continued to worsen she developed non-capture  PPM adjusted to  Trend OFF on the RV to allow output at [facility] and RA lead to 5V  AP/VP by EGMs  with pause of CPR, + pulse Bedside MD running code requested pacing rate be increased to 80bpm and was programmed to 80bpm  Mertha Abrahams, PA-C  ADDENDED to calrify Note was entered as a late entry Myself and Dr. Marven Slimmer were bedside during ~0720 Mertha Abrahams, PA-C

## 2024-05-13 NOTE — Progress Notes (Signed)
 Shortly after ROSC obtained, HR back into 20's, loss of capture despite maximum medical therapy with maximum doses of epi, NE, dopamine and s/p temporizing measures for hyperkalemia progressing back to PEA. Discussed with attending, further resuscitative efforts wold be futile and would cause further suffering and discomfort.  TOD [phone]  Son updated by Dr. Marene Shape on arrival.         Early Glisson, MSN, AG-ACNP-BC [facility] 05/02/2024, 11:11 AM  See Amion for pager If no response to pager , please call [phone] until 7pm After 7:00 pm call Elink  [phone]

## 2024-05-13 NOTE — H&P (Addendum)
 NAME:  Kirsten Jensen, MRN:  [id number], DOB:  08/04/1932, LOS: 5 ADMISSION DATE:  04/20/2024, CONSULTATION DATE:  04/21/2024 REFERRING MD: Vada Garibaldi, MD , CHIEF COMPLAINT:  Bradycardia   History of Present Illness:  89 y/o female with PMH for Paroxysmal A fib, sick sinus syndrome, s/p pacemaker chronically on Eliquis , Type 2 Diabetes, CAD s/p stenting, morbid obesity, HTN, HLD and CKD stage IIIB and she also is blind secondary to macular degeneration.  She represented to the hospital for what seems to be volume overload.  Over night I was consulted because she had 3 episodes of bradycardia to the 20's.  However, when I saw her she was more altered.  A blood gas was done and this was not impressive.  I had sent her to CT head and during this time she became more altered.  The patient was then admitted to ICU and intubated upon admission as she was not able to protect her airways.  The first episode of bradycardia a code blue was called although is is unclear that she lost her pulse and may have gotten 1 chest compression, she also received Atropine which she also received 2 more times when she became bradycardiac. Current Labs showing K+ at 6.6, Bicarb 15, Cr 4.18 and Lactic Acid 5.9. Seen by Cardiology this morning as well at bedside and her pacemaker will be interrogated. Pertinent  Medical History  Paroxysmal A fib, sick sinus syndrome, s/p pacemaker chronically on Eliquis , Type 2 Diabetes, CAD s/p stenting, morbid obesity, HTN, HLD and CKD stage IIIB and she also is blind secondary to macular degeneration.  Significant Hospital Events: Including procedures, antibiotic start and stop dates in addition to other pertinent events   5/14: admit to ICU and intubated  Interim History / Subjective:  N/a  Objective    Blood pressure (!) 86/49, pulse (!) 37, temperature 97.8 F (36.6 C), temperature source Axillary, resp. rate 20, height 5' 7.01" (1.702 m), weight 118.8 kg, SpO2 96%.    Vent Mode:  PRVC FiO2 (%):  [100 %] 100 % Set Rate:  [20 bmp] 20 bmp Vt Set:  [490 mL] 490 mL PEEP:  [5 cmH20] 5 cmH20   Intake/Output Summary (Last 24 hours) at 04/24/2024 0600 Last data filed at 04/24/2024 4098 Gross per 24 hour  Intake --  Output 100 ml  Net -100 ml   Filed Weights   04/20/24 2200 04/21/24 0527 04/23/24 0438  Weight: 118.7 kg 118.7 kg 118.8 kg    Examination: General: worsening mental status, moved spontaneously but not following commands HENT: non-reactive pupils, no icterus Lungs: CTA no wheezes no rales Cardiovascular: reg with irreg Abdomen: soft obese, nt nd bs pos Extremities: mild LE edema b/l Neuro: poorly responsive, not following commands GU: Foley  Resolved problem list   Assessment and Plan  Altered mental Status Unclear etiology-Metabolic Encephalopathy most likely CT head no acute findings and the gas in the orbit in a blind patient maybe a chronic finding and would not produce the current mental status Acute respiratory failure-hypoxic type Intubated on mech ventilation Follow ABG and adjust settings VAP prevention Wean trial when more alert Bradycardia (episodic to more sustained) Cardiology to interrogate the pacemaker, seems to be sensing but not always capturing Last echo showing EF 60% Rates currently 36 BPM on 5mcg Dopamine, may need to increase the dose She is also on Levophed at 10mcg peripherally (Central Line Pending) TSH normal Lactic and high Anion Gap Metabolic acidosis Low flow from the  bradycardia Plan to support her BP and heart rate Repeat potassium pending, iof high will give Lokelma via OGT AKI on chronic Monitor I's/O's Monitor serum Cr   Best Practice (right click and "Reselect all SmartList Selections" daily)   Diet/type: NPO w/ meds via tube DVT prophylaxis DOAC Pressure ulcer(s): N/A GI prophylaxis: H2B Lines: Central line Foley:  Yes, and it is still needed Code Status:  full code   Labs   CBC: Recent  Labs  Lab 04/20/24 1423 04/20/24 1430 04/21/24 0843 04/23/24 0324 04/24/24 0347  WBC 5.6  --  5.5 6.2 8.4  NEUTROABS 4.2  --   --   --   --   HGB 8.5* 9.2* 8.4* 8.3* 9.4*  HCT 27.6* 27.0* 26.5* 27.4* 30.4*  MCV 106.6*  --  104.3* 107.9* 105.9*  PLT 222  --  228 231 268    Basic Metabolic Panel: Recent Labs  Lab 04/21/24 1159 04/22/24 0250 04/23/24 0324 04/24/24 0347 04/24/24 2316  NA 136 138 136 136 135  K 3.4* 4.0 3.8 5.2* 6.6*  CL 99 99 100 101 97*  CO2 23 23 22  18* 15*  GLUCOSE 200* 202* 176* 163* 106*  BUN 42* 42* 41* 45* 54*  CREATININE 2.95* 3.03* 2.85* 3.19* 4.18*  CALCIUM 10.0 10.3 9.9 10.0 10.2  MG  --   --   --   --  2.7*   GFR: Estimated Creatinine Clearance: 12.2 mL/min (A) (by C-G formula based on SCr of 4.18 mg/dL (H)). Recent Labs  Lab 04/20/24 1423 04/21/24 0843 04/23/24 0324 04/24/24 0347 04/24/24 2316  WBC 5.6 5.5 6.2 8.4  --   LATICACIDVEN  --   --   --   --  5.9*    Liver Function Tests: Recent Labs  Lab 04/20/24 1423 04/24/24 0347  AST 62*  --   ALT 25  --   ALKPHOS 70  --   BILITOT 1.0  --   PROT 8.5*  --   ALBUMIN 2.9* 2.8*   No results for input(s): "LIPASE", "AMYLASE" in the last 168 hours. No results for input(s): "AMMONIA" in the last 168 hours.  ABG    Component Value Date/Time   PHART 7.31 (L) 05/12/2024 0357   PCO2ART 27 (L) 04/23/2024 0357   PO2ART 98 04/27/2024 0357   HCO3 13.8 (L) 04/24/2024 0357   TCO2 22 04/20/2024 1430   ACIDBASEDEF 11.1 (H) 04/13/2024 0357   O2SAT 98.3 04/15/2024 0357     Coagulation Profile: No results for input(s): "INR", "PROTIME" in the last 168 hours.  Cardiac Enzymes: No results for input(s): "CKTOTAL", "CKMB", "CKMBINDEX", "TROPONINI" in the last 168 hours.  HbA1C: Hgb A1c MFr Bld  Date/Time Value Ref Range Status  02/17/2021 03:56 PM 6.9 (H) 4.8 - 5.6 % Final    Comment:             Prediabetes: 5.7 - 6.4          Diabetes: >6.4          Glycemic control for adults with  diabetes: <7.0   10/31/2020 02:58 AM 7.9 (H) 4.8 - 5.6 % Final    Comment:    (NOTE) Pre diabetes:          5.7%-6.4%  Diabetes:              >6.4%  Glycemic control for   <7.0% adults with diabetes     CBG: Recent Labs  Lab 04/24/24 1101 04/24/24 1607 04/24/24 2103 05/01/2024  0226 05/08/2024 0521  GLUCAP 136* 128* 130* 123* 71    Review of Systems:   Unable to obtain, poor mental status  Past Medical History:  She,  has a past medical history of Atrial fibrillation (HCC), Coronary artery disease, Diabetes (HCC), Dyslipidemia (04/05/2020), Hypertension, Morbid obesity (HCC), Pacemaker, Secondary hypercoagulable state (HCC) (11/06/2019), T2DM (type 2 diabetes mellitus) (HCC) (04/05/2020), and Tachycardia-bradycardia syndrome (HCC) (03/17/2020).   Surgical History:   Past Surgical History:  Procedure Laterality Date   CARDIOVERSION N/A 10/10/2023   Procedure: CARDIOVERSION;  Surgeon: Hazle Lites, MD;  Location: [facility] INVASIVE CV LAB;  Service: Cardiovascular;  Laterality: N/A;   COLONOSCOPY N/A 11/02/2020   Procedure: COLONOSCOPY;  Surgeon: Ozell Blunt, MD;  Location[facility] ENDOSCOPY;  Service: Endoscopy;  Laterality: N/A;   ESOPHAGOGASTRODUODENOSCOPY N/A 11/01/2020   Procedure: ESOPHAGOGASTRODUODENOSCOPY (EGD);  Surgeon: Ozell Blunt, MD;  Location: [facility] ENDOSCOPY;  Service: Endoscopy;  Laterality: N/A;   TRANSESOPHAGEAL ECHOCARDIOGRAM (CATH LAB)  10/10/2023   Procedure: TRANSESOPHAGEAL ECHOCARDIOGRAM;  Surgeon: Hazle Lites, MD;  Location: [facility] INVASIVE CV LAB;  Service: Cardiovascular;;     Social History:   reports that she has never smoked. She has never used smokeless tobacco. She reports that she does not currently use alcohol. She reports that she does not use drugs.   Family History:  Her family history includes Breast cancer in her daughter; CAD in her father; Hypertension in her mother; Sleep apnea in her son; Stroke in her mother.   Allergies No Known Allergies    Home Medications  Prior to Admission medications   Medication Sig Start Date End Date Taking? Authorizing Provider  acetaminophen  (TYLENOL ) 500 MG tablet Take 500 mg by mouth every 6 (six) hours as needed for mild pain (pain score 1-3).   Yes [provider]  amiodarone  (PACERONE ) 200 MG tablet Take 1 tablet (200 mg total) by mouth daily. Patient taking differently: Take 200 mg by mouth in the morning. 11/22/23  Yes Debbie Fails, PA-C  apixaban  (ELIQUIS ) 2.5 MG TABS tablet Take 1 tablet (2.5 mg total) by mouth 2 (two) times daily. 03/19/24  Yes Debbie Fails, PA-C  ascorbic acid  (VITAMIN C) 500 MG tablet Take 1 tablet (500 mg total) by mouth daily. Patient taking differently: Take 500 mg by mouth in the morning. 03/27/21  Yes Claretta Croft, MD  buPROPion  (WELLBUTRIN ) 100 MG tablet Take 100 mg by mouth at bedtime.   Yes [provider]  calcium carbonate (OSCAL) 1500 (600 Ca) MG TABS tablet Take 600 mg of elemental calcium by mouth 2 (two) times daily.   Yes [provider]  cholecalciferol  (VITAMIN D3) 25 MCG (1000 UNIT) tablet Take 1,000 Units by mouth in the morning.   Yes [provider]  diltiazem  (CARDIZEM  SR) 120 MG 12 hr capsule Take 1 capsule (120 mg total) by mouth daily. Patient taking differently: Take 120 mg by mouth in the morning. 03/19/24  Yes Debbie Fails, PA-C  ferrous sulfate  325 (65 FE) MG tablet Take 1 tablet (325 mg total) by mouth daily with breakfast. Patient taking differently: Take 325 mg by mouth in the morning. 11/05/20 04/20/24 Yes Macdonald Savoy, MD  fluticasone (FLONASE) 50 MCG/ACT nasal spray Place 1 spray into both nostrils daily.   Yes [provider]  furosemide  (LASIX ) 40 MG tablet Take one tablet by mouth daily, except on Tuesdays and Thursdays, take 60 mg. Patient taking differently: Take 40 mg by mouth daily. Take one tablet by  mouth daily except on Tuesdays and Thursdays. 04/11/24  Yes Ollis,  Cody Das L, NP  FUROSEMIDE  PO Take 60 mg by mouth 2 (two) times a week. Tuesday and Thursday   Yes [provider]  gemfibrozil  (LOPID ) 600 MG tablet Take 600 mg by mouth 2 (two) times daily. 07/04/22  Yes [provider]  HYDROcodone -acetaminophen  (NORCO/VICODIN) 5-325 MG tablet Take 1 tablet by mouth every 8 (eight) hours as needed for moderate pain (pain score 4-6). 09/08/22  Yes [provider]  hydrocortisone cream 1 % Apply 1 Application topically as needed for itching.   Yes [provider]  JARDIANCE 10 MG TABS tablet Take 10 mg by mouth in the morning. 04/23/22  Yes [provider]  levothyroxine  (SYNTHROID ) 50 MCG tablet Take 50 mcg by mouth daily before breakfast.   Yes [provider]  magnesium  oxide (MAG-OX) 400 MG tablet Take 1 tablet (400 mg total) by mouth daily. Patient taking differently: Take 400 mg by mouth in the morning. 02/05/20  Yes Fenton, Clint R, PA  metFORMIN  (GLUCOPHAGE ) 500 MG tablet Take 500 mg by mouth in the morning.   Yes [provider]  metoprolol  succinate (TOPROL  XL) 25 MG 24 hr tablet Take 1 tablet (25 mg total) by mouth daily. Patient taking differently: Take 25 mg by mouth in the morning. 06/02/22  Yes Verona Goodwill, MD  Multiple Vitamins-Minerals (OCUVITE ADULT 50+ PO) Take 1 capsule by mouth in the morning.   Yes [provider]  pantoprazole  (PROTONIX ) 40 MG tablet Take 1 tablet (40 mg total) by mouth 2 (two) times daily before a meal. Patient taking differently: Take 40 mg by mouth in the morning. 11/05/20  Yes Macdonald Savoy, MD  polyethylene glycol (MIRALAX  / GLYCOLAX ) 17 g packet Take 17 g by mouth as needed for mild constipation or moderate constipation.   Yes [provider]  polyethylene glycol powder (GLYCOLAX /MIRALAX ) 17 GM/SCOOP powder Take 17 g by mouth in the morning.   Yes [provider]  pravastatin  (PRAVACHOL ) 80 MG tablet Take 80 mg by mouth at  bedtime.   Yes [provider]  tiZANidine (ZANAFLEX) 4 MG tablet Take 4 mg by mouth every 8 (eight) hours as needed for muscle spasms.   Yes [provider]  vitamin B-12 (CYANOCOBALAMIN ) 1000 MCG tablet Take 1,000 mcg by mouth in the morning.   Yes [provider]     Critical care time: 24   The patient is critically ill with multiple organ system failure and requires high complexity decision making for assessment and support, frequent evaluation and titration of therapies, advanced monitoring, review of radiographic studies and interpretation of complex data.   Critical Care Time devoted to patient care services, exclusive of separately billable procedures, described in this note is 45 minutes.   Claven Cumming, MD [facility] See Amion for pager  If no response to pager , please call [phone] until 7pm After 7:00 pm call Elink  [phone] 04/15/2024, 6:01 AM

## 2024-05-13 NOTE — Progress Notes (Signed)
 RN from a different unit arrived to 3M07, after family had left, with more patient belongings.  Belongings will be sent to morgue with body.

## 2024-05-13 NOTE — Progress Notes (Signed)
 Patient seen and evaluated today today again.  Patient was transferred to [facility] 07.  I spoke to the nurse practitioner who said she did not see pacemaker spike the patient bradycardia down to upper 30s and became hypotensive and needed external pacing and starting of norepinephrine.  Patient's mentation was poor needing intubation.  CT head showed gas in inferior medial right orbit combined soft tissue density 16 mm space-occupying mass. The stop the external pacing and I could see pacemaker spike AV spike as well as V paced but heart rate is now down to 36 bpm.  From earlier today chest x-ray showed stable pacemaker leads.  EKG at 138 this morning also showed AV dual paced at 60 bpm with prolonged AV conduction.  And pacemaker sensing was also noted on telemetry.    Currently patient is intubated and sedated in MICU, we stopped trance cutaneous pacing. Continue norepinephrine. Started on dopamine 5 mics.  Recheck her BMP, potassium, her potassium was treated with insulin  and glucose and Lasix  Further workup of lactic acidosis, encephalopathy per primary team  She has [employer] pacemaker and will check for pacemaker function threshold and sensitivity.   Her echo is from 04/21/2024 which showed EF 60 to 65% We will have EP consultation and as well

## 2024-05-13 NOTE — Progress Notes (Signed)
 eLink Physician-Brief Progress Note Patient Name: Kirsten Jensen DOB: 01/30/1932 MRN: [id number]   Date of Service  04/23/2024  HPI/Events of Note  Patient originally admitted to the medical floor with leg swelling and weakness, she recently had a pacemaker placed for symptomatic bradycardia in the context of a history of tachy-brady syndrome.She was transferred to the ICU earlier today with apparent failure of pacemaker to capture, severe symptomatic bradycardia, altered mental status, and urgent intubation  for airway protection.  eICU Interventions  New Patient Evaluation.        Milicent Acheampong U Andersen Iorio 04/22/2024, 5:51 AM

## 2024-05-13 NOTE — Progress Notes (Signed)
 Son, Portia Brittle called and notified of critical change in condition and need for family to come to hospital.  Portia Brittle is on his way and daughter is coming from [place] of state.        Early Glisson, MSN, AG-ACNP-BC [facility] 04/17/2024, 7:39 AM  See Amion for pager If no response to pager , please call [phone] until 7pm After 7:00 pm call Elink  [phone]

## 2024-05-13 NOTE — Progress Notes (Signed)
 PT Cancellation Note  Patient Details Name: Kirsten Jensen MRN: [id number] DOB: [birth year]   Cancelled Treatment:    Reason Eval/Treat Not Completed: (P) Medical issues which prohibited therapy (per chart review, pt not medically stable, also will need PT re-eval after transfer to ICU overnight once she is medically appropriate.) Will notify supervising PT.   [name] [name] 05/01/2024, 9:32 AM

## 2024-05-13 NOTE — Progress Notes (Signed)
 Notified by the Central telemetry patient HR on 30's, went to the patient room, Patient complain of unable to breath placed on NRB  Heart rate continued to drop to 27bpm, Staff alerted, Charge Nurse  and  Dr. [facility] Lavender at bedside, patient became minimally responsive. Atropine 1mg  iv given. Code blue initiated for more assistance then eventually cancelled since patient became more responsive and HR is 60bpm. CXR and lab draw and EKG done   At 0117 HR dropped to 30's again after patient was having  coughing spells. Placed on NRB. RR nurse notified and another dose of atropine 1mg  IV given. Lasiv 40mg  IV, D50 IV, albuterol  nebulization and novolog  5 units iv given. Repeat EKG done   At 0257 HR dropped to 30's, sbp on 70's, SOB. Placed on NRB, patient confused and  trying to dangle his leg on the edge of the bed. At 0400 seen and examined by Dr. Mason Sole, ABG and  CT brain recommended. Transported to for CT with the RR nurse, noted  HR remains to 40's and patient received 3rd dose of atropine 1mg  IV. At 0530 patient transferred to 3M07 for close monitoring.

## 2024-05-13 NOTE — Discharge Summary (Signed)
 Physician Discharge Summary  Patient ID: Kirsten Jensen MRN: [id number] DOB/AGE: June 01, 1932 89 y.o.  Admit date: 04/20/2024 Discharge date: 2024-04-29 Time of Death: 0746 Date of Death: Apr 29, 2024 Cause of Death: Cardiac Arrest Problem List Principal Problem:   CHF exacerbation (HCC) Active Problems:   Paroxysmal atrial fibrillation (HCC)   Tachycardia-bradycardia syndrome (HCC)   Pacemaker   HTN (hypertension)   Dyslipidemia   Morbid obesity with BMI of 45.0-49.9, adult (HCC)   Gastrointestinal hemorrhage   Acute renal failure superimposed on stage 3b chronic kidney disease (HCC)   Hypothyroidism   Acute on chronic diastolic heart failure (HCC)   Symptomatic bradycardia   Palliative care by specialist  HPI: 89 y/o female with  paroxysmal A-fib, sick sinus syndrome status post pacemaker chronically on Eliquis , type 2 diabetes on metformin , coronary artery disease status post stenting, morbid obesity, hypertension, hyperlipidemia and CKD stage IIIb, bilateral 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 She was admitted for volume overload secondary to Acute on Chronic Diastolic Hart Failure and AKI on CKD stage IIIb.  She was started on diuresis initially.  Nephrology was called for consult.  Hospital Course: Her diuresis was stopped as her BP became decreased.  She was started on Midodrine .  Her anemia being stable she was placed back on Eliquis  for chronic A fib. On 5/13 patient had a bath and she became unresponsive and had bradycardia. PCCM was consulted because she had 3 episodes of bradycardia to the 20's.  However, when I saw her she was more altered.  A blood gas was done and this was not impressive.  I had sent her to CT head and during this time she became more altered.  The patient was then admitted to ICU and intubated upon admission as she was not able to  protect her airways.  The first episode of bradycardia a code blue was called although is is unclear that she lost her pulse and may have gotten 1 chest compression, she also received Atropine  which she also received 2 more times when she became bradycardiac. Current Labs showing K+ at 6.6, Bicarb 15, Cr 4.18 and Lactic Acid 5.9. Seen by Cardiology this morning as well at bedside and her pacemaker will be interrogated. However, patient HR decreased further to the 20's and she lost her pulse.  She was in PEA and CPR started quickly. Patient had been having bradycardia into the 30's.  She continued to brady and was in the 05-10-2024 then lost her pulse at 710a.m. CPR started immediately.  The patient was in PEA arrest.  The patient received a total of 5 Bicarb pushes during the code.  The patient received at least 4 IVP Epinephrine . She had been on Epinephrine  drip at 5mcg and Dopamine  15mcg and Levophed  20mcg just prior and during the code blue. She also received an amp of D50 and Insulin  and 1 IVP Calcium  Gluconate. ROSC was received at 0724 a.m. However, patient then started to decline quickly again and shortly after ROSC obtained, HR back into 20's, loss of capture despite maximum medical therapy with maximum doses of epi, NE, dopamine  and s/p temporizing measures for hyperkalemia progressing back to PEA. Day attending, suggested further resuscitative efforts wold be futile and would cause further suffering and discomfort. she was pronounced deceased at 0746 a.m.  Cause of death Cardiac Arrest. Family had been updated throughout the night.  Labs at discharge Lab Results  Component Value Date   CREATININE 4.56 (H) 05/11/2024   BUN 56 (H) 05-11-2024   NA 139 05-11-2024   K 6.9 (HH) 05-11-2024   CL 99 11-May-2024   CO2 14 (L) 2024-05-11   Lab Results  Component Value Date   WBC 13.3 (H) 11-May-2024   HGB 9.4 (L) 05/11/2024   HCT 31.2 (L) 05/11/2024   MCV 109.9 (H) 05-11-2024   PLT 284 2024/05/11    Lab Results  Component Value Date   ALT 25 04/20/2024   AST 62 (H) 04/20/2024   ALKPHOS 70 04/20/2024   BILITOT 1.0 04/20/2024   Lab Results  Component Value Date   INR 2.0 (H) 10/31/2020   INR 1.2 04/06/2020   INR 2.1 (H) 04/05/2020    Current radiology studies No results found.  Disposition: Morgue     Allergies as of 05/11/2024   No Known Allergies      Medication List     ASK your doctor about these medications    acetaminophen  500 MG tablet Commonly known as: TYLENOL  Take 500 mg by mouth every 6 (six) hours as needed for mild pain (pain score 1-3).   buPROPion  100 MG tablet Commonly known as: WELLBUTRIN  Take 100 mg by mouth at bedtime.   calcium  carbonate 1500 (600 Ca) MG Tabs tablet Commonly known as: OSCAL Take 600 mg of elemental calcium  by mouth 2 (two) times daily.   cholecalciferol  25 MCG (1000 UNIT) tablet Commonly known as: VITAMIN D3 Take 1,000 Units by mouth in the morning.   cyanocobalamin  1000 MCG tablet Commonly known as: VITAMIN B12 Take 1,000 mcg by mouth in the morning.   ferrous sulfate  325 (65 FE) MG tablet Take 1 tablet (325 mg total) by mouth daily with breakfast.   fluticasone  50 MCG/ACT nasal spray Commonly known as: FLONASE  Place 1 spray into both nostrils daily.   FUROSEMIDE  PO Take 60 mg by mouth 2 (two) times a week. Tuesday and Thursday   gemfibrozil  600 MG tablet Commonly known as: LOPID  Take 600 mg by mouth 2 (two) times daily.   HYDROcodone -acetaminophen  5-325 MG tablet Commonly known as: NORCO/VICODIN Take 1 tablet by mouth every 8 (eight) hours as needed for moderate pain (pain score 4-6).   hydrocortisone  cream 1 % Apply 1 Application topically as needed for itching.   Jardiance 10 MG Tabs tablet Generic drug: empagliflozin Take 10 mg by mouth in the morning.   levothyroxine  50 MCG tablet Commonly known as: SYNTHROID  Take 50 mcg by mouth daily before breakfast.   metFORMIN  500 MG tablet Commonly  known as: GLUCOPHAGE  Take 500 mg by mouth in the morning.   OCUVITE ADULT 50+ PO Take 1 capsule by mouth in the morning.   polyethylene glycol powder 17 GM/SCOOP powder Commonly known as: GLYCOLAX /MIRALAX  Take 17 g by mouth in the morning.   polyethylene glycol 17 g packet Commonly known as: MIRALAX  / GLYCOLAX  Take 17 g by mouth as needed for mild constipation or moderate constipation.   pravastatin  80 MG tablet Commonly known as: PRAVACHOL  Take 80 mg by mouth at bedtime.   tiZANidine  4 MG tablet Commonly known as: ZANAFLEX  Take 4 mg by mouth every 8 (eight) hours as needed for muscle spasms.          Discharged Condition: Deceased  Time spent on discharge greater than 40 minutes.  Claven Cumming, MD 04/28/2024

## 2024-05-13 NOTE — Progress Notes (Signed)
 Patient with significant event overnight leading Code Blue activation due to unresponsiveness with associated bradycardia. After intervention requiring 1 compression, atropine and calcium gluconate ROSC was achieved and patient transferred to ICU. AV paced rhythm appreciated. Unfortunately patient mentation to protect airways remains poor and patient was intubated and mechanically ventilated.  Plan: -patient has now been transferred to critical care service -continue to follow cardiology service recommendations and further inputs -continue pressor support and dopamine  -will wait on further stabilization prior to resume patient's care again.  Justina Oman MD [phone]

## 2024-05-13 NOTE — Procedures (Cosign Needed Addendum)
 Central Venous Catheter Insertion Procedure Note  Kirsten Jensen  [id number]  17-Nov-1932  Date:04/23/2024  Time:6:55AM  Provider Performing:Jerrye Seebeck Melven Stable Alayne Hubert   Procedure: Insertion of Non-tunneled Central Venous Catheter(36556) with US  guidance (40981)   Indication(s) Medication administration and Difficult access  Consent Unable to obtain consent due to emergent nature of procedure.  Anesthesia Topical only with 1% lidocaine    Timeout Verified patient identification, verified procedure, site/side was marked, verified correct patient position, special equipment/implants available, medications/allergies/relevant history reviewed, required imaging and test results available.  Sterile Technique Maximal sterile technique including full sterile barrier drape, hand hygiene, sterile gown, sterile gloves, mask, hair covering, sterile ultrasound probe cover (if used).  Procedure Description Area of catheter insertion was cleaned with chlorhexidine  and draped in sterile fashion.  With real-time ultrasound guidance a central venous catheter was placed into the right femoral vein. Nonpulsatile blood flow and easy flushing noted in all ports.  The catheter was sutured in place and sterile dressing applied.    Complications/Tolerance None; patient tolerated the procedure well. Chest X-ray is ordered to verify placement for internal jugular or subclavian cannulation.   Chest x-ray is not ordered for femoral cannulation.  EBL Minimal  Specimen(s) None  [place], [place] [facility] 04/14/2024 7:05AM  Please see [email or web address] for pager details.  From 7A-7P if no response, please call [phone] After hours, please call ELink [phone]

## 2024-05-13 NOTE — Code Documentation (Signed)
 Code Blue Note  Patient had been having bradycardia into the 30's.  She continued to brady and was in the 20's then lost her pulse at 710a.m. CPR started immediately.  The patient was in PEA arrest.  The patient received a total of 5 Bicarb pushes during the code.  The patient received at least 4 IVP Epinephrine. She had been on Epinephrine drip at 5mcg and Dopamine 15mcg and Levophed 20mcg just prior and during the code blue. She also received an amp of D50 and Insulin  and 1 IVP Calcium Gluconate. ROSC was received at 0724 a.m. CC time 35 min

## 2024-05-13 NOTE — Progress Notes (Signed)
 Urgent paged by primary team reporting patient had no heart rate in 27 bpm and blood pressure in 60s soon after getting a bath and minimally responsive  Son was for pacemaker not working. I reviewed the telemetry I see pacemaker spikes as well as V paced she is AV paced rhythm Chest x-ray shows stable pacemaker leads. EKG shows dual paced rhythm  Patient awake alert, heart rate back up, blood pressure stable, but intermittently getting confused she is also getting treated for CHF. I do not suspect pacemaker malfunction although pacemaker interrogation can be done in the morning  Probably patient had a vagal episode  Continue current cardiac medications Cardiology team to follow in a.m.

## 2024-05-13 DEATH — deceased

## 2024-05-29 ENCOUNTER — Encounter

## 2024-05-30 MED FILL — Medication: Qty: 1 | Status: AC

## 2024-08-28 ENCOUNTER — Encounter

## 2024-11-27 ENCOUNTER — Encounter
